# Patient Record
Sex: Female | Born: 1970
Health system: Southern US, Community
[De-identification: ages and names within clinical notes are randomized; demographics above are authoritative.]

## PROBLEM LIST (undated history)

## (undated) DIAGNOSIS — E785 Hyperlipidemia, unspecified: Secondary | ICD-10-CM

## (undated) DIAGNOSIS — G8929 Other chronic pain: Secondary | ICD-10-CM

## (undated) DIAGNOSIS — R000 Tachycardia, unspecified: Secondary | ICD-10-CM

## (undated) DIAGNOSIS — M549 Dorsalgia, unspecified: Secondary | ICD-10-CM

## (undated) HISTORY — DX: Other chronic pain: G89.29

## (undated) HISTORY — DX: Dorsalgia, unspecified: M54.9

## (undated) HISTORY — DX: Hyperlipidemia, unspecified: E78.5

---

## 2001-10-23 HISTORY — PX: BREAST EXCISIONAL BIOPSY: SUR124

## 2003-05-03 ENCOUNTER — Inpatient Hospital Stay (HOSPITAL_COMMUNITY): Admission: AD | Admit: 2003-05-03 | Discharge: 2003-05-03 | Payer: Self-pay | Admitting: Obstetrics and Gynecology

## 2003-12-17 LAB — HM COLONOSCOPY: HM Colonoscopy: ABNORMAL

## 2006-05-31 ENCOUNTER — Ambulatory Visit: Payer: Self-pay | Admitting: Physical Medicine & Rehabilitation

## 2006-05-31 ENCOUNTER — Encounter
Admission: RE | Admit: 2006-05-31 | Discharge: 2006-08-29 | Payer: Self-pay | Admitting: Physical Medicine & Rehabilitation

## 2006-08-03 ENCOUNTER — Ambulatory Visit: Payer: Self-pay | Admitting: Physical Medicine & Rehabilitation

## 2006-08-31 ENCOUNTER — Encounter
Admission: RE | Admit: 2006-08-31 | Discharge: 2006-11-29 | Payer: Self-pay | Admitting: Physical Medicine & Rehabilitation

## 2006-08-31 ENCOUNTER — Ambulatory Visit: Payer: Self-pay | Admitting: Physical Medicine & Rehabilitation

## 2006-09-04 ENCOUNTER — Encounter
Admission: RE | Admit: 2006-09-04 | Discharge: 2006-10-03 | Payer: Self-pay | Admitting: Physical Medicine & Rehabilitation

## 2006-10-04 ENCOUNTER — Encounter
Admission: RE | Admit: 2006-10-04 | Discharge: 2006-11-02 | Payer: Self-pay | Admitting: Physical Medicine & Rehabilitation

## 2006-11-07 ENCOUNTER — Encounter
Admission: RE | Admit: 2006-11-07 | Discharge: 2007-02-05 | Payer: Self-pay | Admitting: Physical Medicine & Rehabilitation

## 2006-11-20 ENCOUNTER — Ambulatory Visit: Payer: Self-pay | Admitting: Physical Medicine & Rehabilitation

## 2007-06-05 ENCOUNTER — Ambulatory Visit (HOSPITAL_COMMUNITY): Payer: Self-pay | Admitting: Psychiatry

## 2007-06-13 ENCOUNTER — Ambulatory Visit (HOSPITAL_COMMUNITY): Payer: Self-pay | Admitting: Licensed Clinical Social Worker

## 2007-06-19 ENCOUNTER — Ambulatory Visit (HOSPITAL_COMMUNITY): Payer: Self-pay | Admitting: Psychiatry

## 2007-07-08 ENCOUNTER — Ambulatory Visit (HOSPITAL_COMMUNITY): Payer: Self-pay | Admitting: Licensed Clinical Social Worker

## 2007-07-22 ENCOUNTER — Ambulatory Visit (HOSPITAL_COMMUNITY): Payer: Self-pay | Admitting: Licensed Clinical Social Worker

## 2007-09-04 ENCOUNTER — Encounter: Admission: RE | Admit: 2007-09-04 | Discharge: 2007-09-04 | Payer: Self-pay | Admitting: Family Medicine

## 2008-10-22 ENCOUNTER — Encounter: Payer: Self-pay | Admitting: Family Medicine

## 2008-11-24 ENCOUNTER — Encounter: Payer: Self-pay | Admitting: Family Medicine

## 2008-11-25 ENCOUNTER — Ambulatory Visit: Payer: Self-pay | Admitting: Internal Medicine

## 2008-11-25 DIAGNOSIS — M25559 Pain in unspecified hip: Secondary | ICD-10-CM | POA: Insufficient documentation

## 2008-11-25 DIAGNOSIS — F17200 Nicotine dependence, unspecified, uncomplicated: Secondary | ICD-10-CM | POA: Insufficient documentation

## 2008-11-25 DIAGNOSIS — M5136 Other intervertebral disc degeneration, lumbar region: Secondary | ICD-10-CM | POA: Insufficient documentation

## 2008-11-25 DIAGNOSIS — M51369 Other intervertebral disc degeneration, lumbar region without mention of lumbar back pain or lower extremity pain: Secondary | ICD-10-CM | POA: Insufficient documentation

## 2008-11-27 DIAGNOSIS — E785 Hyperlipidemia, unspecified: Secondary | ICD-10-CM | POA: Insufficient documentation

## 2008-11-27 DIAGNOSIS — E119 Type 2 diabetes mellitus without complications: Secondary | ICD-10-CM | POA: Insufficient documentation

## 2008-12-03 ENCOUNTER — Telehealth: Payer: Self-pay | Admitting: Internal Medicine

## 2008-12-10 ENCOUNTER — Ambulatory Visit: Payer: Self-pay | Admitting: Family Medicine

## 2008-12-10 DIAGNOSIS — B351 Tinea unguium: Secondary | ICD-10-CM | POA: Insufficient documentation

## 2008-12-11 ENCOUNTER — Encounter: Payer: Self-pay | Admitting: Family Medicine

## 2008-12-11 ENCOUNTER — Encounter (INDEPENDENT_AMBULATORY_CARE_PROVIDER_SITE_OTHER): Payer: Self-pay | Admitting: *Deleted

## 2008-12-17 LAB — CONVERTED CEMR LAB

## 2008-12-18 ENCOUNTER — Telehealth (INDEPENDENT_AMBULATORY_CARE_PROVIDER_SITE_OTHER): Payer: Self-pay | Admitting: *Deleted

## 2008-12-24 ENCOUNTER — Encounter (INDEPENDENT_AMBULATORY_CARE_PROVIDER_SITE_OTHER): Payer: Self-pay | Admitting: *Deleted

## 2008-12-27 ENCOUNTER — Encounter: Admission: RE | Admit: 2008-12-27 | Discharge: 2008-12-27 | Payer: Self-pay | Admitting: Family Medicine

## 2008-12-28 ENCOUNTER — Telehealth (INDEPENDENT_AMBULATORY_CARE_PROVIDER_SITE_OTHER): Payer: Self-pay | Admitting: *Deleted

## 2009-01-01 ENCOUNTER — Ambulatory Visit: Payer: Self-pay | Admitting: Family Medicine

## 2009-01-05 ENCOUNTER — Encounter (INDEPENDENT_AMBULATORY_CARE_PROVIDER_SITE_OTHER): Payer: Self-pay | Admitting: *Deleted

## 2009-01-13 ENCOUNTER — Encounter (INDEPENDENT_AMBULATORY_CARE_PROVIDER_SITE_OTHER): Payer: Self-pay | Admitting: *Deleted

## 2009-02-03 ENCOUNTER — Other Ambulatory Visit: Admission: RE | Admit: 2009-02-03 | Discharge: 2009-02-03 | Payer: Self-pay | Admitting: Family Medicine

## 2009-02-03 ENCOUNTER — Ambulatory Visit: Payer: Self-pay | Admitting: Family Medicine

## 2009-02-03 ENCOUNTER — Encounter: Payer: Self-pay | Admitting: Family Medicine

## 2009-02-03 LAB — CONVERTED CEMR LAB
Alkaline Phosphatase: 45 units/L (ref 39–117)
Basophils Relative: 0.9 % (ref 0.0–3.0)
Bilirubin, Direct: 0.1 mg/dL (ref 0.0–0.3)
CO2: 28 meq/L (ref 19–32)
Creatinine, Ser: 0.8 mg/dL (ref 0.4–1.2)
Creatinine,U: 253.4 mg/dL
Direct LDL: 133.3 mg/dL
GFR calc Af Amer: 104 mL/min
GFR calc non Af Amer: 86 mL/min
HCT: 42.8 % (ref 36.0–46.0)
Hemoglobin: 14.6 g/dL (ref 12.0–15.0)
MCHC: 34.1 g/dL (ref 30.0–36.0)
MCV: 99.2 fL (ref 78.0–100.0)
Microalb, Ur: 1 mg/dL (ref 0.0–1.9)
Monocytes Absolute: 0.5 10*3/uL (ref 0.1–1.0)
Neutro Abs: 4.3 10*3/uL (ref 1.4–7.7)
Neutrophils Relative %: 64.2 % (ref 43.0–77.0)
RBC: 4.31 M/uL (ref 3.87–5.11)
RDW: 12.4 % (ref 11.5–14.6)
Sodium: 140 meq/L (ref 135–145)
Total Bilirubin: 0.7 mg/dL (ref 0.3–1.2)
Total CHOL/HDL Ratio: 3.4
Total Protein: 7.3 g/dL (ref 6.0–8.3)
Triglycerides: 68 mg/dL (ref 0–149)
VLDL: 14 mg/dL (ref 0–40)
WBC: 6.7 10*3/uL (ref 4.5–10.5)

## 2009-02-08 ENCOUNTER — Encounter (INDEPENDENT_AMBULATORY_CARE_PROVIDER_SITE_OTHER): Payer: Self-pay | Admitting: *Deleted

## 2009-02-11 ENCOUNTER — Encounter (INDEPENDENT_AMBULATORY_CARE_PROVIDER_SITE_OTHER): Payer: Self-pay | Admitting: *Deleted

## 2009-03-13 ENCOUNTER — Emergency Department (HOSPITAL_COMMUNITY): Admission: EM | Admit: 2009-03-13 | Discharge: 2009-03-13 | Payer: Self-pay | Admitting: Family Medicine

## 2009-03-15 ENCOUNTER — Ambulatory Visit: Payer: Self-pay | Admitting: Family Medicine

## 2009-03-15 DIAGNOSIS — IMO0001 Reserved for inherently not codable concepts without codable children: Secondary | ICD-10-CM | POA: Insufficient documentation

## 2009-03-17 ENCOUNTER — Encounter (INDEPENDENT_AMBULATORY_CARE_PROVIDER_SITE_OTHER): Payer: Self-pay | Admitting: *Deleted

## 2009-04-05 ENCOUNTER — Ambulatory Visit: Payer: Self-pay | Admitting: Family Medicine

## 2009-04-09 ENCOUNTER — Encounter: Payer: Self-pay | Admitting: Family Medicine

## 2009-04-21 ENCOUNTER — Telehealth (INDEPENDENT_AMBULATORY_CARE_PROVIDER_SITE_OTHER): Payer: Self-pay | Admitting: *Deleted

## 2009-04-21 ENCOUNTER — Emergency Department (HOSPITAL_COMMUNITY): Admission: EM | Admit: 2009-04-21 | Discharge: 2009-04-21 | Payer: Self-pay | Admitting: Family Medicine

## 2009-05-31 ENCOUNTER — Ambulatory Visit: Payer: Self-pay | Admitting: Family Medicine

## 2009-05-31 DIAGNOSIS — N943 Premenstrual tension syndrome: Secondary | ICD-10-CM | POA: Insufficient documentation

## 2009-06-14 ENCOUNTER — Ambulatory Visit: Payer: Self-pay | Admitting: Family Medicine

## 2009-06-17 LAB — CONVERTED CEMR LAB
Albumin: 4.1 g/dL (ref 3.5–5.2)
CO2: 27 meq/L (ref 19–32)
Calcium: 9.2 mg/dL (ref 8.4–10.5)
GFR calc non Af Amer: 80.32 mL/min (ref >60)
Hgb A1c MFr Bld: 5.7 % (ref 4.6–6.5)
Potassium: 4.7 meq/L (ref 3.5–5.1)
Sodium: 139 meq/L (ref 135–145)
Total Bilirubin: 0.6 mg/dL (ref 0.3–1.2)
Total CHOL/HDL Ratio: 3
Triglycerides: 66 mg/dL (ref 0.0–149.0)
VLDL: 13.2 mg/dL (ref 0.0–40.0)

## 2009-06-22 ENCOUNTER — Emergency Department (HOSPITAL_COMMUNITY): Admission: EM | Admit: 2009-06-22 | Discharge: 2009-06-22 | Payer: Self-pay | Admitting: Family Medicine

## 2009-06-22 ENCOUNTER — Telehealth (INDEPENDENT_AMBULATORY_CARE_PROVIDER_SITE_OTHER): Payer: Self-pay | Admitting: *Deleted

## 2009-07-13 ENCOUNTER — Telehealth: Payer: Self-pay | Admitting: Family Medicine

## 2009-07-26 ENCOUNTER — Telehealth: Payer: Self-pay | Admitting: Family Medicine

## 2009-07-28 ENCOUNTER — Telehealth (INDEPENDENT_AMBULATORY_CARE_PROVIDER_SITE_OTHER): Payer: Self-pay | Admitting: *Deleted

## 2009-08-02 ENCOUNTER — Ambulatory Visit: Payer: Self-pay | Admitting: Family Medicine

## 2009-08-24 ENCOUNTER — Encounter: Payer: Self-pay | Admitting: Family Medicine

## 2009-09-03 ENCOUNTER — Ambulatory Visit: Payer: Self-pay | Admitting: Family Medicine

## 2009-09-03 DIAGNOSIS — D219 Benign neoplasm of connective and other soft tissue, unspecified: Secondary | ICD-10-CM | POA: Insufficient documentation

## 2009-09-14 ENCOUNTER — Encounter: Payer: Self-pay | Admitting: Family Medicine

## 2009-09-22 ENCOUNTER — Telehealth: Payer: Self-pay | Admitting: Family Medicine

## 2009-09-22 DIAGNOSIS — H547 Unspecified visual loss: Secondary | ICD-10-CM | POA: Insufficient documentation

## 2009-09-24 ENCOUNTER — Encounter (INDEPENDENT_AMBULATORY_CARE_PROVIDER_SITE_OTHER): Payer: Self-pay | Admitting: *Deleted

## 2009-09-28 ENCOUNTER — Ambulatory Visit: Payer: Self-pay | Admitting: Family Medicine

## 2009-10-04 ENCOUNTER — Telehealth (INDEPENDENT_AMBULATORY_CARE_PROVIDER_SITE_OTHER): Payer: Self-pay | Admitting: *Deleted

## 2009-10-04 LAB — CONVERTED CEMR LAB
ALT: 11 units/L (ref 0–35)
AST: 16 units/L (ref 0–37)
Albumin: 3.8 g/dL (ref 3.5–5.2)
Alkaline Phosphatase: 44 units/L (ref 39–117)
BUN: 6 mg/dL (ref 6–23)
Bilirubin, Direct: 0 mg/dL (ref 0.0–0.3)
CO2: 25 meq/L (ref 19–32)
Calcium: 9.1 mg/dL (ref 8.4–10.5)
Cholesterol: 200 mg/dL (ref 0–200)
Creatinine, Ser: 1 mg/dL (ref 0.4–1.2)
GFR calc non Af Amer: 80.19 mL/min (ref >60)
HDL: 63.4 mg/dL (ref >39.00)
Potassium: 4.4 meq/L (ref 3.5–5.1)
Sodium: 140 meq/L (ref 135–145)
Total Protein: 7.1 g/dL (ref 6.0–8.3)
VLDL: 12.2 mg/dL (ref 0.0–40.0)

## 2009-11-01 ENCOUNTER — Encounter: Payer: Self-pay | Admitting: Family Medicine

## 2009-11-03 LAB — HM DIABETES EYE EXAM: HM Diabetic Eye Exam: NORMAL

## 2009-11-12 ENCOUNTER — Telehealth: Payer: Self-pay | Admitting: Family Medicine

## 2009-11-25 ENCOUNTER — Encounter: Payer: Self-pay | Admitting: Family Medicine

## 2009-11-30 ENCOUNTER — Encounter: Payer: Self-pay | Admitting: Family Medicine

## 2010-03-06 ENCOUNTER — Emergency Department (HOSPITAL_COMMUNITY): Admission: EM | Admit: 2010-03-06 | Discharge: 2010-03-06 | Payer: Self-pay | Admitting: Family Medicine

## 2010-03-10 ENCOUNTER — Ambulatory Visit: Payer: Self-pay | Admitting: Family Medicine

## 2010-03-11 LAB — CONVERTED CEMR LAB
ALT: 11 units/L (ref 0–35)
Albumin: 4 g/dL (ref 3.5–5.2)
Alkaline Phosphatase: 53 units/L (ref 39–117)
BUN: 8 mg/dL (ref 6–23)
Bilirubin, Direct: 0.1 mg/dL (ref 0.0–0.3)
CO2: 25 meq/L (ref 19–32)
Calcium: 9.3 mg/dL (ref 8.4–10.5)
Creatinine, Ser: 0.8 mg/dL (ref 0.4–1.2)
Glucose, Bld: 75 mg/dL (ref 70–99)
Microalb, Ur: 0.1 mg/dL (ref 0.0–1.9)
Triglycerides: 64 mg/dL (ref 0.0–149.0)

## 2010-03-21 ENCOUNTER — Emergency Department (HOSPITAL_COMMUNITY): Admission: EM | Admit: 2010-03-21 | Discharge: 2010-03-21 | Payer: Self-pay | Admitting: Family Medicine

## 2010-03-22 ENCOUNTER — Telehealth: Payer: Self-pay | Admitting: Family Medicine

## 2010-03-24 ENCOUNTER — Ambulatory Visit: Payer: Self-pay | Admitting: Family Medicine

## 2010-03-24 DIAGNOSIS — R350 Frequency of micturition: Secondary | ICD-10-CM | POA: Insufficient documentation

## 2010-03-24 DIAGNOSIS — B370 Candidal stomatitis: Secondary | ICD-10-CM | POA: Insufficient documentation

## 2010-03-24 DIAGNOSIS — J019 Acute sinusitis, unspecified: Secondary | ICD-10-CM | POA: Insufficient documentation

## 2010-03-24 LAB — CONVERTED CEMR LAB
Blood in Urine, dipstick: NEGATIVE
Ketones, urine, test strip: NEGATIVE
Nitrite: NEGATIVE
Protein, U semiquant: NEGATIVE
Urobilinogen, UA: 0.2
WBC Urine, dipstick: NEGATIVE
pH: 6.5

## 2010-05-31 ENCOUNTER — Ambulatory Visit: Payer: Self-pay | Admitting: Family Medicine

## 2010-05-31 DIAGNOSIS — R5383 Other fatigue: Secondary | ICD-10-CM

## 2010-05-31 DIAGNOSIS — R5381 Other malaise: Secondary | ICD-10-CM | POA: Insufficient documentation

## 2010-06-06 LAB — CONVERTED CEMR LAB
Basophils Absolute: 0.1 10*3/uL (ref 0.0–0.1)
Basophils Relative: 0.7 % (ref 0.0–3.0)
CO2: 22 meq/L (ref 19–32)
Chloride: 104 meq/L (ref 96–112)
Folate: 9.7 ng/mL
Free T4: 1.07 ng/dL (ref 0.60–1.60)
GFR calc non Af Amer: 81.78 mL/min (ref >60)
HCT: 42.5 % (ref 36.0–46.0)
Hemoglobin: 14.4 g/dL (ref 12.0–15.0)
Iron: 132 ug/dL (ref 42–145)
Monocytes Absolute: 0.4 10*3/uL (ref 0.1–1.0)
Platelets: 244 10*3/uL (ref 150.0–400.0)
RBC: 4.29 M/uL (ref 3.87–5.11)
Saturation Ratios: 42.3 % (ref 20.0–50.0)
Sodium: 139 meq/L (ref 135–145)
T3, Free: 2.8 pg/mL (ref 2.3–4.2)
TSH: 1.49 microintl units/mL (ref 0.35–5.50)
Vitamin B-12: 1281 pg/mL — ABNORMAL HIGH (ref 211–911)

## 2010-06-30 ENCOUNTER — Ambulatory Visit: Payer: Self-pay | Admitting: Family Medicine

## 2010-06-30 DIAGNOSIS — R142 Eructation: Secondary | ICD-10-CM

## 2010-06-30 DIAGNOSIS — R143 Flatulence: Secondary | ICD-10-CM

## 2010-06-30 DIAGNOSIS — R110 Nausea: Secondary | ICD-10-CM | POA: Insufficient documentation

## 2010-06-30 DIAGNOSIS — R141 Gas pain: Secondary | ICD-10-CM | POA: Insufficient documentation

## 2010-07-01 LAB — CONVERTED CEMR LAB
Albumin: 4.3 g/dL (ref 3.5–5.2)
Alkaline Phosphatase: 49 units/L (ref 39–117)
Basophils Relative: 0.2 % (ref 0.0–3.0)
Bilirubin, Direct: 0.1 mg/dL (ref 0.0–0.3)
Chloride: 107 meq/L (ref 96–112)
Creatinine, Ser: 0.8 mg/dL (ref 0.4–1.2)
Glucose, Bld: 89 mg/dL (ref 70–99)
HCT: 40.9 % (ref 36.0–46.0)
Hemoglobin: 13.9 g/dL (ref 12.0–15.0)
Lymphs Abs: 2.6 10*3/uL (ref 0.7–4.0)
Monocytes Relative: 5.3 % (ref 3.0–12.0)
Total Protein: 7.2 g/dL (ref 6.0–8.3)
WBC: 7.6 10*3/uL (ref 4.5–10.5)

## 2010-07-26 ENCOUNTER — Encounter: Payer: Self-pay | Admitting: Family Medicine

## 2010-08-12 ENCOUNTER — Encounter: Payer: Self-pay | Admitting: Family Medicine

## 2010-08-12 ENCOUNTER — Ambulatory Visit (HOSPITAL_COMMUNITY): Admission: RE | Admit: 2010-08-12 | Discharge: 2010-08-12 | Payer: Self-pay | Admitting: Gastroenterology

## 2010-08-16 ENCOUNTER — Encounter: Payer: Self-pay | Admitting: Family Medicine

## 2010-08-22 ENCOUNTER — Ambulatory Visit: Payer: Self-pay | Admitting: Family Medicine

## 2010-08-22 DIAGNOSIS — K449 Diaphragmatic hernia without obstruction or gangrene: Secondary | ICD-10-CM | POA: Insufficient documentation

## 2010-08-22 DIAGNOSIS — D126 Benign neoplasm of colon, unspecified: Secondary | ICD-10-CM | POA: Insufficient documentation

## 2010-08-22 DIAGNOSIS — R030 Elevated blood-pressure reading, without diagnosis of hypertension: Secondary | ICD-10-CM | POA: Insufficient documentation

## 2010-10-27 ENCOUNTER — Encounter: Payer: Self-pay | Admitting: Family Medicine

## 2010-10-27 ENCOUNTER — Ambulatory Visit
Admission: RE | Admit: 2010-10-27 | Discharge: 2010-10-27 | Payer: Self-pay | Source: Home / Self Care | Attending: Family Medicine | Admitting: Family Medicine

## 2010-10-31 ENCOUNTER — Other Ambulatory Visit
Admission: RE | Admit: 2010-10-31 | Discharge: 2010-10-31 | Payer: Self-pay | Source: Home / Self Care | Admitting: Family Medicine

## 2010-10-31 ENCOUNTER — Ambulatory Visit
Admission: RE | Admit: 2010-10-31 | Discharge: 2010-10-31 | Payer: Self-pay | Source: Home / Self Care | Attending: Family Medicine | Admitting: Family Medicine

## 2010-10-31 ENCOUNTER — Telehealth (INDEPENDENT_AMBULATORY_CARE_PROVIDER_SITE_OTHER): Payer: Self-pay | Admitting: *Deleted

## 2010-10-31 ENCOUNTER — Other Ambulatory Visit: Payer: Self-pay | Admitting: Family Medicine

## 2010-10-31 LAB — CBC WITH DIFFERENTIAL/PLATELET
Basophils Absolute: 0 10*3/uL (ref 0.0–0.1)
Basophils Relative: 0.3 % (ref 0.0–3.0)
Eosinophils Absolute: 0.2 10*3/uL (ref 0.0–0.7)
Eosinophils Relative: 2.3 % (ref 0.0–5.0)
HCT: 40.5 % (ref 36.0–46.0)
Hemoglobin: 13.6 g/dL (ref 12.0–15.0)
Lymphocytes Relative: 22.8 % (ref 12.0–46.0)
Lymphs Abs: 1.5 10*3/uL (ref 0.7–4.0)
MCHC: 33.6 g/dL (ref 30.0–36.0)
MCV: 98.8 fl (ref 78.0–100.0)
Monocytes Absolute: 0.3 10*3/uL (ref 0.1–1.0)
Monocytes Relative: 5.1 % (ref 3.0–12.0)
Neutro Abs: 4.6 10*3/uL (ref 1.4–7.7)
Neutrophils Relative %: 69.5 % (ref 43.0–77.0)
Platelets: 297 10*3/uL (ref 150.0–400.0)
RBC: 4.1 Mil/uL (ref 3.87–5.11)
RDW: 13.6 % (ref 11.5–14.6)
WBC: 6.6 10*3/uL (ref 4.5–10.5)

## 2010-10-31 LAB — LIPID PANEL
Cholesterol: 219 mg/dL — ABNORMAL HIGH (ref 0–200)
HDL: 59.4 mg/dL (ref >39.00)
Total CHOL/HDL Ratio: 4
Triglycerides: 62 mg/dL (ref 0.0–149.0)
VLDL: 12.4 mg/dL (ref 0.0–40.0)

## 2010-10-31 LAB — HEMOGLOBIN A1C: Hgb A1c MFr Bld: 6.2 % (ref 4.6–6.5)

## 2010-10-31 LAB — HEPATIC FUNCTION PANEL
ALT: 7 U/L (ref 0–35)
AST: 18 U/L (ref 0–37)
Albumin: 4 g/dL (ref 3.5–5.2)
Alkaline Phosphatase: 56 U/L (ref 39–117)
Bilirubin, Direct: 0.1 mg/dL (ref 0.0–0.3)
Total Bilirubin: 0.5 mg/dL (ref 0.3–1.2)
Total Protein: 7.3 g/dL (ref 6.0–8.3)

## 2010-10-31 LAB — BASIC METABOLIC PANEL
BUN: 8 mg/dL (ref 6–23)
CO2: 27 mEq/L (ref 19–32)
Calcium: 9.2 mg/dL (ref 8.4–10.5)
Chloride: 105 mEq/L (ref 96–112)
Creatinine, Ser: 0.9 mg/dL (ref 0.4–1.2)
GFR: 93.62 mL/min (ref >60.00)
Glucose, Bld: 98 mg/dL (ref 70–99)
Potassium: 4.9 mEq/L (ref 3.5–5.1)
Sodium: 140 mEq/L (ref 135–145)

## 2010-10-31 LAB — HM PAP SMEAR

## 2010-10-31 LAB — LDL CHOLESTEROL, DIRECT: Direct LDL: 153.8 mg/dL

## 2010-10-31 LAB — TSH: TSH: 1.16 u[IU]/mL (ref 0.35–5.50)

## 2010-10-31 LAB — HM DIABETES FOOT EXAM

## 2010-11-02 ENCOUNTER — Telehealth: Payer: Self-pay | Admitting: Family Medicine

## 2010-11-03 ENCOUNTER — Encounter: Payer: Self-pay | Admitting: Family Medicine

## 2010-11-08 ENCOUNTER — Encounter
Admission: RE | Admit: 2010-11-08 | Discharge: 2010-11-08 | Payer: Self-pay | Source: Home / Self Care | Attending: Family Medicine | Admitting: Family Medicine

## 2010-11-08 ENCOUNTER — Telehealth (INDEPENDENT_AMBULATORY_CARE_PROVIDER_SITE_OTHER): Payer: Self-pay | Admitting: *Deleted

## 2010-11-13 ENCOUNTER — Encounter: Payer: Self-pay | Admitting: Family Medicine

## 2010-11-18 ENCOUNTER — Telehealth: Payer: Self-pay | Admitting: Family Medicine

## 2010-11-22 NOTE — Assessment & Plan Note (Signed)
Summary: teeth hurting- pressure between eyes/cbs   Vital Signs:  Patient profile:   40 year old female Height:      67 inches Weight:      175 pounds Temp:     98.6 degrees F oral Pulse rate:   76 / minute BP sitting:   118 / 90  (left arm)  Vitals Entered By: Jeremy Johann CMA (May 31, 2010 8:29 AM) CC: pressure between eyes, teeth hurting, discuss mood, URI symptoms, Depressive symptoms, Headache   History of Present Illness:       This is a 40 year old woman who presents with URI symptoms.  The symptoms began 4-8 weeks ago.  Pt c/o sinus pressure and headache for about 6months since she started her job with [employer]--very dusty.  The patient complains of nasal congestion and clear nasal discharge, but denies purulent nasal discharge, sore throat, dry cough, productive cough, earache, and sick contacts.  The patient denies fever, low-grade fever (<100.5 degrees), fever of 100.5-103 degrees, fever of 103.1-104 degrees, fever to >104 degrees, stiff neck, dyspnea, wheezing, rash, vomiting, diarrhea, use of an antipyretic, and response to antipyretic.  The patient also reports headache.  The patient denies itchy watery eyes, itchy throat, sneezing, seasonal symptoms, response to antihistamine, muscle aches, and severe fatigue.  Risk factors for Strep sinusitis include tooth pain.  The patient denies the following risk factors for Strep sinusitis: unilateral facial pain, unilateral nasal discharge, poor response to decongestant, double sickening, Strep exposure, tender adenopathy, and absence of cough.  Pt is using veramyst and claritin.     Depressive symptoms      The patient also presents with Depressive symptoms.  The symptoms began 4 weeks ago.  Pt states symptoms start about2 weeks before period---she cries for no reason and then when period starts she is better but it never goes away.  Pt had an episode years ago 2003 and she was found to have a vita B12 def and then symptoms resolved.  The  patient reports depressed mood and hypersomnia, but denies loss of interest/pleasure, significant weight loss, significant weight gain, insomnia, psychomotor agitation, and psychomotor retardation.  The patient also reports fatigue or loss of energy.  The patient denies feelings of worthlessness, diminished concentration, indecisiveness, thoughts of death, thoughts of suicide, suicidal intent, and suicidal plans.  The patient reports the following psychosocial stressors: major life changes.  Patient's past history includes depression.  The patient denies abnormally elevated mood, abnormally irritable mood, decreased need for sleep, increased talkativeness, distractibility, flight of ideas, increased goal-directed activity, and inflated self-esteem/ grandiosity.  Pt states this is the best year of her life.  She has no reason to be depressed.    Current Medications (verified): 1)  Bugs Bunny Vitamins/minerals  Chew (Multiple Vitamins-Minerals) .... Take 1 Tablet By Mouth Once A Day 2)  Freestyle Test  Strp (Glucose Blood) .... Use To Test Blood Sugar Two Times A Day 3)  Freestyle Lancets  Misc (Lancets) .... Use To Test Blood Sugar Two Times A Day 4)  Vicodin Es 7.5-750 Mg Tabs (Hydrocodone-Acetaminophen) .Marland Kitchen.. 1 By Mouth Every 6 Hours As Needed 5)  Prilosec 20 Mg Cpdr (Omeprazole) .Marland Kitchen.. 1 By Mouth Once Daily 6)  Claritin 10 Mg Tabs (Loratadine) .Marland Kitchen.. 1 By Mouth Once Daily 7)  Veramyst 27.5 Mcg/spray Susp (Fluticasone Furoate) .... 2 Sprays Each Nostril Once Daily 8)  B Complex-B12  Tabs (B Complex Vitamins) .... Take 1 Tab Once Daily  Allergies (verified): No  Known Drug Allergies  Physical Exam  General:  Well-developed,well-nourished,in no acute distress; alert,appropriate and cooperative throughout examination Ears:  External ear exam shows no significant lesions or deformities.  Otoscopic examination reveals clear canals, tympanic membranes are intact bilaterally without bulging, retraction,  inflammation or discharge. Hearing is grossly normal bilaterally. Nose:  L frontal sinus tenderness, L maxillary sinus tenderness, R frontal sinus tenderness, and R maxillary sinus tenderness.   Mouth:  Oral mucosa and oropharynx without lesions or exudates.  Teeth in good repair. Neck:  No deformities, masses, or tenderness noted. Lungs:  Normal respiratory effort, chest expands symmetrically. Lungs are clear to auscultation, no crackles or wheezes. Heart:  Normal rate and regular rhythm. S1 and S2 normal without gallop, murmur, click, rub or other extra sounds. Psych:  Oriented X3, normally interactive, not anxious appearing, and depressed affect.     Impression & Recommendations:  Problem # 1:  PREMENSTRUAL DYSPHORIC SYNDROME (ICD-625.4)  Her updated medication list for this problem includes:    Vicodin Es 7.5-750 Mg Tabs (Hydrocodone-acetaminophen) .Marland Kitchen... 1 by mouth every 6 hours as needed  Orders: Venipuncture (16109) TLB-B12 + Folate Pnl (60454_09811-B14/NWG) TLB-BMP (Basic Metabolic Panel-BMET) (80048-METABOL) TLB-CBC Platelet - w/Differential (85025-CBCD) TLB-TSH (Thyroid Stimulating Hormone) (84443-TSH) TLB-IBC Pnl (Iron/FE;Transferrin) (83550-IBC) TLB-T3, Free (Triiodothyronine) (84481-T3FREE) TLB-T4 (Thyrox), Free [phone]) Specimen Handling (57846)  Headache diary reviewed.  Problem # 2:  SINUSITIS - ACUTE-NOS (ICD-461.9) ? viral--if symptoms worsen or persist--- consider abx vs CT  Her updated medication list for this problem includes:    Flonase 50 Mcg/act Susp (Fluticasone propionate) .Marland Kitchen... 2 sprays each nostril once daily  Instructed on treatment. Call if symptoms persist or worsen.   Complete Medication List: 1)  Bugs Bunny Vitamins/minerals Chew (Multiple vitamins-minerals) .... Take 1 tablet by mouth once a day 2)  Freestyle Test Strp (Glucose blood) .... Use to test blood sugar two times a day 3)  Freestyle Lancets Misc (Lancets) .... Use to test blood  sugar two times a day 4)  Vicodin Es 7.5-750 Mg Tabs (Hydrocodone-acetaminophen) .Marland Kitchen.. 1 by mouth every 6 hours as needed 5)  Prilosec 20 Mg Cpdr (Omeprazole) .Marland Kitchen.. 1 by mouth once daily 6)  Zyrtec Allergy 10 Mg Tabs (Cetirizine hcl) .Marland Kitchen.. 1 by mouth once daily 7)  Flonase 50 Mcg/act Susp (Fluticasone propionate) .... 2 sprays each nostril once daily 8)  B Complex-b12 Tabs (B complex vitamins) .... Take 1 tab once daily 9)  Prozac 10 Mg Caps (Fluoxetine hcl) .Marland Kitchen.. 1po once daily  Patient Instructions: 1)  Please schedule a follow-up appointment in 1 month.  Prescriptions: FLONASE 50 MCG/ACT SUSP (FLUTICASONE PROPIONATE) 2 sprays each nostril once daily  #1 x 3   Entered and Authorized by:   Loreen Freud DO   Signed by:   Loreen Freud DO on 05/31/2010   Method used:   Electronically to        [facility] (retail)       [address]       [place], [place]  [address]       Ph: [phone]       Fax: [phone]   RxID:   [id number] PROZAC 10 MG CAPS (FLUOXETINE HCL) 1po once daily  #30 x 2   Entered and Authorized by:   Loreen Freud DO   Signed by:   Loreen Freud DO on 05/31/2010   Method used:   Electronically to        [facility] (retail)  [address]       [place], [place]  [address]       Ph: [phone]       Fax: [phone]   RxID:   [id number]

## 2010-11-22 NOTE — Progress Notes (Signed)
Summary: refill med  Phone Note Call from Patient Call back at Work Phone [phone]   Caller: Patient Summary of Call: pt states that she had palpitation from the advair sample she was given. pt was seen in  [facility] yesterday and advise by physician there that chest discomfort she is experience is not related to her allergy. UC physician recommend rx prilosec. Pt would like refill of this med. Pt uses [facility]. MED removed 03-10-10.....Marland KitchenMarland KitchenFelecia Deloach CMA  Mar 22, 2010 9:39 AM    Follow-up for Phone Call        prilosec 10 mg #30  1 by mouth once daily 11 refills Follow-up by: Loreen Freud DO,  Mar 22, 2010 9:50 AM  Additional Follow-up for Phone Call Additional follow up Details #1::        left message that rx was sent in to [facility] Army Fossa CMA  Mar 22, 2010 9:54 AM     New/Updated Medications: PRILOSEC 10 MG CPDR (OMEPRAZOLE) 1 by mouth daily. Prescriptions: PRILOSEC 10 MG CPDR (OMEPRAZOLE) 1 by mouth daily.  #30 x 11   Entered by:   Army Fossa CMA   Authorized by:   Loreen Freud DO   Signed by:   Army Fossa CMA on 03/22/2010   Method used:   Electronically to        [facility] (retail)       [address]       [place], [place]  [id number]       Ph: [phone]       Fax: [phone]   RxID:   [id number]

## 2010-11-22 NOTE — Procedures (Signed)
Summary: EGD/[facility]  EGD/[facility]   Imported By: Lanelle Bal 08/26/2010 08:33:30  _____________________________________________________________________  External Attachment:    Type:   Image     Comment:   External Document

## 2010-11-22 NOTE — Progress Notes (Signed)
Summary: FAILED OPHTHALMOLOGY REFERRAL  Phone Note Outgoing Call   Call placed by: Magdalen Spatz [facility],  November 12, 2009 2:39 PM Call placed to: Specialist Summary of Call: FYI....Marland KitchenMarland KitchenIN REF TO OPHTHALMOLOGY REFERRAL, PT HAD AN APPT TO DR. BEVIS/[facility] ON 10-12-09 @ 2PM FOR DECREASED VISUAL ACUITY & DIABETES, THEY STATE PT NOSHOWED FOR HER APPT & DID NOT RSC'D.  PT IS NOW WANTING AN APPT TO OPHTH FOR LASIX SURG ONLY. Initial call taken by: Magdalen Spatz [facility],  November 12, 2009 2:41 PM  Follow-up for Phone Call        pt is to see ophtho for her diabetes--- at least every year---that is why I set up appt-----INS does not pay for lasik and she did not mention that to me. Follow-up by: Loreen Freud DO,  November 12, 2009 2:45 PM  Additional Follow-up for Phone Call Additional follow up Details #1::        I LM FOR PT TO CALL ME TO INFORM HER OF ABOVE Magdalen Spatz [facility]  November 12, 2009 3:49 PM  I S/W PT & INFORMED HER OF ALL ABOVE.  PT AGREED TO RSC'D APPT FOR EYE EXAM, BUT W/[facility], WHO SHE IS ALREADY ESTABLISHED WITH.  HER APPT IS 11-17-2009 @ 7:40AM W/DR [employer].  Magdalen Spatz [facility]  November 15, 2009 12:41 PM

## 2010-11-22 NOTE — Consult Note (Signed)
Summary: [facility]  [facility]   Imported By: Lanelle Bal 11/22/2009 11:47:17  _____________________________________________________________________  External Attachment:    Type:   Image     Comment:   External Document

## 2010-11-22 NOTE — Medication Information (Signed)
Summary: Formulary Letter Regarding Nicotrol/[facility]  Formulary Letter Regarding Nicotrol/[facility]   Imported By: Lanelle Bal 12/22/2009 13:35:03  _____________________________________________________________________  External Attachment:    Type:   Image     Comment:   External Document

## 2010-11-22 NOTE — Assessment & Plan Note (Signed)
Summary: has blood pressure issues///sph   Vital Signs:  Patient profile:   40 year old female Weight:      162.8 pounds BMI:     25.59 Temp:     98.3 degrees F oral BP sitting:   122 / 80  (left arm) Cuff size:   regular  Vitals Entered By: Almeta Monas CMA Duncan Dull) (August 22, 2010 11:17 AM) CC: bp check--says it was running high for a few days but she was not feeling well.   History of Present Illness: Pt here f/u bp-- It was running high with Dr Loreta Ave but she was not feeling well.  Pt doing better now. She was dx with polyps and hiatal hernia.  Pt is also going to see a surgeon because her GB has been acting up and Dr Loreta Ave has set her up with a Careers adviser.,  Current Medications (verified): 1)  Bugs Bunny Vitamins/minerals  Chew (Multiple Vitamins-Minerals) .... Take 1 Tablet By Mouth Once A Day 2)  Freestyle Test  Strp (Glucose Blood) .... Use To Test Blood Sugar Two Times A Day 3)  Freestyle Lancets  Misc (Lancets) .... Use To Test Blood Sugar Two Times A Day 4)  Vicodin Es 7.5-750 Mg Tabs (Hydrocodone-Acetaminophen) .Marland Kitchen.. 1 By Mouth Every 6 Hours As Needed 5)  Aciphex 20 Mg Tbec (Rabeprazole Sodium) .Marland Kitchen.. 1 By Mouth Once Daily 6)  Zyrtec Allergy 10 Mg Tabs (Cetirizine Hcl) .Marland Kitchen.. 1 By Mouth Once Daily 7)  Flonase 50 Mcg/act Susp (Fluticasone Propionate) .... 2 Sprays Each Nostril Once Daily 8)  B Complex-B12  Tabs (B Complex Vitamins) .... Take 1 Tab Once Daily 9)  Prozac 10 Mg Caps (Fluoxetine Hcl) .Marland Kitchen.. 1po Once Daily 10)  Promethazine Hcl 25 Mg Tabs (Promethazine Hcl) .Marland Kitchen.. 1 By Mouth Qid As Needed 11)  Align 4 Mg Caps (Probiotic Product) .Marland Kitchen.. 1 By Mouth Once Daily  Allergies (verified): No Known Drug Allergies  Past History:  Past Medical History: Last updated: 12/10/2008 Diabetes mellitus, type II Hyperlipidemia Chronic pain mgt -    Right sacroiliac arthropathy.   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 Family History Breast cancer 1st degree relative <50 Family History of Colon CA 1st degree relative <60 Family History Diabetes 1st degree relative  Social History: Reviewed history from 11/25/2008 and no changes required. Single Occupation:  Disabled from [employer] - back and leg pain Current Smoker Drug use-no Oringinally from [place] ([place])  Review of Systems      See HPI  Physical Exam  General:  Well-developed,well-nourished,in no acute distress; alert,appropriate and cooperative throughout examination Neck:  No deformities, masses, or tenderness noted. Lungs:  Normal respiratory effort, chest expands symmetrically. Lungs are clear to auscultation, no crackles or wheezes. Heart:  Normal rate and regular rhythm. S1 and S2 normal without gallop, murmur, click, rub or other extra sounds. Extremities:  No clubbing, cyanosis,  edema, or deformity noted with normal full range of motion of all joints.   Psych:  Cognition and judgment appear intact. Alert and cooperative with normal attention span and concentration. No apparent delusions, illusions, hallucinations   Impression & Recommendations:  Problem # 1:  ELEVATED BP READING WITHOUT DX HYPERTENSION (ICD-796.2)  BP today:  122/80 Prior BP: 126/90 (06/30/2010)  Labs Reviewed: Creat: 0.8 (06/30/2010) Chol: 192 (03/10/2010)   HDL: 61.00 (03/10/2010)   LDL: 118 (03/10/2010)   TG: 64.0 (03/10/2010)  Instructed in low sodium diet (DASH Handout) and behavior modification.    Complete Medication List: 1)  Bugs Bunny Vitamins/minerals Chew (Multiple vitamins-minerals) .... Take 1 tablet by mouth once a day 2)  Freestyle Test Strp (Glucose blood) .... Use to test blood sugar two times a day 3)  Freestyle Lancets Misc (Lancets) .... Use to test blood sugar two times a day 4)  Vicodin Es 7.5-750 Mg Tabs (Hydrocodone-acetaminophen) .Marland Kitchen.. 1 by mouth every 6 hours as needed 5)  Aciphex 20 Mg Tbec (Rabeprazole sodium) .Marland Kitchen.. 1 by mouth once daily 6)  Zyrtec Allergy 10 Mg Tabs (Cetirizine hcl) .Marland Kitchen.. 1 by mouth once daily 7)  Flonase 50 Mcg/act Susp (Fluticasone propionate) .... 2 sprays each nostril once daily 8)  B Complex-b12 Tabs (B complex vitamins) .... Take 1 tab once daily 9)  Prozac 10 Mg Caps (Fluoxetine hcl) .Marland Kitchen.. 1po once daily 10)  Promethazine Hcl 25 Mg Tabs (Promethazine hcl) .Marland Kitchen.. 1 by mouth qid as needed 11)  Align 4 Mg Caps (Probiotic product) .Marland Kitchen.. 1 by mouth once daily   Orders Added: 1)  Est. Patient Level III [16109]

## 2010-11-22 NOTE — Consult Note (Signed)
Summary: [facility]  [facility]   Imported By: Lanelle Bal 08/16/2010 12:05:48  _____________________________________________________________________  External Attachment:    Type:   Image     Comment:   External Document

## 2010-11-22 NOTE — Assessment & Plan Note (Signed)
Summary: cough/earache/cbs   Vital Signs:  Patient profile:   40 year old female Height:      67 inches Weight:      178 pounds BMI:     27.98 Temp:     98.3 degrees F oral BP sitting:   126 / 80  (left arm) Cuff size:   regular  Vitals Entered By: Army Fossa CMA (Mar 10, 2010 10:39 AM) CC: Pt c/o being very tired, chest congestion, ear pain. x 1 week. has been taking claritin, and ATB(unsure of  name) Pt is fasting, URI symptoms   History of Present Illness:       This is a 40 year old woman who presents with URI symptoms.  The symptoms began 1 week ago.  Pt here c/o congestion.  She went to [facility]--no abx given------ pt had z pack at home and started taken it.   She is better.  The patient complains of nasal congestion, purulent nasal discharge, sore throat, productive cough, earache, and sick contacts.  The patient denies fever, low-grade fever (<100.5 degrees), fever of 100.5-103 degrees, fever of 103.1-104 degrees, fever to >104 degrees, stiff neck, dyspnea, wheezing, rash, vomiting, diarrhea, use of an antipyretic, and response to antipyretic.  The patient denies itchy watery eyes, itchy throat, sneezing, seasonal symptoms, response to antihistamine, headache, muscle aches, and severe fatigue.  Risk factors for Strep sinusitis include tooth pain.  The patient denies the following risk factors for Strep sinusitis: unilateral facial pain, unilateral nasal discharge, poor response to decongestant, double sickening, Strep exposure, tender adenopathy, and absence of cough.    Preventive Screening-Counseling & Management  Alcohol-Tobacco     Alcohol drinks/day: 0     Alcohol type: beer,wine,liquor     Smoking Status: current     Smoking Cessation Counseling: yes     Smoke Cessation Stage: ready     Packs/Day: 1.0     Year Started: 1994     Pack years: 16     Passive Smoke Exposure: yes  Caffeine-Diet-Exercise     Caffeine use/day: 3     Caffeine Counseling: decrease  use of caffeine     Does Patient Exercise: yes     Type of exercise: gym     Exercise (avg: min/session): 30-60     Times/week: 3  Current Medications (verified): 1)  Bugs Bunny Vitamins/minerals  Chew (Multiple Vitamins-Minerals) .... Take 1 Tablet By Mouth Once A Day 2)  Freestyle Test  Strp (Glucose Blood) .... Use To Test Blood Sugar Two Times A Day 3)  Freestyle Lancets  Misc (Lancets) .... Use To Test Blood Sugar Two Times A Day 4)  Vicodin Es 7.5-750 Mg Tabs (Hydrocodone-Acetaminophen) .Marland Kitchen.. 1 By Mouth Every 6 Hours As Needed 5)  Nicotrol 10 Mg Inha (Nicotine) .... Up To 16 Cart. A Day 6)  Crestor 20 Mg Tabs (Rosuvastatin Calcium) .Marland Kitchen.. 1 By Mouth Daily. 7)  Zithromax Z-Pak 250 Mg Tabs (Azithromycin) 8)  Advair Diskus 250-50 Mcg/dose Aepb (Fluticasone-Salmeterol) .Marland Kitchen.. 1 Inh Two Times A Day  Allergies (verified): No Known Drug Allergies  Past History:  Past medical, surgical, family and social histories (including risk factors) reviewed for relevance to current acute and chronic problems.  Past Medical History: Reviewed history from 12/10/2008 and no changes required. Diabetes mellitus, type II Hyperlipidemia Chronic pain mgt -    Right sacroiliac arthropathy.   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 chronic back pain  Past Surgical History: Reviewed history from 12/10/2008 and no changes  required. Denies surgical history  Family History: Reviewed history from 12/10/2008 and no changes required. Family History Breast cancer 1st degree relative <50 Family History of Colon CA 1st degree relative <60 Family History Diabetes 1st degree relative  Social History: Reviewed history from 11/25/2008 and no changes required. Single Occupation:  Disabled from [employer] - back and leg pain Current Smoker Drug use-no Oringinally from [place] ([place])  Review of Systems      See HPI  Physical Exam  General:  Well-developed,well-nourished,in no acute distress;  alert,appropriate and cooperative throughout examination Lungs:  R decreased breath sounds and L decreased breath sounds.   Heart:  normal rate and no murmur.   Psych:  Cognition and judgment appear intact. Alert and cooperative with normal attention span and concentration. No apparent delusions, illusions, hallucinations   Impression & Recommendations:  Problem # 1:  BRONCHITIS- ACUTE (ICD-466.0)  Take antibiotics and other medications as directed. Encouraged to push clear liquids, get enough rest, and take acetaminophen as needed. To be seen in 5-7 days if no improvement, sooner if worse.  Her updated medication list for this problem includes:    Zithromax Z-pak 250 Mg Tabs (Azithromycin)    Advair Diskus 250-50 Mcg/dose Aepb (Fluticasone-salmeterol) .Marland Kitchen... 1 inh two times a day  Complete Medication List: 1)  Bugs Bunny Vitamins/minerals Chew (Multiple vitamins-minerals) .... Take 1 tablet by mouth once a day 2)  Freestyle Test Strp (Glucose blood) .... Use to test blood sugar two times a day 3)  Freestyle Lancets Misc (Lancets) .... Use to test blood sugar two times a day 4)  Vicodin Es 7.5-750 Mg Tabs (Hydrocodone-acetaminophen) .Marland Kitchen.. 1 by mouth every 6 hours as needed 5)  Nicotrol 10 Mg Inha (Nicotine) .... Up to 16 cart. a day 6)  Crestor 20 Mg Tabs (Rosuvastatin calcium) .Marland Kitchen.. 1 by mouth daily. 7)  Zithromax Z-pak 250 Mg Tabs (Azithromycin) 8)  Advair Diskus 250-50 Mcg/dose Aepb (Fluticasone-salmeterol) .Marland Kitchen.. 1 inh two times a day  Other Orders: Venipuncture (16967) TLB-Lipid Panel (80061-LIPID) TLB-BMP (Basic Metabolic Panel-BMET) (80048-METABOL) TLB-Hepatic/Liver Function Pnl (80076-HEPATIC) TLB-A1C / Hgb A1C (Glycohemoglobin) (83036-A1C) TLB-Microalbumin/Creat Ratio, Urine (82043-MALB)

## 2010-11-22 NOTE — Progress Notes (Signed)
Summary: med verification  Phone Note From Pharmacy Call back at [phone]   Summary of Call: Pharmacy called to verify that you want her to be on 10 mg of Prilosec. Please advise. Army Fossa CMA  Mar 22, 2010 11:51 AM   Follow-up for Phone Call        thats what she was on before according to our records--- double check with pt or with pharmacy Follow-up by: Loreen Freud DO,  Mar 22, 2010 12:01 PM  Additional Follow-up for Phone Call Additional follow up Details #1::        left message for pt to call back. Army Fossa CMA  Mar 22, 2010 12:31 PM     Additional Follow-up for Phone Call Additional follow up Details #2::    Patient called back and said she was on 20 mg of prilosec. Follow-up by: Harold Barban,  Mar 22, 2010 1:04 PM  Additional Follow-up for Phone Call Additional follow up Details #3:: Details for Additional Follow-up Action Taken: ok to change to 20 mg Additional Follow-up by: Loreen Freud DO,  Mar 22, 2010 1:10 PM  New/Updated Medications: PRILOSEC 20 MG CPDR (OMEPRAZOLE) 1 by mouth once daily Prescriptions: PRILOSEC 20 MG CPDR (OMEPRAZOLE) 1 by mouth once daily  #30 x 11   Entered and Authorized by:   Loreen Freud DO   Signed by:   Loreen Freud DO on 03/22/2010   Method used:   Electronically to        [facility] (retail)       [address]       [place], [place]  [id number]       Ph: [phone]       Fax: [phone]   RxID:   [id number]

## 2010-11-22 NOTE — Procedures (Signed)
Summary: Colonoscopy/[facility]  Colonoscopy/[facility]   Imported By: Lanelle Bal 08/26/2010 08:34:30  _____________________________________________________________________  External Attachment:    Type:   Image     Comment:   External Document

## 2010-11-22 NOTE — Letter (Signed)
Summary: [facility]  [facility]   Imported By: Lanelle Bal 12/01/2009 10:57:33  _____________________________________________________________________  External Attachment:    Type:   Image     Comment:   External Document  Appended Document: [facility]    Clinical Lists Changes  Observations: Added new observation of TDBOOSTDUE: 02/04/2019 (12/01/2009 14:03) Added new observation of HDLNXTDUE: 09/28/2014 (12/01/2009 14:03) Added new observation of LDLNXTDUE: 09/28/2014 (12/01/2009 14:03) Added new observation of COLONNXTDUE: 12/16/2013 (12/01/2009 14:03) Added new observation of PAP DUE: 02/03/2010 (12/01/2009 14:03) Added new observation of MAMMO DUE: 08/21/2009 (12/01/2009 14:03) Added new observation of DB FT EX DUE: 02/03/2010 (12/01/2009 14:03) Added new observation of HGBA1CNXTDUE: 12/27/2009 (12/01/2009 14:03) Added new observation of MICRALB DUE: 12/10/2009 (12/01/2009 14:03) Added new observation of CREATNXTDUE: 09/28/2010 (12/01/2009 14:03) Added new observation of POTASSIUMDUE: 09/28/2010 (12/01/2009 14:03)

## 2010-11-22 NOTE — Assessment & Plan Note (Signed)
Summary: bloating-gas/cbs   Vital Signs:  Patient profile:   40 year old female LMP:     06/09/2010 Weight:      170.0 pounds Temp:     98.5 degrees F oral Pulse rate:   92 / minute Pulse rhythm:   regular BP sitting:   126 / 90  (left arm)  Vitals Entered By: Almeta Monas CMA Duncan Dull) (June 30, 2010 2:09 PM) CC: c/o bloating, gas, nauseated, Abdominal Pain LMP (date): 06/09/2010 LMP - Character: normal Menarche (age onset years): 13   Menses interval (days): 28 Menstrual flow (days): 4 Enter LMP: 06/09/2010 Last PAP Result NEGATIVE FOR INTRAEPITHELIAL LESIONS OR MALIGNANCY.   History of Present Illness:       This is a 40 year old woman who presents with Abdominal Pain.  The symptoms began 4 weeks ago.  Pt here c/o loose stools and increased bloating and gas. Worse with spicy, acidic foods and carbonation.   .  The patient denies nausea, vomiting, diarrhea, constipation, melena, hematochezia, anorexia, and hematemesis.  The location of the pain is diffuse.  The pain is described as intermittent and cramping in quality.  The patient denies the following symptoms: fever, weight loss, dysuria, chest pain, jaundice, dark urine, missed menstrual period, and vaginal bleeding.  The pain is worse with food.  The pain is better with antacids.  Sometimes pain radiates to shoulderblades.  Preventive Screening-Counseling & Management      Sexual History:  same sex encounters.    Current Medications (verified): 1)  Bugs Bunny Vitamins/minerals  Chew (Multiple Vitamins-Minerals) .... Take 1 Tablet By Mouth Once A Day 2)  Freestyle Test  Strp (Glucose Blood) .... Use To Test Blood Sugar Two Times A Day 3)  Freestyle Lancets  Misc (Lancets) .... Use To Test Blood Sugar Two Times A Day 4)  Vicodin Es 7.5-750 Mg Tabs (Hydrocodone-Acetaminophen) .Marland Kitchen.. 1 By Mouth Every 6 Hours As Needed 5)  Prilosec 20 Mg Cpdr (Omeprazole) .Marland Kitchen.. 1 By Mouth Once Daily 6)  Zyrtec Allergy 10 Mg Tabs (Cetirizine  Hcl) .Marland Kitchen.. 1 By Mouth Once Daily 7)  Flonase 50 Mcg/act Susp (Fluticasone Propionate) .... 2 Sprays Each Nostril Once Daily 8)  B Complex-B12  Tabs (B Complex Vitamins) .... Take 1 Tab Once Daily 9)  Prozac 10 Mg Caps (Fluoxetine Hcl) .Marland Kitchen.. 1po Once Daily 10)  Promethazine Hcl 25 Mg Tabs (Promethazine Hcl) .Marland Kitchen.. 1 By Mouth Qid As Needed  Allergies (verified): No Known Drug Allergies  Past History:  Past medical, surgical, family and social histories (including risk factors) reviewed for relevance to current acute and chronic problems.  Past Medical History: Reviewed history from 12/10/2008 and no changes required. Diabetes mellitus, type II Hyperlipidemia Chronic pain mgt -    Right sacroiliac arthropathy.   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 chronic back pain  Past Surgical History: Reviewed history from 12/10/2008 and no changes required. Denies surgical history  Family History: Reviewed history from 12/10/2008 and no changes required. Family History Breast cancer 1st degree relative <50 Family History of Colon CA 1st degree relative <60 Family History Diabetes 1st degree relative  Social History: Reviewed history from 11/25/2008 and no changes required. Single Occupation:  Disabled from [employer] - back and leg pain Current Smoker Drug use-no Oringinally from [employer] ([place])Sexual History:  same sex encounters  Review of Systems      See HPI  Physical Exam  General:  Well-developed,well-nourished,in no acute distress; alert,appropriate and cooperative throughout examination Lungs:  Normal  respiratory effort, chest expands symmetrically. Lungs are clear to auscultation, no crackles or wheezes. Abdomen:  Bowel sounds positive,abdomen soft and non-tender without masses, organomegaly or hernias noted. Psych:  Oriented X3 and normally interactive.     Impression & Recommendations:  Problem # 1:  ABDOMINAL BLOATING (ICD-787.3)  Orders: Venipuncture  (16109) TLB-BMP (Basic Metabolic Panel-BMET) (80048-METABOL) TLB-CBC Platelet - w/Differential (85025-CBCD) TLB-Hepatic/Liver Function Pnl (80076-HEPATIC) TLB-TSH (Thyroid Stimulating Hormone) (84443-TSH) TLB-Amylase (82150-AMYL) TLB-Lipase (83690-LIPASE) Gastroenterology Referral (GI) Specimen Handling (60454)  Problem # 2:  NAUSEA (ICD-787.02)  Her updated medication list for this problem includes:    Zyrtec Allergy 10 Mg Tabs (Cetirizine hcl) .Marland Kitchen... 1 by mouth once daily    Promethazine Hcl 25 Mg Tabs (Promethazine hcl) .Marland Kitchen... 1 by mouth qid as needed  Orders: Gastroenterology Referral (GI) Specimen Handling (09811)  Discussed symptom control.   Complete Medication List: 1)  Bugs Bunny Vitamins/minerals Chew (Multiple vitamins-minerals) .... Take 1 tablet by mouth once a day 2)  Freestyle Test Strp (Glucose blood) .... Use to test blood sugar two times a day 3)  Freestyle Lancets Misc (Lancets) .... Use to test blood sugar two times a day 4)  Vicodin Es 7.5-750 Mg Tabs (Hydrocodone-acetaminophen) .Marland Kitchen.. 1 by mouth every 6 hours as needed 5)  Aciphex 20 Mg Tbec (Rabeprazole sodium) .Marland Kitchen.. 1 by mouth once daily 6)  Zyrtec Allergy 10 Mg Tabs (Cetirizine hcl) .Marland Kitchen.. 1 by mouth once daily 7)  Flonase 50 Mcg/act Susp (Fluticasone propionate) .... 2 sprays each nostril once daily 8)  B Complex-b12 Tabs (B complex vitamins) .... Take 1 tab once daily 9)  Prozac 10 Mg Caps (Fluoxetine hcl) .Marland Kitchen.. 1po once daily 10)  Promethazine Hcl 25 Mg Tabs (Promethazine hcl) .Marland Kitchen.. 1 by mouth qid as needed 11)  Align 4 Mg Caps (Probiotic product) .Marland Kitchen.. 1 by mouth once daily  Patient Instructions: 1)  aciphex 1 by mouth once daily  2)  align as directed 3)  f/u GI Prescriptions: PROMETHAZINE HCL 25 MG TABS (PROMETHAZINE HCL) 1 by mouth qid as needed  #30 x 0   Entered and Authorized by:   Loreen Freud DO   Signed by:   Loreen Freud DO on 06/30/2010   Method used:   Electronically to        [facility] (retail)       [address]       [place], [place]  [address]       Ph: [phone]       Fax: [phone]   RxID:   [id number]

## 2010-11-22 NOTE — Assessment & Plan Note (Signed)
Summary: frequent urination/cbs   Vital Signs:  Patient profile:   40 year old female Weight:      173 pounds Pulse rate:   88 / minute Pulse rhythm:   regular BP sitting:   124 / 86  (left arm) Cuff size:   regular  Vitals Entered By: Army Fossa CMA (March 24, 2010 1:33 PM) CC: Pt here for frequent urination and states her head feels cloudy., Dysuria, URI symptoms   History of Present Illness:  Dysuria      This is a 40 year old woman who presents with Dysuria.  The patient complains of urinary frequency, but denies burning with urination, urgency, hematuria, vaginal discharge, vaginal itching, vaginal sores, and penile discharge.  The patient denies the following associated symptoms: nausea, vomiting, fever, shaking chills, flank pain, abdominal pain, back pain, pelvic pain, and arthralgias.  Risk factors for urinary tract infection include diabetes.  The patient denies the following risk factors: prior antibiotics, immunosuppression, history of GU anomaly, history of pyelonephritis, pregnancy, history of STD, and analgesic abuse.         The patient also presents with URI symptoms.  The symptoms began 1 week ago.  Pt has been taking claritin or allegra.  The patient complains of nasal congestion, earache, and sick contacts.  The patient denies fever, low-grade fever (<100.5 degrees), fever of 100.5-103 degrees, fever of 103.1-104 degrees, fever to >104 degrees, stiff neck, dyspnea, wheezing, rash, vomiting, diarrhea, use of an antipyretic, and response to antipyretic.  The patient also reports headache.  The patient denies the following risk factors for Strep sinusitis: unilateral facial pain, unilateral nasal discharge, poor response to decongestant, double sickening, tooth pain, Strep exposure, tender adenopathy, and absence of cough.    Current Medications (verified): 1)  Bugs Bunny Vitamins/minerals  Chew (Multiple Vitamins-Minerals) .... Take 1 Tablet By Mouth Once A Day 2)   Freestyle Test  Strp (Glucose Blood) .... Use To Test Blood Sugar Two Times A Day 3)  Freestyle Lancets  Misc (Lancets) .... Use To Test Blood Sugar Two Times A Day 4)  Vicodin Es 7.5-750 Mg Tabs (Hydrocodone-Acetaminophen) .Marland Kitchen.. 1 By Mouth Every 6 Hours As Needed 5)  Nicotrol 10 Mg Inha (Nicotine) .... Up To 16 Cart. A Day 6)  Crestor 20 Mg Tabs (Rosuvastatin Calcium) .Marland Kitchen.. 1 1/2  By Mouth Daily. 7)  Prilosec 20 Mg Cpdr (Omeprazole) .Marland Kitchen.. 1 By Mouth Once Daily 8)  Nystatin 100000 Unit/ml Susp (Nystatin) .... 5 Ml Swish and Spit Qid 9)  Levaquin 500 Mg Tabs (Levofloxacin) .Marland Kitchen.. 1 By Mouth Once Daily 10)  Claritin 10 Mg Tabs (Loratadine) .Marland Kitchen.. 1 By Mouth Once Daily 11)  Fluconazole 150 Mg Tabs (Fluconazole) .Marland Kitchen.. 1 By Mouth Once Daily X1  ,  May Repeat in 3 Days As Needed 12)  Veramyst 27.5 Mcg/spray Susp (Fluticasone Furoate) .... 2 Sprays Each Nostril Once Daily  Allergies (verified): No Known Drug Allergies  Past History:  Past medical, surgical, family and social histories (including risk factors) reviewed for relevance to current acute and chronic problems.  Past Medical History: Reviewed history from 12/10/2008 and no changes required. Diabetes mellitus, type II Hyperlipidemia Chronic pain mgt -    Right sacroiliac arthropathy.   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 chronic back pain  Past Surgical History: Reviewed history from 12/10/2008 and no changes required. Denies surgical history  Family History: Reviewed history from 12/10/2008 and no changes required. Family History Breast cancer 1st degree relative <50 Family History of  Colon CA 1st degree relative <60 Family History Diabetes 1st degree relative  Social History: Reviewed history from 11/25/2008 and no changes required. Single Occupation:  Disabled from [employer] - back and leg pain Current Smoker Drug use-no Oringinally from [place]  Review of Systems      See HPI  Physical Exam  General:   Well-developed,well-nourished,in no acute distress; alert,appropriate and cooperative throughout examination Ears:  External ear exam shows no significant lesions or deformities.  Otoscopic examination reveals clear canals, tympanic membranes are intact bilaterally without bulging, retraction, inflammation or discharge. Hearing is grossly normal bilaterally. Nose:  External nasal examination shows no deformity or inflammation. Nasal mucosa are pink and moist without lesions or exudates. Mouth:  pharynx pink and moist, halitosis, and white plaque(s) on tongue. Neck:  No deformities, masses, or tenderness noted. Lungs:  Normal respiratory effort, chest expands symmetrically. Lungs are clear to auscultation, no crackles or wheezes. Heart:  normal rate and no murmur.   Abdomen:  Bowel sounds positive,abdomen soft and non-tender without masses, organomegaly or hernias noted. Psych:  Oriented X3 and normally interactive.     Impression & Recommendations:  Problem # 1:  URINARY FREQUENCY (ICD-788.41)  Orders: T-Culture, Urine ([address]-60454) UA Dipstick w/o Micro (manual) (09811)  Discussed use of medication.   Problem # 2:  SINUSITIS - ACUTE-NOS (ICD-461.9)  The following medications were removed from the medication list:    Zithromax Z-pak 250 Mg Tabs (Azithromycin) Her updated medication list for this problem includes:    Levaquin 500 Mg Tabs (Levofloxacin) .Marland Kitchen... 1 by mouth once daily    Veramyst 27.5 Mcg/spray Susp (Fluticasone furoate) .Marland Kitchen... 2 sprays each nostril once daily  Instructed on treatment. Call if symptoms persist or worsen.   Orders: UA Dipstick w/o Micro (manual) (91478)  Problem # 3:  THRUSH (ICD-112.0)  nystatin swish and spit  Orders: UA Dipstick w/o Micro (manual) (29562)  Complete Medication List: 1)  Bugs Bunny Vitamins/minerals Chew (Multiple vitamins-minerals) .... Take 1 tablet by mouth once a day 2)  Freestyle Test Strp (Glucose blood) .... Use to test  blood sugar two times a day 3)  Freestyle Lancets Misc (Lancets) .... Use to test blood sugar two times a day 4)  Vicodin Es 7.5-750 Mg Tabs (Hydrocodone-acetaminophen) .Marland Kitchen.. 1 by mouth every 6 hours as needed 5)  Nicotrol 10 Mg Inha (Nicotine) .... Up to 16 cart. a day 6)  Crestor 20 Mg Tabs (Rosuvastatin calcium) .Marland Kitchen.. 1 1/2  by mouth daily. 7)  Prilosec 20 Mg Cpdr (Omeprazole) .Marland Kitchen.. 1 by mouth once daily 8)  Nystatin 100000 Unit/ml Susp (Nystatin) .... 5 ml swish and spit qid 9)  Levaquin 500 Mg Tabs (Levofloxacin) .Marland Kitchen.. 1 by mouth once daily 10)  Claritin 10 Mg Tabs (Loratadine) .Marland Kitchen.. 1 by mouth once daily 11)  Fluconazole 150 Mg Tabs (Fluconazole) .Marland Kitchen.. 1 by mouth once daily x1  ,  may repeat in 3 days as needed 12)  Veramyst 27.5 Mcg/spray Susp (Fluticasone furoate) .... 2 sprays each nostril once daily Prescriptions: FLUCONAZOLE 150 MG TABS (FLUCONAZOLE) 1 by mouth once daily x1  ,  may repeat in 3 days as needed  #2 x 2   Entered and Authorized by:   Loreen Freud DO   Signed by:   Loreen Freud DO on 03/24/2010   Method used:   Electronically to        [facility] (retail)       [address]  [place], [place]  [address]       Ph: [phone]       Fax: [phone]   RxID:   [id number] LEVAQUIN 500 MG TABS (LEVOFLOXACIN) 1 by mouth once daily  #10 x 0   Entered and Authorized by:   Loreen Freud DO   Signed by:   Loreen Freud DO on 03/24/2010   Method used:   Electronically to        [facility] (retail)       [address]       [place], [place]  [address]       Ph: [phone]       Fax: [phone]   RxID:   [id number] NYSTATIN 100000 UNIT/ML SUSP (NYSTATIN) 5 ml swish and spit qid  #150 ml x 0   Entered and Authorized by:   Loreen Freud DO   Signed by:   Loreen Freud DO on 03/24/2010   Method used:   Electronically to        [facility] (retail)       [address]        [place], [place]  [address]       Ph: [phone]       Fax: [phone]   RxID:   [id number]   Laboratory Results   Urine Tests    Routine Urinalysis   Color: lt. yellow Appearance: Clear Glucose: negative   (Normal Range: Negative) Bilirubin: negative   (Normal Range: Negative) Ketone: negative   (Normal Range: Negative) Spec. Gravity: 1.020   (Normal Range: 1.003-1.035) Blood: negative   (Normal Range: Negative) pH: 6.5   (Normal Range: 5.0-8.0) Protein: negative   (Normal Range: Negative) Urobilinogen: 0.2   (Normal Range: 0-1) Nitrite: negative   (Normal Range: Negative) Leukocyte Esterace: negative   (Normal Range: Negative)    Comments: Army Fossa CMA  March 24, 2010 1:39 PM

## 2010-11-24 NOTE — Progress Notes (Signed)
Summary: PSYCH REFERRAL  Phone Note Other Incoming   Summary of Call: IN REFERENCE TO PSYCHOLOGY REFERRAL........Marland KitchenPER FAX FROM JENNIFER/[facility], PT INFORMED HER SHE CANNOT AFFORED TO DO AT THIS TIME, SHE WILL CALL [facility] BACK IF SHE CHANGES HER MIND AT A LATER TIME Magdalen Spatz [facility]  November 18, 2010 9:29 AM

## 2010-11-24 NOTE — Progress Notes (Signed)
Summary: Refill Request  Phone Note Refill Request Call back at [phone] Message from:  Pharmacy on October 31, 2010 8:44 AM  Refills Requested: Medication #1:  Crestor 20mg  tab, take 1&1/2 tablets daily   Dosage confirmed as above?Dosage Confirmed   Supply Requested: [facility]  Next Appointment Scheduled: 1.9.12 Initial call taken by: Harold Barban,  October 31, 2010 8:45 AM  Follow-up for Phone Call        RX sent in by Dr.Lowne this a.m Follow-up by: Almeta Monas CMA Duncan Dull),  October 31, 2010 4:48 PM

## 2010-11-24 NOTE — Assessment & Plan Note (Signed)
Summary: FOR SINUS//PH   Vital Signs:  Patient profile:   40 year old female Weight:      164.6 pounds O2 Sat:      100 % on Room air Temp:     97.9 degrees F oral Pulse rate:   115 / minute Pulse rhythm:   regular BP sitting:   112 / 62  (left arm) Cuff size:   regular  Vitals Entered By: Almeta Monas CMA (AAMA) (October 27, 2010 10:05 AM)  O2 Flow:  Room air CC: x23mo c/o sinus headache, jaw pain, ear pain and sinus pressure---symptoms are off and on, URI symptoms   History of Present Illness:       This is a 40 year old woman who presents with URI symptoms.  The symptoms began 2 months ago.  Pt here c/o dry mouth ,  R ear pain and sinus pressure on and off.  The patient complains of sick contacts, but denies nasal congestion, clear nasal discharge, purulent nasal discharge, sore throat, dry cough, productive cough, and earache.  The patient denies fever, low-grade fever (<100.5 degrees), fever of 100.5-103 degrees, fever of 103.1-104 degrees, fever to >104 degrees, stiff neck, dyspnea, wheezing, rash, vomiting, diarrhea, use of an antipyretic, and response to antipyretic.  The patient also reports headache.  The patient denies itchy watery eyes, itchy throat, sneezing, seasonal symptoms, response to antihistamine, muscle aches, and severe fatigue.  Risk factors for Strep sinusitis include unilateral facial pain.  The patient denies the following risk factors for Strep sinusitis: unilateral nasal discharge, poor response to decongestant, double sickening, tooth pain, Strep exposure, tender adenopathy, and absence of cough.    Current Medications (verified): 1)  Bugs Bunny Vitamins/minerals  Chew (Multiple Vitamins-Minerals) .... Take 1 Tablet By Mouth Once A Day 2)  Freestyle Test  Strp (Glucose Blood) .... Use To Test Blood Sugar Two Times A Day 3)  Freestyle Lancets  Misc (Lancets) .... Use To Test Blood Sugar Two Times A Day 4)  Vicodin Es 7.5-750 Mg Tabs (Hydrocodone-Acetaminophen)  .Marland Kitchen.. 1 By Mouth Every 6 Hours As Needed 5)  Flonase 50 Mcg/act Susp (Fluticasone Propionate) .... 2 Sprays Each Nostril Once Daily 6)  Dexilant 60 Mg Cpdr (Dexlansoprazole) .... By Mouth Once Daily 7)  Phillps Pearls Probiotic .Marland Kitchen.. 1 By Mouth Every Other Day 8)  Augmentin 875-125 Mg Tabs (Amoxicillin-Pot Clavulanate) .Marland Kitchen.. 1 By Mouth Two Times A Day 9)  Astepro 0.15 % Soln (Azelastine Hcl) .... 2 Sprays Each Nostril Once Daily  Allergies (verified): No Known Drug Allergies  Past History:  Past medical, surgical, family and social histories (including risk factors) reviewed for relevance to current acute and chronic problems.  Past Medical History: Reviewed history from 12/10/2008 and no changes required. Diabetes mellitus, type II Hyperlipidemia Chronic pain mgt -    Right sacroiliac arthropathy.   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 chronic back pain  Past Surgical History: Reviewed history from 12/10/2008 and no changes required. Denies surgical history  Family History: Reviewed history from 12/10/2008 and no changes required. Family History Breast cancer 1st degree relative <50 Family History of Colon CA 1st degree relative <60 Family History Diabetes 1st degree relative  Social History: Reviewed history from 11/25/2008 and no changes required. Single Occupation:  Disabled from [employer] - back and leg pain Current Smoker Drug use-no Oringinally from [place]  Review of Systems      See HPI  Physical Exam  General:  Well-developed,well-nourished,in no acute distress; alert,appropriate  and cooperative throughout examination Ears:  External ear exam shows no significant lesions or deformities.  Otoscopic examination reveals clear canals, tympanic membranes are intact bilaterally without bulging, retraction, inflammation or discharge. Hearing is grossly normal bilaterally. Nose:  R frontal sinus tenderness and R maxillary sinus tenderness.   Mouth:  Oral  mucosa and oropharynx without lesions or exudates.  Teeth in good repair. Neck:  No deformities, masses, or tenderness noted. Lungs:  Normal respiratory effort, chest expands symmetrically. Lungs are clear to auscultation, no crackles or wheezes. Heart:  normal rate and no murmur.   Extremities:  No clubbing, cyanosis, edema, or deformity noted with normal full range of motion of all joints.     Impression & Recommendations:  Problem # 1:  SINUSITIS - ACUTE-NOS (ICD-461.9)  Her updated medication list for this problem includes:    Flonase 50 Mcg/act Susp (Fluticasone propionate) .Marland Kitchen... 2 sprays each nostril once daily    Augmentin 875-125 Mg Tabs (Amoxicillin-pot clavulanate) .Marland Kitchen... 1 by mouth two times a day    Astepro 0.15 % Soln (Azelastine hcl) .Marland Kitchen... 2 sprays each nostril once daily  Instructed on treatment. Call if symptoms persist or worsen.   Complete Medication List: 1)  Bugs Bunny Vitamins/minerals Chew (Multiple vitamins-minerals) .... Take 1 tablet by mouth once a day 2)  Freestyle Test Strp (Glucose blood) .... Use to test blood sugar two times a day 3)  Freestyle Lancets Misc (Lancets) .... Use to test blood sugar two times a day 4)  Vicodin Es 7.5-750 Mg Tabs (Hydrocodone-acetaminophen) .Marland Kitchen.. 1 by mouth every 6 hours as needed 5)  Flonase 50 Mcg/act Susp (Fluticasone propionate) .... 2 sprays each nostril once daily 6)  Dexilant 60 Mg Cpdr (Dexlansoprazole) .... By mouth once daily 7)  Phillps Pearls Probiotic  .Marland Kitchen.. 1 by mouth every other day 8)  Augmentin 875-125 Mg Tabs (Amoxicillin-pot clavulanate) .Marland Kitchen.. 1 by mouth two times a day 9)  Astepro 0.15 % Soln (Azelastine hcl) .... 2 sprays each nostril once daily Prescriptions: AUGMENTIN 875-125 MG TABS (AMOXICILLIN-POT CLAVULANATE) 1 by mouth two times a day  #20 x 0   Entered and Authorized by:   Loreen Freud DO   Signed by:   Loreen Freud DO on 10/27/2010   Method used:   Electronically to        [facility] (retail)       [address]       [place], [place]  [address]       Ph: [phone]       Fax: [phone]   RxID:   [id number]    Orders Added: 1)  Est. Patient Level III [24401]

## 2010-11-24 NOTE — Assessment & Plan Note (Signed)
Summary: cpx//pap//pt will be fasting//lch   Vital Signs:  Patient profile:   40 year old female Menstrual status:  regular LMP:     10/25/2010 Height:      67 inches Weight:      163.6 pounds Temp:     97.7 degrees F oral BP sitting:   124 / 80  (right arm) Cuff size:   regular  Vitals Entered By: Almeta Monas CMA Duncan Dull) (October 31, 2010 9:24 AM) CC: CPX/Fasting--No problems LMP (date): 10/25/2010 LMP - Character: normal Menarche (age onset years): 13   Menses interval (days): 28 Menstrual flow (days): 4 Menstrual Status regular Enter LMP: 10/25/2010 Last PAP Result NEGATIVE FOR INTRAEPITHELIAL LESIONS OR MALIGNANCY.   History of Present Illness: Pt here for cpe , labs and pap.   No complaints except she is having diarrhea with augmentin.     Preventive Screening-Counseling & Management  Alcohol-Tobacco     Alcohol drinks/day: 0     Alcohol type: beer,wine,liquor     Smoking Status: current     Smoking Cessation Counseling: yes     Smoke Cessation Stage: ready     Packs/Day: 1.0     Year Started: 1994     Pack years: 16     Passive Smoke Exposure: yes  Caffeine-Diet-Exercise     Caffeine use/day: 3     Caffeine Counseling: decrease use of caffeine     Does Patient Exercise: no     Exercise Counseling: to improve exercise regimen  Hep-HIV-STD-Contraception     Hepatitis Risk: risk noted     HIV Risk: risk noted     STD Risk: risk noted     Dental Visit-last 6 months yes     Dental Care Counseling: to seek dental care; no dental care within six months     Sun Exposure-Excessive: frequently  Safety-Violence-Falls     Seat Belt Use: 100      Sexual History:  same sex encounters.        Drug Use:  never.        Blood Transfusions:  no.    Problems Prior to Update: 1)  Elevated Bp Reading Without Dx Hypertension  (ICD-796.2) 2)  Colonic Polyps  (ICD-211.3) 3)  Hiatal Hernia  (ICD-553.3) 4)  Nausea  (ICD-787.02) 5)  Abdominal Bloating   (ICD-787.3) 6)  Sinusitis - Acute-nos  (ICD-461.9) 7)  Fatigue  (ICD-780.79) 8)  Thrush  (ICD-112.0) 9)  Sinusitis - Acute-nos  (ICD-461.9) 10)  Urinary Frequency  (ICD-788.41) 11)  Visual Acuity, Decreased  (ICD-369.9) 12)  Fibroids, Uterus  (ICD-218.9) 13)  Premenstrual Dysphoric Syndrome  (ICD-625.4) 14)  Myalgia  (ICD-729.1) 15)  Onychomycosis  (ICD-110.1) 16)  Preventive Health Care  (ICD-V70.0) 17)  Family History Diabetes 1st Degree Relative  (ICD-V18.0) 18)  Family History of Colon Ca 1st Degree Relative <60  (ICD-V16.0) 19)  Family History Breast Cancer 1st Degree Relative <50  (ICD-V16.3) 20)  Low Back Pain, Chronic  (ICD-724.2) 21)  Tobacco Abuse  (ICD-305.1) 22)  Hyperlipidemia  (ICD-272.4) 23)  Diabetes Mellitus, Type II  (ICD-250.00) 24)  Hip Pain, Right  (ICD-719.45) 25)  Back Pain, Lumbar, Chronic  (ICD-724.2)  Medications Prior to Update: 1)  Bugs Bunny Vitamins/minerals  Chew (Multiple Vitamins-Minerals) .... Take 1 Tablet By Mouth Once A Day 2)  Freestyle Test  Strp (Glucose Blood) .... Use To Test Blood Sugar Two Times A Day 3)  Freestyle Lancets  Misc (Lancets) .... Use To Test Blood Sugar  Two Times A Day 4)  Vicodin Es 7.5-750 Mg Tabs (Hydrocodone-Acetaminophen) .Marland Kitchen.. 1 By Mouth Every 6 Hours As Needed 5)  Flonase 50 Mcg/act Susp (Fluticasone Propionate) .... 2 Sprays Each Nostril Once Daily 6)  Dexilant 60 Mg Cpdr (Dexlansoprazole) .... By Mouth Once Daily 7)  Phillps Pearls Probiotic .Marland Kitchen.. 1 By Mouth Every Other Day 8)  Augmentin 875-125 Mg Tabs (Amoxicillin-Pot Clavulanate) .Marland Kitchen.. 1 By Mouth Two Times A Day 9)  Astepro 0.15 % Soln (Azelastine Hcl) .... 2 Sprays Each Nostril Once Daily  Current Medications (verified): 1)  Bugs Bunny Vitamins/minerals  Chew (Multiple Vitamins-Minerals) .... Take 1 Tablet By Mouth Once A Day 2)  Freestyle Test  Strp (Glucose Blood) .... Use To Test Blood Sugar Two Times A Day 3)  Freestyle Lancets  Misc (Lancets) .... Use To Test  Blood Sugar Two Times A Day 4)  Vicodin Es 7.5-750 Mg Tabs (Hydrocodone-Acetaminophen) .Marland Kitchen.. 1 By Mouth Every 6 Hours As Needed 5)  Flonase 50 Mcg/act Susp (Fluticasone Propionate) .... 2 Sprays Each Nostril Once Daily 6)  Dexilant 60 Mg Cpdr (Dexlansoprazole) .... By Mouth Once Daily 7)  Phillps Pearls Probiotic .Marland Kitchen.. 1 By Mouth Every Other Day 8)  Augmentin 875-125 Mg Tabs (Amoxicillin-Pot Clavulanate) .Marland Kitchen.. 1 By Mouth Two Times A Day 9)  Astepro 0.15 % Soln (Azelastine Hcl) .... 2 Sprays Each Nostril Once Daily 10)  Ceftin 500 Mg Tabs (Cefuroxime Axetil) .Marland Kitchen.. 1 By Mouth Two Times A Day 11)  Crestor 20 Mg Tabs (Rosuvastatin Calcium) .Marland Kitchen.. 1 1/2 Tab By Mouth At Bedtime  Allergies (verified): No Known Drug Allergies  Past History:  Past Medical History: Last updated: 12/10/2008 Diabetes mellitus, type II Hyperlipidemia Chronic pain mgt -    Right sacroiliac arthropathy.   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 Family History Breast cancer 1st degree relative <50 Family History of Colon CA 1st degree relative <60 Family History Diabetes 1st degree relative  Social History: Reviewed history from 11/25/2008 and no changes required. Single Occupation:   Disabled from [employer] - back and leg pain Current Smoker Drug use-no Oringinally from [employer] ([place])Does Patient Exercise:  no Dental Care w/in 6 mos.:  yes  Review of Systems      See HPI General:  Denies chills, fatigue, fever, loss of appetite, malaise, sleep disorder, sweats, weakness, and weight loss. Eyes:  Denies blurring, discharge, double vision, eye irritation, eye pain, halos, itching, light sensitivity, red eye, vision loss-1 eye, and vision loss-both eyes; optho q1y. ENT:  Denies decreased hearing, difficulty swallowing, ear discharge, earache, hoarseness, nasal congestion, nosebleeds, postnasal drainage, ringing in ears, sinus pressure, and sore throat. CV:  Denies bluish discoloration of lips or nails, chest pain or discomfort, difficulty breathing at night, difficulty breathing while lying down, fainting, fatigue, leg cramps with exertion, lightheadness, near fainting, palpitations, shortness of breath with exertion, swelling of feet, swelling of hands, and  weight gain. Resp:  Denies chest discomfort, chest pain with inspiration, cough, coughing up blood, excessive snoring, hypersomnolence, morning headaches, pleuritic, shortness of breath, sputum productive, and wheezing. GI:  Denies abdominal pain, bloody stools, change in bowel habits, constipation, dark tarry stools, diarrhea, excessive appetite, gas, hemorrhoids, indigestion, loss of appetite, nausea, vomiting, vomiting blood, and yellowish skin color. GU:  Denies abnormal vaginal bleeding, decreased libido, discharge, dysuria, genital sores, hematuria, incontinence, nocturia, urinary frequency, and urinary hesitancy. MS:  Denies joint pain, joint redness, joint swelling, loss of strength, low back pain, mid back pain, muscle aches, muscle , cramps, muscle weakness, stiffness, and thoracic pain. Derm:  Denies changes in color of skin, changes in nail beds, dryness, excessive perspiration, flushing, hair loss, insect  bite(s), itching, lesion(s), poor wound healing, and rash. Neuro:  Denies brief paralysis, difficulty with concentration, disturbances in coordination, falling down, headaches, inability to speak, memory loss, numbness, poor balance, seizures, sensation of room spinning, tingling, tremors, visual disturbances, and weakness. Psych:  Denies alternate hallucination ( auditory/visual), anxiety, depression, easily angered, easily tearful, irritability, mental problems, panic attacks, sense of great danger, suicidal thoughts/plans, thoughts of violence, unusual visions or sounds, and thoughts /plans of harming others. Endo:  Denies cold intolerance, excessive hunger, excessive thirst, excessive urination, heat intolerance, polyuria, and weight change. Heme:  Denies abnormal bruising, bleeding, enlarge lymph nodes, fevers, pallor, and skin discoloration. Allergy:  Denies hives or rash, itching eyes, persistent infections, seasonal allergies, and sneezing.  Physical Exam  General:  Well-developed,well-nourished,in no acute distress; alert,appropriate and cooperative throughout examination Head:  Normocephalic and atraumatic without obvious abnormalities. No apparent alopecia or balding. Eyes:  vision grossly intact, pupils equal, pupils round, pupils reactive to light, and no injection.   Ears:  External ear exam shows no significant lesions or deformities.  Otoscopic examination reveals clear canals, tympanic membranes are intact bilaterally without bulging, retraction, inflammation or discharge. Hearing is grossly normal bilaterally. Nose:  External nasal examination shows no deformity or inflammation. Nasal mucosa are pink and moist without lesions or exudates. Mouth:  Oral mucosa and oropharynx without lesions or exudates.  Teeth in good repair. Neck:  No deformities, masses, or tenderness noted. Chest Wall:  No deformities, masses, or tenderness noted. Breasts:  No mass, nodules, thickening,  tenderness, bulging, retraction, inflamation, nipple discharge or skin changes noted.   Lungs:  Normal respiratory effort, chest expands symmetrically. Lungs are clear to auscultation, no crackles or wheezes. Heart:  normal rate and no murmur.   Abdomen:  Bowel sounds positive,abdomen soft and non-tender without masses, organomegaly or hernias noted. Rectal:  No external abnormalities noted. Normal sphincter tone. No rectal masses or tenderness.  heme neg brown stool Genitalia:  Pelvic Exam:        External: normal female genitalia without lesions or masses        Vagina: normal without lesions or masses        Cervix: normal without lesions or masses        Adnexa: normal bimanual exam without masses or fullness        Uterus: normal by palpation        Pap smear: performed Msk:  normal ROM, no joint tenderness, no joint swelling, no joint warmth, no redness over joints, no joint deformities, and no joint instability.   Pulses:  R and L carotid,radial,femoral,dorsalis pedis and posterior tibial pulses are full and equal bilaterally Extremities:  No clubbing, cyanosis, edema, or deformity noted with normal full range of motion of  all joints.   Neurologic:  alert & oriented X3, strength normal in all extremities, and gait normal.    Diabetes Management Exam:    Foot Exam (with socks and/or shoes not present):       Sensory-Pinprick/Light touch:          Left medial foot (L-4): normal          Left dorsal foot (L-5): normal          Left lateral foot (S-1): normal          Right medial foot (L-4): normal          Right dorsal foot (L-5): normal          Right lateral foot (S-1): normal       Sensory-Monofilament:          Left foot: normal          Right foot: normal       Inspection:          Left foot: normal          Right foot: normal       Nails:          Left foot: normal          Right foot: normal    Eye Exam:       Eye Exam done elsewhere          Date: 11/03/2009           Results: normal          Done by: optho   Impression & Recommendations:  Problem # 1:  PREVENTIVE HEALTH CARE (ICD-V70.0)  Orders: Radiology Referral (Radiology) Venipuncture ([address]) TLB-Lipid Panel (80061-LIPID) TLB-BMP (Basic Metabolic Panel-BMET) (80048-METABOL) TLB-CBC Platelet - w/Differential (85025-CBCD) TLB-Hepatic/Liver Function Pnl (80076-HEPATIC) TLB-TSH (Thyroid Stimulating Hormone) (84443-TSH) TLB-A1C / Hgb A1C (Glycohemoglobin) (83036-A1C) TLB-Microalbumin/Creat Ratio, Urine (82043-MALB)  Problem # 2:  TOBACCO ABUSE (ICD-305.1)  Orders: Psychology Referral (Psychology) Venipuncture ([address]) TLB-Lipid Panel (80061-LIPID) TLB-BMP (Basic Metabolic Panel-BMET) (80048-METABOL) TLB-CBC Platelet - w/Differential (85025-CBCD) TLB-Hepatic/Liver Function Pnl (80076-HEPATIC) TLB-TSH (Thyroid Stimulating Hormone) (84443-TSH) TLB-A1C / Hgb A1C (Glycohemoglobin) (83036-A1C) TLB-Microalbumin/Creat Ratio, Urine (82043-MALB)  Encouraged smoking cessation and discussed different methods for smoking cessation.   Problem # 3:  HYPERLIPIDEMIA (ICD-272.4)  Her updated medication list for this problem includes:    Crestor 20 Mg Tabs (Rosuvastatin calcium) .Marland Kitchen... 1 1/2 tab by mouth at bedtime  Orders: Venipuncture ([address]) TLB-Lipid Panel (80061-LIPID) TLB-BMP (Basic Metabolic Panel-BMET) (80048-METABOL) TLB-CBC Platelet - w/Differential (85025-CBCD) TLB-Hepatic/Liver Function Pnl (80076-HEPATIC) TLB-TSH (Thyroid Stimulating Hormone) (84443-TSH) TLB-A1C / Hgb A1C (Glycohemoglobin) (83036-A1C) TLB-Microalbumin/Creat Ratio, Urine (82043-MALB)  Labs Reviewed: SGOT: 16 (06/30/2010)   SGPT: 9 (06/30/2010)   HDL:61.00 (03/10/2010), 63.40 (09/28/2009)  LDL:118 (03/10/2010), 124 (09/28/2009)  Chol:192 (03/10/2010), 200 (09/28/2009)  Trig:64.0 (03/10/2010), 61.0 (09/28/2009)  Problem # 4:  DIABETES MELLITUS, TYPE II (ICD-250.00)  Orders: Venipuncture (91478) TLB-Lipid Panel  (80061-LIPID) TLB-BMP (Basic Metabolic Panel-BMET) (80048-METABOL) TLB-CBC Platelet - w/Differential (85025-CBCD) TLB-Hepatic/Liver Function Pnl (80076-HEPATIC) TLB-TSH (Thyroid Stimulating Hormone) (84443-TSH) TLB-A1C / Hgb A1C (Glycohemoglobin) (83036-A1C) TLB-Microalbumin/Creat Ratio, Urine (82043-MALB)  Labs Reviewed: Creat: 0.8 (06/30/2010)     Last Eye Exam: normal (11/03/2009) Reviewed HgBA1c results: 5.9 (03/10/2010)  5.9 (09/28/2009)  Complete Medication List: 1)  Bugs Bunny Vitamins/minerals Chew (Multiple vitamins-minerals) .... Take 1 tablet by mouth once a day 2)  Freestyle Test Strp (Glucose blood) .... Use to test blood sugar two times a day 3)  Freestyle Lancets Misc (Lancets) .... Use to  test blood sugar two times a day 4)  Vicodin Es 7.5-750 Mg Tabs (Hydrocodone-acetaminophen) .Marland Kitchen.. 1 by mouth every 6 hours as needed 5)  Flonase 50 Mcg/act Susp (Fluticasone propionate) .... 2 sprays each nostril once daily 6)  Dexilant 60 Mg Cpdr (Dexlansoprazole) .... By mouth once daily 7)  Phillps Pearls Probiotic  .Marland Kitchen.. 1 by mouth every other day 8)  Augmentin 875-125 Mg Tabs (Amoxicillin-pot clavulanate) .Marland Kitchen.. 1 by mouth two times a day 9)  Astepro 0.15 % Soln (Azelastine hcl) .... 2 sprays each nostril once daily 10)  Ceftin 500 Mg Tabs (Cefuroxime axetil) .Marland Kitchen.. 1 by mouth two times a day 11)  Crestor 20 Mg Tabs (Rosuvastatin calcium) .Marland Kitchen.. 1 1/2 tab by mouth at bedtime  Other Orders: TwinRix 1ml ( Hep A&B Adult dose) (95284) Admin 1st Vaccine (13244)  Patient Instructions: 1)  Please schedule a follow-up appointment in 6 months .  Prescriptions: CRESTOR 20 MG TABS (ROSUVASTATIN CALCIUM) 1 1/2 tab by mouth at bedtime  #45 x 2   Entered and Authorized by:   Loreen Freud DO   Signed by:   Loreen Freud DO on 10/31/2010   Method used:   Electronically to        [facility] (retail)       [address]       [place], [place]  [address]       Ph:  [phone]       Fax: [phone]   RxID:   [id number] CEFTIN 500 MG TABS (CEFUROXIME AXETIL) 1 by mouth two times a day  #14 x 0   Entered and Authorized by:   Loreen Freud DO   Signed by:   Loreen Freud DO on 10/31/2010   Method used:   Electronically to        [facility] (retail)       [address]       [place], [place]  [address]       Ph: [phone]       Fax: [phone]   RxID:   [id number]    Orders Added: 1)  Psychology Referral [Psychology] 2)  Radiology Referral [Radiology] 3)  Est. Patient 18-39 years [99395] 4)  Venipuncture [36415] 5)  TLB-Lipid Panel [80061-LIPID] 6)  TLB-BMP (Basic Metabolic Panel-BMET) [80048-METABOL] 7)  TLB-CBC Platelet - w/Differential [85025-CBCD] 8)  TLB-Hepatic/Liver Function Pnl [80076-HEPATIC] 9)  TLB-TSH (Thyroid Stimulating Hormone) [84443-TSH] 10)  TLB-A1C / Hgb A1C (Glycohemoglobin) [83036-A1C] 11)  TLB-Microalbumin/Creat Ratio, Urine [82043-MALB] 12)  TwinRix 1ml ( Hep A&B Adult dose) [90636] 13)  Admin 1st Vaccine [90471]   Immunizations Administered:  TwinRix # 3:    Vaccine Type: TwinRix    Site: left deltoid    Mfr: Merck    Dose: 0.5 ml    Route: IM    Given by: Almeta Monas CMA (AAMA)    Exp. Date: 07/29/2012    Lot #: [id number]    VIS given: 07/11/07 version given October 31, 2010.   Immunizations Administered:  TwinRix # 3:    Vaccine Type: TwinRix    Site: left deltoid    Mfr: Merck    Dose: 0.5 ml    Route: IM    Given by: Almeta Monas CMA (AAMA)    Exp. Date: 07/29/2012    Lot #: [id number]    VIS given: 07/11/07 version given October 31, 2010.  Flu Vaccine Next Due:  Refused      Appended  Document: cpx//pap//pt will be fasting//lch  Laboratory Results   Urine Tests   Date/Time Reported: October 31, 2010 10:34 AM   Routine Urinalysis   Color: yellow Appearance: Clear Glucose: negative   (Normal Range: Negative) Bilirubin: negative    (Normal Range: Negative) Ketone: negative   (Normal Range: Negative) Spec. Gravity: 1.015   (Normal Range: 1.003-1.035) Blood: negative   (Normal Range: Negative) pH: 5.0   (Normal Range: 5.0-8.0) Protein: negative   (Normal Range: Negative) Urobilinogen: negative   (Normal Range: 0-1) Nitrite: negative   (Normal Range: Negative) Leukocyte Esterace: negative   (Normal Range: Negative)    Comments: Floydene Flock  October 31, 2010 10:34 AM

## 2010-11-24 NOTE — Letter (Signed)
Summary: Results Follow-up Letter  [facility] [place]  [address] [place], [place] [address]   Phone: [phone]  Fax: [phone]    11/03/2010     Emily Maddox [address] [place], [place]  [address]      Dear Ms. Emily Maddox,   The following are the results of your recent test(s):  Test     Result     Pap Smear    Normal___X____  Not Normal_____       Comments: _________________________________________________________ Cholesterol LDL(Bad cholesterol):          Your goal is less than:         HDL (Good cholesterol):        Your goal is more than: _________________________________________________________ Other Tests:   _________________________________________________________  Please call for an appointment Or _________________________________________________________ _________________________________________________________ _________________________________________________________  Sincerely,  Almeta Monas CMA (AAMA) [place] [facility]

## 2010-11-24 NOTE — Progress Notes (Signed)
Summary: Results lmom 1/19,1/23  Phone Note Outgoing Call   Call placed by: Almeta Monas CMA Duncan Dull),  November 08, 2010 11:25 AM Call placed to: Patient Details for Reason: Ideally your LDL (bad cholesterol) should be <_70___, your HDL (good cholesterol) should be >__50_ and your triglycerides should be< 150.  Diet and exercise will increase HDL and decrease the LDL and triglycerides. Read Dr. Danice Goltz book--Eat Drink and Be Healthy.  We will recheck labs in _3__ months. Increase Crestor to 40 mg  #30  1 by mouth at bedtime ,  2 refills DM con'trolled---con't meds 272.4  250.00  [facility], bmp----we will call pt and have her come in to go over labs at that time  Summary of Call: Left message to call back on home #.... Almeta Monas CMA Duncan Dull)  November 08, 2010 11:25 AM  spoke with patient and she stated she had not been on meds in 6 months, stated she did not want to jump to 40mg  because her body did not do well with meds, wants to know if she can start on a lower dose that 40 mg...Marland Kitchen please advise Initial call taken by: Almeta Monas CMA Duncan Dull),  November 09, 2010 3:51 PM  Follow-up for Phone Call        Yes--if she was not taking med than she can absolutely take lower dose---- did she ever take the 20 mg dose  at 1 1/2 ?   When she says her body didn't do well --what does that mean?  If she had severe SE---we made need to pick something different. Follow-up by: Loreen Freud DO,  November 09, 2010 8:54 PM  Additional Follow-up for Phone Call Additional follow up Details #1::        Patient called back and said it wasn't nessicarily a reaction, she said she felt that it was to much at one time. She said she will restart on the 20mg  and then can increase it over time to 1 1/2.   She also said you had told her to follow-up with her dentist for her dry mouth. He dental insurance does not cover that, but we can try to send a referral to [facility] that it is medically  nessacary for her to see the dentist. Please advise.  Additional Follow-up by: Harold Barban,  November 10, 2010 10:27 AM    Additional Follow-up for Phone Call Additional follow up Details #2::    I thought I told her to try Biotin otc and then to discuss it with her dentist at her next routine visit---- does she not have insurance to see a dentist. Follow-up by: Loreen Freud DO,  November 10, 2010 4:39 PM  Additional Follow-up for Phone Call Additional follow up Details #3:: Details for Additional Follow-up Action Taken: Left message to call back.... Almeta Monas CMA Duncan Dull)  November 10, 2010 4:52 PM Left message to call back.... Almeta Monas CMA Duncan Dull)  November 14, 2010 10:42 AM  pt aware... Almeta Monas CMA Duncan Dull)  November 14, 2010 4:58 PM

## 2010-11-24 NOTE — Progress Notes (Signed)
----   Converted from flag ---- ---- 11/01/2010 2:34 PM, Floydene Flock wrote: lab says no Malb on this pt.  but I know it was sent.Marland Kitchen??? ------------------------------

## 2010-12-30 ENCOUNTER — Encounter: Payer: Self-pay | Admitting: Family Medicine

## 2011-01-10 NOTE — Letter (Addendum)
Summary: Denial Of Medical Coverage  Denial Of Medical Coverage   Imported By: Kassie Mends 01/06/2011 08:36:31  _____________________________________________________________________  External Attachment:    Type:   Image     Comment:   External Document  Appended Document: Denial Of Medical Coverage we did not refer her to [facility]-- did she try to make an appointment with them?  Appended Document: Denial Of Medical Coverage She stated the insurance company denied the claim because she setup the appt but she has an appt with Dr.Rosenberg and she will try to get the referral through them.

## 2011-03-10 NOTE — Assessment & Plan Note (Signed)
The patient follows up today after I last saw her August 06, 2006.  She had  a right SI joint injection under fluoroscopic guidance on that date.  This  gave her excellent pain relief for 4 or 5 days with gradual resumption of  pain.   She had a prescription for Limbrel, however, not covered by insurance.  She  has problems with Tramadol making her too sleepy.  She has tried Ultracet  and generic Tramadol.   Her main complaint today is mid-back pain which has been going on for quite  some time as well as resumption of her right sacroiliac pain.   Her sleep is graded as poor.   PHYSICAL EXAMINATION:  VITAL SIGNS:  Blood pressure 114/76, pulse 76,  respirations 16, O2 sat 100% on room air.  She has pain over the right PSIS  area.  Her mid-back has some tenderness, more axially, along the spinous  processes within the paraspinal region.  She is able to bend forward and  bend backward, however, bend backward hurts more than bending forward in  terms of her mid-back pain and really does not have much affect on her lower  PSIS pain.  Her gait is normal.   IMPRESSION:  1. Right sacroiliac joint arthropathy.  As I spoke to the patient, I have      an 80% degree of certainty that this is the cause of her pain in that      region.  2. 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   PLAN:  1. Send her to therapy with no thoracic extension exercises and also      focusing on that side as well as thoracic facet area.  2. Looked at prescription drug benefits.  Will trial Salsalate 1500 b.i.d.  3. Consider right sacroiliac joint injection again if she does not get      good relief with physical therapy and as a possible precursor to      sacroiliac radio frequency procedure.      Erick Colace, M.D.  Electronically Signed     AEK/MedQ  D:  09/03/2006 10:19:54  T:  09/03/2006 10:54:36  Job #:  [id number]   cc:   [facility]  [facility]   Marikay Alar, M.D.

## 2011-03-10 NOTE — Procedures (Signed)
NAME:  Emily Maddox, Emily Maddox NO.:  [id number]   MEDICAL RECORD NO.:  [id number]          PATIENT TYPE:  REC   LOCATION:  [facility]                          FACILITY:  [facility]   PHYSICIAN:  Erick Colace, M.D.DATE OF BIRTH:  04-Aug-1971   DATE OF PROCEDURE:  DATE OF DISCHARGE:                                 OPERATIVE REPORT   Patient seen in initial consultation on June 05, 2006.   This is a right sacroiliac joint injection under fluoroscopic guidance.   INDICATIONS:  Low back and buttock pain with tenderness over PSIS and pain  over that area with Faber's maneuver.  The pain is only partially responsive  to medication management.  She is sensitive to medications and only  tolerates a half of tramadol at a time.   Informed consent was obtained after describing risks and benefits of the  procedure to the patient, which included bleeding, bruising, infection, loss  of bowel or bladder function, temporary or permanent paralysis.  She elected  to proceed and has given written consent.  The patient placed prone across  the table.  Betadine prep, sterile drape.  A 25 gauge 1-1/2 inch needle was  used to incise the skin and subcu tissue, 1% lidocaine x2 cc, and a 25 gauge  3 inch spinal needle was inserted under fluoroscopic guidance in the right  SI joint.  AP and lateral imaging, Omnipaque 180 demonstrated no  intravascular uptake.  Then a solution containing 1 cc of 40mg /cc Depo-  Medrol and 1 cc of 2% lidocaine were injected.  The patient tolerated the  procedure well.  Pre and post injection vitals stable.  She will return in  one month for a possible repeat injection if it was initially helpful and  has worn off.  If not particularly helpful, consider lumbar facet medial  branch blocks on the right side.      Erick Colace, M.D.  Electronically Signed     AEK/MEDQ  D:  08/06/2006 14:57:25  T:  08/07/2006 14:36:45  Job:  [id number]   cc:   Tia Alert, MD  Fax: [phone]
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
## 2011-03-10 NOTE — Assessment & Plan Note (Signed)
HISTORY:  [age]-year-old female with a fall in 1999, back and  buttock pain mainly on the right side since that time. She had a lumber  epidural steroid injection in [place] prior to relocating to [place]  about 2 years ago. She had a thoracic epidural that did not help her at  all. She had an MRI of her thoracic spine that showed some mild central  disk bulge at T6-7. MRI of  the lumbar showed no evidence of disk  herniation. She had a right sacroiliac joint injection on August 06, 2006 which gave her some temporary relief of her symptoms. She has not  had any repeat injections. She has finished up physical therapy through  [facility] outpatient [facility] from September 04, 2006 to November 02, 2006;  met her goals partially. Independent with home exercise program.   CURRENT MEDICATIONS:  Ultracet 1 p.o. b.i.d.  She never filled the  salicylate.   PAIN LEVEL: Four out of 10.   SLEEP: Is poor.   Relief from medications is fair. She has had some increased numbness in  the left lower extremity and tingling in that side. Spasms are about the  same. Weakness about the same.   PAST HISTORY:  Significant for diabetes which is well controlled.   EXAMINATION:  VITAL SIGNS: Blood pressure is 131/84, pulse 99,  respirations 16, O2 saturation is 100% on room air.  GENERAL: In no acute distress. Well-developed, well-nourished. Oriented  x3. Bright and alert. Gait is normal.  MUSCULOSKELETAL: Upper and lower extremity strength is normal. Upper and  lower extremity range of motion is normal. She has some tenderness in  the right PSIS.  She has normal deep tendon reflexes.   IMPRESSION:  1. Right sacroiliac arthropathy.  2. 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   PLAN:  1. Recommend repeat right SI joint injection. States she will think      about that.  2. Continue Ultracet 1 p.o. b.i.d.  3. Recommend home exercise program.  4. See her back in about 3 months unless she decides on the  injection,      we can see her back sooner.      Erick Colace, M.D.  Electronically Signed     AEK/MedQ  D:  11/20/2006 16:25:56  T:  11/20/2006 20:12:40  Job #:  [id number]   cc:   [facility]

## 2011-04-15 ENCOUNTER — Encounter: Payer: Self-pay | Admitting: Family Medicine

## 2011-04-21 ENCOUNTER — Ambulatory Visit (INDEPENDENT_AMBULATORY_CARE_PROVIDER_SITE_OTHER): Payer: PRIVATE HEALTH INSURANCE | Admitting: Family Medicine

## 2011-04-21 ENCOUNTER — Encounter: Payer: Self-pay | Admitting: Family Medicine

## 2011-04-21 VITALS — BP 114/78 | HR 106 | Temp 98.7°F | Wt 167.4 lb

## 2011-04-21 DIAGNOSIS — R000 Tachycardia, unspecified: Secondary | ICD-10-CM

## 2011-04-21 DIAGNOSIS — F17200 Nicotine dependence, unspecified, uncomplicated: Secondary | ICD-10-CM

## 2011-04-21 DIAGNOSIS — E785 Hyperlipidemia, unspecified: Secondary | ICD-10-CM

## 2011-04-21 DIAGNOSIS — E119 Type 2 diabetes mellitus without complications: Secondary | ICD-10-CM

## 2011-04-21 DIAGNOSIS — I10 Essential (primary) hypertension: Secondary | ICD-10-CM

## 2011-04-21 LAB — POCT URINALYSIS DIPSTICK
Bilirubin, UA: NEGATIVE
Blood, UA: NEGATIVE
Glucose, UA: NEGATIVE
Ketones, UA: NEGATIVE
Leukocytes, UA: NEGATIVE
Nitrite, UA: NEGATIVE
Spec Grav, UA: 1.015
Urobilinogen, UA: 0.2
pH, UA: 6

## 2011-04-21 LAB — HEPATIC FUNCTION PANEL
ALT: 9 U/L (ref 0–35)
AST: 15 U/L (ref 0–37)
Bilirubin, Direct: 0.1 mg/dL (ref 0.0–0.3)
Total Bilirubin: 0.6 mg/dL (ref 0.3–1.2)
Total Protein: 7 g/dL (ref 6.0–8.3)

## 2011-04-21 LAB — TSH: TSH: 1.63 u[IU]/mL (ref 0.35–5.50)

## 2011-04-21 LAB — BASIC METABOLIC PANEL
CO2: 27 mEq/L (ref 19–32)
Calcium: 9.1 mg/dL (ref 8.4–10.5)
Chloride: 105 mEq/L (ref 96–112)
Glucose, Bld: 105 mg/dL — ABNORMAL HIGH (ref 70–99)
Sodium: 138 mEq/L (ref 135–145)

## 2011-04-21 LAB — HEMOGLOBIN A1C: Hgb A1c MFr Bld: 6.2 % (ref 4.6–6.5)

## 2011-04-21 LAB — MICROALBUMIN / CREATININE URINE RATIO
Creatinine,U: 240.3 mg/dL
Microalb Creat Ratio: 0.3 mg/g (ref 0.0–30.0)
Microalb, Ur: 0.7 mg/dL (ref 0.0–1.9)

## 2011-04-21 LAB — T4, FREE: Free T4: 1.2 ng/dL (ref 0.60–1.60)

## 2011-04-21 LAB — LIPID PANEL
Cholesterol: 211 mg/dL — ABNORMAL HIGH (ref 0–200)
HDL: 61 mg/dL (ref >39.00)
Total CHOL/HDL Ratio: 3
Triglycerides: 64 mg/dL (ref 0.0–149.0)
VLDL: 12.8 mg/dL (ref 0.0–40.0)

## 2011-04-21 LAB — LDL CHOLESTEROL, DIRECT: Direct LDL: 142 mg/dL

## 2011-04-21 MED ORDER — PITAVASTATIN CALCIUM 2 MG PO TABS: ORAL_TABLET | ORAL | Status: DC

## 2011-04-21 NOTE — Assessment & Plan Note (Signed)
Diet controlled. Check labs.  

## 2011-04-21 NOTE — Progress Notes (Signed)
  Subjective:    Patient ID: Emily Maddox, female    DOB: [birth year], 40 y.o.   MRN: [id number]  HPI Pt here for f/u.  She stopped taking crestor because it made her sleepy.  No other complaints. HYPERTENSION Disease Monitoring Blood pressure range-not checking at home Chest pain- no      Dyspnea- no Medications Compliance- good Lightheadedness- no   Edema- no   DIABETES Disease Monitoring Blood Sugar ranges-80-101 Polyuria- no New Visual problems- no Medications Compliance- good Hypoglycemic symptoms- no   HYPERLIPIDEMIA Disease Monitoring See symptoms for Hypertension Medications Compliance- poor RUQ pain- no  Muscle aches- yes  ROS See HPI above   PMH Smoking Status noted     Review of Systems See above    Objective:   Physical Exam  Constitutional: She is oriented to person, place, and time. She appears well-developed and well-nourished.  Neck: Normal range of motion. Neck supple.  Cardiovascular: Regular rhythm and normal heart sounds.        tachycardia  Pulmonary/Chest: Effort normal and breath sounds normal. No respiratory distress. She has no wheezes. She has no rales.  Abdominal: Soft. Bowel sounds are normal.  Musculoskeletal: Normal range of motion. She exhibits no edema.  Neurological: She is alert and oriented to person, place, and time.  Psychiatric: She has a normal mood and affect. Her behavior is normal. Judgment and thought content normal.          Assessment & Plan:

## 2011-04-21 NOTE — Assessment & Plan Note (Signed)
Pt will try patches to stop

## 2011-04-21 NOTE — Patient Instructions (Signed)
Tachycardia - Nonspecific  In adults, the heart normally beats between 60 and 100 times a minute. A heart rate over 100 is called tachycardia. When your heart beats too fast, it may not be able to pump enough blood to the rest of the body.  SYMPTOMS   Palpitations (rapid or irregular heartbeat).    Dizziness.    Tiredness (fatigue).    Shortness of breath.   CAUSES   Exercise or exertion.   Fever.    Pain or injury.    Infection.    Loss of fluid (dehydration).    Overactive thyroid.    Lack of red blood cells (anemia).    Anxiety.   Alcohol.   Heart arrhythmia.    Caffeine.    Tobacco products.    Diet pills.    Street drugs.    Heart disease.    DIAGNOSIS  After an exam and taking a history, your caregiver may order:   Blood tests.    Electrocardiogram (EKG).    Heart monitor.   TREATMENT  Treatment will depend on the cause and potential for harm. It may include:   Intravenous (IV) replacement of fluids or blood.    Antidote or reversal medicines.    Changes in your present medicines.    Lifestyle changes.   HOME CARE INSTRUCTIONS   Get rest.    Drink enough water and fluids to keep your urine clear or pale yellow.    Avoid:    Caffeine.   Nicotine.    Alcohol.   Stress.   Chocolate.    Stimulants.     Only take medicine as directed by your caregiver.   SEEK IMMEDIATE MEDICAL CARE IF:   You have pain in your chest, upper arms, jaw, or neck.    You become weak, dizzy, or feel faint.    You have palpitations that will not go away.    You throw up (vomit), have diarrhea, or pass blood.    You look pale and your skin is cool and wet.   MAKE SURE YOU:   Understand these instructions.    Will watch your condition.    Will get help right away if you are not doing well or get worse.   Document Released: 11/16/2004 Document Re-Released: 01/03/2010  [employer] Patient Information 2011 [employer].

## 2011-04-21 NOTE — Assessment & Plan Note (Signed)
Check labs Try livalo-- 2 mg  Take with co q10

## 2011-05-03 ENCOUNTER — Ambulatory Visit (HOSPITAL_COMMUNITY): Payer: PRIVATE HEALTH INSURANCE | Attending: Family Medicine | Admitting: Radiology

## 2011-05-03 ENCOUNTER — Other Ambulatory Visit (HOSPITAL_COMMUNITY): Payer: PRIVATE HEALTH INSURANCE | Admitting: Radiology

## 2011-05-03 DIAGNOSIS — I10 Essential (primary) hypertension: Secondary | ICD-10-CM | POA: Insufficient documentation

## 2011-05-03 DIAGNOSIS — E119 Type 2 diabetes mellitus without complications: Secondary | ICD-10-CM | POA: Insufficient documentation

## 2011-05-03 DIAGNOSIS — F17200 Nicotine dependence, unspecified, uncomplicated: Secondary | ICD-10-CM | POA: Insufficient documentation

## 2011-05-03 DIAGNOSIS — R000 Tachycardia, unspecified: Secondary | ICD-10-CM | POA: Insufficient documentation

## 2011-05-29 ENCOUNTER — Telehealth: Payer: Self-pay | Admitting: Family Medicine

## 2011-05-29 NOTE — Telephone Encounter (Signed)
Discussed and I would speak with rep and see if there is a patient assistance program. Any other suggestions?

## 2011-05-30 NOTE — Telephone Encounter (Signed)
We can give samples until we hear from rep

## 2011-05-31 NOTE — Telephone Encounter (Signed)
Spoke w/ pt samples given

## 2011-06-14 ENCOUNTER — Telehealth: Payer: Self-pay | Admitting: Family Medicine

## 2011-06-14 NOTE — Telephone Encounter (Signed)
I believe we did livalo because she had problems with other statins----if livalo works we may need to try to fight a little harder

## 2011-06-14 NOTE — Telephone Encounter (Signed)
Disussed with Livalo rep Tom and he stated that the only program they have is the $18 coupon, free trail and  The patient can get it filled at [facility] and it would be cheaper. Patient has Medicare so she would be paying the higher dollar amount. He stated he would bring in some more samples to give out to the patient's in the mean time. I advised the patient of the above and she stated she would accept the samples and wait to get her labs done next month and then decide what t do as far as changing meds, because she wants to be sure the Consuelo Pandy is working. Dr.Lowne d you have any other suggestions   KP

## 2011-09-01 ENCOUNTER — Encounter: Payer: Self-pay | Admitting: Family Medicine

## 2011-09-04 ENCOUNTER — Encounter: Payer: Self-pay | Admitting: Family Medicine

## 2011-09-04 ENCOUNTER — Ambulatory Visit (INDEPENDENT_AMBULATORY_CARE_PROVIDER_SITE_OTHER): Payer: PRIVATE HEALTH INSURANCE | Admitting: Family Medicine

## 2011-09-04 DIAGNOSIS — F329 Major depressive disorder, single episode, unspecified: Secondary | ICD-10-CM

## 2011-09-04 DIAGNOSIS — E785 Hyperlipidemia, unspecified: Secondary | ICD-10-CM

## 2011-09-04 DIAGNOSIS — M545 Low back pain, unspecified: Secondary | ICD-10-CM

## 2011-09-04 DIAGNOSIS — F17200 Nicotine dependence, unspecified, uncomplicated: Secondary | ICD-10-CM

## 2011-09-04 DIAGNOSIS — R739 Hyperglycemia, unspecified: Secondary | ICD-10-CM

## 2011-09-04 DIAGNOSIS — R109 Unspecified abdominal pain: Secondary | ICD-10-CM

## 2011-09-04 DIAGNOSIS — E119 Type 2 diabetes mellitus without complications: Secondary | ICD-10-CM

## 2011-09-04 DIAGNOSIS — R7309 Other abnormal glucose: Secondary | ICD-10-CM

## 2011-09-04 DIAGNOSIS — Z720 Tobacco use: Secondary | ICD-10-CM

## 2011-09-04 DIAGNOSIS — F32A Depression, unspecified: Secondary | ICD-10-CM

## 2011-09-04 LAB — POCT URINALYSIS DIPSTICK
Bilirubin, UA: NEGATIVE
Blood, UA: NEGATIVE
Glucose, UA: NEGATIVE
Leukocytes, UA: NEGATIVE
Nitrite, UA: NEGATIVE

## 2011-09-04 MED ORDER — BUPROPION HCL ER (XL) 150 MG PO TB24: ORAL_TABLET | ORAL | Status: DC

## 2011-09-04 MED ORDER — GLUCOSE BLOOD VI STRP: ORAL_STRIP | Status: AC

## 2011-09-04 MED ORDER — OMEPRAZOLE 20 MG PO TBEC: DELAYED_RELEASE_TABLET | ORAL | Status: DC

## 2011-09-04 NOTE — Patient Instructions (Signed)
Smoking Cessation This document explains the best ways for you to quit smoking and new treatments to help. It lists new medicines that can double or triple your chances of quitting and quitting for good. It also considers ways to avoid relapses and concerns you may have about quitting, including weight gain. NICOTINE: A POWERFUL ADDICTION If you have tried to quit smoking, you know how hard it can be. It is hard because nicotine is a very addictive drug. For some people, it can be as addictive as heroin or cocaine. Usually, people make 2 or 3 tries, or more, before finally being able to quit. Each time you try to quit, you can learn about what helps and what hurts. Quitting takes hard work and a lot of effort, but you can quit smoking. QUITTING SMOKING IS ONE OF THE MOST IMPORTANT THINGS YOU WILL EVER DO.  You will live longer, feel better, and live better.   The impact on your body of quitting smoking is felt almost immediately:   Within 20 minutes, blood pressure decreases. Pulse returns to its normal level.   After 8 hours, carbon monoxide levels in the blood return to normal. Oxygen level increases.   After 24 hours, chance of heart attack starts to decrease. Breath, hair, and body stop smelling like smoke.   After 48 hours, damaged nerve endings begin to recover. Sense of taste and smell improve.   After 72 hours, the body is virtually free of nicotine. Bronchial tubes relax and breathing becomes easier.   After 2 to 12 weeks, lungs can hold more air. Exercise becomes easier and circulation improves.   Quitting will reduce your risk of having a heart attack, stroke, cancer, or lung disease:   After 1 year, the risk of coronary heart disease is cut in half.   After 5 years, the risk of stroke falls to the same as a nonsmoker.   After 10 years, the risk of lung cancer is cut in half and the risk of other cancers decreases significantly.   After 15 years, the risk of coronary heart  disease drops, usually to the level of a nonsmoker.   If you are pregnant, quitting smoking will improve your chances of having a healthy baby.   The people you live with, especially your children, will be healthier.   You will have extra money to spend on things other than cigarettes.  FIVE KEYS TO QUITTING Studies have shown that these 5 steps will help you quit smoking and quit for good. You have the best chances of quitting if you use them together: 1. Get ready.  2. Get support and encouragement.  3. Learn new skills and behaviors.  4. Get medicine to reduce your nicotine addiction and use it correctly.  5. Be prepared for relapse or difficult situations. Be determined to continue trying to quit, even if you do not succeed at first.  1. GET READY  Set a quit date.   Change your environment.   Get rid of ALL cigarettes, ashtrays, matches, and lighters in your home, car, and place of work.   Do not let people smoke in your home.   Review your past attempts to quit. Think about what worked and what did not.   Once you quit, do not smoke. NOT EVEN A PUFF!  2. GET SUPPORT AND ENCOURAGEMENT Studies have shown that you have a better chance of being successful if you have help. You can get support in many ways.  Tell   your family, friends, and coworkers that you are going to quit and need their support. Ask them not to smoke around you.   Talk to your caregivers (doctor, dentist, nurse, pharmacist, psychologist, and/or smoking counselor).   Get individual, group, or telephone counseling and support. The more counseling you have, the better your chances are of quitting. Programs are available at local hospitals and health centers. Call your local health department for information about programs in your area.   Spiritual beliefs and practices may help some smokers quit.   Quit meters are small computer programs online or downloadable that keep track of quit statistics, such as amount  of "quit-time," cigarettes not smoked, and money saved.   Many smokers find one or more of the many self-help books available useful in helping them quit and stay off tobacco.  3. LEARN NEW SKILLS AND BEHAVIORS  Try to distract yourself from urges to smoke. Talk to someone, go for a walk, or occupy your time with a task.   When you first try to quit, change your routine. Take a different route to work. Drink tea instead of coffee. Eat breakfast in a different place.   Do something to reduce your stress. Take a hot bath, exercise, or read a book.   Plan something enjoyable to do every day. Reward yourself for not smoking.   Explore interactive web-based programs that specialize in helping you quit.  4. GET MEDICINE AND USE IT CORRECTLY Medicines can help you stop smoking and decrease the urge to smoke. Combining medicine with the above behavioral methods and support can quadruple your chances of successfully quitting smoking. The [place] Food and Drug Administration (FDA) has approved 7 medicines to help you quit smoking. These medicines fall into 3 categories.  Nicotine replacement therapy (delivers nicotine to your body without the negative effects and risks of smoking):   Nicotine gum: Available over-the-counter.   Nicotine lozenges: Available over-the-counter.   Nicotine inhaler: Available by prescription.   Nicotine nasal spray: Available by prescription.   Nicotine skin patches (transdermal): Available by prescription and over-the-counter.   Antidepressant medicine (helps people abstain from smoking, but how this works is unknown):   Bupropion sustained-release (SR) tablets: Available by prescription.   Nicotinic receptor partial agonist (simulates the effect of nicotine in your brain):   Varenicline tartrate tablets: Available by prescription.   Ask your caregiver for advice about which medicines to use and how to use them. Carefully read the information on the package.    Everyone who is trying to quit may benefit from using a medicine. If you are pregnant or trying to become pregnant, nursing an infant, you are under age 18, or you smoke fewer than 10 cigarettes per day, talk to your caregiver before taking any nicotine replacement medicines.   You should stop using a nicotine replacement product and call your caregiver if you experience nausea, dizziness, weakness, vomiting, fast or irregular heartbeat, mouth problems with the lozenge or gum, or redness or swelling of the skin around the patch that does not go away.   Do not use any other product containing nicotine while using a nicotine replacement product.   Talk to your caregiver before using these products if you have diabetes, heart disease, asthma, stomach ulcers, you had a recent heart attack, you have high blood pressure that is not controlled with medicine, a history of irregular heartbeat, or you have been prescribed medicine to help you quit smoking.  5. BE PREPARED FOR RELAPSE OR   DIFFICULT SITUATIONS  Most relapses occur within the first 3 months after quitting. Do not be discouraged if you start smoking again. Remember, most people try several times before they finally quit.   You may have symptoms of withdrawal because your body is used to nicotine. You may crave cigarettes, be irritable, feel very hungry, cough often, get headaches, or have difficulty concentrating.   The withdrawal symptoms are only temporary. They are strongest when you first quit, but they will go away within 10 to 14 days.  Here are some difficult situations to watch for:  Alcohol. Avoid drinking alcohol. Drinking lowers your chances of successfully quitting.   Caffeine. Try to reduce the amount of caffeine you consume. It also lowers your chances of successfully quitting.   Other smokers. Being around smoking can make you want to smoke. Avoid smokers.   Weight gain. Many smokers will gain weight when they quit, usually  less than 10 pounds. Eat a healthy diet and stay active. Do not let weight gain distract you from your main goal, quitting smoking. Some medicines that help you quit smoking may also help delay weight gain. You can always lose the weight gained after you quit.   Bad mood or depression. There are a lot of ways to improve your mood other than smoking.  If you are having problems with any of these situations, talk to your caregiver. SPECIAL SITUATIONS AND CONDITIONS Studies suggest that everyone can quit smoking. Your situation or condition can give you a special reason to quit.  Pregnant women/new mothers: By quitting, you protect your baby's health and your own.   Hospitalized patients: By quitting, you reduce health problems and help healing.   Heart attack patients: By quitting, you reduce your risk of a second heart attack.   Lung, head, and neck cancer patients: By quitting, you reduce your chance of a second cancer.   Parents of children and adolescents: By quitting, you protect your children from illnesses caused by secondhand smoke.  QUESTIONS TO THINK ABOUT Think about the following questions before you try to stop smoking. You may want to talk about your answers with your caregiver.  Why do you want to quit?   If you tried to quit in the past, what helped and what did not?   What will be the most difficult situations for you after you quit? How will you plan to handle them?   Who can help you through the tough times? Your family? Friends? Caregiver?   What pleasures do you get from smoking? What ways can you still get pleasure if you quit?  Here are some questions to ask your caregiver:  How can you help me to be successful at quitting?   What medicine do you think would be best for me and how should I take it?   What should I do if I need more help?   What is smoking withdrawal like? How can I get information on withdrawal?  Quitting takes hard work and a lot of effort,  but you can quit smoking. FOR MORE INFORMATION  Smokefree.gov (http://www.davis-sullivan.com/) provides free, accurate, evidence-based information and professional assistance to help support the immediate and long-term needs of people trying to quit smoking. Document Released: 10/03/2001 Document Revised: 06/21/2011 Document Reviewed: 07/26/2009 [facility] Patient Information 2012 [place], [place]. Abdominal Pain Abdominal pain can be caused by many things. Your caregiver decides the seriousness of your pain by an examination and possibly blood tests and X-rays. Many cases can be  observed and treated at home. Most abdominal pain is not caused by a disease and will probably improve without treatment. However, in many cases, more time must pass before a clear cause of the pain can be found. Before that point, it may not be known if you need more testing, or if hospitalization or surgery is needed. HOME CARE INSTRUCTIONS   Do not take laxatives unless directed by your caregiver.   Take pain medicine only as directed by your caregiver.   Only take over-the-counter or prescription medicines for pain, discomfort, or fever as directed by your caregiver.   Try a clear liquid diet (broth, tea, or water) for as long as directed by your caregiver. Slowly move to a bland diet as tolerated.  SEEK IMMEDIATE MEDICAL CARE IF:   The pain does not go away.   You have a fever.   You keep throwing up (vomiting).   The pain is felt only in portions of the abdomen. Pain in the right side could possibly be appendicitis. In an adult, pain in the left lower portion of the abdomen could be colitis or diverticulitis.   You pass bloody or black tarry stools.  MAKE SURE YOU:   Understand these instructions.   Will watch your condition.   Will get help right away if you are not doing well or get worse.  Document Released: 07/19/2005 Document Revised: 06/21/2011 Document Reviewed: 05/27/2008 [facility] Patient Information  2012 [place], [place].

## 2011-09-04 NOTE — Progress Notes (Signed)
  Subjective:     Emily Maddox is a 40 y.o. female who presents for evaluation of abdominal pain. Onset was 1 month ago. Symptoms have been unchanged. The pain is described as cramping, and is 4/10 in intensity. Pain is located in the LLQ with radiation to back.  Aggravating factors: none.  Alleviating factors: none. Associated symptoms: flatus. The patient denies anorexia, arthralagias, belching, chills, constipation, diarrhea, dysuria, flatus, frequency, headache, hematochezia, hematuria, melena, myalgias, nausea, sweats and vomiting.  The patient's history has been marked as reviewed and updated as appropriate.  Review of Systems Pertinent items are noted in HPI.     Objective:    BP 132/82  Pulse 135  Temp(Src) 98.7 F (37.1 C) (Oral)  Wt 169 lb 9.6 oz (76.93 kg)  SpO2 99% General appearance: alert, cooperative, appears stated age and no distress Abdomen: soft, non-tender; bowel sounds normal; no masses,  no organomegaly psych-- no depression/ anxiety    Assessment:    Abdominal pain, likely secondary to dyspepsia and gas .    Plan:  Omeprazole daily Gas X  Restart probiotic F/u GI

## 2011-09-06 ENCOUNTER — Other Ambulatory Visit: Payer: PRIVATE HEALTH INSURANCE

## 2011-09-08 ENCOUNTER — Other Ambulatory Visit (INDEPENDENT_AMBULATORY_CARE_PROVIDER_SITE_OTHER): Payer: PRIVATE HEALTH INSURANCE

## 2011-09-08 DIAGNOSIS — R109 Unspecified abdominal pain: Secondary | ICD-10-CM

## 2011-09-08 DIAGNOSIS — R739 Hyperglycemia, unspecified: Secondary | ICD-10-CM

## 2011-09-08 DIAGNOSIS — R7309 Other abnormal glucose: Secondary | ICD-10-CM

## 2011-09-08 LAB — CBC WITH DIFFERENTIAL/PLATELET
Basophils Absolute: 0 10*3/uL (ref 0.0–0.1)
Basophils Relative: 0.5 % (ref 0.0–3.0)
Eosinophils Relative: 2.3 % (ref 0.0–5.0)
HCT: 41.1 % (ref 36.0–46.0)
Hemoglobin: 13.6 g/dL (ref 12.0–15.0)
Lymphocytes Relative: 25.6 % (ref 12.0–46.0)
Lymphs Abs: 2 10*3/uL (ref 0.7–4.0)
Monocytes Relative: 5 % (ref 3.0–12.0)
Neutro Abs: 5.2 10*3/uL (ref 1.4–7.7)
RBC: 4.13 Mil/uL (ref 3.87–5.11)
RDW: 13.5 % (ref 11.5–14.6)
WBC: 7.8 10*3/uL (ref 4.5–10.5)

## 2011-09-08 LAB — BASIC METABOLIC PANEL
Calcium: 8.9 mg/dL (ref 8.4–10.5)
GFR: 87.38 mL/min (ref >60.00)
Potassium: 4.5 mEq/L (ref 3.5–5.1)
Sodium: 142 mEq/L (ref 135–145)

## 2011-09-08 LAB — HEPATIC FUNCTION PANEL
ALT: 9 U/L (ref 0–35)
AST: 15 U/L (ref 0–37)
Albumin: 4 g/dL (ref 3.5–5.2)
Total Bilirubin: 0.4 mg/dL (ref 0.3–1.2)
Total Protein: 7.6 g/dL (ref 6.0–8.3)

## 2011-09-08 NOTE — Progress Notes (Signed)
12  

## 2011-09-12 ENCOUNTER — Other Ambulatory Visit: Payer: Self-pay | Admitting: Family Medicine

## 2011-09-12 ENCOUNTER — Other Ambulatory Visit: Payer: Self-pay | Admitting: *Deleted

## 2011-09-12 DIAGNOSIS — R109 Unspecified abdominal pain: Secondary | ICD-10-CM

## 2011-09-13 ENCOUNTER — Other Ambulatory Visit (INDEPENDENT_AMBULATORY_CARE_PROVIDER_SITE_OTHER): Payer: PRIVATE HEALTH INSURANCE

## 2011-09-13 DIAGNOSIS — R109 Unspecified abdominal pain: Secondary | ICD-10-CM

## 2011-09-13 LAB — AMYLASE: Amylase: 139 U/L — ABNORMAL HIGH (ref 27–131)

## 2011-09-13 NOTE — Progress Notes (Signed)
12  

## 2011-11-20 ENCOUNTER — Other Ambulatory Visit: Payer: Self-pay | Admitting: Family Medicine

## 2012-03-25 ENCOUNTER — Other Ambulatory Visit: Payer: Self-pay | Admitting: Family Medicine

## 2012-04-01 ENCOUNTER — Ambulatory Visit (INDEPENDENT_AMBULATORY_CARE_PROVIDER_SITE_OTHER): Payer: Medicare PPO | Admitting: Family Medicine

## 2012-04-01 ENCOUNTER — Other Ambulatory Visit (HOSPITAL_COMMUNITY)
Admission: RE | Admit: 2012-04-01 | Discharge: 2012-04-01 | Disposition: A | Discharge status: Home / Self Care | Payer: Medicare PPO | Source: Ambulatory Visit | Attending: Family Medicine | Admitting: Family Medicine

## 2012-04-01 ENCOUNTER — Encounter: Payer: Self-pay | Admitting: Family Medicine

## 2012-04-01 VITALS — BP 120/80 | HR 133 | Temp 98.6°F | Ht 66.5 in | Wt 175.6 lb

## 2012-04-01 DIAGNOSIS — R000 Tachycardia, unspecified: Secondary | ICD-10-CM

## 2012-04-01 DIAGNOSIS — E119 Type 2 diabetes mellitus without complications: Secondary | ICD-10-CM

## 2012-04-01 DIAGNOSIS — Z124 Encounter for screening for malignant neoplasm of cervix: Secondary | ICD-10-CM

## 2012-04-01 DIAGNOSIS — K219 Gastro-esophageal reflux disease without esophagitis: Secondary | ICD-10-CM

## 2012-04-01 DIAGNOSIS — F17200 Nicotine dependence, unspecified, uncomplicated: Secondary | ICD-10-CM

## 2012-04-01 DIAGNOSIS — Z01419 Encounter for gynecological examination (general) (routine) without abnormal findings: Secondary | ICD-10-CM | POA: Insufficient documentation

## 2012-04-01 DIAGNOSIS — Z0000 Encounter for general adult medical examination without abnormal findings: Secondary | ICD-10-CM

## 2012-04-01 DIAGNOSIS — E785 Hyperlipidemia, unspecified: Secondary | ICD-10-CM

## 2012-04-01 LAB — POCT URINALYSIS DIPSTICK
Ketones, UA: NEGATIVE
Leukocytes, UA: NEGATIVE
Nitrite, UA: NEGATIVE
Protein, UA: NEGATIVE
Urobilinogen, UA: 0.2
pH, UA: 5

## 2012-04-01 LAB — BASIC METABOLIC PANEL
CO2: 25 mEq/L (ref 19–32)
Chloride: 106 mEq/L (ref 96–112)
Glucose, Bld: 113 mg/dL — ABNORMAL HIGH (ref 70–99)
Potassium: 3.7 mEq/L (ref 3.5–5.1)
Sodium: 139 mEq/L (ref 135–145)

## 2012-04-01 LAB — CBC WITH DIFFERENTIAL/PLATELET
Basophils Absolute: 0 10*3/uL (ref 0.0–0.1)
Eosinophils Absolute: 0.1 10*3/uL (ref 0.0–0.7)
HCT: 42.2 % (ref 36.0–46.0)
Hemoglobin: 13.6 g/dL (ref 12.0–15.0)
Lymphs Abs: 2.1 10*3/uL (ref 0.7–4.0)
MCHC: 32.3 g/dL (ref 30.0–36.0)
MCV: 100 fl (ref 78.0–100.0)
Monocytes Absolute: 0.5 10*3/uL (ref 0.1–1.0)
Monocytes Relative: 5.4 % (ref 3.0–12.0)
Neutro Abs: 5.6 10*3/uL (ref 1.4–7.7)
RDW: 13.6 % (ref 11.5–14.6)

## 2012-04-01 LAB — MICROALBUMIN / CREATININE URINE RATIO
Microalb Creat Ratio: 0.2 mg/g (ref 0.0–30.0)
Microalb, Ur: 0.4 mg/dL (ref 0.0–1.9)

## 2012-04-01 LAB — HEPATIC FUNCTION PANEL
ALT: 8 U/L (ref 0–35)
Albumin: 3.9 g/dL (ref 3.5–5.2)
Alkaline Phosphatase: 50 U/L (ref 39–117)
Total Protein: 7 g/dL (ref 6.0–8.3)

## 2012-04-01 LAB — TSH: TSH: 1.63 u[IU]/mL (ref 0.35–5.50)

## 2012-04-01 LAB — LIPID PANEL: HDL: 63.2 mg/dL (ref >39.00)

## 2012-04-01 MED ORDER — METOPROLOL SUCCINATE ER 25 MG PO TB24: 25.0000 mg | ORAL_TABLET | Freq: Every day | ORAL | Status: DC

## 2012-04-01 MED ORDER — BD LANCET ULTRAFINE 33G MISC: Status: DC

## 2012-04-01 MED ORDER — RANITIDINE HCL 150 MG PO TABS
150.0000 mg | ORAL_TABLET | Freq: Two times a day (BID) | ORAL | Status: DC
Start: 2012-04-01 — End: 2012-09-25

## 2012-04-01 NOTE — Progress Notes (Signed)
Subjective:     Emily Maddox is a 41 y.o. female and is here for a comprehensive physical exam. The patient reports problems - heart still racing--no chest pain.  She is also c/o still having GB attacks on occasion and gerd acting up.  she can not take dexilant or aciphex..  History   Social History  . Marital Status: Single    Spouse Name: N/A    Number of Children: N/A  . Years of Education: N/A   Occupational History  . [employer]--mailroom  p/t    Social History Main Topics  . Smoking status: Current Some Day Smoker -- 1.0 packs/day for 19 years    Types: Cigarettes  . Smokeless tobacco: Never Used   Comment: couldn't tolerate chantix  . Alcohol Use: No  . Drug Use: No  . Sexually Active: Yes -- Female partner(s)   Other Topics Concern  . Not on file   Social History Narrative   Exercise--  3-4 x a week   Health Maintenance  Topic Date Due  . Influenza Vaccine  07/23/2012  . Pap Smear  04/02/2015  . Tetanus/tdap  02/04/2019    The following portions of the patient's history were reviewed and updated as appropriate: allergies, current medications, past family history, past medical history, past social history, past surgical history and problem list.  Review of Systems Review of Systems  Constitutional: Negative for activity change, appetite change and fatigue.  HENT: Negative for hearing loss, congestion, tinnitus and ear discharge.  dentist q54m Eyes: Negative for visual disturbance (see optho q1y -- vision corrected to 20/20 with glasses).  Respiratory: Negative for cough, chest tightness and shortness of breath.   Cardiovascular: Negative for chest pain, palpitations and leg swelling.  Gastrointestinal: Negative for abdominal pain, diarrhea, constipation and abdominal distention.  Genitourinary: Negative for urgency, frequency, decreased urine volume and difficulty urinating.  Musculoskeletal: Negative for back pain, arthralgias and gait problem.  Skin: Negative for  color change, pallor and rash.  Neurological: Negative for dizziness, light-headedness, numbness and headaches.  Hematological: Negative for adenopathy. Does not bruise/bleed easily.  Psychiatric/Behavioral: Negative for suicidal ideas, confusion, sleep disturbance, self-injury, dysphoric mood, decreased concentration and agitation.       Objective:    BP 120/80  Pulse 133  Temp(Src) 98.6 F (37 C) (Oral)  Ht 5' 6.5" (1.689 m)  Wt 175 lb 9.6 oz (79.652 kg)  BMI 27.92 kg/m2  SpO2 98%  LMP 03/24/2012 General appearance: alert, cooperative, appears stated age and no distress Head: Normocephalic, without obvious abnormality, atraumatic Eyes: conjunctivae/corneas clear. PERRL, EOM's intact. Fundi benign. Ears: normal TM's and external ear canals both ears Nose: Nares normal. Septum midline. Mucosa normal. No drainage or sinus tenderness. Throat: lips, mucosa, and tongue normal; teeth and gums normal Neck: no adenopathy, no carotid bruit, no JVD, supple, symmetrical, trachea midline and thyroid not enlarged, symmetric, no tenderness/mass/nodules Back: symmetric, no curvature. ROM normal. No CVA tenderness. Lungs: clear to auscultation bilaterally Breasts: normal appearance, no masses or tenderness Heart: S1, S2 normal and tachy Abdomen: soft, non-tender; bowel sounds normal; no masses,  no organomegaly-- heme neg brown stool Pelvic: cervix normal in appearance, external genitalia normal, no adnexal masses or tenderness, no cervical motion tenderness, rectovaginal septum normal, uterus normal size, shape, and consistency and vagina normal without discharge Extremities: extremities normal, atraumatic, no cyanosis or edema Pulses: 2+ and symmetric Skin: Skin color, texture, turgor normal. No rashes or lesions Lymph nodes: Cervical, supraclavicular, and axillary nodes normal. Neurologic:  Alert and oriented X 3, normal strength and tone. Normal symmetric reflexes. Normal coordination and  gait psych-- no depression / anxiety    Assessment:    Healthy female exam.      Plan:  ghm utd Check fasting labs   See After Visit Summary for Counseling Recommendations

## 2012-04-01 NOTE — Patient Instructions (Signed)
Preventive Care for Adults, Female A healthy lifestyle and preventive care can promote health and wellness. Preventive health guidelines for women include the following key practices.  A routine yearly physical is a good way to check with your caregiver about your health and preventive screening. It is a chance to share any concerns and updates on your health, and to receive a thorough exam.   Visit your dentist for a routine exam and preventive care every 6 months. Brush your teeth twice a day and floss once a day. Good oral hygiene prevents tooth decay and gum disease.   The frequency of eye exams is based on your age, health, family medical history, use of contact lenses, and other factors. Follow your caregiver's recommendations for frequency of eye exams.   Eat a healthy diet. Foods like vegetables, fruits, whole grains, low-fat dairy products, and lean protein foods contain the nutrients you need without too many calories. Decrease your intake of foods high in solid fats, added sugars, and salt. Eat the right amount of calories for you.Get information about a proper diet from your caregiver, if necessary.   Regular physical exercise is one of the most important things you can do for your health. Most adults should get at least 150 minutes of moderate-intensity exercise (any activity that increases your heart rate and causes you to sweat) each week. In addition, most adults need muscle-strengthening exercises on 2 or more days a week.   Maintain a healthy weight. The body mass index (BMI) is a screening tool to identify possible weight problems. It provides an estimate of body fat based on height and weight. Your caregiver can help determine your BMI, and can help you achieve or maintain a healthy weight.For adults 20 years and older:   A BMI below 18.5 is considered underweight.   A BMI of 18.5 to 24.9 is normal.   A BMI of 25 to 29.9 is considered overweight.   A BMI of 30 and above is  considered obese.   Maintain normal blood lipids and cholesterol levels by exercising and minimizing your intake of saturated fat. Eat a balanced diet with plenty of fruit and vegetables. Blood tests for lipids and cholesterol should begin at age 20 and be repeated every 5 years. If your lipid or cholesterol levels are high, you are over 50, or you are at high risk for heart disease, you may need your cholesterol levels checked more frequently.Ongoing high lipid and cholesterol levels should be treated with medicines if diet and exercise are not effective.   If you smoke, find out from your caregiver how to quit. If you do not use tobacco, do not start.   If you are pregnant, do not drink alcohol. If you are breastfeeding, be very cautious about drinking alcohol. If you are not pregnant and choose to drink alcohol, do not exceed 1 drink per day. One drink is considered to be 12 ounces (355 mL) of beer, 5 ounces (148 mL) of wine, or 1.5 ounces (44 mL) of liquor.   Avoid use of street drugs. Do not share needles with anyone. Ask for help if you need support or instructions about stopping the use of drugs.   High blood pressure causes heart disease and increases the risk of stroke. Your blood pressure should be checked at least every 1 to 2 years. Ongoing high blood pressure should be treated with medicines if weight loss and exercise are not effective.   If you are 55 to 40   years old, ask your caregiver if you should take aspirin to prevent strokes.   Diabetes screening involves taking a blood sample to check your fasting blood sugar level. This should be done once every 3 years, after age 45, if you are within normal weight and without risk factors for diabetes. Testing should be considered at a younger age or be carried out more frequently if you are overweight and have at least 1 risk factor for diabetes.   Breast cancer screening is essential preventive care for women. You should practice "breast  self-awareness." This means understanding the normal appearance and feel of your breasts and may include breast self-examination. Any changes detected, no matter how small, should be reported to a caregiver. Women in their 20s and 30s should have a clinical breast exam (CBE) by a caregiver as part of a regular health exam every 1 to 3 years. After age 40, women should have a CBE every year. Starting at age 40, women should consider having a mammography (breast X-ray test) every year. Women who have a family history of breast cancer should talk to their caregiver about genetic screening. Women at a high risk of breast cancer should talk to their caregivers about having magnetic resonance imaging (MRI) and a mammography every year.   The Pap test is a screening test for cervical cancer. A Pap test can show cell changes on the cervix that might become cervical cancer if left untreated. A Pap test is a procedure in which cells are obtained and examined from the lower end of the uterus (cervix).   Women should have a Pap test starting at age 21.   Between ages 21 and 29, Pap tests should be repeated every 2 years.   Beginning at age 30, you should have a Pap test every 3 years as long as the past 3 Pap tests have been normal.   Some women have medical problems that increase the chance of getting cervical cancer. Talk to your caregiver about these problems. It is especially important to talk to your caregiver if a new problem develops soon after your last Pap test. In these cases, your caregiver may recommend more frequent screening and Pap tests.   The above recommendations are the same for women who have or have not gotten the vaccine for human papillomavirus (HPV).   If you had a hysterectomy for a problem that was not cancer or a condition that could lead to cancer, then you no longer need Pap tests. Even if you no longer need a Pap test, a regular exam is a good idea to make sure no other problems are  starting.   If you are between ages 65 and 70, and you have had normal Pap tests going back 10 years, you no longer need Pap tests. Even if you no longer need a Pap test, a regular exam is a good idea to make sure no other problems are starting.   If you have had past treatment for cervical cancer or a condition that could lead to cancer, you need Pap tests and screening for cancer for at least 20 years after your treatment.   If Pap tests have been discontinued, risk factors (such as a new sexual partner) need to be reassessed to determine if screening should be resumed.   The HPV test is an additional test that may be used for cervical cancer screening. The HPV test looks for the virus that can cause the cell changes on the cervix.   The cells collected during the Pap test can be tested for HPV. The HPV test could be used to screen women aged 30 years and older, and should be used in women of any age who have unclear Pap test results. After the age of 30, women should have HPV testing at the same frequency as a Pap test.   Colorectal cancer can be detected and often prevented. Most routine colorectal cancer screening begins at the age of 50 and continues through age 75. However, your caregiver may recommend screening at an earlier age if you have risk factors for colon cancer. On a yearly basis, your caregiver may provide home test kits to check for hidden blood in the stool. Use of a small camera at the end of a tube, to directly examine the colon (sigmoidoscopy or colonoscopy), can detect the earliest forms of colorectal cancer. Talk to your caregiver about this at age 50, when routine screening begins. Direct examination of the colon should be repeated every 5 to 10 years through age 75, unless early forms of pre-cancerous polyps or small growths are found.   Hepatitis C blood testing is recommended for all people born from 1945 through 1965 and any individual with known risks for hepatitis C.    Practice safe sex. Use condoms and avoid high-risk sexual practices to reduce the spread of sexually transmitted infections (STIs). STIs include gonorrhea, chlamydia, syphilis, trichomonas, herpes, HPV, and human immunodeficiency virus (HIV). Herpes, HIV, and HPV are viral illnesses that have no cure. They can result in disability, cancer, and death. Sexually active women aged 25 and younger should be checked for chlamydia. Older women with new or multiple partners should also be tested for chlamydia. Testing for other STIs is recommended if you are sexually active and at increased risk.   Osteoporosis is a disease in which the bones lose minerals and strength with aging. This can result in serious bone fractures. The risk of osteoporosis can be identified using a bone density scan. Women ages 65 and over and women at risk for fractures or osteoporosis should discuss screening with their caregivers. Ask your caregiver whether you should take a calcium supplement or vitamin D to reduce the rate of osteoporosis.   Menopause can be associated with physical symptoms and risks. Hormone replacement therapy is available to decrease symptoms and risks. You should talk to your caregiver about whether hormone replacement therapy is right for you.   Use sunscreen with sun protection factor (SPF) of 30 or more. Apply sunscreen liberally and repeatedly throughout the day. You should seek shade when your shadow is shorter than you. Protect yourself by wearing long sleeves, pants, a wide-brimmed hat, and sunglasses year round, whenever you are outdoors.   Once a month, do a whole body skin exam, using a mirror to look at the skin on your back. Notify your caregiver of new moles, moles that have irregular borders, moles that are larger than a pencil eraser, or moles that have changed in shape or color.   Stay current with required immunizations.   Influenza. You need a dose every fall (or winter). The composition of  the flu vaccine changes each year, so being vaccinated once is not enough.   Pneumococcal polysaccharide. You need 1 to 2 doses if you smoke cigarettes or if you have certain chronic medical conditions. You need 1 dose at age 65 (or older) if you have never been vaccinated.   Tetanus, diphtheria, pertussis (Tdap, Td). Get 1 dose of   Tdap vaccine if you are younger than age 65, are over 65 and have contact with an infant, are a healthcare worker, are pregnant, or simply want to be protected from whooping cough. After that, you need a Td booster dose every 10 years. Consult your caregiver if you have not had at least 3 tetanus and diphtheria-containing shots sometime in your life or have a deep or dirty wound.   HPV. You need this vaccine if you are a woman age 26 or younger. The vaccine is given in 3 doses over 6 months.   Measles, mumps, rubella (MMR). You need at least 1 dose of MMR if you were born in 1957 or later. You may also need a second dose.   Meningococcal. If you are age 19 to 21 and a first-year college student living in a residence hall, or have one of several medical conditions, you need to get vaccinated against meningococcal disease. You may also need additional booster doses.   Zoster (shingles). If you are age 60 or older, you should get this vaccine.   Varicella (chickenpox). If you have never had chickenpox or you were vaccinated but received only 1 dose, talk to your caregiver to find out if you need this vaccine.   Hepatitis A. You need this vaccine if you have a specific risk factor for hepatitis A virus infection or you simply wish to be protected from this disease. The vaccine is usually given as 2 doses, 6 to 18 months apart.   Hepatitis B. You need this vaccine if you have a specific risk factor for hepatitis B virus infection or you simply wish to be protected from this disease. The vaccine is given in 3 doses, usually over 6 months.  Preventive Services /  Frequency Ages 19 to 39  Blood pressure check.** / Every 1 to 2 years.   Lipid and cholesterol check.** / Every 5 years beginning at age 20.   Clinical breast exam.** / Every 3 years for women in their 20s and 30s.   Pap test.** / Every 2 years from ages 21 through 29. Every 3 years starting at age 30 through age 65 or 70 with a history of 3 consecutive normal Pap tests.   HPV screening.** / Every 3 years from ages 30 through ages 65 to 70 with a history of 3 consecutive normal Pap tests.   Hepatitis C blood test.** / For any individual with known risks for hepatitis C.   Skin self-exam. / Monthly.   Influenza immunization.** / Every year.   Pneumococcal polysaccharide immunization.** / 1 to 2 doses if you smoke cigarettes or if you have certain chronic medical conditions.   Tetanus, diphtheria, pertussis (Tdap, Td) immunization. / A one-time dose of Tdap vaccine. After that, you need a Td booster dose every 10 years.   HPV immunization. / 3 doses over 6 months, if you are 26 and younger.   Measles, mumps, rubella (MMR) immunization. / You need at least 1 dose of MMR if you were born in 1957 or later. You may also need a second dose.   Meningococcal immunization. / 1 dose if you are age 19 to 21 and a first-year college student living in a residence hall, or have one of several medical conditions, you need to get vaccinated against meningococcal disease. You may also need additional booster doses.   Varicella immunization.** / Consult your caregiver.   Hepatitis A immunization.** / Consult your caregiver. 2 doses, 6 to 18 months   apart.   Hepatitis B immunization.** / Consult your caregiver. 3 doses usually over 6 months.  Ages 40 to 64  Blood pressure check.** / Every 1 to 2 years.   Lipid and cholesterol check.** / Every 5 years beginning at age 20.   Clinical breast exam.** / Every year after age 40.   Mammogram.** / Every year beginning at age 40 and continuing for as  long as you are in good health. Consult with your caregiver.   Pap test.** / Every 3 years starting at age 30 through age 65 or 70 with a history of 3 consecutive normal Pap tests.   HPV screening.** / Every 3 years from ages 30 through ages 65 to 70 with a history of 3 consecutive normal Pap tests.   Fecal occult blood test (FOBT) of stool. / Every year beginning at age 50 and continuing until age 75. You may not need to do this test if you get a colonoscopy every 10 years.   Flexible sigmoidoscopy or colonoscopy.** / Every 5 years for a flexible sigmoidoscopy or every 10 years for a colonoscopy beginning at age 50 and continuing until age 75.   Hepatitis C blood test.** / For all people born from 1945 through 1965 and any individual with known risks for hepatitis C.   Skin self-exam. / Monthly.   Influenza immunization.** / Every year.   Pneumococcal polysaccharide immunization.** / 1 to 2 doses if you smoke cigarettes or if you have certain chronic medical conditions.   Tetanus, diphtheria, pertussis (Tdap, Td) immunization.** / A one-time dose of Tdap vaccine. After that, you need a Td booster dose every 10 years.   Measles, mumps, rubella (MMR) immunization. / You need at least 1 dose of MMR if you were born in 1957 or later. You may also need a second dose.   Varicella immunization.** / Consult your caregiver.   Meningococcal immunization.** / Consult your caregiver.   Hepatitis A immunization.** / Consult your caregiver. 2 doses, 6 to 18 months apart.   Hepatitis B immunization.** / Consult your caregiver. 3 doses, usually over 6 months.  Ages 65 and over  Blood pressure check.** / Every 1 to 2 years.   Lipid and cholesterol check.** / Every 5 years beginning at age 20.   Clinical breast exam.** / Every year after age 40.   Mammogram.** / Every year beginning at age 40 and continuing for as long as you are in good health. Consult with your caregiver.   Pap test.** /  Every 3 years starting at age 30 through age 65 or 70 with a 3 consecutive normal Pap tests. Testing can be stopped between 65 and 70 with 3 consecutive normal Pap tests and no abnormal Pap or HPV tests in the past 10 years.   HPV screening.** / Every 3 years from ages 30 through ages 65 or 70 with a history of 3 consecutive normal Pap tests. Testing can be stopped between 65 and 70 with 3 consecutive normal Pap tests and no abnormal Pap or HPV tests in the past 10 years.   Fecal occult blood test (FOBT) of stool. / Every year beginning at age 50 and continuing until age 75. You may not need to do this test if you get a colonoscopy every 10 years.   Flexible sigmoidoscopy or colonoscopy.** / Every 5 years for a flexible sigmoidoscopy or every 10 years for a colonoscopy beginning at age 50 and continuing until age 75.   Hepatitis   C blood test.** / For all people born from 1945 through 1965 and any individual with known risks for hepatitis C.   Osteoporosis screening.** / A one-time screening for women ages 65 and over and women at risk for fractures or osteoporosis.   Skin self-exam. / Monthly.   Influenza immunization.** / Every year.   Pneumococcal polysaccharide immunization.** / 1 dose at age 65 (or older) if you have never been vaccinated.   Tetanus, diphtheria, pertussis (Tdap, Td) immunization. / A one-time dose of Tdap vaccine if you are over 65 and have contact with an infant, are a healthcare worker, or simply want to be protected from whooping cough. After that, you need a Td booster dose every 10 years.   Varicella immunization.** / Consult your caregiver.   Meningococcal immunization.** / Consult your caregiver.   Hepatitis A immunization.** / Consult your caregiver. 2 doses, 6 to 18 months apart.   Hepatitis B immunization.** / Check with your caregiver. 3 doses, usually over 6 months.  ** Family history and personal history of risk and conditions may change your caregiver's  recommendations. Document Released: 12/05/2001 Document Revised: 09/28/2011 Document Reviewed: 03/06/2011 [employer] Patient Information 2012 [employer]. 

## 2012-04-01 NOTE — Assessment & Plan Note (Signed)
Nicotrol inhaler sample given to pt 

## 2012-04-01 NOTE — Assessment & Plan Note (Signed)
Check labs 

## 2012-04-01 NOTE — Assessment & Plan Note (Signed)
Check glucose 3 x a day con't meds Check labs

## 2012-04-11 ENCOUNTER — Ambulatory Visit (INDEPENDENT_AMBULATORY_CARE_PROVIDER_SITE_OTHER): Payer: Medicare PPO | Admitting: Family Medicine

## 2012-04-11 ENCOUNTER — Encounter: Payer: Self-pay | Admitting: Family Medicine

## 2012-04-11 VITALS — BP 120/76 | HR 100 | Temp 98.7°F | Wt 173.4 lb

## 2012-04-11 DIAGNOSIS — F438 Other reactions to severe stress: Secondary | ICD-10-CM

## 2012-04-11 DIAGNOSIS — F430 Acute stress reaction: Secondary | ICD-10-CM

## 2012-04-11 DIAGNOSIS — F321 Major depressive disorder, single episode, moderate: Secondary | ICD-10-CM | POA: Insufficient documentation

## 2012-04-11 DIAGNOSIS — R002 Palpitations: Secondary | ICD-10-CM

## 2012-04-11 MED ORDER — METOPROLOL SUCCINATE ER 50 MG PO TB24: ORAL_TABLET | ORAL | Status: DC

## 2012-04-11 MED ORDER — LORAZEPAM 0.5 MG PO TABS: 0.5000 mg | ORAL_TABLET | Freq: Two times a day (BID) | ORAL | Status: AC | PRN

## 2012-04-11 MED ORDER — ATORVASTATIN CALCIUM 10 MG PO TABS: 10.0000 mg | ORAL_TABLET | ORAL | Status: DC

## 2012-04-11 NOTE — Patient Instructions (Signed)
Stress Stress-related medical problems are becoming increasingly common. The body has a built-in physical response to stressful situations. Faced with pressure, challenge or danger, we need to react quickly. Our bodies release hormones such as cortisol and adrenaline to help do this. These hormones are part of the "fight or flight" response and affect the metabolic rate, heart rate and blood pressure, resulting in a heightened, stressed state that prepares the body for optimum performance in dealing with a stressful situation. It is likely that early man required these mechanisms to stay alive, but usually modern stresses do not call for this, and the same hormones released in today's world can damage health and reduce coping ability. CAUSES  Pressure to perform at work, at school or in sports.   Threats of physical violence.   Money worries.   Arguments.   Family conflicts.   Divorce or separation from significant other.   Bereavement.   New job or unemployment.   Changes in location.   Alcohol or drug abuse.  SOMETIMES, THERE IS NO PARTICULAR REASON FOR DEVELOPING STRESS. Almost all people are at risk of being stressed at some time in their lives. It is important to know that some stress is temporary and some is long term.  Temporary stress will go away when a situation is resolved. Most people can cope with short periods of stress, and it can often be relieved by relaxing, taking a walk, chatting through issues with friends, or having a good night's sleep.   Chronic (long-term, continuous) stress is much harder to deal with. It can be psychologically and emotionally damaging. It can be harmful both for an individual and for friends and family.  SYMPTOMS Everyone reacts to stress differently. There are some common effects that help us recognize it. In times of extreme stress, people may:  Shake uncontrollably.   Breathe faster and deeper than normal (hyperventilate).   Vomit.     For people with asthma, stress can trigger an attack.   For some people, stress may trigger migraine headaches, ulcers, and body pain.  PHYSICAL EFFECTS OF STRESS MAY INCLUDE:  Loss of energy.   Skin problems.   Aches and pains resulting from tense muscles, including neck ache, backache and tension headaches.   Increased pain from arthritis and other conditions.   Irregular heart beat (palpitations).   Periods of irritability or anger.   Apathy or depression.   Anxiety (feeling uptight or worrying).   Unusual behavior.   Loss of appetite.   Comfort eating.   Lack of concentration.   Loss of, or decreased, sex-drive.   Increased smoking, drinking, or recreational drug use.   For women, missed periods.   Ulcers, joint pain, and muscle pain.  Post-traumatic stress is the stress caused by any serious accident, strong emotional damage, or extremely difficult or violent experience such as rape or war. Post-traumatic stress victims can experience mixtures of emotions such as fear, shame, depression, guilt or anger. It may include recurrent memories or images that may be haunting. These feelings can last for weeks, months or even years after the traumatic event that triggered them. Specialized treatment, possibly with medicines and psychological therapies, is available. If stress is causing physical symptoms, severe distress or making it difficult for you to function as normal, it is worth seeing your caregiver. It is important to remember that although stress is a usual part of life, extreme or prolonged stress can lead to other illnesses that will need treatment. It is better   to visit a doctor sooner rather than later. Stress has been linked to the development of high blood pressure and heart disease, as well as insomnia and depression. There is no diagnostic test for stress since everyone reacts to it differently. But a caregiver will be able to spot the physical symptoms, such  as:  Headaches.   Shingles.   Ulcers.  Emotional distress such as intense worry, low mood or irritability should be detected when the doctor asks pertinent questions to identify any underlying problems that might be the cause. In case there are physical reasons for the symptoms, the doctor may also want to do some tests to exclude certain conditions. If you feel that you are suffering from stress, try to identify the aspects of your life that are causing it. Sometimes you may not be able to change or avoid them, but even a small change can have a positive ripple effect. A simple lifestyle change can make all the difference. STRATEGIES THAT CAN HELP DEAL WITH STRESS:  Delegating or sharing responsibilities.   Avoiding confrontations.   Learning to be more assertive.   Regular exercise.   Avoid using alcohol or street drugs to cope.   Eating a healthy, balanced diet, rich in fruit and vegetables and proteins.   Finding humor or absurdity in stressful situations.   Never taking on more than you know you can handle comfortably.   Organizing your time better to get as much done as possible.   Talking to friends or family and sharing your thoughts and fears.   Listening to music or relaxation tapes.   Tensing and then relaxing your muscles, starting at the toes and working up to the head and neck.  If you think that you would benefit from help, either in identifying the things that are causing your stress or in learning techniques to help you relax, see a caregiver who is capable of helping you with this. Rather than relying on medications, it is usually better to try and identify the things in your life that are causing stress and try to deal with them. There are many techniques of managing stress including counseling, psychotherapy, aromatherapy, yoga, and exercise. Your caregiver can help you determine what is best for you. Document Released: 12/30/2002 Document Revised: 09/28/2011  Document Reviewed: 11/26/2007 [facility] Patient Information 2012 [place], [place].

## 2012-04-11 NOTE — Assessment & Plan Note (Addendum)
Refer to counselor Ativan 0.5 mg  1 po bid prn
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
## 2012-04-17 ENCOUNTER — Institutional Professional Consult (permissible substitution): Payer: Medicare PPO | Admitting: Cardiology

## 2012-09-25 ENCOUNTER — Ambulatory Visit (INDEPENDENT_AMBULATORY_CARE_PROVIDER_SITE_OTHER): Payer: Medicare PPO | Admitting: Internal Medicine

## 2012-09-25 ENCOUNTER — Encounter: Payer: Self-pay | Admitting: Internal Medicine

## 2012-09-25 VITALS — BP 116/82 | HR 129 | Temp 97.8°F | Wt 173.0 lb

## 2012-09-25 DIAGNOSIS — R000 Tachycardia, unspecified: Secondary | ICD-10-CM

## 2012-09-25 DIAGNOSIS — R002 Palpitations: Secondary | ICD-10-CM

## 2012-09-25 DIAGNOSIS — E785 Hyperlipidemia, unspecified: Secondary | ICD-10-CM

## 2012-09-25 DIAGNOSIS — J019 Acute sinusitis, unspecified: Secondary | ICD-10-CM

## 2012-09-25 MED ORDER — FLUTICASONE PROPIONATE 50 MCG/ACT NA SUSP: 2.0000 | Freq: Every day | NASAL | Status: DC

## 2012-09-25 MED ORDER — AZELASTINE HCL 0.1 % NA SOLN: 1.0000 | Freq: Two times a day (BID) | NASAL | Status: DC

## 2012-09-25 MED ORDER — AMOXICILLIN 500 MG PO CAPS: 1000.0000 mg | ORAL_CAPSULE | Freq: Two times a day (BID) | ORAL | Status: DC

## 2012-09-25 MED ORDER — METOPROLOL SUCCINATE ER 50 MG PO TB24: ORAL_TABLET | ORAL | Status: DC

## 2012-09-25 NOTE — Progress Notes (Signed)
  Subjective:    Patient ID: Emily Maddox, female    DOB: Mar 26, 1971, 41 y.o.   MRN: [id number]  HPI Acute visit Developed a cold approximately 3 weeks ago, sneezing, sinus congestion, mild cough. Sx are better however 1 week ago developed R>L maxillary sinus pain; occasional right ear pain  Past Medical History  Diagnosis Date  . Diabetes mellitus   . Hyperlipidemia   . 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 Hypertension    Past Surgical History  Procedure Date  . Breast lumpectomy 2004    cyst-Left Breast     Review of Systems No fever or chills No nausea, vomiting, diarrhea. No diplopia or eye pain. Noted to be tachycardic, reports that is a chronic issue, has not been taking metoprolol.    Objective:   Physical Exam General -- alert, well-developed Neck --no LADs HEENT -- TMs normal, throat w/o redness, face symmetric and  slt tender to palpation R>L maxilary area, nose moderately  congested   Lungs -- normal respiratory effort, no intercostal retractions, no accessory muscle use, and normal breath sounds.   Heart-- normal rate, regular rhythm, no murmur, and no gallop.     Extremities-- no pretibial edema bilaterally  Psych-- Cognition and judgment appear intact. Alert and cooperative with normal attention span and concentration.  not anxious appearing and not depressed appearing.       Assessment & Plan:

## 2012-09-25 NOTE — Assessment & Plan Note (Addendum)
Reports chronic tachycardia, off metoprolol today. Recommend to restart BB

## 2012-09-25 NOTE — Patient Instructions (Addendum)
Rest, fluids , tylenol Use flonase and astelin nasal spray as prescribed at least until you feel better Take the antibiotic as prescribed  (Amoxicillin) Call if no better in few days Call anytime if the symptoms are severe

## 2012-09-25 NOTE — Assessment & Plan Note (Signed)
Currently taking no medications. Will let PCP know

## 2012-09-25 NOTE — Assessment & Plan Note (Signed)
Symptoms consistent with sinusitis, see instructions

## 2012-10-10 ENCOUNTER — Other Ambulatory Visit: Payer: Self-pay | Admitting: Family Medicine

## 2012-10-28 ENCOUNTER — Encounter: Payer: Self-pay | Admitting: Family Medicine

## 2012-10-28 ENCOUNTER — Ambulatory Visit (INDEPENDENT_AMBULATORY_CARE_PROVIDER_SITE_OTHER): Payer: Medicare PPO | Admitting: Family Medicine

## 2012-10-28 VITALS — BP 116/80 | HR 114 | Temp 98.8°F | Wt 171.0 lb

## 2012-10-28 DIAGNOSIS — E1129 Type 2 diabetes mellitus with other diabetic kidney complication: Secondary | ICD-10-CM

## 2012-10-28 DIAGNOSIS — E119 Type 2 diabetes mellitus without complications: Secondary | ICD-10-CM

## 2012-10-28 DIAGNOSIS — R000 Tachycardia, unspecified: Secondary | ICD-10-CM

## 2012-10-28 DIAGNOSIS — N058 Unspecified nephritic syndrome with other morphologic changes: Secondary | ICD-10-CM

## 2012-10-28 DIAGNOSIS — E785 Hyperlipidemia, unspecified: Secondary | ICD-10-CM

## 2012-10-28 LAB — BASIC METABOLIC PANEL
BUN: 12 mg/dL (ref 6–23)
CO2: 26 mEq/L (ref 19–32)
Chloride: 105 mEq/L (ref 96–112)
Creatinine, Ser: 1 mg/dL (ref 0.4–1.2)
Glucose, Bld: 101 mg/dL — ABNORMAL HIGH (ref 70–99)

## 2012-10-28 LAB — HEPATIC FUNCTION PANEL
ALT: 9 U/L (ref 0–35)
AST: 18 U/L (ref 0–37)
Total Bilirubin: 0.6 mg/dL (ref 0.3–1.2)
Total Protein: 7.5 g/dL (ref 6.0–8.3)

## 2012-10-28 LAB — LDL CHOLESTEROL, DIRECT: Direct LDL: 146.5 mg/dL

## 2012-10-28 LAB — LIPID PANEL
Cholesterol: 207 mg/dL — ABNORMAL HIGH (ref 0–200)
Total CHOL/HDL Ratio: 4

## 2012-10-28 LAB — MICROALBUMIN / CREATININE URINE RATIO: Microalb Creat Ratio: 0.3 mg/g (ref 0.0–30.0)

## 2012-10-28 NOTE — Assessment & Plan Note (Signed)
Check labs Cont' meds 

## 2012-10-28 NOTE — Assessment & Plan Note (Signed)
Pt never inc metoprolol She will do starting today Recheck 6 months

## 2012-10-28 NOTE — Assessment & Plan Note (Signed)
Check labs con't meds 

## 2012-10-28 NOTE — Patient Instructions (Addendum)
Diabetes and Sick Day Management Blood sugar (glucose) can be more difficult to control when you are sick. Colds, fever, flu, nausea, vomiting, and diarrhea are all examples of common illnesses that can cause problems for people with diabetes. Loss of body fluids (dehydration) from fever, vomiting, diarrhea, infection, and the stress of a sickness can all cause blood glucose levels to rise. Because of this, it is very important to take your diabetes medicines and to eat some form of carbohydrate food when you are sick. Liquid or soft foods are often tolerated, and they help to replace fluids. HOME CARE INSTRUCTIONS These main guidelines are intended for managing a short-term (24 hours or less) sickness:  Take your usual dose of insulin or oral diabetes medicine. An exception would be if you take any form of metformin. If you cannot eat or drink, you can become dehydrated and should not take this medicine.  Continue to take your insulin even if you are unable to eat solid foods or are vomiting. Your insulin dose may stay the same, or it may need to be increased when you are sick.  You will need to test your blood glucose more often, generally every 2 to 4 hours. If you have type 1 diabetes, test your urine for ketones every 4 hours. If you have type 2 diabetes, test your ketones as directed by your caregiver.  Eat some form of food that contains carbohydrates. The carbohydrates can be in solid or liquid form. You should eat 45 to 50 grams of carbohydrates every 3 to 4 hours.  Replace fluids if fever, vomiting, or diarrhea is present. Ask your caregiver for specific rehydration instructions.  Watch carefully for the signs of ketoacidosis if you have type 1 diabetes. Call your caregiver if any of the following symptoms are present, especially in children:  Moderate to large ketones in the urine along with a high blood glucose level.  Severe nausea.  Vomiting.  Diarrhea.  Abdominal  pain.  Rapid breathing.  Drink extra liquids that do not contain sugar, such as water or sugar-free liquids, if your blood glucose is higher than 240 mg/dl.  Be careful with over-the-counter medicines. Read the labels. They may contain sugar or types of sugars that can raise your blood glucose. Food Choices for Illness All of the food choices below contain around 15 grams of carbohydrates. Plan ahead and keep some of these foods around.    to  cup carbonated beverage containing sugar. Carbonated beverages will usually be better tolerated if they are opened and left at room temperature for a few minutes.   of a twin frozen ice pop.   cup sweetened gelatin dessert.   cup juice.   cup ice cream or frozen yogurt.   cup cooked cereal.   cup sherbet.  1 cup broth-based soup with noodles or rice, reconstituted with water.  1 cup cream soup.   cup regular custard.   cup regular pudding.  1 cup sports drink.  1 cup plain yogurt.  1 slice toast.  6 squares saltine crackers.  5 vanilla wafers. SEEK MEDICAL CARE IF:   You are sick and see no improvement in 6 to 8 hours.  You are unable to eat regular foods for more than 24 hours.  You have blood glucose readings over 240 mg/dl, 2 times in a row.  Your blood glucose is less than 70 mg/dl, 2 times in a row.  You are not sure what to do to take care of   yourself.  You vomit more than once in 4 to 6 hours.  You have moderate or large ketones in your urine.  You have a fever.  Your symptoms are getting worse even though you are following your caregiver's instructions. Document Released: 10/12/2003 Document Revised: 01/01/2012 Document Reviewed: 04/18/2011 Exit[employer] Patient Information 2013 [employer].  

## 2012-10-28 NOTE — Progress Notes (Signed)
  Subjective:    Patient ID: Emily Maddox, female    DOB: [birth year], 42 y.o.   MRN: [id number]  HPI HYPERTENSION/ tachycardia Disease Monitoring Blood pressure range-good Chest pain- no      Dyspnea- no Medications Compliance- good Lightheadedness- no   Edema- no   DIABETES Disease Monitoring Blood Sugar ranges-good Polyuria- no New Visual problems- no Medications Compliance- good Hypoglycemic symptoms- no   HYPERLIPIDEMIA Disease Monitoring See symptoms for Hypertension Medications Compliance- good RUQ pain- no  Muscle aches- no  ROS See HPI above   PMH Smoking Status noted     Review of Systems As above    Objective:   Physical Exam  BP 116/80  Pulse 114  Temp 98.8 F (37.1 C) (Oral)  Wt 171 lb (77.565 kg)  SpO2 99% General appearance: alert, cooperative, appears stated age and no distress Lungs: clear to auscultation bilaterally Heart: S1, S2 normal Extremities: extremities normal, atraumatic, no cyanosis or edema Sensory exam of the foot is normal, tested with the monofilament. Good pulses, no lesions or ulcers, good peripheral pulses.       Assessment & Plan:

## 2012-11-22 ENCOUNTER — Encounter: Payer: Self-pay | Admitting: Family Medicine

## 2012-11-22 ENCOUNTER — Ambulatory Visit (INDEPENDENT_AMBULATORY_CARE_PROVIDER_SITE_OTHER): Payer: Medicare PPO | Admitting: Family Medicine

## 2012-11-22 VITALS — BP 120/82 | HR 75 | Wt 175.0 lb

## 2012-11-22 DIAGNOSIS — M545 Low back pain, unspecified: Secondary | ICD-10-CM

## 2012-11-22 DIAGNOSIS — F430 Acute stress reaction: Secondary | ICD-10-CM

## 2012-11-23 NOTE — Assessment & Plan Note (Signed)
Hx of severe depression---pt doing well  Not working is just making things work

## 2012-11-23 NOTE — Assessment & Plan Note (Signed)
Part of the reason for her disablity--- pt is doing well and has a strong desire to work Letter written

## 2012-11-23 NOTE — Progress Notes (Signed)
  Subjective:    Patient ID: Emily Maddox, female    DOB: [birth year], 42 y.o.   MRN: [id number]  HPI Pt here to discuss her disibility and her desire to work.  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  See paperwork scanned in.   Review of Systems As above    Objective:   Physical Exam BP 120/82  Pulse 75  Wt 175 lb (79.379 kg)  SpO2 96% General appearance: alert, cooperative and appears stated age       Assessment & Plan:

## 2013-01-30 ENCOUNTER — Encounter: Payer: Self-pay | Admitting: Family Medicine

## 2013-01-30 ENCOUNTER — Ambulatory Visit (INDEPENDENT_AMBULATORY_CARE_PROVIDER_SITE_OTHER): Payer: Medicare PPO | Admitting: Family Medicine

## 2013-01-30 VITALS — BP 114/74 | HR 100 | Temp 98.6°F | Wt 171.6 lb

## 2013-01-30 DIAGNOSIS — F4323 Adjustment disorder with mixed anxiety and depressed mood: Secondary | ICD-10-CM

## 2013-01-30 DIAGNOSIS — F411 Generalized anxiety disorder: Secondary | ICD-10-CM

## 2013-01-30 MED ORDER — ALPRAZOLAM 0.25 MG PO TABS: 0.2500 mg | ORAL_TABLET | Freq: Three times a day (TID) | ORAL | Status: DC | PRN

## 2013-01-30 MED ORDER — VILAZODONE HCL 40 MG PO TABS: 40.0000 mg | ORAL_TABLET | Freq: Every day | ORAL | Status: DC

## 2013-01-30 NOTE — Patient Instructions (Addendum)

## 2013-01-30 NOTE — Progress Notes (Signed)
  Subjective:     Emily Maddox is a 42 y.o. female who presents for new evaluation and treatment of anxiety disorder. She has the following anxiety symptoms: difficulty concentrating, fatigue, insomnia, irritable, panic attacks, racing thoughts and shortness of breath. Onset of symptoms was approximately several weeks ago. Symptoms have been gradually worsening since that time. She denies current suicidal and homicidal ideation. Family history significant for no psychiatric illness. Risk factors: negative life event but none recent. Previous treatment includes BuSpar, Celexa, Lexapro, Paxil, Prozac, Wellbutrin, Xanax and Zoloft. She complains of the following medication side effects: just didn't work. The following portions of the patient's history were reviewed and updated as appropriate: allergies, current medications, past family history, past medical history, past social history, past surgical history and problem list.  Review of Systems Pertinent items are noted in HPI.    Objective:    BP 114/74  Pulse 100  Temp(Src) 98.6 F (37 C) (Oral)  Wt 171 lb 9.6 oz (77.837 kg)  BMI 27.29 kg/m2  SpO2 99% General appearance: alert, cooperative, appears stated age and no distress Psych--+ anxiety and depression,  No suicidal or homicidal tendencies      Assessment:    anxiety disorder and panic attacks. Possible organic contributing causes are: none.   Plan:    Medications: Xanax and viibryd. Continue with counseling. Recommended transfer to psychiatry for medical management. List of Psychiatrists recommended. Handouts describing disease, natural history, and treatment were given to the patient. Instructed patient to contact office or on-call physician promptly should condition worsen or any new symptoms appear and provided on-call telephone numbers. IF THE PATIENT HAS ANY SUICIDAL OR HOMICIDAL IDEATIONS, CALL THE OFFICE, DISCUSS WITH A SUPPORT MEMBER, OR GO TO THE ER IMMEDIATELY. Patient  was agreeable with this plan. Follow up: 1 month. --or sooner prn

## 2013-02-05 ENCOUNTER — Telehealth: Payer: Self-pay

## 2013-02-05 DIAGNOSIS — Z79899 Other long term (current) drug therapy: Secondary | ICD-10-CM

## 2013-02-05 DIAGNOSIS — F4323 Adjustment disorder with mixed anxiety and depressed mood: Secondary | ICD-10-CM

## 2013-02-05 NOTE — Telephone Encounter (Signed)
Message copied by Arnette Norris on Wed Feb 05, 2013  4:57 PM ------      Message from: Donzetta Kohut B      Created: Wed Feb 05, 2013  9:44 AM      Regarding: psych referral       Referral was entered to [facility] for medication mgmt on this patient.  Per call from Terri/Leb Erik Obey, it was placed into their work que, and they are only psychology.  In the "Referred To" portion of referral for [facility]/Psychiatry/Psychology, please do not enter anything in the "Department" blank, as this clears the referral completely from our viewing.  This applies to Psych referrals only.              Please enter a new psychiatry referral for this patient. ------

## 2013-02-05 NOTE — Telephone Encounter (Signed)
Done  KP

## 2013-02-17 ENCOUNTER — Telehealth (HOSPITAL_COMMUNITY): Payer: Self-pay

## 2013-02-17 NOTE — Telephone Encounter (Signed)
10:48am 02/17/13 Called and left msg that provider had an open slot for 04/29 @ 11am - waiting for a call back/sh

## 2013-02-18 ENCOUNTER — Encounter (HOSPITAL_COMMUNITY): Payer: Self-pay | Admitting: Psychiatry

## 2013-02-18 ENCOUNTER — Ambulatory Visit (INDEPENDENT_AMBULATORY_CARE_PROVIDER_SITE_OTHER): Payer: Medicare PPO | Admitting: Psychiatry

## 2013-02-18 VITALS — BP 126/91 | HR 98 | Ht 67.0 in | Wt 171.2 lb

## 2013-02-18 DIAGNOSIS — F32A Depression, unspecified: Secondary | ICD-10-CM

## 2013-02-18 DIAGNOSIS — F329 Major depressive disorder, single episode, unspecified: Secondary | ICD-10-CM
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
## 2013-02-21 ENCOUNTER — Ambulatory Visit (INDEPENDENT_AMBULATORY_CARE_PROVIDER_SITE_OTHER): Payer: Medicare PPO | Admitting: Psychiatry

## 2013-02-21 ENCOUNTER — Encounter (HOSPITAL_COMMUNITY): Payer: Self-pay | Admitting: Psychiatry

## 2013-02-21 DIAGNOSIS — F329 Major depressive disorder, single episode, unspecified: Secondary | ICD-10-CM

## 2013-02-21 DIAGNOSIS — F32A Depression, unspecified: Secondary | ICD-10-CM
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
## 2013-02-25 ENCOUNTER — Ambulatory Visit (HOSPITAL_COMMUNITY): Payer: Medicare PPO | Admitting: Psychiatry

## 2013-02-27 ENCOUNTER — Other Ambulatory Visit (HOSPITAL_COMMUNITY): Payer: Self-pay | Admitting: Psychiatry

## 2013-02-27 ENCOUNTER — Telehealth (HOSPITAL_COMMUNITY): Payer: Self-pay | Admitting: *Deleted

## 2013-02-27 ENCOUNTER — Telehealth (HOSPITAL_COMMUNITY): Payer: Self-pay

## 2013-02-27 ENCOUNTER — Telehealth (HOSPITAL_COMMUNITY): Payer: Self-pay | Admitting: Psychiatry

## 2013-02-27 MED ORDER — HYDROXYZINE PAMOATE 25 MG PO CAPS: ORAL_CAPSULE | ORAL | Status: DC

## 2013-02-27 NOTE — Telephone Encounter (Signed)
Needs something to help with her sleep. Able to fall asleep, but wakes up a lot during night, sometimes cannot go back to sleep. Therapist recommended she contact MD about this.

## 2013-02-27 NOTE — Telephone Encounter (Signed)
Call return and left message. 

## 2013-02-27 NOTE — Telephone Encounter (Signed)
I returned her call.  Patient complaining of insomnia.  Despite taking antidepressant she still not sleeping very well.  She is wondering if she can take something for sleep.  She never tried Vistaril before.  I recommend to try Vistaril 25 mg 1-2 capsule as needed for insomnia.  Recommend to call us back if she experience any concern or if she has any questions.

## 2013-02-28 ENCOUNTER — Telehealth: Payer: Self-pay | Admitting: Family Medicine

## 2013-02-28 ENCOUNTER — Ambulatory Visit (INDEPENDENT_AMBULATORY_CARE_PROVIDER_SITE_OTHER): Payer: Medicare PPO | Admitting: Family Medicine

## 2013-02-28 ENCOUNTER — Encounter: Payer: Self-pay | Admitting: Family Medicine

## 2013-02-28 ENCOUNTER — Ambulatory Visit: Payer: Self-pay | Admitting: Internal Medicine

## 2013-02-28 VITALS — BP 130/80 | HR 108 | Temp 98.7°F | Wt 170.8 lb

## 2013-02-28 DIAGNOSIS — E118 Type 2 diabetes mellitus with unspecified complications: Secondary | ICD-10-CM

## 2013-02-28 DIAGNOSIS — I10 Essential (primary) hypertension: Secondary | ICD-10-CM

## 2013-02-28 DIAGNOSIS — E1165 Type 2 diabetes mellitus with hyperglycemia: Secondary | ICD-10-CM

## 2013-02-28 DIAGNOSIS — J019 Acute sinusitis, unspecified: Secondary | ICD-10-CM

## 2013-02-28 MED ORDER — CEFUROXIME AXETIL 500 MG PO TABS: 500.0000 mg | ORAL_TABLET | Freq: Two times a day (BID) | ORAL | Status: AC

## 2013-02-28 NOTE — Telephone Encounter (Signed)
Pt states she does not have time to come in for an appointment.

## 2013-02-28 NOTE — Patient Instructions (Addendum)

## 2013-02-28 NOTE — Telephone Encounter (Signed)
Patient called stating she has the same symptoms she was seen for in December and would like an antibiotic sent to [facility] on [address]

## 2013-02-28 NOTE — Telephone Encounter (Signed)
Pt advised OV will be needed, appt scheduled for today with Dr Alwyn Ren.

## 2013-02-28 NOTE — Progress Notes (Signed)
  Subjective:     Emily Maddox is a 42 y.o. female who presents for evaluation of sinus pain. Symptoms include: congestion, facial pain, nasal congestion, post nasal drip and sinus pressure. Onset of symptoms was 1 month ago. Symptoms have been gradually worsening since that time. Past history is significant for no history of pneumonia or bronchitis. Patient is a non-smoker.  The following portions of the patient's history were reviewed and updated as appropriate: allergies, current medications, past family history, past medical history, past social history, past surgical history and problem list.  Review of Systems Pertinent items are noted in HPI.   Objective:    BP 130/80  Pulse 108  Temp(Src) 98.7 F (37.1 C) (Oral)  Wt 170 lb 12.8 oz (77.474 kg)  BMI 26.74 kg/m2  SpO2 98% General appearance: alert, cooperative, appears stated age and no distress Ears: normal TM's and external ear canals both ears Nose: green discharge, moderate congestion, turbinates red, swollen, sinus tenderness right Throat: abnormal findings: mild oropharyngeal erythema and + errythema Neck: mild anterior cervical adenopathy, supple, symmetrical, trachea midline and thyroid not enlarged, symmetric, no tenderness/mass/nodules Lungs: clear to auscultation bilaterally Heart: S1, S2 normal    Assessment:    Acute bacterial sinusitis.    Plan:    Nasal steroids per medication orders. Antihistamines per medication orders. Ceftin per medication orders.

## 2013-03-12 ENCOUNTER — Other Ambulatory Visit (INDEPENDENT_AMBULATORY_CARE_PROVIDER_SITE_OTHER): Payer: Medicare PPO

## 2013-03-12 DIAGNOSIS — IMO0002 Reserved for concepts with insufficient information to code with codable children: Secondary | ICD-10-CM

## 2013-03-12 DIAGNOSIS — E118 Type 2 diabetes mellitus with unspecified complications: Secondary | ICD-10-CM

## 2013-03-12 DIAGNOSIS — I10 Essential (primary) hypertension: Secondary | ICD-10-CM

## 2013-03-12 DIAGNOSIS — E1165 Type 2 diabetes mellitus with hyperglycemia: Secondary | ICD-10-CM

## 2013-03-12 LAB — HEPATIC FUNCTION PANEL
AST: 14 U/L (ref 0–37)
Bilirubin, Direct: 0 mg/dL (ref 0.0–0.3)
Total Bilirubin: 0.4 mg/dL (ref 0.3–1.2)

## 2013-03-12 LAB — MICROALBUMIN / CREATININE URINE RATIO
Creatinine,U: 225.7 mg/dL
Microalb, Ur: 1.5 mg/dL (ref 0.0–1.9)

## 2013-03-12 LAB — BASIC METABOLIC PANEL
CO2: 28 mEq/L (ref 19–32)
Chloride: 106 mEq/L (ref 96–112)
Creatinine, Ser: 0.8 mg/dL (ref 0.4–1.2)
Sodium: 139 mEq/L (ref 135–145)

## 2013-03-12 LAB — HEMOGLOBIN A1C: Hgb A1c MFr Bld: 6 % (ref 4.6–6.5)

## 2013-03-12 LAB — LIPID PANEL
Cholesterol: 206 mg/dL — ABNORMAL HIGH (ref 0–200)
Total CHOL/HDL Ratio: 3
Triglycerides: 50 mg/dL (ref 0.0–149.0)
VLDL: 10 mg/dL (ref 0.0–40.0)

## 2013-03-13 LAB — POCT URINALYSIS DIPSTICK
Bilirubin, UA: NEGATIVE
Blood, UA: NEGATIVE
Glucose, UA: NEGATIVE
Spec Grav, UA: 1.015
Urobilinogen, UA: 0.2
pH, UA: 6.5

## 2013-03-14 ENCOUNTER — Telehealth: Payer: Self-pay

## 2013-03-14 MED ORDER — SIMVASTATIN 20 MG PO TABS: 20.0000 mg | ORAL_TABLET | Freq: Every day | ORAL | Status: DC

## 2013-03-14 MED ORDER — OMEPRAZOLE 20 MG PO CPDR: 20.0000 mg | DELAYED_RELEASE_CAPSULE | Freq: Every day | ORAL | Status: DC

## 2013-03-14 NOTE — Telephone Encounter (Signed)
Call from patient and she stated she was taking Zantac and it is not helping and she would like to change back to Omeprazole. Please advise     KP

## 2013-03-14 NOTE — Telephone Encounter (Signed)
Ok to change back to omeprazole #90  3 refills 1 po qd

## 2013-03-24 ENCOUNTER — Encounter: Payer: Self-pay | Admitting: Family Medicine

## 2013-03-24 ENCOUNTER — Ambulatory Visit (INDEPENDENT_AMBULATORY_CARE_PROVIDER_SITE_OTHER): Payer: Medicare PPO | Admitting: Family Medicine

## 2013-03-24 VITALS — BP 116/72 | HR 80 | Temp 99.0°F | Wt 168.8 lb

## 2013-03-24 DIAGNOSIS — M549 Dorsalgia, unspecified: Secondary | ICD-10-CM

## 2013-03-24 MED ORDER — CYCLOBENZAPRINE HCL 10 MG PO TABS: 10.0000 mg | ORAL_TABLET | Freq: Three times a day (TID) | ORAL | Status: DC | PRN
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
## 2013-03-24 NOTE — Patient Instructions (Signed)
Back Pain, Adult  Low back pain is very common. About 1 in 5 people have back pain. The cause of low back pain is rarely dangerous. The pain often gets better over time. About half of people with a sudden onset of back pain feel better in just 2 weeks. About 8 in 10 people feel better by 6 weeks.   CAUSES  Some common causes of back pain include:  · Strain of the muscles or ligaments supporting the spine.  · Wear and tear (degeneration) of the spinal discs.  · Arthritis.  · Direct injury to the back.  DIAGNOSIS  Most of the time, the direct cause of low back pain is not known. However, back pain can be treated effectively even when the exact cause of the pain is unknown. Answering your caregiver's questions about your overall health and symptoms is one of the most accurate ways to make sure the cause of your pain is not dangerous. If your caregiver needs more information, he or she may order lab work or imaging tests (X-rays or MRIs). However, even if imaging tests show changes in your back, this usually does not require surgery.  HOME CARE INSTRUCTIONS  For many people, back pain returns. Since low back pain is rarely dangerous, it is often a condition that people can learn to manage on their own.   · Remain active. It is stressful on the back to sit or stand in one place. Do not sit, drive, or stand in one place for more than 30 minutes at a time. Take short walks on level surfaces as soon as pain allows. Try to increase the length of time you walk each day.  · Do not stay in bed. Resting more than 1 or 2 days can delay your recovery.  · Do not avoid exercise or work. Your body is made to move. It is not dangerous to be active, even though your back may hurt. Your back will likely heal faster if you return to being active before your pain is gone.  · Pay attention to your body when you  bend and lift. Many people have less discomfort when lifting if they bend their knees, keep the load close to their bodies, and  avoid twisting. Often, the most comfortable positions are those that put less stress on your recovering back.  · Find a comfortable position to sleep. Use a firm mattress and lie on your side with your knees slightly bent. If you lie on your back, put a pillow under your knees.  · Only take over-the-counter or prescription medicines as directed by your caregiver. Over-the-counter medicines to reduce pain and inflammation are often the most helpful. Your caregiver may prescribe muscle relaxant drugs. These medicines help dull your pain so you can more quickly return to your normal activities and healthy exercise.  · Put ice on the injured area.  · Put ice in a plastic bag.  · Place a towel between your skin and the bag.  · Leave the ice on for 15-20 minutes, 3-4 times a day for the first 2 to 3 days. After that, ice and heat may be alternated to reduce pain and spasms.  · Ask your caregiver about trying back exercises and gentle massage. This may be of some benefit.  · Avoid feeling anxious or stressed. Stress increases muscle tension and can worsen back pain. It is important to recognize when you are anxious or stressed and learn ways to manage it. Exercise is a great option.  SEEK MEDICAL CARE IF:  · You have pain that is not relieved with rest or   medicine.  · You have pain that does not improve in 1 week.  · You have new symptoms.  · You are generally not feeling well.  SEEK IMMEDIATE MEDICAL CARE IF:   · You have pain that radiates from your back into your legs.  · You develop new bowel or bladder control problems.  · You have unusual weakness or numbness in your arms or legs.  · You develop nausea or vomiting.  · You develop abdominal pain.  · You feel faint.  Document Released: 10/09/2005 Document Revised: 04/09/2012 Document Reviewed: 02/27/2011  [employer]® Patient Information ©2014 [employer].

## 2013-05-01 ENCOUNTER — Other Ambulatory Visit: Payer: Self-pay

## 2013-05-05 ENCOUNTER — Other Ambulatory Visit: Payer: Self-pay | Admitting: Family Medicine

## 2013-05-22 ENCOUNTER — Ambulatory Visit (INDEPENDENT_AMBULATORY_CARE_PROVIDER_SITE_OTHER): Payer: Medicare PPO | Admitting: Family Medicine

## 2013-05-22 ENCOUNTER — Encounter: Payer: Self-pay | Admitting: Family Medicine

## 2013-05-22 VITALS — BP 108/80 | HR 84 | Temp 98.0°F | Wt 172.4 lb

## 2013-05-22 DIAGNOSIS — M549 Dorsalgia, unspecified: Secondary | ICD-10-CM

## 2013-05-22 DIAGNOSIS — M5126 Other intervertebral disc displacement, lumbar region: Secondary | ICD-10-CM

## 2013-05-22 MED ORDER — NAPROXEN 500 MG PO TABS: 500.0000 mg | ORAL_TABLET | Freq: Two times a day (BID) | ORAL | Status: DC

## 2013-05-22 MED ORDER — CYCLOBENZAPRINE HCL 10 MG PO TABS: 10.0000 mg | ORAL_TABLET | Freq: Three times a day (TID) | ORAL | Status: DC | PRN

## 2013-05-22 MED ORDER — TRAMADOL HCL 50 MG PO TABS: 50.0000 mg | ORAL_TABLET | Freq: Three times a day (TID) | ORAL | Status: DC | PRN

## 2013-05-22 NOTE — Assessment & Plan Note (Signed)
New.  MRI indicates L4/L5 disc bulge w/ foraminal narrowing.  Pt having progressive weakness and numbness.  No relief w/ steroids- this also elevates sugars in known diabetic.  Pt intolerant of NSAIDs.  Start Tramadol for pain, restart flexeril.  Refer to neurosurg.  Reviewed supportive care and red flags that should prompt return.  Pt expressed understanding and is in agreement w/ plan.

## 2013-05-22 NOTE — Patient Instructions (Signed)
We'll notify you of your neurosurgery referral Restart the flexeril as needed Take the Naproxen twice daily- take w/ food Call with any questions or concerns Hang in there!
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
## 2013-06-11 ENCOUNTER — Telehealth: Payer: Self-pay | Admitting: Family Medicine

## 2013-06-11 NOTE — Telephone Encounter (Signed)
Patient wants to let us know that her Neurologist, Dr. Marikay Alar, is telling her that her current symptoms are not neurological according to her MRI results. She says he seems to think her issues are related to her pelvis and hip area and advised to to contact us for next step in plan of care.
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
## 2013-06-11 NOTE — Telephone Encounter (Signed)
To MD for review     KP 

## 2013-06-11 NOTE — Telephone Encounter (Signed)
We don't have their note--- we probably need that but it sounds like ortho issue?

## 2013-06-11 NOTE — Telephone Encounter (Signed)
noted 

## 2013-07-07 ENCOUNTER — Encounter: Payer: Self-pay | Admitting: Lab

## 2013-07-08 ENCOUNTER — Ambulatory Visit (INDEPENDENT_AMBULATORY_CARE_PROVIDER_SITE_OTHER): Payer: Medicare PPO | Admitting: Family Medicine

## 2013-07-08 ENCOUNTER — Encounter: Payer: Self-pay | Admitting: Family Medicine

## 2013-07-08 VITALS — BP 132/84 | HR 113 | Temp 98.3°F | Wt 172.6 lb

## 2013-07-08 DIAGNOSIS — E1165 Type 2 diabetes mellitus with hyperglycemia: Secondary | ICD-10-CM

## 2013-07-08 DIAGNOSIS — IMO0002 Reserved for concepts with insufficient information to code with codable children: Secondary | ICD-10-CM

## 2013-07-08 DIAGNOSIS — E785 Hyperlipidemia, unspecified: Secondary | ICD-10-CM

## 2013-07-08 DIAGNOSIS — M545 Low back pain, unspecified: Secondary | ICD-10-CM

## 2013-07-08 NOTE — Patient Instructions (Addendum)
Back Pain, Adult Low back pain is very common. About 1 in 5 people have back pain.The cause of low back pain is rarely dangerous. The pain often gets better over time.About half of people with a sudden onset of back pain feel better in just 2 weeks. About 8 in 10 people feel better by 6 weeks.  CAUSES Some common causes of back pain include:  Strain of the muscles or ligaments supporting the spine.  Wear and tear (degeneration) of the spinal discs.  Arthritis.  Direct injury to the back. DIAGNOSIS Most of the time, the direct cause of low back pain is not known.However, back pain can be treated effectively even when the exact cause of the pain is unknown.Answering your caregiver's questions about your overall health and symptoms is one of the most accurate ways to make sure the cause of your pain is not dangerous. If your caregiver needs more information, he or she may order lab work or imaging tests (X-rays or MRIs).However, even if imaging tests show changes in your back, this usually does not require surgery. HOME CARE INSTRUCTIONS For many people, back pain returns.Since low back pain is rarely dangerous, it is often a condition that people can learn to manageon their own.   Remain active. It is stressful on the back to sit or stand in one place. Do not sit, drive, or stand in one place for more than 30 minutes at a time. Take short walks on level surfaces as soon as pain allows.Try to increase the length of time you walk each day.  Do not stay in bed.Resting more than 1 or 2 days can delay your recovery.  Do not avoid exercise or work.Your body is made to move.It is not dangerous to be active, even though your back may hurt.Your back will likely heal faster if you return to being active before your pain is gone.  Pay attention to your body when you bend and lift. Many people have less discomfortwhen lifting if they bend their knees, keep the load close to their bodies,and  avoid twisting. Often, the most comfortable positions are those that put less stress on your recovering back.  Find a comfortable position to sleep. Use a firm mattress and lie on your side with your knees slightly bent. If you lie on your back, put a pillow under your knees.  Only take over-the-counter or prescription medicines as directed by your caregiver. Over-the-counter medicines to reduce pain and inflammation are often the most helpful.Your caregiver may prescribe muscle relaxant drugs.These medicines help dull your pain so you can more quickly return to your normal activities and healthy exercise.  Put ice on the injured area.  Put ice in a plastic bag.  Place a towel between your skin and the bag.  Leave the ice on for 15-20 minutes, 3-4 times a day for the first 2 to 3 days. After that, ice and heat may be alternated to reduce pain and spasms.  Ask your caregiver about trying back exercises and gentle massage. This may be of some benefit.  Avoid feeling anxious or stressed.Stress increases muscle tension and can worsen back pain.It is important to recognize when you are anxious or stressed and learn ways to manage it.Exercise is a great option. SEEK MEDICAL CARE IF:  You have pain that is not relieved with rest or medicine.  You have pain that does not improve in 1 week.  You have new symptoms.  You are generally not feeling well. SEEK   IMMEDIATE MEDICAL CARE IF:   You have pain that radiates from your back into your legs.  You develop new bowel or bladder control problems.  You have unusual weakness or numbness in your arms or legs.  You develop nausea or vomiting.  You develop abdominal pain.  You feel faint. Document Released: 10/09/2005 Document Revised: 04/09/2012 Document Reviewed: 02/27/2011 [facility] Patient Information 2014 [place], [place].   Sliding scale insulin to start day of steroid injection if needed--- check blood sugar 4x a day (fasting and  2h after meals)  200-250  2 u 251-300 4 u 301-350 6 u 351-400 8 u > 400 10 u and call Dr

## 2013-07-08 NOTE — Progress Notes (Signed)
  Subjective:    Emily Maddox is a 42 y.o. female who presents for follow up of low back problems. Current symptoms include: numbness in both thighs, pain in low back (aching, numbing and shooting in character; 8/10 in severity) and weakness in r leg. Symptoms have not changed from the previous visit with neurosurgeon. Exacerbating factors identified by the patient are sitting, standing and walking.  The following portions of the patient's history were reviewed and updated as appropriate: allergies, current medications, past family history, past medical history, past social history, past surgical history and problem list.    Objective:    BP 132/84  Pulse 113  Temp(Src) 98.3 F (36.8 C) (Oral)  Wt 172 lb 9.6 oz (78.291 kg)  BMI 27.03 kg/m2  SpO2 99% General appearance: alert, cooperative, appears stated age and no distress Extremities: extremities normal, atraumatic, no cyanosis or edema Neurologic: Sensory: normal Motor: grossly normal Reflexes: 2+ and symmetric Coordination: normal Gait: Antalgic    Assessment:    Nonspecific acute low back pain    Plan:    Educational material distributed. Short (2-4 day) period of relative rest recommended until acute symptoms improve. Ice to affected area as needed for local pain relief. Heat to affected area as needed for local pain relief. f/u with pain management  Pt upset with NS evaluation-- but injection is scheduled for first week in oct Pulse and bp up some because pt was very upset when she got here--upset that she was in so much pain and Ns said it was her DM (which has been well controlled) or in her head.

## 2013-07-11 ENCOUNTER — Other Ambulatory Visit (INDEPENDENT_AMBULATORY_CARE_PROVIDER_SITE_OTHER): Payer: Medicare PPO

## 2013-07-11 DIAGNOSIS — E1165 Type 2 diabetes mellitus with hyperglycemia: Secondary | ICD-10-CM

## 2013-07-11 DIAGNOSIS — M545 Low back pain, unspecified: Secondary | ICD-10-CM

## 2013-07-11 DIAGNOSIS — E785 Hyperlipidemia, unspecified: Secondary | ICD-10-CM

## 2013-07-11 LAB — LIPID PANEL
Cholesterol: 219 mg/dL — ABNORMAL HIGH (ref 0–200)
HDL: 59.4 mg/dL (ref >39.00)
Total CHOL/HDL Ratio: 4
Triglycerides: 68 mg/dL (ref 0.0–149.0)
VLDL: 13.6 mg/dL (ref 0.0–40.0)

## 2013-07-11 LAB — POCT URINALYSIS DIPSTICK
Blood, UA: NEGATIVE
Nitrite, UA: NEGATIVE
Protein, UA: NEGATIVE
Spec Grav, UA: 1.02
Urobilinogen, UA: 0.2
pH, UA: 7

## 2013-07-11 LAB — BASIC METABOLIC PANEL
BUN: 8 mg/dL (ref 6–23)
CO2: 27 mEq/L (ref 19–32)
Chloride: 106 mEq/L (ref 96–112)
Creatinine, Ser: 0.9 mg/dL (ref 0.4–1.2)

## 2013-07-11 LAB — HEPATIC FUNCTION PANEL
AST: 14 U/L (ref 0–37)
Albumin: 3.9 g/dL (ref 3.5–5.2)
Total Bilirubin: 0.3 mg/dL (ref 0.3–1.2)

## 2013-07-11 LAB — CBC WITH DIFFERENTIAL/PLATELET
Basophils Relative: 1.4 % (ref 0.0–3.0)
Eosinophils Absolute: 0.2 10*3/uL (ref 0.0–0.7)
MCHC: 33.3 g/dL (ref 30.0–36.0)
MCV: 98.3 fl (ref 78.0–100.0)
Monocytes Absolute: 0.4 10*3/uL (ref 0.1–1.0)
Neutrophils Relative %: 63.1 % (ref 43.0–77.0)
RBC: 4.09 Mil/uL (ref 3.87–5.11)

## 2013-07-14 ENCOUNTER — Emergency Department (HOSPITAL_COMMUNITY): Payer: Medicare PPO

## 2013-07-14 ENCOUNTER — Encounter (HOSPITAL_COMMUNITY): Payer: Self-pay | Admitting: *Deleted

## 2013-07-14 ENCOUNTER — Emergency Department (HOSPITAL_COMMUNITY)
Admission: EM | Admit: 2013-07-14 | Discharge: 2013-07-14 | Disposition: A | Discharge status: Home / Self Care | Payer: Medicare PPO | Attending: Emergency Medicine | Admitting: Emergency Medicine

## 2013-07-14 DIAGNOSIS — E119 Type 2 diabetes mellitus without complications: Secondary | ICD-10-CM | POA: Insufficient documentation

## 2013-07-14 DIAGNOSIS — R200 Anesthesia of skin: Secondary | ICD-10-CM

## 2013-07-14 DIAGNOSIS — Z3202 Encounter for pregnancy test, result negative: Secondary | ICD-10-CM | POA: Insufficient documentation

## 2013-07-14 DIAGNOSIS — I10 Essential (primary) hypertension: Secondary | ICD-10-CM | POA: Insufficient documentation

## 2013-07-14 DIAGNOSIS — M549 Dorsalgia, unspecified: Secondary | ICD-10-CM | POA: Insufficient documentation

## 2013-07-14 DIAGNOSIS — G8929 Other chronic pain: Secondary | ICD-10-CM | POA: Insufficient documentation

## 2013-07-14 DIAGNOSIS — R209 Unspecified disturbances of skin sensation: Secondary | ICD-10-CM | POA: Insufficient documentation

## 2013-07-14 DIAGNOSIS — F17200 Nicotine dependence, unspecified, uncomplicated: Secondary | ICD-10-CM | POA: Insufficient documentation

## 2013-07-14 DIAGNOSIS — Z79899 Other long term (current) drug therapy: Secondary | ICD-10-CM | POA: Insufficient documentation

## 2013-07-14 DIAGNOSIS — E785 Hyperlipidemia, unspecified: Secondary | ICD-10-CM | POA: Insufficient documentation

## 2013-07-14 HISTORY — DX: Tachycardia, unspecified: R00.0

## 2013-07-14 LAB — BASIC METABOLIC PANEL
Calcium: 9.3 mg/dL (ref 8.4–10.5)
Creatinine, Ser: 0.77 mg/dL (ref 0.50–1.10)
GFR calc non Af Amer: >90 mL/min (ref >90)
Glucose, Bld: 99 mg/dL (ref 70–99)
Potassium: 4.4 mEq/L (ref 3.5–5.1)
Sodium: 140 mEq/L (ref 135–145)

## 2013-07-14 NOTE — ED Notes (Signed)
CBG 106 

## 2013-07-14 NOTE — ED Provider Notes (Signed)
CSN: [id number]     Arrival date & time 07/14/13  1621 History   First MD Initiated Contact with Patient 07/14/13 1840     Chief Complaint  Patient presents with  . Numbness   (Consider location/radiation/quality/duration/timing/severity/associated sxs/prior Treatment) HPI Comments: 42 year old female presents with right facial numbness the past 3-4 days. The numbness is intermittent and is really started 8 hours ago. The patient denies any pain. States it feels like she got a shot at the dentist. She denies any trouble smiling, speaking, or weakness. States it feels similar to the numbness she has in her legs bilaterally for over a year. Denies any headaches or neck pain. Sometimes (approximately 25% of the time) the numbness on the left side.  The history is provided by the patient.    Past Medical History  Diagnosis Date  . Diabetes mellitus   . Hyperlipidemia   . 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 Hypertension   . Tachycardia    Past Surgical History  Procedure Laterality Date  . Breast lumpectomy  2004    cyst-Left Breast   Family History  Problem Relation Age of Onset  . Breast cancer    . Colon cancer    . Cancer Mother 51    breast  . Hypertension Mother   . Hyperlipidemia Mother   . Diabetes Mother   . Cancer Father 9    colon  . Hypertension Father   . Hyperlipidemia Father   . Heart disease Father     a fib  . Diabetes Father   . Diabetes Sister    History  Substance Use Topics  . Smoking status: Current Some Day Smoker -- 1.00 packs/day for 19 years    Types: Cigarettes  . Smokeless tobacco: Never Used     Comment: couldn't tolerate chantix  . Alcohol Use: No   OB History   Grav Para Term Preterm Abortions TAB SAB Ect Mult Living                 Review of Systems  Constitutional: Negative for fever and chills.  Eyes: Negative for visual disturbance.  Neurological: Negative for dizziness,  syncope, speech difficulty, weakness, light-headedness, numbness and headaches.  All other systems reviewed and are negative.    Allergies  Review of patient's allergies indicates no known allergies.  Home Medications   Current Outpatient Rx  Name  Route  Sig  Dispense  Refill  . ACCU-CHEK FASTCLIX LANCETS MISC      CHECK BLOOD SUGAR DAILY.   102 each   11   . FREESTYLE TEST STRIPS test strip      TEST BLOOD GLUCOSE TWICE DAILY   100 each   8   . gabapentin (NEURONTIN) 300 MG capsule   Oral   Take 300 mg by mouth at bedtime.         Marland Kitchen HYDROcodone-acetaminophen (VICODIN ES) 7.5-750 MG per tablet   Oral   Take 1 tablet by mouth every 6 (six) hours as needed for pain.          . metoprolol succinate (TOPROL-XL) 50 MG 24 hr tablet   Oral   Take 50 mg by mouth daily.         Marland Kitchen omeprazole (PRILOSEC) 20 MG capsule   Oral   Take 20 mg by mouth daily as needed (acid reflux).         . Probiotic Product (PROBIOTIC PEARLS PO)  Oral   Take 1 tablet by mouth daily.            BP 135/87  Pulse 111  Temp(Src) 98.4 F (36.9 C) (Oral)  Resp 16  SpO2 100%  LMP 07/11/2013 Physical Exam  Nursing note and vitals reviewed. Constitutional: She is oriented to person, place, and time. She appears well-developed and well-nourished.  HENT:  Head: Normocephalic and atraumatic.  Right Ear: External ear normal.  Left Ear: External ear normal.  Nose: Nose normal.  Mouth/Throat: Oropharynx is clear and moist. No dental abscesses. No oropharyngeal exudate.  Eyes: EOM are normal. Pupils are equal, round, and reactive to light. Right eye exhibits no discharge. Left eye exhibits no discharge.  Cardiovascular: Normal rate, regular rhythm, normal heart sounds and intact distal pulses.   Pulmonary/Chest: Effort normal and breath sounds normal.  Abdominal: Soft. There is no tenderness.  Neurological: She is alert and oriented to person, place, and time. She has normal strength. No  cranial nerve deficit or sensory deficit. She exhibits normal muscle tone. Gait normal. GCS eye subscore is 4. GCS verbal subscore is 5. GCS motor subscore is 6.  Skin: Skin is warm and dry.    ED Course  Procedures (including critical care time) Labs Review Labs Reviewed  GLUCOSE, CAPILLARY - Abnormal; Notable for the following:    Glucose-Capillary 106 (*)    All other components within normal limits  BASIC METABOLIC PANEL  POCT PREGNANCY, URINE   Imaging Review Ct Head Wo Contrast  07/14/2013   CLINICAL DATA:  Intermittent right facial numbness for 2 days.  EXAM: CT HEAD WITHOUT CONTRAST  TECHNIQUE: Contiguous axial images were obtained from the base of the skull through the vertex without intravenous contrast.  COMPARISON:  None.  FINDINGS: There is no evidence of acute intracranial hemorrhage, mass lesion, brain edema or extra-axial fluid collection. The ventricles and subarachnoid spaces are appropriately sized for age. There is no CT evidence of acute cortical infarction.  The visualized paranasal sinuses and mastoid air cells are clear. There is minimal opacity in the right middle ear (image number 7). The left middle ear appears clear. The calvarium is intact.  IMPRESSION: No acute intracranial findings. Partial opacification of the right middle ear.   Electronically Signed   By: Roxy Horseman   On: 07/14/2013 20:23    MDM   1. Numbness    Her sx are not c/w a stroke or TIA. They are intermittent but change sides. No pain or weakness associated. Has benign exam here and feels sensation normally on exam over face bilaterally. Normal cranial nerve exam. No dental infections that could be referring cause. No electrolyte disturbances. Will recommend f/u with PCP for outpatient workup but at this time there seems to be no emergent cause to her sx. Her tachycardia from triage resolved while in room w/o intervention.    Audree Camel, MD 07/15/13 [phone]

## 2013-07-14 NOTE — ED Notes (Signed)
Phlebotomy called about BMP lab, delay explained to patient.

## 2013-07-14 NOTE — ED Notes (Signed)
Pt with intermittent R facial numbness since Saturday.  Intermittent numbness to L cheek as well.  Pain with palpation of sinus.  Dr Juleen China does not feel pt is at risk for stroke.

## 2013-07-15 ENCOUNTER — Telehealth: Payer: Self-pay | Admitting: General Practice

## 2013-07-15 NOTE — Telephone Encounter (Signed)
noted 

## 2013-07-15 NOTE — Telephone Encounter (Signed)
Pt called stating that she was seen in the ER yesterday with facial numbness. Wanted you to be aware.

## 2013-07-18 ENCOUNTER — Other Ambulatory Visit: Payer: Self-pay

## 2013-07-21 ENCOUNTER — Encounter: Payer: Self-pay | Admitting: Family Medicine

## 2013-08-06 ENCOUNTER — Other Ambulatory Visit: Payer: Self-pay

## 2013-08-06 DIAGNOSIS — Z1231 Encounter for screening mammogram for malignant neoplasm of breast: Secondary | ICD-10-CM

## 2013-08-28 ENCOUNTER — Other Ambulatory Visit: Payer: Self-pay

## 2013-09-02 ENCOUNTER — Encounter: Payer: Self-pay | Admitting: Family Medicine

## 2013-09-04 ENCOUNTER — Ambulatory Visit
Admission: RE | Admit: 2013-09-04 | Discharge: 2013-09-04 | Disposition: A | Discharge status: Home / Self Care | Payer: Medicare Other | Source: Ambulatory Visit

## 2013-09-04 DIAGNOSIS — Z1231 Encounter for screening mammogram for malignant neoplasm of breast: Secondary | ICD-10-CM

## 2013-09-12 ENCOUNTER — Other Ambulatory Visit: Payer: Self-pay | Admitting: Internal Medicine

## 2013-09-12 NOTE — Telephone Encounter (Signed)
Metoprolol refilled per protocol 

## 2013-09-23 DIAGNOSIS — Z79899 Other long term (current) drug therapy: Secondary | ICD-10-CM | POA: Diagnosis not present

## 2013-10-06 ENCOUNTER — Encounter: Payer: Self-pay | Admitting: Family Medicine

## 2013-10-06 ENCOUNTER — Ambulatory Visit (INDEPENDENT_AMBULATORY_CARE_PROVIDER_SITE_OTHER): Payer: Medicare PPO | Admitting: Family Medicine

## 2013-10-06 VITALS — BP 138/84 | HR 102 | Temp 98.6°F | Wt 168.0 lb

## 2013-10-06 DIAGNOSIS — E785 Hyperlipidemia, unspecified: Secondary | ICD-10-CM

## 2013-10-06 DIAGNOSIS — M549 Dorsalgia, unspecified: Secondary | ICD-10-CM

## 2013-10-06 DIAGNOSIS — E119 Type 2 diabetes mellitus without complications: Secondary | ICD-10-CM

## 2013-10-06 DIAGNOSIS — E1149 Type 2 diabetes mellitus with other diabetic neurological complication: Secondary | ICD-10-CM

## 2013-10-06 LAB — POCT URINALYSIS DIPSTICK
Bilirubin, UA: NEGATIVE
Blood, UA: NEGATIVE
Ketones, UA: NEGATIVE
Leukocytes, UA: NEGATIVE
Protein, UA: NEGATIVE
Spec Grav, UA: 1.02
pH, UA: 6

## 2013-10-06 NOTE — Progress Notes (Signed)
Pre visit review using our clinic review tool, if applicable. No additional management support is needed unless otherwise documented below in the visit note. 

## 2013-10-06 NOTE — Patient Instructions (Signed)
Back Pain, Adult Low back pain is very common. About 1 in 5 people have back pain.The cause of low back pain is rarely dangerous. The pain often gets better over time.About half of people with a sudden onset of back pain feel better in just 2 weeks. About 8 in 10 people feel better by 6 weeks.  CAUSES Some common causes of back pain include:  Strain of the muscles or ligaments supporting the spine.  Wear and tear (degeneration) of the spinal discs.  Arthritis.  Direct injury to the back. DIAGNOSIS Most of the time, the direct cause of low back pain is not known.However, back pain can be treated effectively even when the exact cause of the pain is unknown.Answering your caregiver's questions about your overall health and symptoms is one of the most accurate ways to make sure the cause of your pain is not dangerous. If your caregiver needs more information, he or she may order lab work or imaging tests (X-rays or MRIs).However, even if imaging tests show changes in your back, this usually does not require surgery. HOME CARE INSTRUCTIONS For many people, back pain returns.Since low back pain is rarely dangerous, it is often a condition that people can learn to manageon their own.   Remain active. It is stressful on the back to sit or stand in one place. Do not sit, drive, or stand in one place for more than 30 minutes at a time. Take short walks on level surfaces as soon as pain allows.Try to increase the length of time you walk each day.  Do not stay in bed.Resting more than 1 or 2 days can delay your recovery.  Do not avoid exercise or work.Your body is made to move.It is not dangerous to be active, even though your back may hurt.Your back will likely heal faster if you return to being active before your pain is gone.  Pay attention to your body when you bend and lift. Many people have less discomfortwhen lifting if they bend their knees, keep the load close to their bodies,and  avoid twisting. Often, the most comfortable positions are those that put less stress on your recovering back.  Find a comfortable position to sleep. Use a firm mattress and lie on your side with your knees slightly bent. If you lie on your back, put a pillow under your knees.  Only take over-the-counter or prescription medicines as directed by your caregiver. Over-the-counter medicines to reduce pain and inflammation are often the most helpful.Your caregiver may prescribe muscle relaxant drugs.These medicines help dull your pain so you can more quickly return to your normal activities and healthy exercise.  Put ice on the injured area.  Put ice in a plastic bag.  Place a towel between your skin and the bag.  Leave the ice on for 15-20 minutes, 03-04 times a day for the first 2 to 3 days. After that, ice and heat may be alternated to reduce pain and spasms.  Ask your caregiver about trying back exercises and gentle massage. This may be of some benefit.  Avoid feeling anxious or stressed.Stress increases muscle tension and can worsen back pain.It is important to recognize when you are anxious or stressed and learn ways to manage it.Exercise is a great option. SEEK MEDICAL CARE IF:  You have pain that is not relieved with rest or medicine.  You have pain that does not improve in 1 week.  You have new symptoms.  You are generally not feeling well. SEEK   IMMEDIATE MEDICAL CARE IF:   You have pain that radiates from your back into your legs.  You develop new bowel or bladder control problems.  You have unusual weakness or numbness in your arms or legs.  You develop nausea or vomiting.  You develop abdominal pain.  You feel faint. Document Released: 10/09/2005 Document Revised: 04/09/2012 Document Reviewed: 02/27/2011 ExitCare Patient Information 2014 [employer].  

## 2013-10-07 ENCOUNTER — Encounter: Payer: Self-pay | Admitting: Family Medicine

## 2013-10-07 NOTE — Progress Notes (Signed)
  Subjective:    JAEDYNN BOHLKEN is a 42 y.o. female who presents for follow up of low back problems but this felt different so so wanted her kidneys checkec.  Current symptoms include: pain in thoracic area laterally (aching and dull in character; 4/10 in severity). Symptoms have improved . Exacerbating factors identified by the patient are bending backwards and bending forwards.  The following portions of the patient's history were reviewed and updated as appropriate: allergies, current medications, past family history, past medical history, past social history, past surgical history and problem list.    Objective:    BP 138/84  Pulse 102  Temp(Src) 98.6 F (37 C) (Oral)  Wt 168 lb (76.204 kg)  SpO2 99%  LMP 09/14/2013 General appearance: alert, cooperative, appears stated age and no distress Lungs: clear to auscultation bilaterally Heart: regular rate and rhythm, S1, S2 normal, no murmur, click, rub or gallop Abdomen: soft, non-tender; bowel sounds normal; no masses,  no organomegaly    Assessment:    thoracic pain    Plan:    Educational material distributed. Ice to affected area as needed for local pain relief. Heat to affected area as needed for local pain relief. pt to f/u ortho, ua normal , check labs

## 2013-10-07 NOTE — Assessment & Plan Note (Signed)
Check labs 

## 2013-10-07 NOTE — Assessment & Plan Note (Signed)
Pt due for labs Will do today

## 2013-10-13 ENCOUNTER — Other Ambulatory Visit: Payer: Medicare Other

## 2013-11-03 ENCOUNTER — Telehealth: Payer: Self-pay | Admitting: *Deleted

## 2013-11-03 DIAGNOSIS — N939 Abnormal uterine and vaginal bleeding, unspecified: Secondary | ICD-10-CM

## 2013-11-03 NOTE — Telephone Encounter (Signed)
Are shots helping?  If yes, con't for now and we will refer to gyn for bleeding

## 2013-11-03 NOTE — Telephone Encounter (Signed)
Yes it can cause menstrual problems

## 2013-11-03 NOTE — Telephone Encounter (Signed)
Patient would like to know if you would recommend her to stop.  Please advise. SW

## 2013-11-03 NOTE — Telephone Encounter (Signed)
Patient called and stated that she has been on her menstrual cycle for now 26 days. Patient states that she receives epidural injections in lower spine. Patient would like to now if the injection could have something to do with her menstrual cycle and if she should continue them. Patient is scheduled for on this coming Wednesday. Please advise. SW

## 2013-11-04 NOTE — Telephone Encounter (Signed)
Left message on patient voicemail.

## 2013-11-04 NOTE — Telephone Encounter (Signed)
Patient called and stated that she spoke with the provider that are doing the injections and they suggested she stops. Patient did state that she still would like that referral for GYN. What diagnosis do i use for the referral? Please advise. SW

## 2013-11-04 NOTE — Telephone Encounter (Signed)
Vaginal bleeding

## 2013-11-04 NOTE — Telephone Encounter (Signed)
Referral placed.

## 2013-11-12 ENCOUNTER — Other Ambulatory Visit (INDEPENDENT_AMBULATORY_CARE_PROVIDER_SITE_OTHER): Payer: Medicare PPO

## 2013-11-12 DIAGNOSIS — E1149 Type 2 diabetes mellitus with other diabetic neurological complication: Secondary | ICD-10-CM

## 2013-11-12 DIAGNOSIS — M549 Dorsalgia, unspecified: Secondary | ICD-10-CM

## 2013-11-12 LAB — POCT URINALYSIS DIPSTICK
Bilirubin, UA: NEGATIVE
Blood, UA: NEGATIVE
GLUCOSE UA: NEGATIVE
Ketones, UA: NEGATIVE
Leukocytes, UA: NEGATIVE
NITRITE UA: NEGATIVE
PH UA: 6
Protein, UA: NEGATIVE
Spec Grav, UA: 1.025
UROBILINOGEN UA: 0.2

## 2013-11-12 LAB — BASIC METABOLIC PANEL
BUN: 8 mg/dL (ref 6–23)
CHLORIDE: 105 meq/L (ref 96–112)
CO2: 26 mEq/L (ref 19–32)
Calcium: 8.8 mg/dL (ref 8.4–10.5)
Creatinine, Ser: 0.9 mg/dL (ref 0.4–1.2)
GFR: 90.97 mL/min (ref >60.00)
Glucose, Bld: 86 mg/dL (ref 70–99)
POTASSIUM: 3.9 meq/L (ref 3.5–5.1)
SODIUM: 137 meq/L (ref 135–145)

## 2013-11-12 LAB — CBC WITH DIFFERENTIAL/PLATELET
BASOS PCT: 0.2 % (ref 0.0–3.0)
Basophils Absolute: 0 10*3/uL (ref 0.0–0.1)
EOS PCT: 0.7 % (ref 0.0–5.0)
Eosinophils Absolute: 0.1 10*3/uL (ref 0.0–0.7)
HCT: 41.6 % (ref 36.0–46.0)
Hemoglobin: 13.9 g/dL (ref 12.0–15.0)
LYMPHS ABS: 2.7 10*3/uL (ref 0.7–4.0)
Lymphocytes Relative: 30.7 % (ref 12.0–46.0)
MCHC: 33.3 g/dL (ref 30.0–36.0)
MCV: 98.2 fl (ref 78.0–100.0)
MONOS PCT: 4.8 % (ref 3.0–12.0)
Monocytes Absolute: 0.4 10*3/uL (ref 0.1–1.0)
Neutro Abs: 5.7 10*3/uL (ref 1.4–7.7)
Neutrophils Relative %: 63.6 % (ref 43.0–77.0)
Platelets: 273 10*3/uL (ref 150.0–400.0)
RBC: 4.23 Mil/uL (ref 3.87–5.11)
RDW: 14.2 % (ref 11.5–14.6)
WBC: 8.9 10*3/uL (ref 4.5–10.5)

## 2013-11-12 LAB — HEPATIC FUNCTION PANEL
ALBUMIN: 3.8 g/dL (ref 3.5–5.2)
ALT: 6 U/L (ref 0–35)
AST: 12 U/L (ref 0–37)
Alkaline Phosphatase: 49 U/L (ref 39–117)
Bilirubin, Direct: 0 mg/dL (ref 0.0–0.3)
Total Bilirubin: 0.5 mg/dL (ref 0.3–1.2)
Total Protein: 7.1 g/dL (ref 6.0–8.3)

## 2013-11-12 LAB — LIPID PANEL
Cholesterol: 216 mg/dL — ABNORMAL HIGH (ref 0–200)
HDL: 67.2 mg/dL (ref >39.00)
Total CHOL/HDL Ratio: 3
Triglycerides: 58 mg/dL (ref 0.0–149.0)
VLDL: 11.6 mg/dL (ref 0.0–40.0)

## 2013-11-12 LAB — MICROALBUMIN / CREATININE URINE RATIO
CREATININE, U: 277 mg/dL
MICROALB/CREAT RATIO: 0.1 mg/g (ref 0.0–30.0)
Microalb, Ur: 0.4 mg/dL (ref 0.0–1.9)

## 2013-11-12 LAB — LDL CHOLESTEROL, DIRECT: Direct LDL: 143.7 mg/dL

## 2013-11-12 LAB — HEMOGLOBIN A1C: HEMOGLOBIN A1C: 6.1 % (ref 4.6–6.5)

## 2013-12-11 ENCOUNTER — Encounter: Payer: Self-pay | Admitting: Family Medicine

## 2013-12-11 ENCOUNTER — Ambulatory Visit (INDEPENDENT_AMBULATORY_CARE_PROVIDER_SITE_OTHER): Payer: Medicare PPO | Admitting: Family Medicine

## 2013-12-11 VITALS — BP 124/90 | HR 92 | Temp 98.2°F | Wt 168.0 lb

## 2013-12-11 DIAGNOSIS — R202 Paresthesia of skin: Principal | ICD-10-CM

## 2013-12-11 DIAGNOSIS — R200 Anesthesia of skin: Secondary | ICD-10-CM

## 2013-12-11 DIAGNOSIS — R209 Unspecified disturbances of skin sensation: Secondary | ICD-10-CM

## 2013-12-11 NOTE — Patient Instructions (Signed)
Chronic Back Pain   When back pain lasts longer than 3 months, it is called chronic back pain.People with chronic back pain often go through certain periods that are more intense (flare-ups).   CAUSES  Chronic back pain can be caused by wear and tear (degeneration) on different structures in your back. These structures include:   The bones of your spine (vertebrae) and the joints surrounding your spinal cord and nerve roots (facets).   The strong, fibrous tissues that connect your vertebrae (ligaments).  Degeneration of these structures may result in pressure on your nerves. This can lead to constant pain.  HOME CARE INSTRUCTIONS   Avoid bending, heavy lifting, prolonged sitting, and activities which make the problem worse.   Take brief periods of rest throughout the day to reduce your pain. Lying down or standing usually is better than sitting while you are resting.   Take over-the-counter or prescription medicines only as directed by your caregiver.  SEEK IMMEDIATE MEDICAL CARE IF:    You have weakness or numbness in one of your legs or feet.   You have trouble controlling your bladder or bowels.   You have nausea, vomiting, abdominal pain, shortness of breath, or fainting.  Document Released: 11/16/2004 Document Revised: 01/01/2012 Document Reviewed: 09/23/2011  ExitCare Patient Information 2014 [employer].

## 2013-12-11 NOTE — Progress Notes (Signed)
  Subjective:    Emily Maddox is a 43 y.o. female who presents for follow up of low back problems. Current symptoms include: pain in hips , thighs and across low back and thoracic spine (sharp, throbbing and tingling in character; 4/10 in severity) and paresthesias in legs and arms. Symptoms have come and go from the previous visit. Exacerbating factors identified by the patient are sitting, standing and walking. Pt also c/o electrically "buzzing" sensation in arms and legs.  Pt has been to NS and pain management.  Pt has an appointment with Dr Ernst Bowler at [facility] but would like to see someone sooner    The following portions of the patient's history were reviewed and updated as appropriate: allergies, current medications, past family history, past medical history, past social history, past surgical history and problem list.    Objective:    BP 124/90  Pulse 92  Temp(Src) 98.2 F (36.8 C) (Oral)  Wt 168 lb (76.204 kg)  SpO2 99% General appearance: alert, cooperative, appears stated age and no distress Extremities: extremities normal, atraumatic, no cyanosis or edema Skin: Skin color, texture, turgor normal. No rashes or lesions Neurologic: Motor: + slr b/l 45 degrees ,  strength = b/l legs                   Pain in both shoulders but good strength b/l.     Assessment:    Nonspecific acute low back pain   thoracic spine pain with numbness in arms Plan:    Natural history and expected course discussed. Questions answered. refer to neurology at pt request  Pt is also requesting new pain management

## 2013-12-11 NOTE — Progress Notes (Signed)
Pre visit review using our clinic review tool, if applicable. No additional management support is needed unless otherwise documented below in the visit note. 

## 2013-12-12 ENCOUNTER — Telehealth: Payer: Self-pay | Admitting: Family Medicine

## 2013-12-12 NOTE — Telephone Encounter (Signed)
Relevant patient education assigned to patient using Emmi. ° °

## 2014-01-07 ENCOUNTER — Other Ambulatory Visit: Payer: Self-pay | Admitting: Family Medicine

## 2014-01-12 ENCOUNTER — Encounter: Payer: Self-pay | Admitting: Family Medicine

## 2014-01-12 ENCOUNTER — Ambulatory Visit (INDEPENDENT_AMBULATORY_CARE_PROVIDER_SITE_OTHER): Payer: Medicare PPO | Admitting: Family Medicine

## 2014-01-12 VITALS — BP 130/84 | HR 108 | Temp 97.6°F | Resp 16 | Wt 160.4 lb

## 2014-01-12 DIAGNOSIS — J019 Acute sinusitis, unspecified: Secondary | ICD-10-CM

## 2014-01-12 DIAGNOSIS — R000 Tachycardia, unspecified: Secondary | ICD-10-CM

## 2014-01-12 MED ORDER — DILTIAZEM HCL ER COATED BEADS 120 MG PO CP24: 120.0000 mg | ORAL_CAPSULE | Freq: Every day | ORAL | Status: DC

## 2014-01-12 MED ORDER — AMOXICILLIN 875 MG PO TABS: 875.0000 mg | ORAL_TABLET | Freq: Two times a day (BID) | ORAL | Status: DC

## 2014-01-12 MED ORDER — PROMETHAZINE-DM 6.25-15 MG/5ML PO SYRP: 5.0000 mL | ORAL_SOLUTION | Freq: Four times a day (QID) | ORAL | Status: DC | PRN

## 2014-01-12 NOTE — Patient Instructions (Signed)
Follow up as needed Start the Amoxicillin twice daily- take w/ food- for the sinus infection/bronchitis Drink plenty of fluids Use the cough syrup as needed- will cause drowsiness Mucinex DM for daytime cough Claritin or Zyrtec daily for the allergy component REST! Stop smoking! Call with any questions or concerns Hang in there!

## 2014-01-12 NOTE — Progress Notes (Signed)
Pre visit review using our clinic review tool, if applicable. No additional management support is needed unless otherwise documented below in the visit note. 

## 2014-01-12 NOTE — Progress Notes (Signed)
   Subjective:    Patient ID: Emily Maddox, female    DOB: 1971/06/05, 43 y.o.   MRN: [id number]  Sinusitis Associated symptoms include coughing.  Cough   URI- sxs started w/ R ear pain 3 weeks ago.  Started Mucinex 600mg  daily.  Now w/ chest congestion.  + maxillary sinus pain.  No fevers.  Hx of seasonal allergies.  + sick contacts.  Hx of DM, tobacco use.  Tachycardia- pt reports that she can take 25mg  of Toprol w/o difficulty but when she attempts to take the full 50mg - even if it is split into 2 doses- she develops SOB.  No documented hx of asthma or COPD but pt is a tobacco user.   Review of Systems  Respiratory: Positive for cough.    For ROS see HPI     Objective:   Physical Exam  Vitals reviewed. Constitutional: She appears well-developed and well-nourished. No distress.  HENT:  Head: Normocephalic and atraumatic.  Right Ear: Tympanic membrane normal.  Left Ear: Tympanic membrane normal.  Nose: Mucosal edema and rhinorrhea present. Right sinus exhibits maxillary sinus tenderness. Right sinus exhibits no frontal sinus tenderness. Left sinus exhibits maxillary sinus tenderness. Left sinus exhibits no frontal sinus tenderness.  Mouth/Throat: Uvula is midline and mucous membranes are normal. Posterior oropharyngeal erythema present. No oropharyngeal exudate.  Eyes: Conjunctivae and EOM are normal. Pupils are equal, round, and reactive to light.  Neck: Normal range of motion. Neck supple.  Cardiovascular: Normal rate, regular rhythm and normal heart sounds.   Pulmonary/Chest: Effort normal and breath sounds normal. No respiratory distress. She has no wheezes.  Lymphadenopathy:    She has no cervical adenopathy.          Assessment & Plan:

## 2014-01-13 ENCOUNTER — Telehealth: Payer: Self-pay | Admitting: Family Medicine

## 2014-01-13 NOTE — Assessment & Plan Note (Signed)
New to provider, ongoing for pt.  She reports she is having SOB w/ beta blocker use.  No hx of underlying asthma or COPD but have suspicions due to her ongoing tobacco use.  Will stop metoprolol and start Diltiazem daily for HR control w/o risk of pulmonary side effects.  Pt to f/u w/ PCP.

## 2014-01-13 NOTE — Assessment & Plan Note (Signed)
Pt's sxs and PE consistent w/ infxn.  Start abx.  Cough meds prn.  Reviewed supportive care and red flags that should prompt return.  Pt expressed understanding and is in agreement w/ plan.  

## 2014-01-13 NOTE — Telephone Encounter (Signed)
Relevant patient education assigned to patient using Emmi. ° °

## 2014-01-19 ENCOUNTER — Ambulatory Visit (INDEPENDENT_AMBULATORY_CARE_PROVIDER_SITE_OTHER): Payer: Medicare PPO | Admitting: Neurology

## 2014-01-19 ENCOUNTER — Encounter: Payer: Self-pay | Admitting: Neurology

## 2014-01-19 VITALS — BP 120/78 | HR 84 | Ht 66.93 in | Wt 160.5 lb

## 2014-01-19 DIAGNOSIS — R202 Paresthesia of skin: Secondary | ICD-10-CM

## 2014-01-19 DIAGNOSIS — R51 Headache: Secondary | ICD-10-CM

## 2014-01-19 DIAGNOSIS — R209 Unspecified disturbances of skin sensation: Secondary | ICD-10-CM

## 2014-01-19 DIAGNOSIS — R519 Headache, unspecified: Secondary | ICD-10-CM

## 2014-01-19 LAB — VITAMIN B12: Vitamin B-12: 332 pg/mL (ref 211–911)
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
## 2014-01-19 NOTE — Patient Instructions (Signed)
1.  MRI brain without contrast 2.  Check blood work 3.  Return to clinic in 6-weeks

## 2014-01-20 LAB — ANA: ANA: NEGATIVE

## 2014-01-21 LAB — METHYLMALONIC ACID, SERUM: Methylmalonic Acid, Quant: 0.16 umol/L (ref <0.40)

## 2014-01-22 LAB — COPPER, SERUM: Copper: 143 ug/dL (ref 70–175)

## 2014-01-23 ENCOUNTER — Other Ambulatory Visit: Payer: Self-pay | Admitting: Physical Medicine and Rehabilitation

## 2014-01-23 DIAGNOSIS — M549 Dorsalgia, unspecified: Secondary | ICD-10-CM

## 2014-02-06 ENCOUNTER — Ambulatory Visit (HOSPITAL_COMMUNITY): Payer: Medicare PPO

## 2014-02-10 ENCOUNTER — Other Ambulatory Visit: Payer: Self-pay

## 2014-02-10 ENCOUNTER — Inpatient Hospital Stay: Admission: RE | Admit: 2014-02-10 | Payer: Self-pay | Source: Ambulatory Visit

## 2014-02-16 ENCOUNTER — Ambulatory Visit
Admission: RE | Admit: 2014-02-16 | Discharge: 2014-02-16 | Disposition: A | Discharge status: Home / Self Care | Payer: Medicare Other | Source: Ambulatory Visit | Attending: Physical Medicine and Rehabilitation | Admitting: Physical Medicine and Rehabilitation

## 2014-02-16 ENCOUNTER — Other Ambulatory Visit: Payer: Self-pay | Admitting: Physical Medicine and Rehabilitation

## 2014-02-16 VITALS — BP 131/77 | HR 82 | Temp 97.8°F | Resp 16

## 2014-02-16 DIAGNOSIS — M545 Low back pain, unspecified: Secondary | ICD-10-CM | POA: Diagnosis not present

## 2014-02-16 DIAGNOSIS — M549 Dorsalgia, unspecified: Secondary | ICD-10-CM

## 2014-02-16 DIAGNOSIS — M5136 Other intervertebral disc degeneration, lumbar region: Secondary | ICD-10-CM

## 2014-02-16 DIAGNOSIS — M5126 Other intervertebral disc displacement, lumbar region: Secondary | ICD-10-CM

## 2014-02-16 MED ORDER — FENTANYL CITRATE 0.05 MG/ML IJ SOLN
25.0000 ug | INTRAMUSCULAR | Status: DC | PRN
  Administered 2014-02-16: 100 ug via INTRAVENOUS

## 2014-02-16 MED ORDER — SODIUM CHLORIDE 0.9 % IV SOLN
Freq: Once | INTRAVENOUS | Status: AC
  Administered 2014-02-16: 08:00:00 via INTRAVENOUS

## 2014-02-16 MED ORDER — KETOROLAC TROMETHAMINE 30 MG/ML IJ SOLN
30.0000 mg | Freq: Once | INTRAMUSCULAR | Status: AC
  Administered 2014-02-16: 30 mg via INTRAVENOUS

## 2014-02-16 MED ORDER — MIDAZOLAM HCL 2 MG/2ML IJ SOLN
1.0000 mg | INTRAMUSCULAR | Status: DC | PRN
  Administered 2014-02-16: 1 mg via INTRAVENOUS

## 2014-02-16 MED ORDER — CEFAZOLIN SODIUM-DEXTROSE 2-3 GM-% IV SOLR
2.0000 g | Freq: Once | INTRAVENOUS | Status: AC
  Administered 2014-02-16: 2 g via INTRAVENOUS

## 2014-02-16 MED ORDER — IOHEXOL 180 MG/ML  SOLN: 6.5000 mL | Freq: Once | INTRAMUSCULAR | Status: AC | PRN

## 2014-02-16 NOTE — Progress Notes (Signed)
Dr. Jola Baptist in to speak with patient and her father after discogram.  jkl

## 2014-02-16 NOTE — Discharge Instructions (Signed)
Discogram Post Procedure Discharge Instructions ° °1. May resume a regular diet and any medications that you routinely take (including pain medications). °2. No driving day of procedure. °3. Upon discharge go home and rest for at least 4 hours.  May use an ice pack as needed to injection sites on back.  Ice to back 30 minutes on and 30 minutes off, all day. °4. May remove bandades later, today. °5. It is not unusual to be sore for several days after this procedure. ° ° ° °Please contact our office at [phone] for the following symptoms: ° °· Fever greater than 100 degrees °· Increased swelling, pain, or redness at injection site. ° ° °Thank you for visiting [facility]. ° ° °

## 2014-02-16 NOTE — Progress Notes (Signed)
Discharge instructions explained to pt. 

## 2014-03-03 ENCOUNTER — Ambulatory Visit: Payer: Self-pay | Admitting: Neurology

## 2014-03-13 ENCOUNTER — Telehealth: Payer: Self-pay

## 2014-03-13 DIAGNOSIS — M549 Dorsalgia, unspecified: Secondary | ICD-10-CM

## 2014-03-13 NOTE — Telephone Encounter (Signed)
Spoke with patient and she stated she went back to her Doctor for her back and a discogram was done and she had a tear in 2 discs in her back. She wanted to get a referral to a neuosurgeon. I made her aware she would need to have all of her records sent over here, she voiced understanding and said she would. Ok to put in referral per [facility]   KP

## 2014-03-19 ENCOUNTER — Ambulatory Visit (INDEPENDENT_AMBULATORY_CARE_PROVIDER_SITE_OTHER): Payer: Medicare Other | Admitting: Family Medicine

## 2014-03-19 ENCOUNTER — Encounter: Payer: Self-pay | Admitting: Family Medicine

## 2014-03-19 ENCOUNTER — Telehealth: Payer: Self-pay | Admitting: *Deleted

## 2014-03-19 VITALS — BP 116/76 | HR 97 | Temp 98.1°F | Wt 163.2 lb

## 2014-03-19 DIAGNOSIS — M549 Dorsalgia, unspecified: Secondary | ICD-10-CM | POA: Diagnosis not present

## 2014-03-19 DIAGNOSIS — E1149 Type 2 diabetes mellitus with other diabetic neurological complication: Secondary | ICD-10-CM

## 2014-03-19 DIAGNOSIS — K219 Gastro-esophageal reflux disease without esophagitis: Secondary | ICD-10-CM

## 2014-03-19 DIAGNOSIS — G4700 Insomnia, unspecified: Secondary | ICD-10-CM | POA: Diagnosis not present

## 2014-03-19 LAB — LIPID PANEL
Cholesterol: 217 mg/dL — ABNORMAL HIGH (ref 0–200)
HDL: 71.6 mg/dL (ref >39.00)
LDL CALC: 132 mg/dL — AB (ref 0–99)
Total CHOL/HDL Ratio: 3
Triglycerides: 67 mg/dL (ref 0.0–149.0)
VLDL: 13.4 mg/dL (ref 0.0–40.0)

## 2014-03-19 LAB — POCT URINALYSIS DIPSTICK
Bilirubin, UA: NEGATIVE
Glucose, UA: NEGATIVE
Ketones, UA: NEGATIVE
Leukocytes, UA: NEGATIVE
Nitrite, UA: NEGATIVE
Protein, UA: NEGATIVE
RBC UA: NEGATIVE
Spec Grav, UA: 1.015
UROBILINOGEN UA: 0.2
pH, UA: 6

## 2014-03-19 LAB — HEPATIC FUNCTION PANEL
ALT: 11 U/L (ref 0–35)
AST: 14 U/L (ref 0–37)
Albumin: 3.9 g/dL (ref 3.5–5.2)
Alkaline Phosphatase: 43 U/L (ref 39–117)
BILIRUBIN DIRECT: 0 mg/dL (ref 0.0–0.3)
BILIRUBIN TOTAL: 0.5 mg/dL (ref 0.2–1.2)
Total Protein: 7 g/dL (ref 6.0–8.3)

## 2014-03-19 LAB — TSH: TSH: 1.62 u[IU]/mL (ref 0.35–4.50)

## 2014-03-19 LAB — BASIC METABOLIC PANEL
BUN: 9 mg/dL (ref 6–23)
CHLORIDE: 104 meq/L (ref 96–112)
CO2: 27 mEq/L (ref 19–32)
Calcium: 9 mg/dL (ref 8.4–10.5)
Creatinine, Ser: 0.8 mg/dL (ref 0.4–1.2)
GFR: 105.94 mL/min (ref >60.00)
Glucose, Bld: 90 mg/dL (ref 70–99)
POTASSIUM: 4 meq/L (ref 3.5–5.1)
SODIUM: 138 meq/L (ref 135–145)

## 2014-03-19 LAB — HEMOGLOBIN A1C: Hgb A1c MFr Bld: 5.9 % (ref 4.6–6.5)

## 2014-03-19 MED ORDER — SUVOREXANT 10 MG PO TABS: 1.0000 | ORAL_TABLET | Freq: Every evening | ORAL | Status: DC | PRN

## 2014-03-19 MED ORDER — ZOLPIDEM TARTRATE 5 MG PO TABS: 5.0000 mg | ORAL_TABLET | ORAL | Status: DC | PRN

## 2014-03-19 MED ORDER — SUVOREXANT 10 MG PO TABS: 1.0000 | ORAL_TABLET | Freq: Every day | ORAL | Status: DC

## 2014-03-19 MED ORDER — OMEPRAZOLE 20 MG PO CPDR: 20.0000 mg | DELAYED_RELEASE_CAPSULE | Freq: Every day | ORAL | Status: DC | PRN

## 2014-03-19 NOTE — Telephone Encounter (Signed)
Caller name:  Milinda Relation to pt:  self Call back number:  [phone] Pharmacy:  Reason for call:   Pt came back by office.  The sleep medication prescribed her today is not covered by her insurance.  She ask if she could possibly get something else.  Please advise.  bw

## 2014-03-19 NOTE — Progress Notes (Signed)
Pre visit review using our clinic review tool, if applicable. No additional management support is needed unless otherwise documented below in the visit note. 

## 2014-03-19 NOTE — Patient Instructions (Signed)

## 2014-03-19 NOTE — Telephone Encounter (Signed)
Rx faxed and the patient has been made aware.     KP 

## 2014-03-19 NOTE — Telephone Encounter (Signed)
ambien 5 mg #30 1 po qhs prn but I don't want her taking everyday-- try no more then 3 x a week

## 2014-03-19 NOTE — Telephone Encounter (Signed)
Please advise      KP 

## 2014-03-19 NOTE — Progress Notes (Signed)
   Subjective:    Patient ID: Emily Maddox, female    DOB: 10-19-1971, 43 y.o.   MRN: [id number]  HPI  HPI HYPERTENSION  Blood pressure range-good  Chest pain- no      Dyspnea- no Lightheadedness- no   Edema- no Other side effects - no   Medication compliance: good Low salt diet- yes  DIABETES  Blood Sugar ranges-good per pt   Polyuria- no New Visual problems- no Hypoglycemic symptoms- no Other side effects-no Medication compliance - diet controlled Last eye exam- annual Foot exam- today  HYPERLIPIDEMIA  Medication compliance- good RUQ pain- no  Muscle aches- no Other side effects-no         Review of Systems As above    Objective:   Physical Exam BP 116/76  Pulse 97  Temp(Src) 98.1 F (36.7 C) (Oral)  Wt 163 lb 3.2 oz (74.027 kg)  SpO2 99% General appearance: alert, cooperative, appears stated age and no distress Throat: lips, mucosa, and tongue normal; teeth and gums normal Neck: no adenopathy, supple, symmetrical, trachea midline and thyroid not enlarged, symmetric, no tenderness/mass/nodules Lungs: clear to auscultation bilaterally Heart: regular rate and rhythm, S1, S2 normal, no murmur, click, rub or gallop Abdomen: soft, non-tender; bowel sounds normal; no masses,  no organomegaly Extremities: extremities normal, atraumatic, no cyanosis or edema Sensory exam of the foot is normal, tested with the monofilament. Good pulses, no lesions or ulcers, good peripheral pulses.        Assessment & Plan:  1. Insomnia Try new med-- can double prn rto prn - Suvorexant (BELSOMRA) 10 MG TABS; Take 1 tablet by mouth at bedtime as needed.  Dispense: 30 tablet; Refill: 5 - Suvorexant (BELSOMRA) 10 MG TABS; Take 1 tablet by mouth at bedtime.  Dispense: 10 tablet; Refill: 0  2. Type II or unspecified type diabetes mellitus with neurological manifestations, not stated as uncontrolled Check labs, con't meds - Basic metabolic panel - Hepatic function  panel - Lipid panel - POCT urinalysis dipstick - TSH - Hemoglobin A1c  3. Back pain Per pain med  4. GERD (gastroesophageal reflux disease) Refill meds - omeprazole (PRILOSEC) 20 MG capsule; Take 1 capsule (20 mg total) by mouth daily as needed (acid reflux).  Dispense: 90 capsule; Refill: 3

## 2014-03-23 ENCOUNTER — Other Ambulatory Visit: Payer: Self-pay | Admitting: Family Medicine

## 2014-04-06 ENCOUNTER — Telehealth: Payer: Self-pay | Admitting: Family Medicine

## 2014-04-06 NOTE — Telephone Encounter (Signed)
Spoke with patient and she said to disregard the call.     KP

## 2014-04-06 NOTE — Telephone Encounter (Signed)
MSG left to call the office      KP 

## 2014-04-06 NOTE — Telephone Encounter (Signed)
Caller name: Lennie  Call back number:[phone]   Reason for call:  Pt is calling in regarding Dr. Jone Baseman.  Pt said message was confidential and did not want to leave anything else and wants to speak with Dr. Etter Sjogren.

## 2014-04-16 ENCOUNTER — Encounter: Payer: Self-pay | Admitting: Family Medicine

## 2014-04-16 ENCOUNTER — Ambulatory Visit (INDEPENDENT_AMBULATORY_CARE_PROVIDER_SITE_OTHER): Payer: Medicare Other | Admitting: Family Medicine

## 2014-04-16 VITALS — BP 120/80 | HR 95 | Temp 98.1°F | Wt 165.0 lb

## 2014-04-16 DIAGNOSIS — R000 Tachycardia, unspecified: Secondary | ICD-10-CM | POA: Diagnosis not present

## 2014-04-16 DIAGNOSIS — E785 Hyperlipidemia, unspecified: Secondary | ICD-10-CM | POA: Diagnosis not present

## 2014-04-16 DIAGNOSIS — I10 Essential (primary) hypertension: Secondary | ICD-10-CM | POA: Diagnosis not present

## 2014-04-16 NOTE — Progress Notes (Signed)
   Subjective:    Patient ID: Emily Maddox, female    DOB: 1970/12/16, 43 y.o.   MRN: [id number]  HPI Pt here to discuss pain management -- she got an appointment with Dr Carloyn Manner in [place] but it is not until Sept 21, 2015.  She will run out of meds in August and wants to know if we can write the rx until her appointment.  No new problems.  Pt has pain in both legs R>L.      Review of Systems As above    Objective:   Physical Exam  BP 120/80  Pulse 95  Temp(Src) 98.1 F (36.7 C) (Oral)  Wt 165 lb (74.844 kg)  SpO2 98% General appearance: alert, cooperative, appears stated age and no distress Throat: lips, mucosa, and tongue normal; teeth and gums normal Lungs: clear to auscultation bilaterally Heart: S1, S2 normal Extremities: no edema      Assessment & Plan:  1. Tachycardia con't meds stable  2. Hyperlipidemia con't meds  3. Essential hypertension Stable con't meds  4. Chronic pain-- we will refill meds in August when she needs it

## 2014-04-16 NOTE — Progress Notes (Signed)
Pre visit review using our clinic review tool, if applicable. No additional management support is needed unless otherwise documented below in the visit note. 

## 2014-04-16 NOTE — Patient Instructions (Signed)
Pain Medicine Instructions You have been given a prescription for pain medicines. These medicines may affect your ability to think clearly. They may also affect your ability to perform physical activities. Take these medicines only as needed for pain. You do not need to take them if you are not having pain, unless directed by your caregiver. You can take less than the prescribed dose if you find a smaller amount of medicine controls the pain. It may not be possible to make all of your pain go away, but you should be comfortable enough to move, breathe, and take care of yourself. After you start taking pain medicines, while taking the medicines, and for 8 hours after stopping the medicines:  Do not drive.  Do not operate machinery.  Do not operate power tools.  Do not sign legal documents.  Do not supervise children by yourself.  Do not participate in activities that require climbing or being in high places.  Do not enter a body of water (lake, river, ocean, spa, swimming pool) without an adult nearby who can help you. You may have been prescribed a pain medicine that contains acetaminophen (paracetamol). If so, take only the amount directed by your caregiver. Do not take any other acetaminophen while taking this medicine. An overdose of acetaminophen can result in severe liver damage. If you are taking other medicines, check the active ingredients for acetaminophen. Acetaminophen is found in hundreds of over-the-counter and prescription medicines. These include cold relief products, menstrual cramp relief medicines, fever-reducing medicines, acid indigestion relief products, and pain relief products. HOME CARE INSTRUCTIONS   Do not drink alcohol, take sleeping pills, or take other medicines until at least 8 hours after your last dose of pain medicine, or as directed by your caregiver.  Use a bulk stool softener if you become constipated from your pain medicines. Increasing your intake of fruits  and vegetables will also help.  Write down the times when you take your medicines. Look at the times before taking your next dose of medicine. It is easy to become confused while on pain medicines. Recording the times helps you to avoid an overdose. SEEK MEDICAL CARE IF:  Your medicine is not helping the pain go away.  You vomit or have diarrhea shortly after taking the medicine.  You develop new pain in areas that did not hurt before. SEEK IMMEDIATE MEDICAL CARE IF:  You feel dizzy or faint.  You feel there are other problems that might be caused by your medicine. MAKE SURE YOU:   Understand these instructions.  Will watch your condition.  Will get help right away if you are not doing well or get worse. Document Released: 01/15/2001 Document Revised: 02/03/2013 Document Reviewed: 09/23/2010 [facility] Patient Information 2015 [place], [place]. This information is not intended to replace advice given to you by your health care provider. Make sure you discuss any questions you have with your health care provider.

## 2014-05-07 ENCOUNTER — Telehealth: Payer: Self-pay | Admitting: *Deleted

## 2014-05-07 MED ORDER — ACCU-CHEK FASTCLIX LANCETS MISC: Status: DC

## 2014-05-07 NOTE — Telephone Encounter (Signed)
Refilled Accu-Check FastClix Lancets sent to pharmacy by e-script.//AB/CMA  Received completed and signed Medicare Part B Detailed Written Order from Dr. Etter Sjogren.  Form faxed to [facility] at [phone]).  Confirmation received.//AB/CMA

## 2014-05-18 ENCOUNTER — Telehealth: Payer: Self-pay | Admitting: Family Medicine

## 2014-05-18 NOTE — Telephone Encounter (Signed)
Pt needs re-fill on ACCU-CHEK AVIVA PLUS test strip [id number] - [place], [place]

## 2014-05-19 MED ORDER — GLUCOSE BLOOD VI STRP: ORAL_STRIP | Status: DC

## 2014-05-19 NOTE — Telephone Encounter (Signed)
Rx has been sent.       KP 

## 2014-05-28 ENCOUNTER — Ambulatory Visit (INDEPENDENT_AMBULATORY_CARE_PROVIDER_SITE_OTHER): Payer: Medicare Other | Admitting: Family Medicine

## 2014-05-28 ENCOUNTER — Encounter: Payer: Self-pay | Admitting: Family Medicine

## 2014-05-28 VITALS — BP 114/70 | HR 105 | Temp 98.1°F | Wt 164.0 lb

## 2014-05-28 DIAGNOSIS — G4700 Insomnia, unspecified: Secondary | ICD-10-CM | POA: Diagnosis not present

## 2014-05-28 DIAGNOSIS — M549 Dorsalgia, unspecified: Secondary | ICD-10-CM

## 2014-05-28 DIAGNOSIS — M545 Low back pain, unspecified: Secondary | ICD-10-CM | POA: Diagnosis not present

## 2014-05-28 DIAGNOSIS — G8929 Other chronic pain: Secondary | ICD-10-CM

## 2014-05-28 DIAGNOSIS — E1159 Type 2 diabetes mellitus with other circulatory complications: Secondary | ICD-10-CM

## 2014-05-28 LAB — MICROALBUMIN / CREATININE URINE RATIO
Creatinine,U: 149.6 mg/dL
MICROALB UR: 0.3 mg/dL (ref 0.0–1.9)
Microalb Creat Ratio: 0.2 mg/g (ref 0.0–30.0)

## 2014-05-28 MED ORDER — SUVOREXANT 10 MG PO TABS: 1.0000 | ORAL_TABLET | Freq: Every evening | ORAL | Status: DC | PRN

## 2014-05-28 MED ORDER — HYDROCODONE-ACETAMINOPHEN 7.5-750 MG PO TABS: 1.0000 | ORAL_TABLET | Freq: Four times a day (QID) | ORAL | Status: DC | PRN

## 2014-05-28 NOTE — Progress Notes (Signed)
Pre visit review using our clinic review tool, if applicable. No additional management support is needed unless otherwise documented below in the visit note. 

## 2014-05-28 NOTE — Patient Instructions (Signed)
Diabetes and Standards of Medical Care Diabetes is complicated. You may find that your diabetes team includes a dietitian, nurse, diabetes educator, eye doctor, and more. To help everyone know what is going on and to help you get the care you deserve, the following schedule of care was developed to help keep you on track. Below are the tests, exams, vaccines, medicines, education, and plans you will need. HbA1c test This test shows how well you have controlled your glucose over the past 2-3 months. It is used to see if your diabetes management plan needs to be adjusted.   It is performed at least 2 times a year if you are meeting treatment goals.  It is performed 4 times a year if therapy has changed or if you are not meeting treatment goals. Blood pressure test  This test is performed at every routine medical visit. The goal is less than 140/90 mm Hg for most people, but 130/80 mm Hg in some cases. Ask your health care provider about your goal. Dental exam  Follow up with the dentist regularly. Eye exam  If you are diagnosed with type 1 diabetes as a child, get an exam upon reaching the age of 37 years or older and have had diabetes for 3-5 years. Yearly eye exams are recommended after that initial eye exam.  If you are diagnosed with type 1 diabetes as an adult, get an exam within 5 years of diagnosis and then yearly.  If you are diagnosed with type 2 diabetes, get an exam as soon as possible after the diagnosis and then yearly. Foot care exam  Visual foot exams are performed at every routine medical visit. The exams check for cuts, injuries, or other problems with the feet.  A comprehensive foot exam should be done yearly. This includes visual inspection as well as assessing foot pulses and testing for loss of sensation.  Check your feet nightly for cuts, injuries, or other problems with your feet. Tell your health care provider if anything is not healing. Kidney function test (urine  microalbumin)  This test is performed once a year.  Type 1 diabetes: The first test is performed 5 years after diagnosis.  Type 2 diabetes: The first test is performed at the time of diagnosis.  A serum creatinine and estimated glomerular filtration rate (eGFR) test is done once a year to assess the level of chronic kidney disease (CKD), if present. Lipid profile (cholesterol, HDL, LDL, triglycerides)  Performed every 5 years for most people.  The goal for LDL is less than 100 mg/dL. If you are at high risk, the goal is less than 70 mg/dL.  The goal for HDL is 40 mg/dL-50 mg/dL for men and 50 mg/dL-60 mg/dL for women. An HDL cholesterol of 60 mg/dL or higher gives some protection against heart disease.  The goal for triglycerides is less than 150 mg/dL. Influenza vaccine, pneumococcal vaccine, and hepatitis B vaccine  The influenza vaccine is recommended yearly.  It is recommended that people with diabetes who are over 24 years old get the pneumonia vaccine. In some cases, two separate shots may be given. Ask your health care provider if your pneumonia vaccination is up to date.  The hepatitis B vaccine is also recommended for adults with diabetes. Diabetes self-management education  Education is recommended at diagnosis and ongoing as needed. Treatment plan  Your treatment plan is reviewed at every medical visit. Document Released: 08/06/2009 Document Revised: 02/23/2014 Document Reviewed: 03/11/2013 [facility] Patient Information 2015 [place],  [employer]. This information is not intended to replace advice given to you by your health care provider. Make sure you discuss any questions you have with your health care provider.

## 2014-05-28 NOTE — Progress Notes (Signed)
   Subjective:    Patient ID: Emily Maddox, female    DOB: 03-15-1971, 43 y.o.   MRN: [id number]  HPI Pt here f/u diabetes and chronic back pain.  Pt is waiting for pain management.  No other complaints.     Review of Systems As above    Objective:   Physical Exam BP 114/70  Pulse 105  Temp(Src) 98.1 F (36.7 C) (Oral)  Wt 164 lb (74.39 kg)  SpO2 98% General appearance: alert, cooperative, appears stated age and no distress Extremities: extremities normal, atraumatic, no cyanosis or edema Sensory exam of the foot is normal, tested with the monofilament. Good pulses, no lesions or ulcers, good peripheral pulses.         Assessment & Plan:  1. Type II or unspecified type diabetes mellitus with peripheral circulatory disorders, uncontrolled(250.72) Lab Results  Component Value Date   HGBA1C 5.9 03/19/2014  con't meds  - Microalbumin / creatinine urine ratio  2. Left-sided low back pain without sciatica Pt waiting on NS/ pain management referral  3. Chronic back pain Refill meds - HYDROcodone-acetaminophen (VICODIN ES) 7.5-750 MG per tablet; Take 1 tablet by mouth every 6 (six) hours as needed for pain.  Dispense: 60 tablet; Refill: 0  4. Insomnia belsorma 10 mg 1 po qhs prn

## 2014-05-29 ENCOUNTER — Telehealth: Payer: Self-pay | Admitting: Family Medicine

## 2014-05-29 NOTE — Telephone Encounter (Signed)
Please advise      KP 

## 2014-05-29 NOTE — Telephone Encounter (Signed)
Caller name: Janifer Relation to pt: Call back [place]:  Reason for call:  In yesterday, got Rx for hydrocodone, but the pharmacist states that the 750 mg is invalid and is needing it lowered down

## 2014-06-02 MED ORDER — HYDROCODONE-ACETAMINOPHEN 5-325 MG PO TABS: 1.0000 | ORAL_TABLET | Freq: Four times a day (QID) | ORAL | Status: DC | PRN

## 2014-06-02 MED ORDER — HYDROCODONE-ACETAMINOPHEN 7.5-325 MG PO TABS: 1.0000 | ORAL_TABLET | Freq: Four times a day (QID) | ORAL | Status: DC | PRN

## 2014-06-02 NOTE — Telephone Encounter (Signed)
Change to vicodin 5/325 ---- rest rx same

## 2014-06-02 NOTE — Telephone Encounter (Signed)
Rx printed and given to the patient       KP

## 2014-06-02 NOTE — Addendum Note (Signed)
Addended by: Ewing Schlein on: 06/02/2014 02:39 PM   Modules accepted: Orders

## 2014-06-02 NOTE — Telephone Encounter (Signed)
Patient said the wrong Rx was given so she bought it back to the office, she needed the Hydrocodone 7.5-325 mg. Per Dr.Lowne this is ok and the Rx has been printed the 5-325 has been shredded.     KP

## 2014-07-13 DIAGNOSIS — S335XXA Sprain of ligaments of lumbar spine, initial encounter: Secondary | ICD-10-CM | POA: Diagnosis not present

## 2014-07-16 ENCOUNTER — Ambulatory Visit (INDEPENDENT_AMBULATORY_CARE_PROVIDER_SITE_OTHER): Payer: Medicare Other | Admitting: Family Medicine

## 2014-07-16 ENCOUNTER — Ambulatory Visit: Payer: Medicare Other | Attending: Neurosurgery | Admitting: Physical Therapy

## 2014-07-16 ENCOUNTER — Encounter: Payer: Self-pay | Admitting: Family Medicine

## 2014-07-16 VITALS — BP 123/75 | HR 82 | Temp 97.8°F | Wt 168.9 lb

## 2014-07-16 DIAGNOSIS — G894 Chronic pain syndrome: Secondary | ICD-10-CM

## 2014-07-16 DIAGNOSIS — R5381 Other malaise: Secondary | ICD-10-CM | POA: Diagnosis not present

## 2014-07-16 DIAGNOSIS — K2100 Gastro-esophageal reflux disease with esophagitis, without bleeding: Secondary | ICD-10-CM | POA: Diagnosis not present

## 2014-07-16 DIAGNOSIS — M545 Low back pain, unspecified: Secondary | ICD-10-CM | POA: Diagnosis not present

## 2014-07-16 DIAGNOSIS — IMO0001 Reserved for inherently not codable concepts without codable children: Secondary | ICD-10-CM | POA: Insufficient documentation

## 2014-07-16 MED ORDER — OMEPRAZOLE 20 MG PO CPDR: 20.0000 mg | DELAYED_RELEASE_CAPSULE | Freq: Every day | ORAL | Status: DC | PRN

## 2014-07-16 MED ORDER — HYDROCODONE-ACETAMINOPHEN 7.5-325 MG PO TABS: 1.0000 | ORAL_TABLET | Freq: Four times a day (QID) | ORAL | Status: DC | PRN

## 2014-07-16 NOTE — Progress Notes (Signed)
   Subjective:    Patient ID: Emily Maddox, female    DOB: 1971-01-29, 43 y.o.   MRN: [id number]  HPI Pt is here to discuss back pain.   She was unhappy with neuro surgery.  Surgery is not an option at this time.  Pt due to go back to [facility].   Review of Systems    as above Objective:   Physical Exam  BP 123/75  Pulse 82  Temp(Src) 97.8 F (36.6 C) (Oral)  Wt 168 lb 14 oz (76.6 kg)  SpO2 100% General appearance: alert, cooperative, appears stated age and no distress Extremities: extremities normal, atraumatic, no cyanosis or edema Neurologic: Gait: Antalgic       Assessment & Plan:  1. Gastroesophageal reflux disease with esophagitis  - omeprazole (PRILOSEC) 20 MG capsule; Take 1 capsule (20 mg total) by mouth daily as needed (acid reflux).  Dispense: 90 capsule; Refill: 3  2. Chronic pain syndrome Pt will f/u [facility] - HYDROcodone-acetaminophen (NORCO) 7.5-325 MG per tablet; Take 1 tablet by mouth every 6 (six) hours as needed for moderate pain.  Dispense: 60 tablet; Refill: 0

## 2014-07-16 NOTE — Progress Notes (Signed)
Pre visit review using our clinic review tool, if applicable. No additional management support is needed unless otherwise documented below in the visit note. 

## 2014-07-21 ENCOUNTER — Ambulatory Visit: Payer: Medicare Other | Admitting: *Deleted

## 2014-07-21 DIAGNOSIS — IMO0001 Reserved for inherently not codable concepts without codable children: Secondary | ICD-10-CM | POA: Diagnosis not present

## 2014-07-23 ENCOUNTER — Ambulatory Visit: Payer: Medicare Other | Attending: Neurosurgery | Admitting: Physical Therapy

## 2014-07-23 DIAGNOSIS — Z5189 Encounter for other specified aftercare: Secondary | ICD-10-CM | POA: Diagnosis not present

## 2014-07-23 DIAGNOSIS — M545 Low back pain: Secondary | ICD-10-CM | POA: Insufficient documentation

## 2014-07-23 DIAGNOSIS — R5381 Other malaise: Secondary | ICD-10-CM | POA: Diagnosis not present

## 2014-07-23 DIAGNOSIS — M791 Myalgia: Secondary | ICD-10-CM | POA: Diagnosis not present

## 2014-07-23 DIAGNOSIS — M546 Pain in thoracic spine: Secondary | ICD-10-CM | POA: Diagnosis not present

## 2014-07-23 DIAGNOSIS — M542 Cervicalgia: Secondary | ICD-10-CM | POA: Diagnosis not present

## 2014-07-27 ENCOUNTER — Other Ambulatory Visit: Payer: Self-pay | Admitting: Obstetrics & Gynecology

## 2014-07-27 DIAGNOSIS — N8329 Other ovarian cysts: Secondary | ICD-10-CM | POA: Diagnosis not present

## 2014-07-27 DIAGNOSIS — N920 Excessive and frequent menstruation with regular cycle: Secondary | ICD-10-CM | POA: Diagnosis not present

## 2014-07-27 DIAGNOSIS — B373 Candidiasis of vulva and vagina: Secondary | ICD-10-CM | POA: Diagnosis not present

## 2014-07-27 DIAGNOSIS — N6459 Other signs and symptoms in breast: Secondary | ICD-10-CM | POA: Diagnosis not present

## 2014-07-27 DIAGNOSIS — Z124 Encounter for screening for malignant neoplasm of cervix: Secondary | ICD-10-CM | POA: Diagnosis not present

## 2014-07-28 ENCOUNTER — Ambulatory Visit: Payer: Medicare Other | Admitting: Physical Therapy

## 2014-07-28 DIAGNOSIS — Z5189 Encounter for other specified aftercare: Secondary | ICD-10-CM | POA: Diagnosis not present

## 2014-07-28 LAB — CYTOLOGY - PAP

## 2014-07-30 ENCOUNTER — Ambulatory Visit: Payer: Medicare Other | Admitting: Physical Therapy

## 2014-07-30 DIAGNOSIS — Z5189 Encounter for other specified aftercare: Secondary | ICD-10-CM | POA: Diagnosis not present

## 2014-07-31 ENCOUNTER — Telehealth: Payer: Self-pay | Admitting: Family Medicine

## 2014-07-31 DIAGNOSIS — M549 Dorsalgia, unspecified: Secondary | ICD-10-CM

## 2014-07-31 NOTE — Telephone Encounter (Signed)
Caller name: Dorma  Call back [place]   Reason for call:  Pt wants to know if she can be referred to a rheumatologist.  Prefers [place] or [place] area.

## 2014-07-31 NOTE — Telephone Encounter (Signed)
Patient seen the Spine Dr at [facility] who thinks her problem could be  fibromyalgia and would like for the patient to have a referral to rheumatology to evaluate. Please advise if it ok to place this referral.    KP

## 2014-08-01 NOTE — Telephone Encounter (Signed)
I am unclear where her pain is. I could not find note to that effect. We can refer but would like to send note specifying where her pain is. Or can you get specialist from [place] note. Scan to chart and then we can forward his note to rheumatologist. In summary, can refer but they will need some info. Can you go ahead and make the referral.

## 2014-08-03 NOTE — Telephone Encounter (Signed)
Ref has been placed.     KP 

## 2014-08-04 ENCOUNTER — Ambulatory Visit (INDEPENDENT_AMBULATORY_CARE_PROVIDER_SITE_OTHER): Payer: Medicare Other | Admitting: Neurology

## 2014-08-04 ENCOUNTER — Encounter: Payer: Self-pay | Admitting: Neurology

## 2014-08-04 VITALS — BP 118/80 | HR 84 | Ht 67.0 in | Wt 168.3 lb

## 2014-08-04 DIAGNOSIS — R52 Pain, unspecified: Secondary | ICD-10-CM | POA: Diagnosis not present

## 2014-08-04 DIAGNOSIS — R202 Paresthesia of skin: Secondary | ICD-10-CM

## 2014-08-04 DIAGNOSIS — H539 Unspecified visual disturbance: Secondary | ICD-10-CM

## 2014-08-04 DIAGNOSIS — G8929 Other chronic pain: Secondary | ICD-10-CM | POA: Diagnosis not present
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
## 2014-08-04 NOTE — Patient Instructions (Signed)
We will order MRI brain and call you with the results of your testing. If this is negative, I would strongly encourage you to follow-up with rheumatology.

## 2014-08-06 ENCOUNTER — Ambulatory Visit: Payer: Medicare Other | Admitting: Physical Therapy

## 2014-08-09 ENCOUNTER — Inpatient Hospital Stay
Admission: RE | Admit: 2014-08-09 | Discharge: 2014-08-09 | Disposition: A | Discharge status: Home / Self Care | Payer: Medicare Other | Source: Ambulatory Visit | Attending: Neurology | Admitting: Neurology

## 2014-08-11 ENCOUNTER — Ambulatory Visit: Payer: Medicare Other | Admitting: Physical Therapy

## 2014-08-11 DIAGNOSIS — Z5189 Encounter for other specified aftercare: Secondary | ICD-10-CM | POA: Diagnosis not present

## 2014-08-13 ENCOUNTER — Ambulatory Visit: Payer: Medicare Other | Admitting: Physical Therapy

## 2014-08-13 ENCOUNTER — Ambulatory Visit
Admission: RE | Admit: 2014-08-13 | Discharge: 2014-08-13 | Disposition: A | Discharge status: Home / Self Care | Payer: Medicare Other | Source: Ambulatory Visit | Attending: Neurology | Admitting: Neurology

## 2014-08-13 DIAGNOSIS — R202 Paresthesia of skin: Secondary | ICD-10-CM | POA: Diagnosis not present

## 2014-08-13 DIAGNOSIS — H539 Unspecified visual disturbance: Secondary | ICD-10-CM

## 2014-08-13 DIAGNOSIS — R52 Pain, unspecified: Secondary | ICD-10-CM

## 2014-08-13 DIAGNOSIS — Z5189 Encounter for other specified aftercare: Secondary | ICD-10-CM | POA: Diagnosis not present

## 2014-08-13 DIAGNOSIS — G8929 Other chronic pain: Secondary | ICD-10-CM

## 2014-08-13 MED ORDER — GADOBENATE DIMEGLUMINE 529 MG/ML IV SOLN
15.0000 mL | Freq: Once | INTRAVENOUS | Status: AC | PRN
  Administered 2014-08-13: 15 mL via INTRAVENOUS

## 2014-08-17 ENCOUNTER — Telehealth: Payer: Self-pay | Admitting: Family Medicine

## 2014-08-17 MED ORDER — HYDROCODONE-ACETAMINOPHEN 10-325 MG PO TABS: 1.0000 | ORAL_TABLET | Freq: Four times a day (QID) | ORAL | Status: DC | PRN

## 2014-08-17 NOTE — Telephone Encounter (Signed)
MSG left advising Rx ready for pick up.     KP 

## 2014-08-17 NOTE — Telephone Encounter (Signed)
Last seen and filled 07/16/14 #60 UDS 07/11/13 Low risk   Please advise     KP

## 2014-08-17 NOTE — Telephone Encounter (Signed)
norco 10 / 325  Same number and sig.  If rheum w/u is normal We will need to find pain management

## 2014-08-17 NOTE — Telephone Encounter (Signed)
Caller name: Lenee, Franze Relation to BR:KVTX  Call back number: [phone]   Reason for call: pt  requesting refill HYDROcodone-acetaminophen (NORCO) 7.5-325 MG per tablet pt states strength needs to be changed due to the weather. Please advise

## 2014-08-18 DIAGNOSIS — G5790 Unspecified mononeuropathy of unspecified lower limb: Secondary | ICD-10-CM | POA: Diagnosis not present

## 2014-08-18 DIAGNOSIS — M545 Low back pain: Secondary | ICD-10-CM | POA: Diagnosis not present

## 2014-08-18 DIAGNOSIS — M797 Fibromyalgia: Secondary | ICD-10-CM | POA: Diagnosis not present

## 2014-08-18 DIAGNOSIS — M542 Cervicalgia: Secondary | ICD-10-CM | POA: Diagnosis not present

## 2014-09-08 ENCOUNTER — Telehealth: Payer: Self-pay | Admitting: *Deleted

## 2014-09-08 NOTE — Telephone Encounter (Signed)
Received medical record request via fax from [place]. Forms forwarded to [facility]. JG//CMA

## 2014-09-10 ENCOUNTER — Ambulatory Visit (INDEPENDENT_AMBULATORY_CARE_PROVIDER_SITE_OTHER): Payer: Medicare Other | Admitting: Family Medicine

## 2014-09-10 ENCOUNTER — Other Ambulatory Visit: Payer: Self-pay

## 2014-09-10 ENCOUNTER — Telehealth: Payer: Self-pay | Admitting: Family Medicine

## 2014-09-10 ENCOUNTER — Encounter: Payer: Self-pay | Admitting: Family Medicine

## 2014-09-10 DIAGNOSIS — G4700 Insomnia, unspecified: Secondary | ICD-10-CM | POA: Diagnosis not present

## 2014-09-10 DIAGNOSIS — R1031 Right lower quadrant pain: Secondary | ICD-10-CM

## 2014-09-10 DIAGNOSIS — M797 Fibromyalgia: Secondary | ICD-10-CM | POA: Diagnosis not present

## 2014-09-10 DIAGNOSIS — M609 Myositis, unspecified: Secondary | ICD-10-CM

## 2014-09-10 DIAGNOSIS — E559 Vitamin D deficiency, unspecified: Secondary | ICD-10-CM

## 2014-09-10 DIAGNOSIS — M791 Myalgia: Secondary | ICD-10-CM

## 2014-09-10 DIAGNOSIS — IMO0001 Reserved for inherently not codable concepts without codable children: Secondary | ICD-10-CM

## 2014-09-10 LAB — VITAMIN D 25 HYDROXY (VIT D DEFICIENCY, FRACTURES): VITD: 19.3 ng/mL — ABNORMAL LOW (ref 30.00–100.00)

## 2014-09-10 MED ORDER — VITAMIN D (ERGOCALCIFEROL) 1.25 MG (50000 UNIT) PO CAPS: 50000.0000 [IU] | ORAL_CAPSULE | ORAL | Status: DC

## 2014-09-10 MED ORDER — CYCLOBENZAPRINE HCL 10 MG PO TABS: 10.0000 mg | ORAL_TABLET | Freq: Three times a day (TID) | ORAL | Status: DC | PRN

## 2014-09-10 MED ORDER — ALPRAZOLAM 0.5 MG PO TABS: 0.5000 mg | ORAL_TABLET | Freq: Every evening | ORAL | Status: DC | PRN

## 2014-09-10 MED ORDER — MILNACIPRAN HCL 12.5 & 25 & 50 MG PO MISC: ORAL | Status: DC

## 2014-09-10 NOTE — Progress Notes (Signed)
   Subjective:    Patient ID: Emily Maddox, female    DOB: 1971-05-01, 43 y.o.   MRN: [id number]  HPI Pt here to f/u rhuem ov.  She was dx with fibromyalgia.  Pt c/o myalgias and joint pains.  No new complaints.   Review of Systems As above     Objective:   Physical Exam BP 138/86 mmHg  Pulse 89  Temp(Src) 98.3 F (36.8 C) (Oral)  Wt 171 lb 12.8 oz (77.928 kg)  SpO2 100% General appearance: alert, cooperative, appears stated age and no distress Throat: lips, mucosa, and tongue normal; teeth and gums normal Neck: no adenopathy, supple, symmetrical, trachea midline and thyroid not enlarged, symmetric, no tenderness/mass/nodules Lungs: clear to auscultation bilaterally Heart: S1, S2 normal Extremities: extremities normal, atraumatic, no cyanosis or edema Neurologic: Alert and oriented X 3, normal strength and tone. Normal symmetric reflexes. Normal coordination and gait        Assessment & Plan:  1. Insomnia  - ALPRAZolam (XANAX) 0.5 MG tablet; Take 1 tablet (0.5 mg total) by mouth at bedtime as needed for anxiety.  Dispense: 30 tablet; Refill: 1  2. Fibromyalgia-- see rheum ov - cyclobenzaprine (FLEXERIL) 10 MG tablet; Take 1 tablet (10 mg total) by mouth 3 (three) times daily as needed for muscle spasms.  Dispense: 30 tablet; Refill: 0 - Milnacipran HCl 12.5 & 25 & 50 MG MISC; As directed  Dispense: 1 each; Refill: 0 - Vitamin D (25 hydroxy)  3. Myalgia and myositis  - Vitamin D (25 hydroxy)  4. Vitamin D deficiency  - Vitamin D (25 hydroxy)

## 2014-09-10 NOTE — Progress Notes (Signed)
Pre visit review using our clinic review tool, if applicable. No additional management support is needed unless otherwise documented below in the visit note. 

## 2014-09-10 NOTE — Telephone Encounter (Signed)
Please note patient would like to use [facility] [address], [place] [address] [phone] for now on.

## 2014-09-10 NOTE — Telephone Encounter (Signed)
Pharmacy updated. ° °KP °

## 2014-09-10 NOTE — Patient Instructions (Signed)
Fibromyalgia Fibromyalgia is a disorder that is often misunderstood. It is associated with muscular pains and tenderness that comes and goes. It is often associated with fatigue and sleep disturbances. Though it tends to be long-lasting, fibromyalgia is not life-threatening. CAUSES  The exact cause of fibromyalgia is unknown. People with certain gene types are predisposed to developing fibromyalgia and other conditions. Certain factors can play a role as triggers, such as:  Spine disorders.  Arthritis.  Severe injury (trauma) and other physical stressors.  Emotional stressors. SYMPTOMS   The main symptom is pain and stiffness in the muscles and joints, which can vary over time.  Sleep and fatigue problems. Other related symptoms may include:  Bowel and bladder problems.  Headaches.  Visual problems.  Problems with odors and noises.  Depression or mood changes.  Painful periods (dysmenorrhea).  Dryness of the skin or eyes. DIAGNOSIS  There are no specific tests for diagnosing fibromyalgia. Patients can be diagnosed accurately from the specific symptoms they have. The diagnosis is made by determining that nothing else is causing the problems. TREATMENT  There is no cure. Management includes medicines and an active, healthy lifestyle. The goal is to enhance physical fitness, decrease pain, and improve sleep. HOME CARE INSTRUCTIONS   Only take over-the-counter or prescription medicines as directed by your caregiver. Sleeping pills, tranquilizers, and pain medicines may make your problems worse.  Low-impact aerobic exercise is very important and advised for treatment. At first, it may seem to make pain worse. Gradually increasing your tolerance will overcome this feeling.  Learning relaxation techniques and how to control stress will help you. Biofeedback, visual imagery, hypnosis, muscle relaxation, yoga, and meditation are all options.  Anti-inflammatory medicines and  physical therapy may provide short-term help.  Acupuncture or massage treatments may help.  Take muscle relaxant medicines as suggested by your caregiver.  Avoid stressful situations.  Plan a healthy lifestyle. This includes your diet, sleep, rest, exercise, and friends.  Find and practice a hobby you enjoy.  Join a fibromyalgia support group for interaction, ideas, and sharing advice. This may be helpful. SEEK MEDICAL CARE IF:  You are not having good results or improvement from your treatment. FOR MORE INFORMATION  National Fibromyalgia Association: www.fmaware.org Arthritis Foundation: www.arthritis.org Document Released: 10/09/2005 Document Revised: 01/01/2012 Document Reviewed: 01/19/2010 ExitCare Patient Information 2015 ExitCare, LLC. This information is not intended to replace advice given to you by your health care provider. Make sure you discuss any questions you have with your health care provider.  

## 2014-09-24 ENCOUNTER — Telehealth: Payer: Self-pay | Admitting: Family Medicine

## 2014-09-24 MED ORDER — HYDROCODONE-ACETAMINOPHEN 10-325 MG PO TABS: 1.0000 | ORAL_TABLET | Freq: Four times a day (QID) | ORAL | Status: DC | PRN

## 2014-09-24 NOTE — Telephone Encounter (Signed)
Last seen 09/10/14 and filled 08/17/14 #60   Pleas advise      KP

## 2014-09-24 NOTE — Telephone Encounter (Signed)
Patient aware Rx ready for pick up.      KP 

## 2014-09-24 NOTE — Telephone Encounter (Signed)
Pt is requesting a refill of HYDROcodone-acetaminophen (NORCO) 10-325 MG per tablet

## 2014-09-24 NOTE — Telephone Encounter (Signed)
Refill x1 

## 2014-10-08 ENCOUNTER — Encounter: Payer: Self-pay | Admitting: Family Medicine

## 2014-10-08 ENCOUNTER — Ambulatory Visit (INDEPENDENT_AMBULATORY_CARE_PROVIDER_SITE_OTHER): Payer: Medicare Other | Admitting: Family Medicine

## 2014-10-08 VITALS — BP 130/72 | HR 91 | Temp 97.3°F | Wt 171.8 lb

## 2014-10-08 DIAGNOSIS — M609 Myositis, unspecified: Secondary | ICD-10-CM

## 2014-10-08 DIAGNOSIS — IMO0001 Reserved for inherently not codable concepts without codable children: Secondary | ICD-10-CM

## 2014-10-08 DIAGNOSIS — E785 Hyperlipidemia, unspecified: Secondary | ICD-10-CM

## 2014-10-08 DIAGNOSIS — M797 Fibromyalgia: Secondary | ICD-10-CM

## 2014-10-08 DIAGNOSIS — M791 Myalgia: Secondary | ICD-10-CM | POA: Diagnosis not present

## 2014-10-08 MED ORDER — MILNACIPRAN HCL 12.5 & 25 & 50 MG PO MISC: ORAL | Status: DC

## 2014-10-08 NOTE — Patient Instructions (Signed)
Vitamin D Deficiency Vitamin D is an important vitamin that your body needs. Having too little of it in your body is called a deficiency. A very bad deficiency can make your bones soft and can cause a condition called rickets.  Vitamin D is important to your body for different reasons, such as:   It helps your body absorb 2 minerals called calcium and phosphorus.  It helps make your bones healthy.  It may prevent some diseases, such as diabetes and multiple sclerosis.  It helps your muscles and heart. You can get vitamin D in several ways. It is a natural part of some foods. The vitamin is also added to some dairy products and cereals. Some people take vitamin D supplements. Also, your body makes vitamin D when you are in the sun. It changes the sun's rays into a form of the vitamin that your body can use. CAUSES   Not eating enough foods that contain vitamin D.  Not getting enough sunlight.  Having certain digestive system diseases that make it hard to absorb vitamin D. These diseases include Crohn's disease, chronic pancreatitis, and cystic fibrosis.  Having a surgery in which part of the stomach or small intestine is removed.  Being obese. Fat cells pull vitamin D out of your blood. That means that obese people may not have enough vitamin D left in their blood and in other body tissues.  Having chronic kidney or liver disease. RISK FACTORS Risk factors are things that make you more likely to develop a vitamin D deficiency. They include:  Being older.  Not being able to get outside very much.  Living in a nursing home.  Having had broken bones.  Having weak or thin bones (osteoporosis).  Having a disease or condition that changes how your body absorbs vitamin D.  Having dark skin.  Some medicines such as seizure medicines or steroids.  Being overweight or obese. SYMPTOMS Mild cases of vitamin D deficiency may not have any symptoms. If you have a very bad case, symptoms  may include:  Bone pain.  Muscle pain.  Falling often.  Broken bones caused by a minor injury, due to osteoporosis. DIAGNOSIS A blood test is the best way to tell if you have a vitamin D deficiency. TREATMENT Vitamin D deficiency can be treated in different ways. Treatment for vitamin D deficiency depends on what is causing it. Options include:  Taking vitamin D supplements.  Taking a calcium supplement. Your caregiver will suggest what dose is best for you. HOME CARE INSTRUCTIONS  Take any supplements that your caregiver prescribes. Follow the directions carefully. Take only the suggested amount.  Have your blood tested 2 months after you start taking supplements.  Eat foods that contain vitamin D. Healthy choices include:  Fortified dairy products, cereals, or juices. Fortified means vitamin D has been added to the food. Check the label on the package to be sure.  Fatty fish like salmon or trout.  Eggs.  Oysters.  Do not use a tanning bed.  Keep your weight at a healthy level. Lose weight if you need to.  Keep all follow-up appointments. Your caregiver will need to perform blood tests to make sure your vitamin D deficiency is going away. SEEK MEDICAL CARE IF:  You have any questions about your treatment.  You continue to have symptoms of vitamin D deficiency.  You have nausea or vomiting.  You are constipated.  You feel confused.  You have severe abdominal or back pain. MAKE   SURE YOU:  Understand these instructions.  Will watch your condition.  Will get help right away if you are not doing well or get worse. Document Released: 01/01/2012 Document Revised: 02/03/2013 Document Reviewed: 01/01/2012 ExitCare Patient Information 2015 ExitCare, LLC. This information is not intended to replace advice given to you by your health care provider. Make sure you discuss any questions you have with your health care provider.  

## 2014-10-08 NOTE — Progress Notes (Signed)
Pre visit review using our clinic review tool, if applicable. No additional management support is needed unless otherwise documented below in the visit note. 

## 2014-10-09 ENCOUNTER — Telehealth: Payer: Self-pay | Admitting: Family Medicine

## 2014-10-09 ENCOUNTER — Other Ambulatory Visit: Payer: Self-pay | Admitting: Family Medicine

## 2014-10-09 ENCOUNTER — Other Ambulatory Visit: Payer: Medicare Other

## 2014-10-09 DIAGNOSIS — M791 Myalgia: Secondary | ICD-10-CM | POA: Diagnosis not present

## 2014-10-09 DIAGNOSIS — E039 Hypothyroidism, unspecified: Secondary | ICD-10-CM | POA: Diagnosis not present

## 2014-10-09 DIAGNOSIS — M609 Myositis, unspecified: Secondary | ICD-10-CM | POA: Diagnosis not present

## 2014-10-09 LAB — LIPID PANEL
CHOL/HDL RATIO: 5
Cholesterol: 250 mg/dL — ABNORMAL HIGH (ref 0–200)
HDL: 54.7 mg/dL (ref >39.00)
LDL CALC: 179 mg/dL — AB (ref 0–99)
NonHDL: 195.3
TRIGLYCERIDES: 81 mg/dL (ref 0.0–149.0)
VLDL: 16.2 mg/dL (ref 0.0–40.0)

## 2014-10-09 LAB — CBC WITH DIFFERENTIAL/PLATELET
BASOS ABS: 0.1 10*3/uL (ref 0.0–0.1)
Basophils Relative: 0.9 % (ref 0.0–3.0)
EOS PCT: 3.1 % (ref 0.0–5.0)
Eosinophils Absolute: 0.2 10*3/uL (ref 0.0–0.7)
HCT: 41.6 % (ref 36.0–46.0)
Hemoglobin: 13.8 g/dL (ref 12.0–15.0)
Lymphocytes Relative: 28.1 % (ref 12.0–46.0)
Lymphs Abs: 2 10*3/uL (ref 0.7–4.0)
MCHC: 33.1 g/dL (ref 30.0–36.0)
MCV: 97.2 fl (ref 78.0–100.0)
MONO ABS: 0.3 10*3/uL (ref 0.1–1.0)
MONOS PCT: 4.2 % (ref 3.0–12.0)
NEUTROS PCT: 63.7 % (ref 43.0–77.0)
Neutro Abs: 4.4 10*3/uL (ref 1.4–7.7)
PLATELETS: 276 10*3/uL (ref 150.0–400.0)
RBC: 4.28 Mil/uL (ref 3.87–5.11)
RDW: 13.6 % (ref 11.5–15.5)
WBC: 7 10*3/uL (ref 4.0–10.5)

## 2014-10-09 LAB — BASIC METABOLIC PANEL
BUN: 13 mg/dL (ref 6–23)
CO2: 24 mEq/L (ref 19–32)
CREATININE: 1 mg/dL (ref 0.4–1.2)
Calcium: 9.1 mg/dL (ref 8.4–10.5)
Chloride: 109 mEq/L (ref 96–112)
GFR: 82.91 mL/min (ref >60.00)
Glucose, Bld: 96 mg/dL (ref 70–99)
Potassium: 4.4 mEq/L (ref 3.5–5.1)
Sodium: 141 mEq/L (ref 135–145)

## 2014-10-09 LAB — POCT URINALYSIS DIPSTICK
Bilirubin, UA: NEGATIVE
Glucose, UA: NEGATIVE
Ketones, UA: NEGATIVE
Leukocytes, UA: NEGATIVE
NITRITE UA: NEGATIVE
PH UA: 5.5
Protein, UA: NEGATIVE
RBC UA: NEGATIVE
Spec Grav, UA: >=1.03
Urobilinogen, UA: 2

## 2014-10-09 LAB — HEPATIC FUNCTION PANEL
ALT: 6 U/L (ref 0–35)
AST: 14 U/L (ref 0–37)
Albumin: 4.1 g/dL (ref 3.5–5.2)
Alkaline Phosphatase: 45 U/L (ref 39–117)
BILIRUBIN DIRECT: 0 mg/dL (ref 0.0–0.3)
BILIRUBIN TOTAL: 0.5 mg/dL (ref 0.2–1.2)
Total Protein: 7.5 g/dL (ref 6.0–8.3)

## 2014-10-09 LAB — RHEUMATOID FACTOR: Rhuematoid fact SerPl-aCnc: <10 IU/mL (ref <=14)

## 2014-10-09 LAB — MICROALBUMIN / CREATININE URINE RATIO
Creatinine,U: 195.6 mg/dL
MICROALB/CREAT RATIO: 0.2 mg/g (ref 0.0–30.0)
Microalb, Ur: 0.3 mg/dL (ref 0.0–1.9)

## 2014-10-09 NOTE — Telephone Encounter (Signed)
Please advise      KP 

## 2014-10-09 NOTE — Telephone Encounter (Signed)
Caller name:Klinedinst, Biddie Relation to LP:FXTK Call back [place]  Reason for call: pt states that her insurance will not pay for the rx  Milnacipran HCl 12.5 & 25 & 50 MG MISC , pt states you can try a prior authorization for it to see however she does not want to do cymbalta either. Would like to know what other options she has if the PA does not work. Also pt would like to know if she can take a small dosage of her xanax during the day.

## 2014-10-09 NOTE — Telephone Encounter (Signed)
She is not on cymbalta----try regular bactroban oint for nose -- same instructions Ok to take 1/2 - 1 xanax in day

## 2014-10-11 ENCOUNTER — Encounter: Payer: Self-pay | Admitting: Family Medicine

## 2014-10-11 NOTE — Progress Notes (Signed)
Subjective:    Patient ID: Emily Maddox, female    DOB: Sep 14, 1971, 43 y.o.   MRN: [id number]  HPI Pt here to f/u fibromyalgia--- she has not started the savella yet.  We also nee to check labs for choelesterol.    Past Medical History  Diagnosis Date  . Diabetes mellitus   . Hyperlipidemia   . 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 Hypertension   . Tachycardia    History   Social History  . Marital Status: Single    Spouse Name: N/A    Number of Children: N/A  . Years of Education: N/A   Occupational History  . [employer]--mailroom  p/t    Social History Main Topics  . Smoking status: Current Some Day Smoker -- 1.00 packs/day for 19 years    Types: Cigarettes  . Smokeless tobacco: Never Used     Comment: couldn't tolerate chantix, cut down to 0.5 pack since 11/2013  . Alcohol Use: No  . Drug Use: No  . Sexual Activity:    Partners: Female   Other Topics Concern  . Not on file   Social History Narrative   She lives with father.  No children.   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 in business administration   Exercise--  3-4 x a week   Current Outpatient Prescriptions  Medication Sig Dispense Refill  . ACCU-CHEK FASTCLIX LANCETS MISC CHECK BLOOD SUGAR DAILY. 102 each 11  . ALPRAZolam (XANAX) 0.5 MG tablet Take 1 tablet (0.5 mg total) by mouth at bedtime as needed for anxiety. 30 tablet 1  . cyclobenzaprine (FLEXERIL) 10 MG tablet Take 1 tablet (10 mg total) by mouth 3 (three) times daily as needed for muscle spasms. 30 tablet 0  . glucose blood (ACCU-CHEK AVIVA PLUS) test strip CHECK BLOOD SUGAR once daily 100 each 9  . HYDROcodone-acetaminophen (NORCO) 10-325 MG per tablet Take 1 tablet by mouth every 6 (six) hours as needed. 60 tablet 0  . metoprolol succinate (TOPROL-XL) 50 MG 24 hr tablet take 1 tablet by mouth once daily WITH OR IMMEDIATELY FOLLOWING A MEAL. 30 tablet 5  . omeprazole  (PRILOSEC) 20 MG capsule Take 1 capsule (20 mg total) by mouth daily as needed (acid reflux). 90 capsule 3  . Vitamin D, Ergocalciferol, (DRISDOL) 50000 UNITS CAPS capsule Take 1 capsule (50,000 Units total) by mouth every 7 (seven) days. 30 capsule 2  . Milnacipran HCl 12.5 & 25 & 50 MG MISC As directed 1 each 0   No current facility-administered medications for this visit.    Review of Systems As above    Objective:   Physical Exam  BP 130/72 mmHg  Pulse 91  Temp(Src) 97.3 F (36.3 C) (Oral)  Wt 171 lb 12.8 oz (77.928 kg)  SpO2 99% General appearance: alert, cooperative, appears stated age and no distress Nose: Nares normal. Septum midline. Mucosa normal. No drainage or sinus tenderness. Throat: lips, mucosa, and tongue normal; teeth and gums normal Neck: no adenopathy, no carotid bruit, no JVD, supple, symmetrical, trachea midline and thyroid not enlarged, symmetric, no tenderness/mass/nodules Lungs: clear to auscultation bilaterally Heart: regular rate and rhythm, S1, S2 normal, no murmur, click, rub or gallop Extremities: extremities normal, atraumatic, no cyanosis or edema       Assessment & Plan:  1. Fibromyalgia Check labs  - Milnacipran HCl 12.5 & 25 & 50 MG  MISC; As directed  Dispense: 1 each; Refill: 0 - Basic metabolic panel; Future - CBC with Differential; Future - Hepatic function panel; Future - Lipid panel; Future - Microalbumin / creatinine urine ratio; Future - POCT urinalysis dipstick; Future - Basic metabolic panel - CBC with Differential - Hepatic function panel - Lipid panel - Microalbumin / creatinine urine ratio - POCT urinalysis dipstick  2. Myalgia and myositis  - TSH; Future - ANA; Future - Rheumatoid factor; Future - TSH - ANA - Rheumatoid factor  3. Hyperlipidemia Check labs  - Lipid panel; Future - Lipid panel

## 2014-10-12 LAB — ANA: Anti Nuclear Antibody(ANA): NEGATIVE

## 2014-10-12 NOTE — Telephone Encounter (Signed)
The patient has no clue about the Bactroban, I made her aware of Dr.Lowne recommendations and she is ok with waiting for the PA to be completed.    KP

## 2014-10-12 NOTE — Telephone Encounter (Signed)
Her ins would not cover bactroban nasal--- I was just saying we could try the reg.   There is nothing like the savella that is generic---- cymbalta may be similar but she does not want that either.

## 2014-10-12 NOTE — Telephone Encounter (Signed)
Please review message, patient is not requesting ointment, she wants to know an alternative if the [place] is not covered.         KP

## 2014-10-13 ENCOUNTER — Encounter: Payer: Self-pay | Admitting: Family Medicine

## 2014-10-13 ENCOUNTER — Other Ambulatory Visit: Payer: Self-pay | Admitting: Family Medicine

## 2014-10-13 DIAGNOSIS — E785 Hyperlipidemia, unspecified: Secondary | ICD-10-CM

## 2014-10-13 LAB — TSH: TSH: 1.965 u[IU]/mL (ref 0.350–4.500)

## 2014-10-13 MED ORDER — EZETIMIBE 10 MG PO TABS: 10.0000 mg | ORAL_TABLET | Freq: Every day | ORAL | Status: DC

## 2014-10-21 ENCOUNTER — Telehealth: Payer: Self-pay | Admitting: *Deleted

## 2014-10-21 NOTE — Telephone Encounter (Signed)
Additional information faxed to [facility]. JG//CMA

## 2014-10-21 NOTE — Telephone Encounter (Signed)
PA initiated for Savella/Milnacipran HCl 12.5 & 25 & 50 MG MISC. Awaiting determination. JG//CMA

## 2014-10-21 NOTE — Telephone Encounter (Signed)
Denton Ar from [place] states that we need to call [phone] to get formulary questions that need to be answered.

## 2014-10-27 ENCOUNTER — Other Ambulatory Visit: Payer: Self-pay | Admitting: Internal Medicine

## 2014-10-27 MED ORDER — PREGABALIN 50 MG PO CAPS: 50.0000 mg | ORAL_CAPSULE | Freq: Two times a day (BID) | ORAL | Status: DC

## 2014-10-27 NOTE — Addendum Note (Signed)
Addended by: Murtis Sink A on: 10/27/2014 02:13 PM   Modules accepted: Orders

## 2014-10-27 NOTE — Telephone Encounter (Signed)
PA denied. Covered alternatives include Duloxetine and Lyrica.

## 2014-10-27 NOTE — Telephone Encounter (Signed)
Script for [employer] and placed in Dr. Nonda Lou red folder.  Called and spoke with patient and she was in agreement with starting Lyrica. JG//CMA

## 2014-10-27 NOTE — Telephone Encounter (Signed)
lyrica 50 mg bid #60  1 refill--- we can increase after several days if needed

## 2014-10-30 ENCOUNTER — Encounter: Payer: Self-pay | Admitting: Family Medicine

## 2014-10-30 MED ORDER — HYDROCODONE-ACETAMINOPHEN 10-325 MG PO TABS: 1.0000 | ORAL_TABLET | Freq: Four times a day (QID) | ORAL | Status: DC | PRN

## 2014-10-30 NOTE — Telephone Encounter (Signed)
Last seen 10/08/14 and filled 09/24/14 #60 No UDS  Please advise     KP

## 2014-11-02 NOTE — Telephone Encounter (Signed)
Prior authorization for Lyrica approved effective 10/23/2014 through 10/23/2015. JG//CMA

## 2014-11-30 ENCOUNTER — Other Ambulatory Visit: Payer: Self-pay | Admitting: Family Medicine

## 2014-11-30 MED ORDER — HYDROCODONE-ACETAMINOPHEN 10-325 MG PO TABS: 1.0000 | ORAL_TABLET | Freq: Four times a day (QID) | ORAL | Status: DC | PRN

## 2014-11-30 NOTE — Telephone Encounter (Signed)
Refill x1 

## 2014-11-30 NOTE — Telephone Encounter (Signed)
Last seen 10/08/14 and filled 10/30/14 #60 UDS 07/11/13 Low risk   Please advise     KP

## 2014-11-30 NOTE — Telephone Encounter (Signed)
Rx printed and placed in Dr. Lowne's red folder for review and signature. 

## 2014-11-30 NOTE — Telephone Encounter (Signed)
Caller name: Hendel Relation to pt: self Call back number: [phone] Pharmacy:  Reason for call:   Requesting a new hydrocodone rx

## 2014-11-30 NOTE — Telephone Encounter (Signed)
Patient aware Rx ready for pick up.      KP 

## 2014-12-28 ENCOUNTER — Ambulatory Visit (INDEPENDENT_AMBULATORY_CARE_PROVIDER_SITE_OTHER): Payer: Medicare Other | Admitting: Family Medicine

## 2014-12-28 ENCOUNTER — Encounter: Payer: Self-pay | Admitting: Family Medicine

## 2014-12-28 VITALS — BP 116/80 | HR 87 | Temp 98.3°F | Wt 175.6 lb

## 2014-12-28 DIAGNOSIS — M797 Fibromyalgia: Secondary | ICD-10-CM

## 2014-12-28 MED ORDER — VITAMIN D (ERGOCALCIFEROL) 1.25 MG (50000 UNIT) PO CAPS: 50000.0000 [IU] | ORAL_CAPSULE | ORAL | Status: DC

## 2014-12-28 NOTE — Progress Notes (Signed)
Pre visit review using our clinic review tool, if applicable. No additional management support is needed unless otherwise documented below in the visit note. 

## 2014-12-28 NOTE — Progress Notes (Signed)
Subjective:    Patient ID: Emily Maddox, female    DOB: 21-May-1971, 44 y.o.   MRN: [id number]  HPI  Patient here for a letter for SSD.    Past Medical History  Diagnosis Date  . Diabetes mellitus   . Hyperlipidemia   . 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 Hypertension   . Tachycardia     Review of Systems  Constitutional: Negative for activity change, appetite change, fatigue and unexpected weight change.  Respiratory: Negative for cough and shortness of breath.   Cardiovascular: Negative for chest pain and palpitations.  Musculoskeletal: Positive for myalgias, back pain, arthralgias and gait problem.  Psychiatric/Behavioral: Negative for suicidal ideas, behavioral problems, sleep disturbance, self-injury and dysphoric mood. The patient is not nervous/anxious.     Current Outpatient Prescriptions on File Prior to Visit  Medication Sig Dispense Refill  . ACCU-CHEK FASTCLIX LANCETS MISC CHECK BLOOD SUGAR DAILY. 102 each 11  . ALPRAZolam (XANAX) 0.5 MG tablet Take 1 tablet (0.5 mg total) by mouth at bedtime as needed for anxiety. 30 tablet 1  . cyclobenzaprine (FLEXERIL) 10 MG tablet Take 1 tablet (10 mg total) by mouth 3 (three) times daily as needed for muscle spasms. 30 tablet 0  . ezetimibe (ZETIA) 10 MG tablet Take 1 tablet (10 mg total) by mouth daily. 30 tablet 3  . glucose blood (ACCU-CHEK AVIVA PLUS) test strip CHECK BLOOD SUGAR once daily 100 each 9  . HYDROcodone-acetaminophen (NORCO) 10-325 MG per tablet Take 1 tablet by mouth every 6 (six) hours as needed. 60 tablet 0  . metoprolol succinate (TOPROL-XL) 50 MG 24 hr tablet take 1 tablet by mouth once daily with food 30 tablet 5  . Milnacipran HCl 12.5 & 25 & 50 MG MISC As directed 1 each 0  . omeprazole (PRILOSEC) 20 MG capsule Take 1 capsule (20 mg total) by mouth daily as needed (acid reflux). 90 capsule 3  . pregabalin (LYRICA) 50 MG capsule Take 1 capsule (50 mg  total) by mouth 2 (two) times daily. 60 capsule 1   No current facility-administered medications on file prior to visit.       Objective:    Physical Exam  Constitutional: She is oriented to person, place, and time. She appears well-developed and well-nourished. No distress.  HENT:  Right Ear: External ear normal.  Left Ear: External ear normal.  Nose: Nose normal.  Mouth/Throat: Oropharynx is clear and moist.  Eyes: EOM are normal. Pupils are equal, round, and reactive to light.  Neck: Normal range of motion. Neck supple.  Cardiovascular: Normal rate, regular rhythm and normal heart sounds.   No murmur heard. Pulmonary/Chest: Effort normal and breath sounds normal. No respiratory distress. She has no wheezes. She has no rales. She exhibits no tenderness.  Musculoskeletal: She exhibits tenderness. She exhibits no edema.  Neurological: She is alert and oriented to person, place, and time. She displays normal reflexes. Coordination normal.  Pt walks with a cane  Psychiatric: She has a normal mood and affect. Her behavior is normal. Judgment and thought content normal.    BP 116/80 mmHg  Pulse 87  Temp(Src) 98.3 F (36.8 C) (Oral)  Wt 175 lb 9.6 oz (79.652 kg)  SpO2 99% Wt Readings from Last 3 Encounters:  12/28/14 175 lb 9.6 oz (79.652 kg)  10/08/14 171 lb 12.8 oz (77.928 kg)  09/10/14 171 lb 12.8 oz (77.928 kg)     Lab Results  Component Value Date   WBC 7.0 10/09/2014   HGB 13.8 10/09/2014   HCT 41.6 10/09/2014   PLT 276.0 10/09/2014   GLUCOSE 96 10/09/2014   CHOL 250* 10/09/2014   TRIG 81.0 10/09/2014   HDL 54.70 10/09/2014   LDLDIRECT 143.7 11/12/2013   LDLCALC 179* 10/09/2014   ALT 6 10/09/2014   AST 14 10/09/2014   NA 141 10/09/2014   K 4.4 10/09/2014   CL 109 10/09/2014   CREATININE 1.0 10/09/2014   BUN 13 10/09/2014   CO2 24 10/09/2014   TSH 1.965 10/09/2014   HGBA1C 5.9 03/19/2014   MICROALBUR 0.3 10/09/2014       Assessment & Plan:   Problem  List Items Addressed This Visit    None    Visit Diagnoses    Fibromyalgia    -  Primary    Relevant Medications    Vitamin D, Ergocalciferol, (DRISDOL) 50000 UNITS CAPS capsule       I am having Ms. Sacks maintain her ACCU-CHEK FASTCLIX LANCETS, glucose blood, omeprazole, ALPRAZolam, cyclobenzaprine, Milnacipran HCl, ezetimibe, metoprolol succinate, pregabalin, HYDROcodone-acetaminophen, and Vitamin D (Ergocalciferol).  Meds ordered this encounter  Medications  . Vitamin D, Ergocalciferol, (DRISDOL) 50000 UNITS CAPS capsule    Sig: Take 1 capsule (50,000 Units total) by mouth every 7 (seven) days.    Dispense:  30 capsule    Refill:  [place], DO

## 2014-12-28 NOTE — Patient Instructions (Signed)
Fibromyalgia Fibromyalgia is a disorder that is often misunderstood. It is associated with muscular pains and tenderness that comes and goes. It is often associated with fatigue and sleep disturbances. Though it tends to be long-lasting, fibromyalgia is not life-threatening. CAUSES  The exact cause of fibromyalgia is unknown. People with certain gene types are predisposed to developing fibromyalgia and other conditions. Certain factors can play a role as triggers, such as:  Spine disorders.  Arthritis.  Severe injury (trauma) and other physical stressors.  Emotional stressors. SYMPTOMS   The main symptom is pain and stiffness in the muscles and joints, which can vary over time.  Sleep and fatigue problems. Other related symptoms may include:  Bowel and bladder problems.  Headaches.  Visual problems.  Problems with odors and noises.  Depression or mood changes.  Painful periods (dysmenorrhea).  Dryness of the skin or eyes. DIAGNOSIS  There are no specific tests for diagnosing fibromyalgia. Patients can be diagnosed accurately from the specific symptoms they have. The diagnosis is made by determining that nothing else is causing the problems. TREATMENT  There is no cure. Management includes medicines and an active, healthy lifestyle. The goal is to enhance physical fitness, decrease pain, and improve sleep. HOME CARE INSTRUCTIONS   Only take over-the-counter or prescription medicines as directed by your caregiver. Sleeping pills, tranquilizers, and pain medicines may make your problems worse.  Low-impact aerobic exercise is very important and advised for treatment. At first, it may seem to make pain worse. Gradually increasing your tolerance will overcome this feeling.  Learning relaxation techniques and how to control stress will help you. Biofeedback, visual imagery, hypnosis, muscle relaxation, yoga, and meditation are all options.  Anti-inflammatory medicines and  physical therapy may provide short-term help.  Acupuncture or massage treatments may help.  Take muscle relaxant medicines as suggested by your caregiver.  Avoid stressful situations.  Plan a healthy lifestyle. This includes your diet, sleep, rest, exercise, and friends.  Find and practice a hobby you enjoy.  Join a fibromyalgia support group for interaction, ideas, and sharing advice. This may be helpful. SEEK MEDICAL CARE IF:  You are not having good results or improvement from your treatment. FOR MORE INFORMATION  National Fibromyalgia Association: www.fmaware.org Arthritis Foundation: www.arthritis.org Document Released: 10/09/2005 Document Revised: 01/01/2012 Document Reviewed: 01/19/2010 ExitCare Patient Information 2015 ExitCare, LLC. This information is not intended to replace advice given to you by your health care provider. Make sure you discuss any questions you have with your health care provider.  

## 2015-01-01 ENCOUNTER — Telehealth: Payer: Self-pay | Admitting: Family Medicine

## 2015-01-01 NOTE — Telephone Encounter (Signed)
Caller name: Ariam Relation to pt: self Call back number:  [phone] Pharmacy:  Reason for call:   Requesting a new hydrocodone rx  Requesting a refill of metoprol to [facility] on [address]

## 2015-01-01 NOTE — Telephone Encounter (Signed)
Requesting: HYDROcodone-acetaminophen ([place]) 10-325 MG  Contract none on file UDS none ion file Last OV 12/28/2014 Last Refill 11/30/2014--#60--no refills  Please Advise on above  Called in Metoprolol 50mg  #30 3 refills (last one sent to [place])

## 2015-01-01 NOTE — Telephone Encounter (Deleted)
Requesting:  HYDROcodone-acetaminophen (NORCO) 10-325 MG per tablet     Contract UDS Last OV Last Refill  Please Advise

## 2015-01-04 MED ORDER — HYDROCODONE-ACETAMINOPHEN 10-325 MG PO TABS: 1.0000 | ORAL_TABLET | Freq: Four times a day (QID) | ORAL | Status: DC | PRN

## 2015-01-04 NOTE — Telephone Encounter (Signed)
Detailed message left advising Rx ready for pick up.     KP 

## 2015-01-04 NOTE — Telephone Encounter (Signed)
Refill x1 

## 2015-01-07 ENCOUNTER — Ambulatory Visit: Payer: Medicare Other | Admitting: Family Medicine

## 2015-01-22 ENCOUNTER — Telehealth: Payer: Self-pay | Admitting: Family Medicine

## 2015-01-22 MED ORDER — ACCU-CHEK FASTCLIX LANCETS MISC: Status: DC

## 2015-01-22 NOTE — Telephone Encounter (Signed)
Caller name: [facility]  Relation to pt: pharmacy  Call back number: [phone]  Pharmacy: [facility] [address] [place] [phone] (Phone) [phone] (Fax)         Reason for call:  PHARMACY requesting ACCU-CHEK FASTCLIX LANCETS MISC and glucose blood (ACCU-CHEK AVIVA PLUS) test strip

## 2015-01-22 NOTE — Telephone Encounter (Signed)
Rx faxed.    KP 

## 2015-02-03 ENCOUNTER — Other Ambulatory Visit: Payer: Self-pay | Admitting: Family Medicine

## 2015-02-03 MED ORDER — ACCU-CHEK FASTCLIX LANCETS MISC: Status: DC

## 2015-02-03 MED ORDER — HYDROCODONE-ACETAMINOPHEN 10-325 MG PO TABS: 1.0000 | ORAL_TABLET | Freq: Four times a day (QID) | ORAL | Status: DC | PRN

## 2015-02-03 MED ORDER — GLUCOSE BLOOD VI STRP: ORAL_STRIP | Status: DC

## 2015-02-03 NOTE — Telephone Encounter (Signed)
Patient aware Rx ready for pick up.      KP 

## 2015-02-03 NOTE — Telephone Encounter (Signed)
Caller name: Larcenia Relation to pt: self Call back number: [phone] Pharmacy: [facility] in [place]  Reason for call:   Requesting hydrocodone refill  Also, needs new rx for accu-check fast click lancets and test strips

## 2015-02-03 NOTE — Telephone Encounter (Signed)
Ok for #60, no refills 

## 2015-02-03 NOTE — Telephone Encounter (Signed)
Last seen 12/28/14 and filled 01/04/15 #60 UDS 07/01/13 low risk    Please advise    KP

## 2015-03-03 ENCOUNTER — Telehealth: Payer: Self-pay | Admitting: Family Medicine

## 2015-03-03 NOTE — Telephone Encounter (Signed)
Caller name: Coni Relation to pt: self Call back number: [phone] Pharmacy:  Reason for call:   Requesting hydrocodone refill

## 2015-03-04 MED ORDER — HYDROCODONE-ACETAMINOPHEN 10-325 MG PO TABS: 1.0000 | ORAL_TABLET | Freq: Four times a day (QID) | ORAL | Status: DC | PRN

## 2015-03-04 NOTE — Telephone Encounter (Signed)
Refill x1 

## 2015-03-04 NOTE — Telephone Encounter (Signed)
Patient aware Rx ready for pick up.      KP 

## 2015-03-04 NOTE — Telephone Encounter (Signed)
Last seen 12/28/14 and filled 02/03/15 #60 UDS 07/11/13 low risk   Please advise     KP

## 2015-04-06 ENCOUNTER — Ambulatory Visit (INDEPENDENT_AMBULATORY_CARE_PROVIDER_SITE_OTHER): Payer: Medicare Other | Admitting: Physician Assistant

## 2015-04-06 ENCOUNTER — Telehealth: Payer: Self-pay | Admitting: Family Medicine

## 2015-04-06 ENCOUNTER — Encounter: Payer: Self-pay | Admitting: Physician Assistant

## 2015-04-06 VITALS — BP 108/69 | HR 104 | Temp 98.2°F | Resp 16 | Ht 66.0 in | Wt 173.4 lb

## 2015-04-06 DIAGNOSIS — J019 Acute sinusitis, unspecified: Secondary | ICD-10-CM

## 2015-04-06 DIAGNOSIS — B9689 Other specified bacterial agents as the cause of diseases classified elsewhere: Secondary | ICD-10-CM

## 2015-04-06 DIAGNOSIS — Z79891 Long term (current) use of opiate analgesic: Secondary | ICD-10-CM | POA: Diagnosis not present

## 2015-04-06 MED ORDER — AMOXICILLIN-POT CLAVULANATE 875-125 MG PO TABS: 1.0000 | ORAL_TABLET | Freq: Two times a day (BID) | ORAL | Status: DC

## 2015-04-06 MED ORDER — HYDROCODONE-ACETAMINOPHEN 10-325 MG PO TABS: 1.0000 | ORAL_TABLET | Freq: Four times a day (QID) | ORAL | Status: DC | PRN

## 2015-04-06 NOTE — Progress Notes (Signed)
Pre visit review using our clinic review tool, if applicable. No additional management support is needed unless otherwise documented below in the visit note/SLS  

## 2015-04-06 NOTE — Assessment & Plan Note (Signed)
Rx Augmentin.  Increase fluids.  Rest.  Saline nasal spray.  Probiotic.  Mucinex as directed.  Humidifier in bedroom.  Call or return to clinic if symptoms are not improving.  

## 2015-04-06 NOTE — Telephone Encounter (Signed)
Last seen 12/28/14 and filled 03/04/15  UDS 07/11/13 low risk #60  Please advise     KP

## 2015-04-06 NOTE — Telephone Encounter (Signed)
Refill x1 

## 2015-04-06 NOTE — Telephone Encounter (Signed)
Rx printed and left at check in. Patient needs a UDS.      KP

## 2015-04-06 NOTE — Telephone Encounter (Signed)
Caller name: Carney Corners Relationship to patient: self Can be reached: [phone] Pharmacy:  Reason for call: Pt asking for refill on HYDROcodone-acetaminophen (NORCO) 10-325 MG per tablet. She has 3 left. She is coming in today to see Einar Pheasant for possible sinus infection at 4:00pm. Can she pick up then?

## 2015-04-06 NOTE — Progress Notes (Signed)
Patient presents to clinic today c/o 3 weeks of worsening sinus pressure, sinus pain, tooth pain bilaterally and ear pressure.  Endorses sinus headaches.  Denies fever, chills, SOB or chest pain.  Endorses rare dry cough.  Denies recent travel or sick contact.  Past Medical History  Diagnosis Date  . Diabetes mellitus   . Hyperlipidemia   . 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 Hypertension   . Tachycardia     Current Outpatient Prescriptions on File Prior to Visit  Medication Sig Dispense Refill  . ACCU-CHEK FASTCLIX LANCETS MISC CHECK BLOOD SUGAR DAILY. Dx. E11.9 102 each 11  . ALPRAZolam (XANAX) 0.5 MG tablet Take 1 tablet (0.5 mg total) by mouth at bedtime as needed for anxiety. 30 tablet 1  . cyclobenzaprine (FLEXERIL) 10 MG tablet Take 1 tablet (10 mg total) by mouth 3 (three) times daily as needed for muscle spasms. 30 tablet 0  . ezetimibe (ZETIA) 10 MG tablet Take 1 tablet (10 mg total) by mouth daily. 30 tablet 3  . glucose blood (ACCU-CHEK AVIVA PLUS) test strip CHECK BLOOD SUGAR once daily. Dx. E11.9 100 each 9  . metoprolol succinate (TOPROL-XL) 50 MG 24 hr tablet take 1 tablet by mouth once daily with food 30 tablet 5  . Milnacipran HCl 12.5 & 25 & 50 MG MISC As directed 1 each 0  . omeprazole (PRILOSEC) 20 MG capsule Take 1 capsule (20 mg total) by mouth daily as needed (acid reflux). 90 capsule 3  . pregabalin (LYRICA) 50 MG capsule Take 1 capsule (50 mg total) by mouth 2 (two) times daily. 60 capsule 1   No current facility-administered medications on file prior to visit.    Allergies  Allergen Reactions  . Ultram [Tramadol] Palpitations    Family History  Problem Relation Age of Onset  . Breast cancer    . Colon cancer    . Cancer Mother 49    breast  . Hypertension Mother   . Hyperlipidemia Mother   . Diabetes Mother   . Cancer Father 76    colon  . Hypertension Father   . Hyperlipidemia Father   .  Heart disease Father     a fib  . Diabetes Father   . Diabetes Sister     History   Social History  . Marital Status: Single    Spouse Name: N/A  . Number of Children: N/A  . Years of Education: N/A   Occupational History  . [employer]--mailroom  p/t    Social History Main Topics  . Smoking status: Current Some Day Smoker -- 1.00 packs/day for 19 years    Types: Cigarettes  . Smokeless tobacco: Never Used     Comment: couldn't tolerate chantix, cut down to 0.5 pack since 11/2013  . Alcohol Use: No  . Drug Use: No  . Sexual Activity:    Partners: Female   Other Topics Concern  . None   Social History Narrative   She lives with father.  No children.   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 in business administration   Exercise--  3-4 x a week   Review of Systems - See HPI.  All other ROS are negative.  BP 108/69 mmHg  Pulse 104  Temp(Src) 98.2 F (36.8 C) (Oral)  Resp 16  Ht 5\' 6"  (1.676 m)  Wt 173 lb 6 oz (78.642 kg)  BMI 28.00 kg/m2  SpO2 100%  LMP 03/09/2015  Physical Exam  Constitutional: She is oriented to person, place, and time and well-developed, well-nourished, and in no distress.  HENT:  Head: Normocephalic and atraumatic.  Right Ear: Tympanic membrane, external ear and ear canal normal.  Left Ear: Tympanic membrane, external ear and ear canal normal.  Nose: Right sinus exhibits maxillary sinus tenderness. Left sinus exhibits maxillary sinus tenderness.  Mouth/Throat: Uvula is midline, oropharynx is clear and moist and mucous membranes are normal. No oropharyngeal exudate.  Eyes: Conjunctivae are normal.  Neck: Neck supple.  Cardiovascular: Normal rate, regular rhythm, normal heart sounds and intact distal pulses.   Pulmonary/Chest: Effort normal and breath sounds normal. No respiratory distress. She has no wheezes. She has no rales. She exhibits no tenderness.  Lymphadenopathy:    She has no cervical adenopathy.  Neurological:  She is alert and oriented to person, place, and time.  Skin: Skin is warm and dry. No rash noted.  Psychiatric: Affect normal.  Vitals reviewed.    Assessment/Plan: Acute bacterial sinusitis Rx Augmentin.  Increase fluids.  Rest.  Saline nasal spray.  Probiotic.  Mucinex as directed.  Humidifier in bedroom.  Call or return to clinic if symptoms are not improving.

## 2015-04-06 NOTE — Patient Instructions (Signed)
Please take antibiotic as directed.  Increase fluid intake.  Use Saline nasal spray.  Take a daily multivitamin. Use Mucinex-DM for cough and mucous.  Place a humidifier in the bedroom.  Please call or return clinic if symptoms are not improving.  Sinusitis Sinusitis is redness, soreness, and swelling (inflammation) of the paranasal sinuses. Paranasal sinuses are air pockets within the bones of your face (beneath the eyes, the middle of the forehead, or above the eyes). In healthy paranasal sinuses, mucus is able to drain out, and air is able to circulate through them by way of your nose. However, when your paranasal sinuses are inflamed, mucus and air can become trapped. This can allow bacteria and other germs to grow and cause infection. Sinusitis can develop quickly and last only a short time (acute) or continue over a long period (chronic). Sinusitis that lasts for more than 12 weeks is considered chronic.  CAUSES  Causes of sinusitis include:  Allergies.  Structural abnormalities, such as displacement of the cartilage that separates your nostrils (deviated septum), which can decrease the air flow through your nose and sinuses and affect sinus drainage.  Functional abnormalities, such as when the small hairs (cilia) that line your sinuses and help remove mucus do not work properly or are not present. SYMPTOMS  Symptoms of acute and chronic sinusitis are the same. The primary symptoms are pain and pressure around the affected sinuses. Other symptoms include:  Upper toothache.  Earache.  Headache.  Bad breath.  Decreased sense of smell and taste.  A cough, which worsens when you are lying flat.  Fatigue.  Fever.  Thick drainage from your nose, which often is green and may contain pus (purulent).  Swelling and warmth over the affected sinuses. DIAGNOSIS  Your caregiver will perform a physical exam. During the exam, your caregiver may:  Look in your nose for signs of abnormal  growths in your nostrils (nasal polyps).  Tap over the affected sinus to check for signs of infection.  View the inside of your sinuses (endoscopy) with a special imaging device with a light attached (endoscope), which is inserted into your sinuses. If your caregiver suspects that you have chronic sinusitis, one or more of the following tests may be recommended:  Allergy tests.  Nasal culture A sample of mucus is taken from your nose and sent to a lab and screened for bacteria.  Nasal cytology A sample of mucus is taken from your nose and examined by your caregiver to determine if your sinusitis is related to an allergy. TREATMENT  Most cases of acute sinusitis are related to a viral infection and will resolve on their own within 10 days. Sometimes medicines are prescribed to help relieve symptoms (pain medicine, decongestants, nasal steroid sprays, or saline sprays).  However, for sinusitis related to a bacterial infection, your caregiver will prescribe antibiotic medicines. These are medicines that will help kill the bacteria causing the infection.  Rarely, sinusitis is caused by a fungal infection. In theses cases, your caregiver will prescribe antifungal medicine. For some cases of chronic sinusitis, surgery is needed. Generally, these are cases in which sinusitis recurs more than 3 times per year, despite other treatments. HOME CARE INSTRUCTIONS   Drink plenty of water. Water helps thin the mucus so your sinuses can drain more easily.  Use a humidifier.  Inhale steam 3 to 4 times a day (for example, sit in the bathroom with the shower running).  Apply a warm, moist washcloth to your face  3 to 4 times a day, or as directed by your caregiver.  Use saline nasal sprays to help moisten and clean your sinuses.  Take over-the-counter or prescription medicines for pain, discomfort, or fever only as directed by your caregiver. SEEK IMMEDIATE MEDICAL CARE IF:  You have increasing pain or  severe headaches.  You have nausea, vomiting, or drowsiness.  You have swelling around your face.  You have vision problems.  You have a stiff neck.  You have difficulty breathing. MAKE SURE YOU:   Understand these instructions.  Will watch your condition.  Will get help right away if you are not doing well or get worse. Document Released: 10/09/2005 Document Revised: 01/01/2012 Document Reviewed: 10/24/2011 ExitCare Patient Information 2014 [employer].   

## 2015-04-16 ENCOUNTER — Telehealth: Payer: Self-pay | Admitting: Family Medicine

## 2015-04-16 MED ORDER — CLARITHROMYCIN ER 500 MG PO TB24: 1000.0000 mg | ORAL_TABLET | Freq: Every day | ORAL | Status: DC

## 2015-04-16 MED ORDER — FLUCONAZOLE 150 MG PO TABS: 150.0000 mg | ORAL_TABLET | Freq: Once | ORAL | Status: DC

## 2015-04-16 NOTE — Telephone Encounter (Signed)
Caller name: Marnette Relation to pt: self Call back number: [phone] Pharmacy: [facility] in [place]  Reason for call:   Patient states that she is still having facial pain and pressure since last week visit and wants to know what else she should do

## 2015-04-16 NOTE — Telephone Encounter (Signed)
biaxin xl 500 mg 2 po qd x 14 days--- ov if no better

## 2015-04-16 NOTE — Telephone Encounter (Signed)
Patient was seen by Einar Pheasant on 04/06/15 and treated with Augment, still having facial pain. Please advise     KP

## 2015-04-16 NOTE — Telephone Encounter (Signed)
The patient is aware that the Biaxin is being called in to the pharmacy.     KP

## 2015-04-19 ENCOUNTER — Telehealth: Payer: Self-pay | Admitting: Family Medicine

## 2015-04-19 ENCOUNTER — Other Ambulatory Visit: Payer: Self-pay

## 2015-04-19 MED ORDER — CEFDINIR 300 MG PO CAPS: 300.0000 mg | ORAL_CAPSULE | Freq: Two times a day (BID) | ORAL | Status: DC

## 2015-04-19 NOTE — Telephone Encounter (Signed)
omnicef 300 mg  1po q12 x 10 days

## 2015-04-19 NOTE — Telephone Encounter (Signed)
I called [phone] and made the patient aware that the Rx was faxed.      KP

## 2015-04-19 NOTE — Telephone Encounter (Signed)
Caller name: Mammie Relationship to patient:self  Can be [phone] Pharmacy: [facility] [place]  Reason for call: the antibiotic that was called in her insurance will not pay for.  Please call in something different

## 2015-04-19 NOTE — Telephone Encounter (Signed)
on 04/16/15 clarithromycin (BIAXIN XL) 500 MG 24 hr tablet was sent but he insurance company will not pay for it, please advise on an alternative.     KP

## 2015-04-20 ENCOUNTER — Encounter: Payer: Self-pay | Admitting: Family Medicine

## 2015-05-05 ENCOUNTER — Telehealth: Payer: Self-pay | Admitting: Family Medicine

## 2015-05-05 DIAGNOSIS — E785 Hyperlipidemia, unspecified: Secondary | ICD-10-CM

## 2015-05-05 NOTE — Telephone Encounter (Signed)
Ok to refill x1 and yes she needs labs--- order is in

## 2015-05-05 NOTE — Telephone Encounter (Signed)
Caller name: Carney Corners Relationship to patient: self Can be reached: [phone]  Reason for call: Pt asking for refill of hydrocodone. She has 2 days left. Please notify pt when ready to pick up. Pt also asking if she can have labs done again or if she needs appt. Pt last saw Dr. Etter Sjogren 12/28/14. Last labs in 09/2014.

## 2015-05-05 NOTE — Telephone Encounter (Signed)
Last seen 12/28/14 and filled 04/06/15 #60 UDS 04/06/15 low risk  Please advise    KP

## 2015-05-06 MED ORDER — HYDROCODONE-ACETAMINOPHEN 10-325 MG PO TABS: 1.0000 | ORAL_TABLET | Freq: Four times a day (QID) | ORAL | Status: DC | PRN

## 2015-05-06 NOTE — Telephone Encounter (Signed)
patient aware Rx ready for pick up.    KP 

## 2015-05-07 ENCOUNTER — Telehealth: Payer: Self-pay | Admitting: Family Medicine

## 2015-05-07 ENCOUNTER — Other Ambulatory Visit (INDEPENDENT_AMBULATORY_CARE_PROVIDER_SITE_OTHER): Payer: Medicare Other

## 2015-05-07 DIAGNOSIS — E785 Hyperlipidemia, unspecified: Secondary | ICD-10-CM | POA: Diagnosis not present

## 2015-05-07 DIAGNOSIS — Z1231 Encounter for screening mammogram for malignant neoplasm of breast: Secondary | ICD-10-CM

## 2015-05-07 LAB — LIPID PANEL
CHOL/HDL RATIO: 4
CHOLESTEROL: 205 mg/dL — AB (ref 0–200)
HDL: 54.6 mg/dL (ref >39.00)
LDL Cholesterol: 135 mg/dL — ABNORMAL HIGH (ref 0–99)
NonHDL: 150.4
Triglycerides: 75 mg/dL (ref 0.0–149.0)
VLDL: 15 mg/dL (ref 0.0–40.0)

## 2015-05-07 LAB — HEPATIC FUNCTION PANEL
ALBUMIN: 4 g/dL (ref 3.5–5.2)
ALT: 8 U/L (ref 0–35)
AST: 14 U/L (ref 0–37)
Alkaline Phosphatase: 45 U/L (ref 39–117)
BILIRUBIN TOTAL: 0.4 mg/dL (ref 0.2–1.2)
Bilirubin, Direct: 0 mg/dL (ref 0.0–0.3)
Total Protein: 6.9 g/dL (ref 6.0–8.3)

## 2015-05-07 NOTE — Telephone Encounter (Signed)
Discussed with patient A1c not done today, orders were for cholesterol. CPE is due so we scheduled if for Oct 17.I advised we would do all labs at that time and she verbalized understanding.     KP

## 2015-05-07 NOTE — Telephone Encounter (Signed)
Caller name:Raechel Relation to pt: Call back [place]:  Reason for call:   Wants to talk to [place] regarding A1c

## 2015-05-11 ENCOUNTER — Other Ambulatory Visit: Payer: Self-pay | Admitting: Family Medicine

## 2015-06-07 ENCOUNTER — Telehealth: Payer: Self-pay | Admitting: Family Medicine

## 2015-06-07 MED ORDER — HYDROCODONE-ACETAMINOPHEN 10-325 MG PO TABS: 1.0000 | ORAL_TABLET | Freq: Four times a day (QID) | ORAL | Status: DC | PRN

## 2015-06-07 NOTE — Telephone Encounter (Signed)
Relation to BO:BOFP Call back number:[phone]   Reason for call:  Patient requesting a refill HYDROcodone-acetaminophen (NORCO) 10-325 MG per tablet

## 2015-06-07 NOTE — Telephone Encounter (Signed)
Last seen 12/28/14 and filled 04/26/15 #60 UDS 04/06/15 low risk   Please advise    KP

## 2015-06-07 NOTE — Telephone Encounter (Signed)
Refill x1 

## 2015-06-07 NOTE — Telephone Encounter (Signed)
Msg left advising Rx ready for pick up.     KP

## 2015-06-30 ENCOUNTER — Telehealth: Payer: Self-pay | Admitting: *Deleted

## 2015-06-30 NOTE — Telephone Encounter (Signed)
Pt signed ROI received via fax from [place]. Forwarded to [place] to scan/email to medical records. JG//CMA

## 2015-07-08 ENCOUNTER — Telehealth: Payer: Self-pay | Admitting: Family Medicine

## 2015-07-08 MED ORDER — HYDROCODONE-ACETAMINOPHEN 10-325 MG PO TABS: 1.0000 | ORAL_TABLET | Freq: Four times a day (QID) | ORAL | Status: DC | PRN

## 2015-07-08 NOTE — Telephone Encounter (Signed)
Last seen 12/28/14 and filled 06/07/15 #60 UDS 04/06/15 low risk   Please advise    KP

## 2015-07-08 NOTE — Telephone Encounter (Signed)
Refill x1 

## 2015-07-08 NOTE — Telephone Encounter (Signed)
Signed rx of hydrocodone for supervising md in her absence(med verbaly aurthorized by MD per MD MA). Pt gest me regularly and get screens regular. Low risk. Did sign rx.

## 2015-07-08 NOTE — Telephone Encounter (Signed)
Pt needing refill on hydrocodone. Has enough for tomorrow. Advised to give 3-5 day notice in future. Please call when rx ready. Best # [phone]

## 2015-07-08 NOTE — Telephone Encounter (Signed)
Dr.Lowne is gone for the day and will not be here tomorrow. Would you sign in her absence.     KP

## 2015-07-26 ENCOUNTER — Encounter: Payer: Self-pay | Admitting: Family Medicine

## 2015-07-26 ENCOUNTER — Ambulatory Visit (INDEPENDENT_AMBULATORY_CARE_PROVIDER_SITE_OTHER): Payer: Medicare Other | Admitting: Family Medicine

## 2015-07-26 VITALS — BP 120/70 | HR 95 | Temp 98.1°F | Ht 66.0 in | Wt 173.0 lb

## 2015-07-26 DIAGNOSIS — E1151 Type 2 diabetes mellitus with diabetic peripheral angiopathy without gangrene: Secondary | ICD-10-CM | POA: Diagnosis not present

## 2015-07-26 DIAGNOSIS — M797 Fibromyalgia: Secondary | ICD-10-CM

## 2015-07-26 DIAGNOSIS — E785 Hyperlipidemia, unspecified: Secondary | ICD-10-CM

## 2015-07-26 LAB — MICROALBUMIN / CREATININE URINE RATIO
Creatinine,U: 127.8 mg/dL
Microalb Creat Ratio: 0.5 mg/g (ref 0.0–30.0)
Microalb, Ur: <0.7 mg/dL (ref 0.0–1.9)

## 2015-07-26 LAB — COMPREHENSIVE METABOLIC PANEL
ALT: 7 U/L (ref 0–35)
AST: 15 U/L (ref 0–37)
Albumin: 4.2 g/dL (ref 3.5–5.2)
Alkaline Phosphatase: 51 U/L (ref 39–117)
BILIRUBIN TOTAL: 0.3 mg/dL (ref 0.2–1.2)
BUN: 9 mg/dL (ref 6–23)
CHLORIDE: 104 meq/L (ref 96–112)
CO2: 26 meq/L (ref 19–32)
CREATININE: 0.86 mg/dL (ref 0.40–1.20)
Calcium: 9.1 mg/dL (ref 8.4–10.5)
GFR: 92.65 mL/min (ref >60.00)
Glucose, Bld: 89 mg/dL (ref 70–99)
Potassium: 4.2 mEq/L (ref 3.5–5.1)
SODIUM: 138 meq/L (ref 135–145)
Total Protein: 7.3 g/dL (ref 6.0–8.3)

## 2015-07-26 LAB — POCT URINALYSIS DIPSTICK
Bilirubin, UA: NEGATIVE
Blood, UA: NEGATIVE
Glucose, UA: NEGATIVE
Ketones, UA: NEGATIVE
LEUKOCYTES UA: NEGATIVE
NITRITE UA: NEGATIVE
PROTEIN UA: NEGATIVE
Spec Grav, UA: 1.02
UROBILINOGEN UA: NEGATIVE
pH, UA: 6

## 2015-07-26 LAB — HEMOGLOBIN A1C: Hgb A1c MFr Bld: 5.9 % (ref 4.6–6.5)

## 2015-07-26 LAB — LIPID PANEL
CHOLESTEROL: 204 mg/dL — AB (ref 0–200)
HDL: 58.8 mg/dL (ref >39.00)
LDL Cholesterol: 133 mg/dL — ABNORMAL HIGH (ref 0–99)
NonHDL: 145.54
TRIGLYCERIDES: 64 mg/dL (ref 0.0–149.0)
Total CHOL/HDL Ratio: 3
VLDL: 12.8 mg/dL (ref 0.0–40.0)

## 2015-07-26 NOTE — Patient Instructions (Signed)

## 2015-07-26 NOTE — Progress Notes (Signed)
Pre visit review using our clinic review tool, if applicable. No additional management support is needed unless otherwise documented below in the visit note. 

## 2015-07-26 NOTE — Assessment & Plan Note (Signed)
Letter written to give pt extra time and breaks

## 2015-07-26 NOTE — Progress Notes (Signed)
Patient ID: Emily Maddox, female    DOB: 02-13-1971  Age: 44 y.o. MRN: [id number]    Subjective:  Subjective HPI ARRON MCNAUGHT presents for f/u fibromyalgia and diabetes.  She is taking the l sat and needs special accomodations because she can not sit or stand for long periods.  She needs to be able to get up and walk around and take breaks.     Review of Systems  Constitutional: Negative for diaphoresis, appetite change, fatigue and unexpected weight change.  Eyes: Negative for pain, redness and visual disturbance.  Respiratory: Negative for cough, chest tightness, shortness of breath and wheezing.   Cardiovascular: Negative for chest pain, palpitations and leg swelling.  Endocrine: Negative for cold intolerance, heat intolerance, polydipsia, polyphagia and polyuria.  Genitourinary: Negative for dysuria, frequency and difficulty urinating.  Musculoskeletal: Positive for myalgias, back pain and arthralgias. Negative for gait problem.  Neurological: Negative for dizziness, light-headedness, numbness and headaches.    History Past Medical History  Diagnosis Date  . Diabetes mellitus   . Hyperlipidemia   . 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 Hypertension   . Tachycardia     She has past surgical history that includes Breast lumpectomy (2004).   Her family history includes Breast cancer in an other family member; Cancer (age of onset: 24) in her father; Cancer (age of onset: 18) in her mother; Colon cancer in an other family member; Diabetes in her father, mother, and sister; Heart disease in her father; Hyperlipidemia in her father and mother; Hypertension in her father and mother.She reports that she has been smoking Cigarettes.  She has a 19 pack-year smoking history. She has never used smokeless tobacco. She reports that she does not drink alcohol or use illicit drugs.  Current Outpatient Prescriptions on File Prior to Visit    Medication Sig Dispense Refill  . ACCU-CHEK FASTCLIX LANCETS MISC CHECK BLOOD SUGAR DAILY. Dx. E11.9 102 each 11  . glucose blood (ACCU-CHEK AVIVA PLUS) test strip Check blood sugar once daily. Dx:E11.9 100 each 5  . HYDROcodone-acetaminophen (NORCO) 10-325 MG per tablet Take 1 tablet by mouth every 6 (six) hours as needed. 60 tablet 0  . metoprolol succinate (TOPROL-XL) 50 MG 24 hr tablet take 1 tablet by mouth once daily with food 30 tablet 5  . omeprazole (PRILOSEC) 20 MG capsule Take 1 capsule (20 mg total) by mouth daily as needed (acid reflux). 90 capsule 3   No current facility-administered medications on file prior to visit.     Objective:  Objective Physical Exam  Constitutional: She is oriented to person, place, and time. She appears well-developed and well-nourished.  HENT:  Head: Normocephalic and atraumatic.  Eyes: Conjunctivae and EOM are normal.  Neck: Normal range of motion. Neck supple. No JVD present. Carotid bruit is not present. No thyromegaly present.  Cardiovascular: Normal rate, regular rhythm and normal heart sounds.   No murmur heard. Pulmonary/Chest: Effort normal and breath sounds normal. No respiratory distress. She has no wheezes. She has no rales. She exhibits no tenderness.  Musculoskeletal: She exhibits tenderness. She exhibits no edema.       Right hip: She exhibits decreased range of motion and tenderness.       Left hip: She exhibits decreased range of motion and tenderness.       Lumbar back: She exhibits decreased range of motion, tenderness and pain. She exhibits no deformity.  Neurological: She is alert and  oriented to person, place, and time. She has normal reflexes.  Psychiatric: She has a normal mood and affect.   BP 120/70 mmHg  Pulse 95  Temp(Src) 98.1 F (36.7 C) (Oral)  Ht $R'5\' 6"'BE$  (1.676 m)  Wt 173 lb (78.472 kg)  BMI 27.94 kg/m2  SpO2 98%  LMP 07/19/2015 Wt Readings from Last 3 Encounters:  07/26/15 173 lb (78.472 kg)  04/06/15 173  lb 6 oz (78.642 kg)  12/28/14 175 lb 9.6 oz (79.652 kg)     Lab Results  Component Value Date   WBC 7.0 10/09/2014   HGB 13.8 10/09/2014   HCT 41.6 10/09/2014   PLT 276.0 10/09/2014   GLUCOSE 89 07/26/2015   CHOL 204* 07/26/2015   TRIG 64.0 07/26/2015   HDL 58.80 07/26/2015   LDLDIRECT 143.7 11/12/2013   LDLCALC 133* 07/26/2015   ALT 7 07/26/2015   AST 15 07/26/2015   NA 138 07/26/2015   K 4.2 07/26/2015   CL 104 07/26/2015   CREATININE 0.86 07/26/2015   BUN 9 07/26/2015   CO2 26 07/26/2015   TSH 1.965 10/09/2014   HGBA1C 5.9 07/26/2015   MICROALBUR <0.7 07/26/2015    Mr Brain W Wo Contrast  08/14/2014   CLINICAL DATA:  Vision change. Paresthesias. Chronic generalized pain.  EXAM: MRI HEAD WITHOUT AND WITH CONTRAST  TECHNIQUE: Multiplanar, multiecho pulse sequences of the brain and surrounding structures were obtained without and with intravenous contrast.  CONTRAST:  68mL MULTIHANCE GADOBENATE DIMEGLUMINE 529 MG/ML IV SOLN  COMPARISON:  CT head 07/14/2013  FINDINGS: Ventricle size is normal. Cerebral volume is normal. Pituitary normal in size. Craniocervical junction normal.  Negative for acute infarct.  Negative for hemorrhage or mass.  3 or 4 very small hyperintensities in the cerebral white matter bilaterally. These are in the deep white matter. No periventricular white matter lesions are seen and the pattern is not likely related to multiple sclerosis.  Postcontrast imaging reveals normal enhancement. No enhancing mass lesion.  IMPRESSION: No acute abnormality.  Several scattered small white matter hyperintensities likely related to migraine headache or chronic microvascular ischemia.   Electronically Signed   By: Franchot Gallo M.D.   On: 08/14/2014 08:20     Assessment & Plan:  Plan I have discontinued Ms. Rupert ALPRAZolam, cyclobenzaprine, Milnacipran HCl, ezetimibe, pregabalin, amoxicillin-clavulanate, clarithromycin, fluconazole, and cefdinir. I am also having her  maintain her omeprazole, metoprolol succinate, ACCU-CHEK FASTCLIX LANCETS, glucose blood, and HYDROcodone-acetaminophen.  No orders of the defined types were placed in this encounter.    Problem List Items Addressed This Visit    Fibromyalgia    Letter written to give pt extra time and breaks       Other Visit Diagnoses    DM (diabetes mellitus) type II controlled peripheral vascular disorder ([place])    -  Primary    Relevant Orders    Comp Met (CMET) (Completed)    Hemoglobin A1c (Completed)    Microalbumin / creatinine urine ratio (Completed)    POCT urinalysis dipstick (Completed)    Hyperlipidemia        Relevant Orders    Lipid panel (Completed)       Follow-up: Return in about 6 months (around 01/24/2016) for hypertension, hyperlipidemia, diabetes II.  Garnet Koyanagi, DO

## 2015-07-26 NOTE — Assessment & Plan Note (Signed)
Check labs today.

## 2015-08-06 ENCOUNTER — Telehealth: Payer: Self-pay | Admitting: Family Medicine

## 2015-08-06 MED ORDER — HYDROCODONE-ACETAMINOPHEN 10-325 MG PO TABS: 1.0000 | ORAL_TABLET | Freq: Four times a day (QID) | ORAL | Status: DC | PRN

## 2015-08-06 NOTE — Telephone Encounter (Signed)
Refill x1 

## 2015-08-06 NOTE — Telephone Encounter (Signed)
VM left advising Rx ready pick for pick up.       KP

## 2015-08-06 NOTE — Telephone Encounter (Signed)
Last seen 07/26/15 and filled 07/08/15 #60 UDS 6/14/6 low risk   Please advise     KP

## 2015-08-06 NOTE — Telephone Encounter (Signed)
Caller name: Carney Corners    Relationship to patient: Self   Can be reached: [phone]  Pharmacy: [facility] [address], [place]  Reason for call: Pt called in requesting a refill on her HYDROcodone Rx.

## 2015-08-09 ENCOUNTER — Encounter: Payer: Medicare Other | Admitting: Family Medicine

## 2015-09-01 ENCOUNTER — Telehealth: Payer: Self-pay | Admitting: Family Medicine

## 2015-09-01 DIAGNOSIS — M545 Low back pain: Secondary | ICD-10-CM

## 2015-09-01 NOTE — Telephone Encounter (Signed)
Last OV: 07/26/15 Reason for referral:  Chronic lower back pain that radiates to both legs; requesting a consultation with Dr. Jola Baptist Please advise.

## 2015-09-01 NOTE — Telephone Encounter (Signed)
Pt calling for referral to Rolla Flatten, MD   [facility] She states she saw him before for back pain and now having radiating pain into both legs.  His office told her she needs a referral. Pt ph # [phone]

## 2015-09-02 NOTE — Telephone Encounter (Signed)
She just wants a consultation to talk about the discogram. She needs a referral to do so at [place].   Please advise     KP

## 2015-09-02 NOTE — Telephone Encounter (Signed)
Ok to refer.

## 2015-09-02 NOTE — Telephone Encounter (Signed)
Usually neurosurgeon or ortho order that.  We don't order radiology to do injections normally.  Who ordered it last time she went?

## 2015-09-06 ENCOUNTER — Other Ambulatory Visit: Payer: Self-pay | Admitting: Family Medicine

## 2015-09-06 DIAGNOSIS — G8929 Other chronic pain: Secondary | ICD-10-CM

## 2015-09-06 DIAGNOSIS — M545 Low back pain, unspecified: Secondary | ICD-10-CM

## 2015-09-07 ENCOUNTER — Telehealth: Payer: Self-pay | Admitting: Family Medicine

## 2015-09-07 MED ORDER — HYDROCODONE-ACETAMINOPHEN 10-325 MG PO TABS: 1.0000 | ORAL_TABLET | Freq: Four times a day (QID) | ORAL | Status: DC | PRN

## 2015-09-07 NOTE — Telephone Encounter (Signed)
Patient aware Rx ready for pick up.      KP 

## 2015-09-07 NOTE — Telephone Encounter (Signed)
Pt calling for refill on hydrocodone. She has enough until Thursday. Best # to call when ready is [phone].

## 2015-09-07 NOTE — Telephone Encounter (Signed)
Last seen 07/26/15 and filled 08/06/15 #60 UDS 04/06/15 low risk   Please advise     KP

## 2015-09-07 NOTE — Telephone Encounter (Signed)
Refill x1 

## 2015-09-10 ENCOUNTER — Ambulatory Visit (INDEPENDENT_AMBULATORY_CARE_PROVIDER_SITE_OTHER): Payer: Medicare Other | Admitting: Sports Medicine

## 2015-09-10 ENCOUNTER — Encounter: Payer: Self-pay | Admitting: Sports Medicine

## 2015-09-10 DIAGNOSIS — M5126 Other intervertebral disc displacement, lumbar region: Secondary | ICD-10-CM | POA: Diagnosis not present

## 2015-09-10 DIAGNOSIS — M5136 Other intervertebral disc degeneration, lumbar region: Secondary | ICD-10-CM

## 2015-09-10 DIAGNOSIS — M51369 Other intervertebral disc degeneration, lumbar region without mention of lumbar back pain or lower extremity pain: Secondary | ICD-10-CM

## 2015-09-10 MED ORDER — MELOXICAM 15 MG PO TABS: ORAL_TABLET | ORAL | Status: DC

## 2015-09-10 MED ORDER — AMITRIPTYLINE HCL 50 MG PO TABS: ORAL_TABLET | ORAL | Status: DC
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
## 2015-09-10 NOTE — Assessment & Plan Note (Signed)
Emily Maddox likely has myofascial pain syndrome, she does have some disc protrusions that these are unlikely to cause her widespread bilateral aching shoulder, hip, back, thigh pain. I'm going to obtain an MRI, as her anatomy is likely changed since her previous one, to evaluate for a possible target for intervention, however we are going to start meloxicam, amitriptyline low dose in a slow up taper, and discontinue her hydrocodone. I have explained to her the pathophysiology and natural history of myofascial pain syndrome and fibromyalgia as well as degenerative disc disease. Polymyalgia rheumatica can also present in similar fashion so we will get some blood work. Return to see me to go over MRI results.

## 2015-09-11 LAB — COMPREHENSIVE METABOLIC PANEL WITH GFR
AST: 14 U/L (ref 10–30)
Albumin: 4 g/dL (ref 3.6–5.1)
BUN: 11 mg/dL (ref 7–25)
CO2: 25 mmol/L (ref 20–31)
Chloride: 105 mmol/L (ref 98–110)
Creat: 0.84 mg/dL (ref 0.50–1.10)
Sodium: 137 mmol/L (ref 135–146)
Total Bilirubin: 0.3 mg/dL (ref 0.2–1.2)
Total Protein: 6.6 g/dL (ref 6.1–8.1)

## 2015-09-11 LAB — COMPREHENSIVE METABOLIC PANEL
ALT: 5 U/L — ABNORMAL LOW (ref 6–29)
Alkaline Phosphatase: 51 U/L (ref 33–115)
Calcium: 8.7 mg/dL (ref 8.6–10.2)
Glucose, Bld: 82 mg/dL (ref 65–99)
Potassium: 5 mmol/L (ref 3.5–5.3)

## 2015-09-11 LAB — CBC
HCT: 41.1 % (ref 36.0–46.0)
Hemoglobin: 13.5 g/dL (ref 12.0–15.0)
MCH: 32.2 pg (ref 26.0–34.0)
MCHC: 32.8 g/dL (ref 30.0–36.0)
MCV: 98.1 fL (ref 78.0–100.0)
MPV: 9.3 fL (ref 8.6–12.4)
Platelets: 289 K/uL (ref 150–400)
RBC: 4.19 MIL/uL (ref 3.87–5.11)
RDW: 13.1 % (ref 11.5–15.5)
WBC: 6.7 K/uL (ref 4.0–10.5)

## 2015-09-11 LAB — TSH: TSH: 0.822 u[IU]/mL (ref 0.350–4.500)

## 2015-09-11 LAB — SEDIMENTATION RATE: Sed Rate: 11 mm/hr (ref 0–20)

## 2015-09-13 ENCOUNTER — Ambulatory Visit (INDEPENDENT_AMBULATORY_CARE_PROVIDER_SITE_OTHER): Payer: Medicare Other

## 2015-09-13 DIAGNOSIS — M5126 Other intervertebral disc displacement, lumbar region: Secondary | ICD-10-CM | POA: Diagnosis not present

## 2015-09-13 DIAGNOSIS — M5136 Other intervertebral disc degeneration, lumbar region: Secondary | ICD-10-CM

## 2015-09-13 DIAGNOSIS — M4806 Spinal stenosis, lumbar region: Secondary | ICD-10-CM

## 2015-09-15 ENCOUNTER — Encounter: Payer: Self-pay | Admitting: Sports Medicine

## 2015-09-15 ENCOUNTER — Ambulatory Visit (INDEPENDENT_AMBULATORY_CARE_PROVIDER_SITE_OTHER): Payer: Medicare Other | Admitting: Sports Medicine

## 2015-09-15 VITALS — BP 117/79 | HR 91 | Wt 174.0 lb

## 2015-09-15 DIAGNOSIS — M5126 Other intervertebral disc displacement, lumbar region: Secondary | ICD-10-CM | POA: Diagnosis not present

## 2015-09-15 DIAGNOSIS — M797 Fibromyalgia: Secondary | ICD-10-CM

## 2015-09-15 DIAGNOSIS — M5136 Other intervertebral disc degeneration, lumbar region: Secondary | ICD-10-CM

## 2015-09-15 DIAGNOSIS — M51369 Other intervertebral disc degeneration, lumbar region without mention of lumbar back pain or lower extremity pain: Secondary | ICD-10-CM

## 2015-09-15 MED ORDER — AMITRIPTYLINE HCL 50 MG PO TABS: ORAL_TABLET | ORAL | Status: DC

## 2015-09-15 NOTE — Progress Notes (Signed)
  Subjective:    CC: Follow-up  HPI: Myofascial pain syndrome: Has been through multiple neuropathic agents, we added amitriptyline at the last visit and she has noted a small improvement in her pain, has only been doing the medication for 2 days now. We also obtained an MRI, the results of which be dictated below but she denies any overt radicular symptoms. Pain is widespread in the shoulders, hips, back, thighs.  Past medical history, Surgical history, Family history not pertinant except as noted below, Social history, Allergies, and medications have been entered into the medical record, reviewed, and no changes needed.   Review of Systems: No fevers, chills, night sweats, weight loss, chest pain, or shortness of breath.   Objective:    General: Well Developed, well nourished, and in no acute distress.  Neuro: Alert and oriented x3, extra-ocular muscles intact, sensation grossly intact.  HEENT: Normocephalic, atraumatic, pupils equal round reactive to light, neck supple, no masses, no lymphadenopathy, thyroid nonpalpable.  Skin: Warm and dry, no rashes. Cardiac: Regular rate and rhythm, no murmurs rubs or gallops, no lower extremity edema.  Respiratory: Clear to auscultation bilaterally. Not using accessory muscles, speaking in full sentences.  MRI reviewed, there is a tiny broad-based protrusion at the L4-L5 level that does not appear to contact the exiting L4 nerve roots. Rest of the spine is intact, without any significant facet arthritis.  Impression and Recommendations:

## 2015-09-15 NOTE — Assessment & Plan Note (Signed)
There is a tiny protrusion at the L4-L5 level with does not contact the nerve root. Because of this pain is likely related to myofascial pain syndrome, she did have a good response to low-dose amitriptyline so we will continue a steady taper in the hopes of blocking all of her symptoms. Continue meloxicam, amitriptyline. I would want to see her back on a monthly basis and adjust medications to get her the best possible response.

## 2015-09-15 NOTE — Assessment & Plan Note (Signed)
There is a tiny protrusion at the L4-L5 level with does not contact the nerve root, I don't think this is responsible for her pain as she doesn't have anything overtly radicular, and there is no significant facet arthritis that could serve as a target for intervention. Because of this pain is likely related to myofascial pain syndrome, she did have a good response to low-dose amitriptyline so we will continue a steady up taper in the hopes of blocking all of her symptoms. Continue meloxicam, amitriptyline. I would want to see her back on a monthly basis and adjust medications to get her the best possible response.

## 2015-10-06 ENCOUNTER — Ambulatory Visit (INDEPENDENT_AMBULATORY_CARE_PROVIDER_SITE_OTHER): Payer: Medicare Other | Admitting: Sports Medicine

## 2015-10-06 ENCOUNTER — Encounter: Payer: Self-pay | Admitting: Sports Medicine

## 2015-10-06 VITALS — BP 121/90 | HR 86 | Temp 98.0°F | Resp 18 | Wt 175.4 lb

## 2015-10-06 DIAGNOSIS — F321 Major depressive disorder, single episode, moderate: Secondary | ICD-10-CM

## 2015-10-06 DIAGNOSIS — D259 Leiomyoma of uterus, unspecified: Secondary | ICD-10-CM | POA: Diagnosis not present

## 2015-10-06 DIAGNOSIS — E785 Hyperlipidemia, unspecified: Secondary | ICD-10-CM | POA: Diagnosis not present

## 2015-10-06 DIAGNOSIS — M797 Fibromyalgia: Secondary | ICD-10-CM

## 2015-10-06 MED ORDER — LEVOMILNACIPRAN HCL ER 20 & 40 MG PO C4PK: EXTENDED_RELEASE_CAPSULE | ORAL | Status: DC

## 2015-10-06 MED ORDER — DESVENLAFAXINE SUCCINATE ER 25 MG PO TB24: 1.0000 | ORAL_TABLET | Freq: Every day | ORAL | Status: DC

## 2015-10-06 NOTE — Progress Notes (Signed)
  Subjective:    CC: follow-up  HPI: This is a pleasant 44 year old female with difficult to treat fibromyalgia, she has tried multiple oral medications, unfortunately without any improvement. More recently we had some efficacy with amitriptyline however it is making her gain some weight as expected, and she would like to discontinue. On further questioning she does endorse severe difficulty sleeping, poor energy, moderate depressed mood, difficulty concentrating, mild anhedonia, denies suicidal or homicidal ideation.  Past medical history, Surgical history, Family history not pertinant except as noted below, Social history, Allergies, and medications have been entered into the medical record, reviewed, and no changes needed.   Review of Systems: No fevers, chills, night sweats, weight loss, chest pain, or shortness of breath.   Objective:    General: Well Developed, well nourished, and in no acute distress.  Neuro: Alert and oriented x3, extra-ocular muscles intact, sensation grossly intact.  HEENT: Normocephalic, atraumatic, pupils equal round reactive to light, neck supple, no masses, no lymphadenopathy, thyroid nonpalpable.  Skin: Warm and dry, no rashes. Cardiac: Regular rate and rhythm, no murmurs rubs or gallops, no lower extremity edema.  Respiratory: Clear to auscultation bilaterally. Not using accessory muscles, speaking in full sentences.  Impression and Recommendations:    I spent 25 minutes with this patient, greater than 50% was face-to-face time counseling regarding the above diagnoses

## 2015-10-06 NOTE — Assessment & Plan Note (Addendum)
Starting [facility], if unable to get approved she will do Pristiq. PHQ9 at each visit.

## 2015-10-06 NOTE — Assessment & Plan Note (Signed)
Still has the tiny protrusion at the L4-L5 level that does not appear to contact any nerve roots. amitriptyline worked initially but she does not like how it makes her feel now, as well as the weight gain. She has already failed gabapentin, Lyrica, Cymbalta, Flexeril. We are going to try Fetzima, if unable to get approved we will do Pristiq, coupon given for Pristiq. Also adding formal physical therapy as movement has been shown to be efficacious in fibromyalgia. Return in one month.

## 2015-10-11 ENCOUNTER — Telehealth: Payer: Self-pay | Admitting: Family Medicine

## 2015-10-11 ENCOUNTER — Encounter: Payer: Self-pay | Admitting: Rehabilitative and Restorative Service Providers"

## 2015-10-11 ENCOUNTER — Ambulatory Visit (INDEPENDENT_AMBULATORY_CARE_PROVIDER_SITE_OTHER): Payer: Medicare Other | Admitting: Rehabilitative and Restorative Service Providers"

## 2015-10-11 DIAGNOSIS — M623 Immobility syndrome (paraplegic): Secondary | ICD-10-CM | POA: Diagnosis not present

## 2015-10-11 DIAGNOSIS — R52 Pain, unspecified: Secondary | ICD-10-CM | POA: Diagnosis present

## 2015-10-11 DIAGNOSIS — M2560 Stiffness of unspecified joint, not elsewhere classified: Secondary | ICD-10-CM

## 2015-10-11 DIAGNOSIS — R293 Abnormal posture: Secondary | ICD-10-CM | POA: Diagnosis not present

## 2015-10-11 DIAGNOSIS — Z7409 Other reduced mobility: Secondary | ICD-10-CM | POA: Diagnosis not present

## 2015-10-11 MED ORDER — HYDROCODONE-ACETAMINOPHEN 10-325 MG PO TABS
1.0000 | ORAL_TABLET | Freq: Four times a day (QID) | ORAL | Status: DC | PRN
Start: 2015-10-11 — End: 2015-11-09

## 2015-10-11 NOTE — Telephone Encounter (Signed)
VM left advising Rx ready for pick up.     KP 
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
## 2015-10-11 NOTE — Telephone Encounter (Signed)
Last seen 07/26/15 and filled 09/07/15 #60 UDS 04/06/15 low risk  Please advise     KP

## 2015-10-11 NOTE — Telephone Encounter (Signed)
Caller name: Roman   Relationship to patient: Self   Can be reached: [phone]   Reason for call: pt is requesting a refill on her HYDROcodone Rx.

## 2015-10-11 NOTE — Telephone Encounter (Signed)
Refill x1 

## 2015-10-11 NOTE — Patient Instructions (Signed)
Stretch out strap - check on line   KNEE: Quadriceps - Prone    Place strap around ankle. Bring ankle toward buttocks. Press hip into surface. Hold _30__ seconds. _3__ reps per set, _2__ sets per day   HIP: Hamstrings - Supine    Place strap around foot. Raise leg up, keep knee straight. Hold _30__ seconds. _3__ reps per set, __2_ sets per day   Shoulder Blade Squeeze    Rotate shoulders back, then squeeze shoulder blades down and back  Hold 10 sec Repeat _10___ times. Do _several ___ sessions per day.

## 2015-10-13 ENCOUNTER — Ambulatory Visit: Payer: Medicare Other | Admitting: Sports Medicine

## 2015-10-15 ENCOUNTER — Encounter: Payer: Self-pay | Admitting: Rehabilitative and Restorative Service Providers"

## 2015-10-15 ENCOUNTER — Ambulatory Visit (INDEPENDENT_AMBULATORY_CARE_PROVIDER_SITE_OTHER): Payer: Medicare Other | Admitting: Rehabilitative and Restorative Service Providers"

## 2015-10-15 DIAGNOSIS — M623 Immobility syndrome (paraplegic): Secondary | ICD-10-CM | POA: Diagnosis not present

## 2015-10-15 DIAGNOSIS — M2560 Stiffness of unspecified joint, not elsewhere classified: Secondary | ICD-10-CM

## 2015-10-15 DIAGNOSIS — R293 Abnormal posture: Secondary | ICD-10-CM | POA: Diagnosis not present

## 2015-10-15 DIAGNOSIS — R52 Pain, unspecified: Secondary | ICD-10-CM

## 2015-10-15 DIAGNOSIS — Z7409 Other reduced mobility: Secondary | ICD-10-CM | POA: Diagnosis not present

## 2015-10-15 NOTE — Patient Instructions (Signed)
Abdominal Bracing With Pelvic Floor (Hook-Lying)    With neutral spine, tighten pelvic floor and abdominals sucking belly button to back bone, tighten muscles in back at waist. Hold 10 sec  Repeat _10__ times. Do _several__ times a day. Progress to do this exercise in sitting; standing; walking and with functional activities   Outer Hip Stretch: Reclined IT Band Stretch (Strap)    Strap around opposite foot, pull across only as far as possible with shoulders on mat. Hold for _30 sec Repeat __2-3__ times each leg. 1-2 times/day    Extensors, Sitting / Standing    Stand or sit, head in comfortable, centered position. Gently tuck chin and bring toward chest. Hold _10__ seconds. Repeat _3-5__ times per session. Do _2-3__ sessions per day.   Side Bend, Sitting    Sit, head in comfortable, centered position, chin slightly tucked. Gently tilt head, bringing ear toward same-side shoulder. Hold _10__ seconds.  Repeat _3-5__ times per session. Do _3-4__ sessions per day.   Flexors, Supine    Lie on back, head on small, rolled towel. Tip chin down. Tighten muscles in back of throat. Hold _10__ seconds. Repeat _5-10__ times per session. Do _2-3__ sessions per day.  Scapula Adduction With Pectoralis Stretch: Low - Standing   Shoulders at 45 hands even with shoulders, keeping weight through legs, shift weight forward until you feel pull or stretch through the front of your chest. Hold _30__ seconds. Do _3__ times, _2-4__ times per day.   Scapula Adduction With Pectoralis Stretch: Mid-Range - Standing   Shoulders at 90 elbows even with shoulders, keeping weight through legs, shift weight forward until you feel pull or strength through the front of your chest. Hold __30_ seconds. Do _3__ times, __2-4_ times per day.   Scapula Adduction With Pectoralis Stretch: High - Standing   Shoulders at 120 hands up high on the doorway, keeping weight on feet, shift weight forward  until you feel pull or stretch through the front of your chest. Hold _30__ seconds. Do _3__ times, _2-3__ times per day.
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
## 2015-11-03 ENCOUNTER — Ambulatory Visit: Payer: Medicare Other | Admitting: Sports Medicine

## 2015-11-09 ENCOUNTER — Telehealth: Payer: Self-pay | Admitting: Family Medicine

## 2015-11-09 MED ORDER — HYDROCODONE-ACETAMINOPHEN 10-325 MG PO TABS: 1.0000 | ORAL_TABLET | Freq: Four times a day (QID) | ORAL | Status: DC | PRN

## 2015-11-09 NOTE — Telephone Encounter (Signed)
Patient aware Rx ready for pick up.      KP 

## 2015-11-09 NOTE — Telephone Encounter (Signed)
Last seen 07/26/15 and filled 10/11/15 #60 UDS 04/06/15 low risk   Please advise     KP

## 2015-11-09 NOTE — Telephone Encounter (Signed)
Refill x1 

## 2015-11-09 NOTE — Telephone Encounter (Signed)
Pt needing refill on hydrocodone. Has 2 days left. Takes 2/day. Advised to call 3-5 days ahead. Call when RX ready [phone].

## 2015-11-11 ENCOUNTER — Ambulatory Visit (INDEPENDENT_AMBULATORY_CARE_PROVIDER_SITE_OTHER): Payer: Medicare Other

## 2015-11-11 VITALS — BP 120/86 | HR 82 | Ht 66.0 in | Wt 177.8 lb

## 2015-11-11 DIAGNOSIS — Z0000 Encounter for general adult medical examination without abnormal findings: Secondary | ICD-10-CM | POA: Diagnosis not present

## 2015-11-11 NOTE — Patient Instructions (Addendum)
Continue to eat heart healthy diet (full of fruits, vegetables, whole grains, lean protein, water--limit salt, fat, and sugar intake) and increase physical activity as tolerated.  Schedule eye and dental exam.    Continue to provide good foot care.    Follow up with Dr. Etter Sjogren as scheduled.    Discuss with Dr. Etter Sjogren options for smoking cessation.  Diabetes and Foot Care Diabetes may cause you to have problems because of poor blood supply (circulation) to your feet and legs. This may cause the skin on your feet to become thinner, break easier, and heal more slowly. Your skin may become dry, and the skin may peel and crack. You may also have nerve damage in your legs and feet causing decreased feeling in them. You may not notice minor injuries to your feet that could lead to infections or more serious problems. Taking care of your feet is one of the most important things you can do for yourself.  HOME CARE INSTRUCTIONS  Wear shoes at all times, even in the house. Do not go barefoot. Bare feet are easily injured.  Check your feet daily for blisters, cuts, and redness. If you cannot see the bottom of your feet, use a mirror or ask someone for help.  Wash your feet with warm water (do not use hot water) and mild soap. Then pat your feet and the areas between your toes until they are completely dry. Do not soak your feet as this can dry your skin.  Apply a moisturizing lotion or petroleum jelly (that does not contain alcohol and is unscented) to the skin on your feet and to dry, brittle toenails. Do not apply lotion between your toes.  Trim your toenails straight across. Do not dig under them or around the cuticle. File the edges of your nails with an emery board or nail file.  Do not cut corns or calluses or try to remove them with medicine.  Wear clean socks or stockings every day. Make sure they are not too tight. Do not wear knee-high stockings since they may decrease blood flow to your  legs.  Wear shoes that fit properly and have enough cushioning. To break in new shoes, wear them for just a few hours a day. This prevents you from injuring your feet. Always look in your shoes before you put them on to be sure there are no objects inside.  Do not cross your legs. This may decrease the blood flow to your feet.  If you find a minor scrape, cut, or break in the skin on your feet, keep it and the skin around it clean and dry. These areas may be cleansed with mild soap and water. Do not cleanse the area with peroxide, alcohol, or iodine.  When you remove an adhesive bandage, be sure not to damage the skin around it.  If you have a wound, look at it several times a day to make sure it is healing.  Do not use heating pads or hot water bottles. They may burn your skin. If you have lost feeling in your feet or legs, you may not know it is happening until it is too late.  Make sure your health care provider performs a complete foot exam at least annually or more often if you have foot problems. Report any cuts, sores, or bruises to your health care provider immediately. SEEK MEDICAL CARE IF:   You have an injury that is not healing.  You have cuts or breaks in  the skin.  You have an ingrown nail.  You notice redness on your legs or feet.  You feel burning or tingling in your legs or feet.  You have pain or cramps in your legs and feet.  Your legs or feet are numb.  Your feet always feel cold. SEEK IMMEDIATE MEDICAL CARE IF:   There is increasing redness, swelling, or pain in or around a wound.  There is a red line that goes up your leg.  Pus is coming from a wound.  You develop a fever or as directed by your health care provider.  You notice a bad smell coming from an ulcer or wound.   This information is not intended to replace advice given to you by your health care provider. Make sure you discuss any questions you have with your health care provider.    Document Released: 10/06/2000 Document Revised: 06/11/2013 Document Reviewed: 03/18/2013 Elsevier Interactive Patient Education 2016 [place] in the Home  Falls can cause injuries. They can happen to people of all ages. There are many things you can do to make your home safe and to help prevent falls.  WHAT CAN I DO ON THE OUTSIDE OF MY HOME?  Regularly fix the edges of walkways and driveways and fix any cracks.  Remove anything that might make you trip as you walk through a door, such as a raised step or threshold.  Trim any bushes or trees on the path to your home.  Use bright outdoor lighting.  Clear any walking paths of anything that might make someone trip, such as rocks or tools.  Regularly check to see if handrails are loose or broken. Make sure that both sides of any steps have handrails.  Any raised decks and porches should have guardrails on the edges.  Have any leaves, snow, or ice cleared regularly.  Use sand or salt on walking paths during winter.  Clean up any spills in your garage right away. This includes oil or grease spills. WHAT CAN I DO IN THE BATHROOM?   Use night lights.  Install grab bars by the toilet and in the tub and shower. Do not use towel bars as grab bars.  Use non-skid mats or decals in the tub or shower.  If you need to sit down in the shower, use a plastic, non-slip stool.  Keep the floor dry. Clean up any water that spills on the floor as soon as it happens.  Remove soap buildup in the tub or shower regularly.  Attach bath mats securely with double-sided non-slip rug tape.  Do not have throw rugs and other things on the floor that can make you trip. WHAT CAN I DO IN THE BEDROOM?  Use night lights.  Make sure that you have a light by your bed that is easy to reach.  Do not use any sheets or blankets that are too big for your bed. They should not hang down onto the floor.  Have a firm chair that has side  arms. You can use this for support while you get dressed.  Do not have throw rugs and other things on the floor that can make you trip. WHAT CAN I DO IN THE KITCHEN?  Clean up any spills right away.  Avoid walking on wet floors.  Keep items that you use a lot in easy-to-reach places.  If you need to reach something above you, use a strong step stool that has a grab bar.  Keep electrical cords out of the way.  Do not use floor polish or wax that makes floors slippery. If you must use wax, use non-skid floor wax.  Do not have throw rugs and other things on the floor that can make you trip. WHAT CAN I DO WITH MY STAIRS?  Do not leave any items on the stairs.  Make sure that there are handrails on both sides of the stairs and use them. Fix handrails that are broken or loose. Make sure that handrails are as long as the stairways.  Check any carpeting to make sure that it is firmly attached to the stairs. Fix any carpet that is loose or worn.  Avoid having throw rugs at the top or bottom of the stairs. If you do have throw rugs, attach them to the floor with carpet tape.  Make sure that you have a light switch at the top of the stairs and the bottom of the stairs. If you do not have them, ask someone to add them for you. WHAT ELSE CAN I DO TO HELP PREVENT FALLS?  Wear shoes that:  Do not have high heels.  Have rubber bottoms.  Are comfortable and fit you well.  Are closed at the toe. Do not wear sandals.  If you use a stepladder:  Make sure that it is fully opened. Do not climb a closed stepladder.  Make sure that both sides of the stepladder are locked into place.  Ask someone to hold it for you, if possible.  Clearly mark and make sure that you can see:  Any grab bars or handrails.  First and last steps.  Where the edge of each step is.  Use tools that help you move around (mobility aids) if they are needed. These  include:  Canes.  Walkers.  Scooters.  Crutches.  Turn on the lights when you go into a dark area. Replace any light bulbs as soon as they burn out.  Set up your furniture so you have a clear path. Avoid moving your furniture around.  If any of your floors are uneven, fix them.  If there are any pets around you, be aware of where they are.  Review your medicines with your doctor. Some medicines can make you feel dizzy. This can increase your chance of falling. Ask your doctor what other things that you can do to help prevent falls.   This information is not intended to replace advice given to you by your health care provider. Make sure you discuss any questions you have with your health care provider.   Document Released: 08/05/2009 Document Revised: 02/23/2015 Document Reviewed: 11/13/2014 Elsevier Interactive Patient Education 2016 [employer].  Steps to Quit Smoking  Smoking tobacco can be harmful to your health and can affect almost every organ in your body. Smoking puts you, and those around you, at risk for developing many serious chronic diseases. Quitting smoking is difficult, but it is one of the best things that you can do for your health. It is never too late to quit. WHAT ARE THE BENEFITS OF QUITTING SMOKING? When you quit smoking, you lower your risk of developing serious diseases and conditions, such as:  Lung cancer or lung disease, such as COPD.  Heart disease.  Stroke.  Heart attack.  Infertility.  Osteoporosis and bone fractures. Additionally, symptoms such as coughing, wheezing, and shortness of breath may get better when you quit. You may also find that you get sick less often because your  body is stronger at fighting off colds and infections. If you are pregnant, quitting smoking can help to reduce your chances of having a baby of low birth weight. HOW DO I GET READY TO QUIT? When you decide to quit smoking, create a plan to make sure that you are  successful. Before you quit:  Pick a date to quit. Set a date within the next two weeks to give you time to prepare.  Write down the reasons why you are quitting. Keep this list in places where you will see it often, such as on your bathroom mirror or in your car or wallet.  Identify the people, places, things, and activities that make you want to smoke (triggers) and avoid them. Make sure to take these actions:  Throw away all cigarettes at home, at work, and in your car.  Throw away smoking accessories, such as Scientist, research (medical).  Clean your car and make sure to empty the ashtray.  Clean your home, including curtains and carpets.  Tell your family, friends, and coworkers that you are quitting. Support from your loved ones can make quitting easier.  Talk with your health care provider about your options for quitting smoking.  Find out what treatment options are covered by your health insurance. WHAT STRATEGIES CAN I USE TO QUIT SMOKING?  Talk with your healthcare provider about different strategies to quit smoking. Some strategies include:  Quitting smoking altogether instead of gradually lessening how much you smoke over a period of time. Research shows that quitting "cold [place]" is more successful than gradually quitting.  Attending in-person counseling to help you build problem-solving skills. You are more likely to have success in quitting if you attend several counseling sessions. Even short sessions of 10 minutes can be effective.  Finding resources and support systems that can help you to quit smoking and remain smoke-free after you quit. These resources are most helpful when you use them often. They can include:  Online chats with a Social worker.  Telephone quitlines.  Printed Furniture conservator/restorer.  Support groups or group counseling.  Text messaging programs.  Mobile phone applications.  Taking medicines to help you quit smoking. (If you are pregnant or  breastfeeding, talk with your health care provider first.) Some medicines contain nicotine and some do not. Both types of medicines help with cravings, but the medicines that include nicotine help to relieve withdrawal symptoms. Your health care provider may recommend:  Nicotine patches, gum, or lozenges.  Nicotine inhalers or sprays.  Non-nicotine medicine that is taken by mouth. Talk with your health care provider about combining strategies, such as taking medicines while you are also receiving in-person counseling. Using these two strategies together makes you more likely to succeed in quitting than if you used either strategy on its own. If you are pregnant or breastfeeding, talk with your health care provider about finding counseling or other support strategies to quit smoking. Do not take medicine to help you quit smoking unless told to do so by your health care provider. WHAT THINGS CAN I DO TO MAKE IT EASIER TO QUIT? Quitting smoking might feel overwhelming at first, but there is a lot that you can do to make it easier. Take these important actions:  Reach out to your family and friends and ask that they support and encourage you during this time. Call telephone quitlines, reach out to support groups, or work with a counselor for support.  Ask people who smoke to avoid smoking around you.  Avoid places that trigger you to smoke, such as bars, parties, or smoke-break areas at work.  Spend time around people who do not smoke.  Lessen stress in your life, because stress can be a smoking trigger for some people. To lessen stress, try:  Exercising regularly.  Deep-breathing exercises.  Yoga.  Meditating.  Performing a body scan. This involves closing your eyes, scanning your body from head to toe, and noticing which parts of your body are particularly tense. Purposefully relax the muscles in those areas.  Download or purchase mobile phone or tablet apps (applications) that can help  you stick to your quit plan by providing reminders, tips, and encouragement. There are many free apps, such as QuitGuide from the [employer] Office manager for Disease Control and Prevention). You can find other support for quitting smoking (smoking cessation) through smokefree.gov and other websites. HOW WILL I FEEL WHEN I QUIT SMOKING? Within the first 24 hours of quitting smoking, you may start to feel some withdrawal symptoms. These symptoms are usually most noticeable 2-3 days after quitting, but they usually do not last beyond 2-3 weeks. Changes or symptoms that you might experience include:  Mood swings.  Restlessness, anxiety, or irritation.  Difficulty concentrating.  Dizziness.  Strong cravings for sugary foods in addition to nicotine.  Mild weight gain.  Constipation.  Nausea.  Coughing or a sore throat.  Changes in how your medicines work in your body.  A depressed mood.  Difficulty sleeping (insomnia). After the first 2-3 weeks of quitting, you may start to notice more positive results, such as:  Improved sense of smell and taste.  Decreased coughing and sore throat.  Slower heart rate.  Lower blood pressure.  Clearer skin.  The ability to breathe more easily.  Fewer sick days. Quitting smoking is very challenging for most people. Do not get discouraged if you are not successful the first time. Some people need to make many attempts to quit before they achieve long-term success. Do your best to stick to your quit plan, and talk with your health care provider if you have any questions or concerns.   This information is not intended to replace advice given to you by your health care provider. Make sure you discuss any questions you have with your health care provider.   Document Released: 10/03/2001 Document Revised: 02/23/2015 Document Reviewed: 02/23/2015 Elsevier Interactive Patient Education 2016 [place] Can Quit Smoking If you are ready to quit smoking  or are thinking about it, congratulations! You have chosen to help yourself be healthier and live longer! There are lots of different ways to quit smoking. Nicotine gum, nicotine patches, a nicotine inhaler, or nicotine nasal spray can help with physical craving. Hypnosis, support groups, and medicines help break the habit of smoking. TIPS TO GET OFF AND STAY OFF CIGARETTES  Learn to predict your moods. Do not let a bad situation be your excuse to have a cigarette. Some situations in your life might tempt you to have a cigarette.  Ask friends and co-workers not to smoke around you.  Make your home smoke-free.  Never have "just one" cigarette. It leads to wanting another and another. Remind yourself of your decision to quit.  On a card, make a list of your reasons for not smoking. Read it at least the same number of times a day as you have a cigarette. Tell yourself everyday, "I do not want to smoke. I choose not to smoke."  Ask someone at home or  work to help you with your plan to quit smoking.  Have something planned after you eat or have a cup of coffee. Take a walk or get other exercise to perk you up. This will help to keep you from overeating.  Try a relaxation exercise to calm you down and decrease your stress. Remember, you may be tense and nervous the first two weeks after you quit. This will pass.  Find new activities to keep your hands busy. Play with a pen, coin, or rubber band. Doodle or draw things on paper.  Brush your teeth right after eating. This will help cut down the craving for the taste of tobacco after meals. You can try mouthwash too.  Try gum, breath mints, or diet candy to keep something in your mouth. IF YOU SMOKE AND WANT TO QUIT:  Do not stock up on cigarettes. Never buy a carton. Wait until one pack is finished before you buy another.  Never carry cigarettes with you at work or at home.  Keep cigarettes as far away from you as possible. Leave them with  someone else.  Never carry matches or a lighter with you.  Ask yourself, "Do I need this cigarette or is this just a reflex?"  Bet with someone that you can quit. Put cigarette money in a piggy bank every morning. If you smoke, you give up the money. If you do not smoke, by the end of the week, you keep the money.  Keep trying. It takes 21 days to change a habit!  Talk to your doctor about using medicines to help you quit. These include nicotine replacement gum, lozenges, or skin patches.   This information is not intended to replace advice given to you by your health care provider. Make sure you discuss any questions you have with your health care provider.   Document Released: 08/05/2009 Document Revised: 01/01/2012 Document Reviewed: 08/05/2009 Elsevier Interactive Patient Education [employer].

## 2015-11-11 NOTE — Progress Notes (Signed)
Pre visit review using our clinic review tool, if applicable. No additional management support is needed unless otherwise documented below in the visit note. 

## 2015-11-11 NOTE — Progress Notes (Addendum)
Subjective:   QUILLA KEICHER is a 45 y.o. female who presents for an Initial Medicare Annual Wellness Visit.  Review of Systems:  No ROS Cardiac Risk Factors include: Other (see comment);smoking/ tobacco exposure (hyperlipidemia ) Sleep patterns:  Doesn't sleep well due to pain.   Home Safety/Smoke Alarms: Feels safe at home. Lives with father in one level home.  Smoke Alarms present.   Firearm Safety:  No firearms.   Seat Belt Safety/Bike Helmet:  Always wears seat belt.    Counseling:   Eye Exam- 1 year ago. Plans to schedule an appt.   Dental-  Plans to scheduled an appt.   Female:  Pap-  05/2015-normal--[facility]     Mammo-Plans to scheudle   [facility]-2011-polyps Declined Flu and Pneumonia vaccines.   Foot exam completed during this visit.    Objective:    Today's Vitals   11/11/15 1304  BP: 120/86  Pulse: 82  Height: 5\' 6"  (1.676 m)  Weight: 177 lb 12.8 oz (80.65 kg)  SpO2: 99%  PainSc: 5     Current Medications (verified) Outpatient Encounter Prescriptions as of 11/11/2015  Medication Sig  . ACCU-CHEK FASTCLIX LANCETS MISC CHECK BLOOD SUGAR DAILY. Dx. E11.9  . glucose blood (ACCU-CHEK AVIVA PLUS) test strip Check blood sugar once daily. Dx:E11.9  . HYDROcodone-acetaminophen (NORCO) 10-325 MG tablet Take 1 tablet by mouth every 6 (six) hours as needed.  . metoprolol succinate (TOPROL-XL) 50 MG 24 hr tablet take 1 tablet by mouth once daily with food  . omeprazole (PRILOSEC) 20 MG capsule Take 1 capsule (20 mg total) by mouth daily as needed (acid reflux).  . Probiotic Product (PROBIOTIC DAILY PO) Take 1 capsule by mouth daily.  Marland Kitchen Desvenlafaxine Succinate ER (PRISTIQ) 25 MG TB24 Take 1 tablet by mouth daily. (Patient not taking: Reported on 11/11/2015)  . Levomilnacipran HCl ER (FETZIMA TITRATION) 20 & 40 MG C4PK One tab PO daily as directed. (Patient not taking: Reported on 11/11/2015)  . meloxicam (MOBIC) 15 MG tablet One tab PO qAM with breakfast for 2  weeks, then daily prn pain. (Patient not taking: Reported on 11/11/2015)   No facility-administered encounter medications on file as of 11/11/2015.    Allergies (verified) Ultram   History: Past Medical History  Diagnosis Date  . Diabetes mellitus   . Hyperlipidemia   . 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 Hypertension   . Tachycardia    Past Surgical History  Procedure Laterality Date  . Breast lumpectomy  2004    cyst-Left Breast   Family History  Problem Relation Age of Onset  . Breast cancer    . Colon cancer    . Cancer Mother 58    breast  . Hypertension Mother   . Hyperlipidemia Mother   . Diabetes Mother   . Cancer Father 8    colon  . Hypertension Father   . Hyperlipidemia Father   . Heart disease Father     a fib  . Diabetes Father   . Diabetes Sister    Social History   Occupational History  . [employer]--mailroom  p/t    Social History Main Topics  . Smoking status: Current Some Day Smoker -- 1.00 packs/day for 19 years    Types: Cigarettes  . Smokeless tobacco: Never Used     Comment: couldn't tolerate chantix, cut down to 0.5 pack since 11/2013  . Alcohol Use: No  . Drug Use:  No  . Sexual Activity:    Partners: Female    Tobacco Counseling Ready to quit: No Counseling given: Yes   Activities of Daily Living In your present state of health, do you have any difficulty performing the following activities: 11/11/2015 07/26/2015  Hearing? N N  Vision? Y N  Difficulty concentrating or making decisions? N N  Walking or climbing stairs? N Y  Dressing or bathing? N Y  Doing errands, shopping? N Y  Conservation officer, nature and eating ? N -  Using the Toilet? Y -  In the past six months, have you accidently leaked urine? N -  Do you have problems with loss of bowel control? N -  Managing your Medications? N -  Managing your Finances? N -  Housekeeping or managing your Housekeeping? N -    Immunizations and  Health Maintenance Immunization History  Administered Date(s) Administered  . Hep A / Hep B 02/03/2009, 04/05/2009, 09/03/2009, 10/31/2010  . Td 02/03/2009   Health Maintenance Due  Topic Date Due  . OPHTHALMOLOGY EXAM  05/22/2015    Patient Care Team: Rosalita Chessman, DO as PCP - General Silverio Decamp, MD as Consulting Physician (Sports Medicine)  Indicate any recent Medical Services you may have received from other than [facility] providers in the past year (date may be approximate).     Assessment:   This is a routine wellness examination for Yesika.   Hearing/Vision screen  Hearing Screening   125Hz  250Hz  500Hz  1000Hz  2000Hz  4000Hz  8000Hz   Right ear:   Pass Pass Pass Pass   Left ear:   Pass Pass Pass Pass   Vision Screening Comments: Last Eye Exam:  1 year ago-no retinopathy     Dietary issues and exercise activities discussed: Current Exercise Habits:: Home exercise routine, Type of exercise: stretching;Other - see comments (bike), Time (Minutes): 40, Frequency (Times/Week): 4, Weekly Exercise (Minutes/Week): 160   Diet: Regular diet.  Relatively healthy.    Goals    . Increase muscle mass through weight training.       Home exercises.      . Quit smoking / using tobacco      Depression Screen PHQ 2/9 Scores 11/11/2015  PHQ - 2 Score 0    Fall Risk Fall Risk  11/11/2015  Falls in the past year? Yes  Number falls in past yr: 2 or more  Injury with Fall? No  Risk Factor Category  High Fall Risk  Risk for fall due to : Impaired balance/gait;History of fall(s)  Risk for fall due to (comments): occasional leg weakness and numbness  Follow up Education provided;Falls prevention discussed    Cognitive Function: AD8: 0/8.    Screening Tests Health Maintenance  Topic Date Due  . OPHTHALMOLOGY EXAM  05/22/2015  . PNEUMOCOCCAL POLYSACCHARIDE VACCINE (1) 04/04/2016 (Originally 08/21/1973)  . INFLUENZA VACCINE  10/05/2016 (Originally 05/24/2015)  . HEMOGLOBIN  A1C  01/24/2016  . URINE MICROALBUMIN  07/25/2016  . FOOT EXAM  11/10/2016  . PAP SMEAR  07/27/2017  . TETANUS/TDAP  02/04/2019  . HIV Screening  Completed      Plan:  Continue to eat heart healthy diet (full of fruits, vegetables, whole grains, lean protein, water--limit salt, fat, and sugar intake) and increase physical activity as tolerated.  Schedule eye and dental exam.    Continue to provide good foot care.    Follow up with Dr. Etter Sjogren as scheduled.    Discuss with Dr. Etter Sjogren options for smoking cessation.  During the course of the visit, Adaora was educated and counseled about the following appropriate screening and preventive services:   Vaccines to include Pneumoccal, Influenza, Hepatitis B, Td, Zostavax, HCV  Electrocardiogram  Cardiovascular disease screening  Colorectal cancer screening  Bone density screening  Diabetes screening  Glaucoma screening  Mammography/PAP  Nutrition counseling  Smoking cessation counseling  Patient Instructions (the written plan) were given to the patient.  Rudene Anda, RN   11/12/2015    Reviewed and agree---  Amado Coe DO

## 2015-11-25 ENCOUNTER — Other Ambulatory Visit: Payer: Self-pay | Admitting: Family Medicine

## 2015-11-29 ENCOUNTER — Other Ambulatory Visit: Payer: Self-pay | Admitting: Family Medicine

## 2015-11-29 DIAGNOSIS — Z1231 Encounter for screening mammogram for malignant neoplasm of breast: Secondary | ICD-10-CM

## 2015-12-07 DIAGNOSIS — K5900 Constipation, unspecified: Secondary | ICD-10-CM | POA: Diagnosis not present

## 2015-12-07 DIAGNOSIS — Z1211 Encounter for screening for malignant neoplasm of colon: Secondary | ICD-10-CM | POA: Diagnosis not present

## 2015-12-07 DIAGNOSIS — Z86010 Personal history of colonic polyps: Secondary | ICD-10-CM | POA: Diagnosis not present

## 2015-12-09 ENCOUNTER — Telehealth: Payer: Self-pay | Admitting: Family Medicine

## 2015-12-09 MED ORDER — HYDROCODONE-ACETAMINOPHEN 10-325 MG PO TABS
1.0000 | ORAL_TABLET | Freq: Four times a day (QID) | ORAL | Status: DC | PRN
Start: 2015-12-09 — End: 2016-01-06

## 2015-12-09 NOTE — Telephone Encounter (Signed)
Refill x1 

## 2015-12-09 NOTE — Telephone Encounter (Signed)
Caller name: Self  Can be reached: [phone]   Reason for call: HYDROcodone-acetaminophen ([place]) 10-325 MG tablet [id number]

## 2015-12-09 NOTE — Telephone Encounter (Signed)
Last seen 07/26/15 and filled 11/09/15 #60 UDS 04/06/15 low risk   Please advise    KP

## 2015-12-09 NOTE — Telephone Encounter (Signed)
VM left advising Rx ready for pick up.     KP 

## 2015-12-10 MED FILL — HYDROCODON-APAP 10-325: 10-325 | 15 days supply | Qty: 60 | Fill #0

## 2015-12-21 ENCOUNTER — Ambulatory Visit
Admission: RE | Admit: 2015-12-21 | Discharge: 2015-12-21 | Disposition: A | Discharge status: Home / Self Care | Payer: Medicare Other | Source: Ambulatory Visit | Attending: Family Medicine | Admitting: Family Medicine

## 2015-12-21 DIAGNOSIS — Z1231 Encounter for screening mammogram for malignant neoplasm of breast: Secondary | ICD-10-CM | POA: Diagnosis not present

## 2016-01-03 DIAGNOSIS — Z800 Family history of malignant neoplasm of digestive organs: Secondary | ICD-10-CM | POA: Diagnosis not present

## 2016-01-03 DIAGNOSIS — K635 Polyp of colon: Secondary | ICD-10-CM | POA: Diagnosis not present

## 2016-01-03 DIAGNOSIS — K6389 Other specified diseases of intestine: Secondary | ICD-10-CM | POA: Diagnosis not present

## 2016-01-03 DIAGNOSIS — Z1211 Encounter for screening for malignant neoplasm of colon: Secondary | ICD-10-CM | POA: Diagnosis not present

## 2016-01-03 DIAGNOSIS — D125 Benign neoplasm of sigmoid colon: Secondary | ICD-10-CM | POA: Diagnosis not present

## 2016-01-03 LAB — HM COLONOSCOPY

## 2016-01-06 ENCOUNTER — Telehealth: Payer: Self-pay | Admitting: Family Medicine

## 2016-01-06 MED ORDER — HYDROCODONE-ACETAMINOPHEN 10-325 MG PO TABS: 1.0000 | ORAL_TABLET | Freq: Four times a day (QID) | ORAL | Status: DC | PRN

## 2016-01-06 NOTE — Telephone Encounter (Signed)
Refill x1 

## 2016-01-06 NOTE — Telephone Encounter (Signed)
Reason for call: pt called for refill on hydrocodone. She has a couple days left. She takes 2/day. Please call when RX ready to pick up at [phone].

## 2016-01-06 NOTE — Telephone Encounter (Signed)
Last seen 07/26/15 and filled 12/09/15 #60 UDS 04/06/15 low risk   Please advise    KP

## 2016-01-06 NOTE — Telephone Encounter (Signed)
Patient aware Rx is ready for pick up.      KP 

## 2016-01-07 MED FILL — HYDROCODON-APAP 10-325: 10-325 | 15 days supply | Qty: 60 | Fill #0

## 2016-01-18 ENCOUNTER — Ambulatory Visit (INDEPENDENT_AMBULATORY_CARE_PROVIDER_SITE_OTHER): Payer: Medicare Other | Admitting: Family Medicine

## 2016-01-18 ENCOUNTER — Encounter: Payer: Self-pay | Admitting: Family Medicine

## 2016-01-18 VITALS — BP 120/70 | HR 94 | Temp 98.1°F | Ht 66.0 in | Wt 176.0 lb

## 2016-01-18 DIAGNOSIS — E785 Hyperlipidemia, unspecified: Secondary | ICD-10-CM

## 2016-01-18 DIAGNOSIS — E1151 Type 2 diabetes mellitus with diabetic peripheral angiopathy without gangrene: Secondary | ICD-10-CM | POA: Diagnosis not present

## 2016-01-18 DIAGNOSIS — E538 Deficiency of other specified B group vitamins: Secondary | ICD-10-CM | POA: Diagnosis not present

## 2016-01-18 DIAGNOSIS — M797 Fibromyalgia: Secondary | ICD-10-CM

## 2016-01-18 DIAGNOSIS — E559 Vitamin D deficiency, unspecified: Secondary | ICD-10-CM

## 2016-01-18 LAB — POCT URINALYSIS DIPSTICK
BILIRUBIN UA: NEGATIVE
GLUCOSE UA: NEGATIVE
KETONES UA: NEGATIVE
Leukocytes, UA: NEGATIVE
NITRITE UA: NEGATIVE
Protein, UA: NEGATIVE
RBC UA: NEGATIVE
Spec Grav, UA: 1.015
Urobilinogen, UA: 0.2
pH, UA: 6

## 2016-01-18 NOTE — Patient Instructions (Signed)

## 2016-01-18 NOTE — Progress Notes (Signed)
Pre visit review using our clinic review tool, if applicable. No additional management support is needed unless otherwise documented below in the visit note. 

## 2016-01-18 NOTE — Progress Notes (Signed)
Patient ID: Emily Maddox, female    DOB: 23-Jan-1971  Age: 45 y.o. MRN: [id number]    Subjective:  Subjective HPI Emily Maddox presents for labs and to have disability form filled out.    Review of Systems  Constitutional: Negative for diaphoresis, appetite change, fatigue and unexpected weight change.  Eyes: Negative for pain, redness and visual disturbance.  Respiratory: Negative for cough, chest tightness, shortness of breath and wheezing.   Cardiovascular: Negative for chest pain, palpitations and leg swelling.  Endocrine: Negative for cold intolerance, heat intolerance, polydipsia, polyphagia and polyuria.  Genitourinary: Negative for dysuria, frequency and difficulty urinating.  Musculoskeletal: Positive for myalgias, back pain and arthralgias.  Neurological: Negative for dizziness, light-headedness, numbness and headaches.    History Past Medical History  Diagnosis Date  . Diabetes mellitus   . Hyperlipidemia   . 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 Hypertension   . Tachycardia     She has past surgical history that includes Breast lumpectomy (2004).   Her family history includes Cancer (age of onset: 33) in her father; Cancer (age of onset: 58) in her mother; Diabetes in her father, mother, and sister; Heart disease in her father; Hyperlipidemia in her father and mother; Hypertension in her father and mother.She reports that she has been smoking Cigarettes.  She has a 19 pack-year smoking history. She has never used smokeless tobacco. She reports that she does not drink alcohol or use illicit drugs.  Current Outpatient Prescriptions on File Prior to Visit  Medication Sig Dispense Refill  . ACCU-CHEK FASTCLIX LANCETS MISC CHECK BLOOD SUGAR DAILY. Dx. E11.9 102 each 11  . glucose blood (ACCU-CHEK AVIVA PLUS) test strip Check blood sugar once daily. Dx:E11.9 100 each 5  . omeprazole (PRILOSEC) 20 MG capsule Take 1  capsule (20 mg total) by mouth daily as needed (acid reflux). 90 capsule 3  . Probiotic Product (PROBIOTIC DAILY PO) Take 1 capsule by mouth daily.     No current facility-administered medications on file prior to visit.     Objective:  Objective Physical Exam  Constitutional: She is oriented to person, place, and time. She appears well-developed and well-nourished.  HENT:  Head: Normocephalic and atraumatic.  Eyes: Conjunctivae and EOM are normal.  Neck: Normal range of motion. Neck supple. No JVD present. Carotid bruit is not present. No thyromegaly present.  Cardiovascular: Normal rate, regular rhythm and normal heart sounds.   No murmur heard. Pulmonary/Chest: Effort normal and breath sounds normal. No respiratory distress. She has no wheezes. She has no rales. She exhibits no tenderness.  Musculoskeletal: She exhibits no edema.       Lumbar back: She exhibits decreased range of motion, tenderness and spasm.  Neurological: She is alert and oriented to person, place, and time.  Psychiatric: She has a normal mood and affect.  Nursing note and vitals reviewed.  BP 120/70 mmHg  Pulse 94  Temp(Src) 98.1 F (36.7 C) (Oral)  Ht 5\' 6"  (1.676 m)  Wt 176 lb (79.833 kg)  BMI 28.42 kg/m2  SpO2 99%  LMP 11/30/2015 Wt Readings from Last 3 Encounters:  01/18/16 176 lb (79.833 kg)  11/11/15 177 lb 12.8 oz (80.65 kg)  10/06/15 175 lb 6.4 oz (79.561 kg)     Lab Results  Component Value Date   WBC 6.7 09/10/2015   HGB 13.5 09/10/2015   HCT 41.1 09/10/2015   PLT 289 09/10/2015   GLUCOSE 90 01/18/2016  CHOL 205* 01/18/2016   TRIG 65.0 01/18/2016   HDL 59.00 01/18/2016   LDLDIRECT 143.7 11/12/2013   LDLCALC 133* 01/18/2016   ALT 6 01/18/2016   AST 13 01/18/2016   NA 136 01/18/2016   K 4.7 01/18/2016   CL 104 01/18/2016   CREATININE 0.82 01/18/2016   BUN 9 01/18/2016   CO2 27 01/18/2016   TSH 0.822 09/10/2015   HGBA1C 6.0 01/18/2016   MICROALBUR <0.7 07/26/2015    Mm  Screening Breast Tomo Bilateral  12/22/2015  CLINICAL DATA:  Screening. EXAM: DIGITAL SCREENING BILATERAL MAMMOGRAM WITH 3D TOMO WITH CAD COMPARISON:  Previous exam(s). ACR Breast Density Category b: There are scattered areas of fibroglandular density. FINDINGS: There are no findings suspicious for malignancy. Images were processed with CAD. IMPRESSION: No mammographic evidence of malignancy. A result letter of this screening mammogram will be mailed directly to the patient. RECOMMENDATION: Screening mammogram in one year. (Code:SM-B-01Y) BI-RADS CATEGORY  1: Negative. Electronically Signed   By: Franki Cabot M.D.   On: 12/22/2015 13:28     Assessment & Plan:  Plan I have discontinued Ms. Schoenfelder meloxicam, Levomilnacipran HCl ER, and Desvenlafaxine Succinate ER. I have also changed her metoprolol succinate. Additionally, I am having her maintain her omeprazole, ACCU-CHEK FASTCLIX LANCETS, glucose blood, Probiotic Product (PROBIOTIC DAILY PO), and HYDROcodone-acetaminophen.  Meds ordered this encounter  Medications  . metoprolol succinate (TOPROL-XL) 50 MG 24 hr tablet    Sig: Take 1 tablet (50 mg total) by mouth daily. Take with or immediately following a meal.    Dispense:  30 tablet    Refill:  11  . HYDROcodone-acetaminophen (NORCO) 10-325 MG tablet    Sig: Take 1 tablet by mouth every 6 (six) hours as needed.    Dispense:  60 tablet    Refill:  0    Problem List Items Addressed This Visit      Unprioritized   Hyperlipidemia - Primary   Relevant Medications   metoprolol succinate (TOPROL-XL) 50 MG 24 hr tablet   Other Relevant Orders   Lipid panel (Completed)   Comprehensive metabolic panel (Completed)   Fibromyalgia   Relevant Orders   Vitamin B12 (Completed)   Hemoglobin A1c (Completed)   Lipid panel (Completed)   POCT urinalysis dipstick (Completed)   Comprehensive metabolic panel (Completed)    Other Visit Diagnoses    Vitamin D deficiency disease        Relevant Orders     Vitamin B12 (Completed)    Hemoglobin A1c (Completed)    Lipid panel (Completed)    POCT urinalysis dipstick (Completed)    Vitamin D 1,25 dihydroxy (Completed)    Comprehensive metabolic panel (Completed)    Vitamin D deficiency        Relevant Orders    Vitamin B12 (Completed)    Hemoglobin A1c (Completed)    Lipid panel (Completed)    POCT urinalysis dipstick (Completed)    Comprehensive metabolic panel (Completed)    B12 deficiency        Relevant Orders    Vitamin B12 (Completed)    DM (diabetes mellitus) type II controlled peripheral vascular disorder (HCC)        Relevant Medications    metoprolol succinate (TOPROL-XL) 50 MG 24 hr tablet    Other Relevant Orders    Hemoglobin A1c (Completed)    Comprehensive metabolic panel (Completed)     her fmla/ disability paperwork was filled out Follow-up: Return if symptoms worsen or fail to improve.  [name], DO

## 2016-01-19 ENCOUNTER — Encounter: Payer: Self-pay | Admitting: Family Medicine

## 2016-01-19 LAB — COMPREHENSIVE METABOLIC PANEL
ALT: 6 U/L (ref 0–35)
AST: 13 U/L (ref 0–37)
Albumin: 4.1 g/dL (ref 3.5–5.2)
Alkaline Phosphatase: 43 U/L (ref 39–117)
BUN: 9 mg/dL (ref 6–23)
CHLORIDE: 104 meq/L (ref 96–112)
CO2: 27 meq/L (ref 19–32)
Calcium: 9.3 mg/dL (ref 8.4–10.5)
Creatinine, Ser: 0.82 mg/dL (ref 0.40–1.20)
GFR: 97.66 mL/min (ref >60.00)
GLUCOSE: 90 mg/dL (ref 70–99)
POTASSIUM: 4.7 meq/L (ref 3.5–5.1)
SODIUM: 136 meq/L (ref 135–145)
Total Bilirubin: 0.3 mg/dL (ref 0.2–1.2)
Total Protein: 7.3 g/dL (ref 6.0–8.3)

## 2016-01-19 LAB — LIPID PANEL
CHOLESTEROL: 205 mg/dL — AB (ref 0–200)
HDL: 59 mg/dL (ref >39.00)
LDL CALC: 133 mg/dL — AB (ref 0–99)
NONHDL: 145.82
Total CHOL/HDL Ratio: 3
Triglycerides: 65 mg/dL (ref 0.0–149.0)
VLDL: 13 mg/dL (ref 0.0–40.0)

## 2016-01-19 LAB — VITAMIN B12: Vitamin B-12: 264 pg/mL (ref 211–911)

## 2016-01-19 LAB — HEMOGLOBIN A1C: HEMOGLOBIN A1C: 6 % (ref 4.6–6.5)

## 2016-01-22 LAB — VITAMIN D 1,25 DIHYDROXY
VITAMIN D 1, 25 (OH) TOTAL: 42 pg/mL (ref 18–72)
VITAMIN D2 1, 25 (OH): 17 pg/mL
Vitamin D3 1, 25 (OH)2: 25 pg/mL

## 2016-01-23 MED ORDER — HYDROCODONE-ACETAMINOPHEN 10-325 MG PO TABS: 1.0000 | ORAL_TABLET | Freq: Four times a day (QID) | ORAL | Status: DC | PRN

## 2016-01-23 MED ORDER — METOPROLOL SUCCINATE ER 50 MG PO TB24: 50.0000 mg | ORAL_TABLET | Freq: Every day | ORAL | Status: DC

## 2016-01-25 ENCOUNTER — Ambulatory Visit: Payer: Medicare Other | Admitting: Family Medicine

## 2016-01-27 ENCOUNTER — Telehealth: Payer: Self-pay | Admitting: Family Medicine

## 2016-01-27 NOTE — Telephone Encounter (Signed)
Error

## 2016-01-28 ENCOUNTER — Ambulatory Visit (INDEPENDENT_AMBULATORY_CARE_PROVIDER_SITE_OTHER): Payer: Medicare Other | Admitting: *Deleted

## 2016-01-28 DIAGNOSIS — E538 Deficiency of other specified B group vitamins: Secondary | ICD-10-CM | POA: Diagnosis not present

## 2016-01-28 MED ORDER — CYANOCOBALAMIN 1000 MCG/ML IJ SOLN
1000.0000 ug | Freq: Once | INTRAMUSCULAR | Status: AC
  Administered 2016-01-28: 1000 ug via INTRAMUSCULAR

## 2016-01-28 MED ORDER — VITAMIN D (ERGOCALCIFEROL) 1.25 MG (50000 UNIT) PO CAPS: 50000.0000 [IU] | ORAL_CAPSULE | ORAL | Status: DC

## 2016-01-28 NOTE — Progress Notes (Signed)
Pre visit review using our clinic review tool, if applicable. No additional management support is needed unless otherwise documented below in the visit note.  Pt tolerated injection well.   Pt also requested to have refill for Hydrocodone-acetaminophen available for pickup at next nurse visit. Informed pt that per our records, this rx was given to her at 01/18/16 OV w/ fill date of 01/23/16. Pt does not recall receiving rx. Instructed her to look through her paperwork from that [facility] and call the office to let us know if she finds the rx or not.   Dorrene German, RN

## 2016-02-01 ENCOUNTER — Telehealth: Payer: Self-pay | Admitting: Family Medicine

## 2016-02-01 MED ORDER — HYDROCODONE-ACETAMINOPHEN 10-325 MG PO TABS: 1.0000 | ORAL_TABLET | Freq: Four times a day (QID) | ORAL | Status: DC | PRN

## 2016-02-01 NOTE — Telephone Encounter (Signed)
Called pharmacy and there was no record of hydrocodone being filled since January patient called in and states she never did get the presciption and that it was not in her paper work.

## 2016-02-01 NOTE — Telephone Encounter (Signed)
Ok to re print 

## 2016-02-01 NOTE — Telephone Encounter (Signed)
Refilled hydrocodone #60 and 0 refills

## 2016-02-01 NOTE — Telephone Encounter (Signed)
Pt states she was told to call back about RX for Hydrocodone. She found paperwork from when she was in the office but she does not have an RX with it. She said she can promise you she never got it. Pt ph# [phone]

## 2016-02-01 NOTE — Addendum Note (Signed)
Addended by: Ricky Ala on: 02/01/2016 05:17 PM   Modules accepted: Orders

## 2016-02-03 ENCOUNTER — Ambulatory Visit (INDEPENDENT_AMBULATORY_CARE_PROVIDER_SITE_OTHER): Payer: Medicare Other

## 2016-02-03 DIAGNOSIS — E538 Deficiency of other specified B group vitamins: Secondary | ICD-10-CM | POA: Diagnosis not present

## 2016-02-03 MED ORDER — CYANOCOBALAMIN 1000 MCG/ML IJ SOLN
1000.0000 ug | Freq: Once | INTRAMUSCULAR | Status: AC
  Administered 2016-02-03: 1000 ug via INTRAMUSCULAR

## 2016-02-03 MED FILL — HYDROCODON-APAP 10-325: 10-325 | 15 days supply | Qty: 60 | Fill #0

## 2016-02-03 NOTE — Progress Notes (Signed)
Pre visit review using our clinic tool,if applicable. No additional management support is needed unless otherwise documented below in the visit note.   Patient in for B 12 Injection. Given IM Right deltoid per order from Dr. Lizbeth Bark- [place], due to patient being  B12 deficient.  Per patient no changes in medications. No complaints voiced. Appointment scheduled for patient for 2 weeks from this date for recheck.

## 2016-02-11 ENCOUNTER — Ambulatory Visit: Payer: Medicare Other

## 2016-02-11 ENCOUNTER — Ambulatory Visit (INDEPENDENT_AMBULATORY_CARE_PROVIDER_SITE_OTHER): Payer: Medicare Other | Admitting: Behavioral Health

## 2016-02-11 DIAGNOSIS — E538 Deficiency of other specified B group vitamins: Secondary | ICD-10-CM

## 2016-02-11 MED ORDER — CYANOCOBALAMIN 1000 MCG/ML IJ SOLN
1000.0000 ug | Freq: Once | INTRAMUSCULAR | Status: AC
  Administered 2016-02-11: 1000 ug via INTRAMUSCULAR

## 2016-02-11 NOTE — Progress Notes (Signed)
Pre visit review using our clinic review tool, if applicable. No additional management support is needed unless otherwise documented below in the visit note.  Patient in office today for B12 injection. IM given in Left Deltoid. Patient tolerated injection well. Next appointment 02/18/16 at 2:15 PM.

## 2016-02-18 ENCOUNTER — Ambulatory Visit (INDEPENDENT_AMBULATORY_CARE_PROVIDER_SITE_OTHER): Payer: Medicare Other | Admitting: Behavioral Health

## 2016-02-18 DIAGNOSIS — E538 Deficiency of other specified B group vitamins: Secondary | ICD-10-CM | POA: Diagnosis not present

## 2016-02-18 MED ORDER — CYANOCOBALAMIN 1000 MCG/ML IJ SOLN
1000.0000 ug | Freq: Once | INTRAMUSCULAR | Status: AC
  Administered 2016-02-18: 1000 ug via INTRAMUSCULAR

## 2016-02-18 NOTE — Progress Notes (Signed)
Pre visit review using our clinic review tool, if applicable. No additional management support is needed unless otherwise documented below in the visit note.  Patient came in office for B12 injection. IM given in Left Deltoid. Patient tolerated injection well. Next appointment scheduled for 03/21/16 at 2:30 PM.

## 2016-03-06 ENCOUNTER — Telehealth: Payer: Self-pay | Admitting: Family Medicine

## 2016-03-06 NOTE — Telephone Encounter (Signed)
Refill x1 

## 2016-03-06 NOTE — Telephone Encounter (Signed)
Requesting Hydrocodone-acet. 10-325mg -Take 1 tablet by mouth every 6 hours as needed. Last refill:02/01/16;#60,0 Last OV:01/18/16 UDS:04/06/15-Low risk-Next screen:10/06/15 Please advise.//AB/CMA

## 2016-03-06 NOTE — Telephone Encounter (Signed)
Relation to [id number] Call back number:[phone]   Reason for call:  Patient requesting a refill HYDROcodone-acetaminophen (NORCO) 10-325 MG tablet

## 2016-03-07 ENCOUNTER — Other Ambulatory Visit: Payer: Self-pay

## 2016-03-07 MED ORDER — HYDROCODONE-ACETAMINOPHEN 10-325 MG PO TABS: 1.0000 | ORAL_TABLET | Freq: Four times a day (QID) | ORAL | Status: DC | PRN

## 2016-03-07 MED FILL — HYDROCODON-APAP 10-325: 10-325 | 15 days supply | Qty: 60 | Fill #0

## 2016-03-07 NOTE — Telephone Encounter (Deleted)
Rx printed and faxed to [facility] as requested.

## 2016-03-07 NOTE — Telephone Encounter (Signed)
Rx printed and taken to front desk. Left message for patient.

## 2016-03-21 ENCOUNTER — Ambulatory Visit (INDEPENDENT_AMBULATORY_CARE_PROVIDER_SITE_OTHER): Payer: Medicare Other | Admitting: Behavioral Health

## 2016-03-21 DIAGNOSIS — E538 Deficiency of other specified B group vitamins: Secondary | ICD-10-CM | POA: Diagnosis not present

## 2016-03-21 MED ORDER — CYANOCOBALAMIN 1000 MCG/ML IJ SOLN
1000.0000 ug | Freq: Once | INTRAMUSCULAR | Status: AC
  Administered 2016-03-21: 1000 ug via INTRAMUSCULAR

## 2016-03-21 NOTE — Progress Notes (Signed)
Pre visit review using our clinic review tool, if applicable. No additional management support is needed unless otherwise documented below in the visit note.  Patient in clinic today for B12 injection. IM given in Left Deltoid. Patient tolerated injection well. Next appointment scheduled for 04/21/16 at 2:00 PM.

## 2016-03-22 LAB — VITAMIN B12: Vitamin B-12: >1500 pg/mL — ABNORMAL HIGH (ref 211–911)

## 2016-03-23 NOTE — Progress Notes (Signed)
Patient returned call. B12 results given to patient and patient agrees to call for appointment in about 1 month. States she will determine whether or not she will continue based on next B12 level.

## 2016-04-05 ENCOUNTER — Telehealth: Payer: Self-pay | Admitting: Family Medicine

## 2016-04-05 NOTE — Telephone Encounter (Signed)
Medication refill for HYDROcodone   CB: [phone]

## 2016-04-05 NOTE — Telephone Encounter (Signed)
Refill x1 

## 2016-04-05 NOTE — Telephone Encounter (Signed)
Last OV: 01/18/16 Last filled: 03/07/16, #60, 0 RF Sig: Take 1 tablet by mouth every 6 (six) hours as needed. UDS: 04/06/15, low risk

## 2016-04-06 MED ORDER — HYDROCODONE-ACETAMINOPHEN 10-325 MG PO TABS: 1.0000 | ORAL_TABLET | Freq: Four times a day (QID) | ORAL | Status: DC | PRN

## 2016-04-06 MED FILL — HYDROCODON-APAP 10-325: 10-325 | 15 days supply | Qty: 60 | Fill #0

## 2016-04-06 NOTE — Telephone Encounter (Signed)
Rx printed, to PCP for signature. 

## 2016-04-06 NOTE — Telephone Encounter (Signed)
VM left advising Rx ready for pick up.     KP 

## 2016-04-21 ENCOUNTER — Ambulatory Visit: Payer: Medicare Other

## 2016-04-28 ENCOUNTER — Telehealth: Payer: Self-pay | Admitting: Family Medicine

## 2016-04-28 NOTE — Telephone Encounter (Signed)
Pt called in because she says that she will be going out of town on Wednesday for 2 months. Pt want to know if pcp is able to provide her with a double script because she will not be in town to come in and pick up.   Please advise.    CB: [phone]

## 2016-04-28 NOTE — Telephone Encounter (Signed)
Last seen 01/18/16 and filled 04/06/16 #60 UDS 04/06/15 low risk  Please advise    KP

## 2016-04-28 NOTE — Telephone Encounter (Signed)
...   Pt is requesting HYDROcodone-acetaminophen

## 2016-04-30 NOTE — Telephone Encounter (Signed)
Refill x1 

## 2016-05-01 MED ORDER — HYDROCODONE-ACETAMINOPHEN 10-325 MG PO TABS: 1.0000 | ORAL_TABLET | Freq: Four times a day (QID) | ORAL | Status: DC | PRN

## 2016-05-01 MED FILL — HYDROCODON-APAP 10-325: 10-325 | 15 days supply | Qty: 60 | Fill #0

## 2016-05-01 NOTE — Telephone Encounter (Signed)
We can post date a script and give her 2

## 2016-05-01 NOTE — Telephone Encounter (Signed)
July and August scripts printed and she is aware the medication is ready for pick up.      KP

## 2016-05-01 NOTE — Telephone Encounter (Signed)
She is going out of town for 2 months, do she give #60 or #120. she is requesting a 2 mo supply.   Please advise    KP

## 2016-05-30 ENCOUNTER — Telehealth: Payer: Self-pay

## 2016-05-30 NOTE — Telephone Encounter (Signed)
Patient returned my call. Asked per Dr. Etter Sjogren if she is currently taking Metoprolol 50 mg.states she only takes 25 mg because the 50 mg is too strong.

## 2016-06-01 ENCOUNTER — Telehealth: Payer: Self-pay | Admitting: Medical

## 2016-06-01 ENCOUNTER — Encounter: Payer: Self-pay | Admitting: Medical

## 2016-06-01 ENCOUNTER — Ambulatory Visit (INDEPENDENT_AMBULATORY_CARE_PROVIDER_SITE_OTHER): Payer: Medicare Other | Admitting: Medical

## 2016-06-01 ENCOUNTER — Other Ambulatory Visit (HOSPITAL_COMMUNITY)
Admission: RE | Admit: 2016-06-01 | Discharge: 2016-06-01 | Disposition: A | Discharge status: Home / Self Care | Payer: Medicare Other | Source: Ambulatory Visit | Attending: Medical | Admitting: Medical

## 2016-06-01 ENCOUNTER — Ambulatory Visit: Payer: Medicare Other | Admitting: Family Medicine

## 2016-06-01 VITALS — BP 120/74 | HR 88 | Temp 98.1°F | Ht 66.0 in | Wt 170.6 lb

## 2016-06-01 DIAGNOSIS — D259 Leiomyoma of uterus, unspecified: Secondary | ICD-10-CM

## 2016-06-01 DIAGNOSIS — R350 Frequency of micturition: Secondary | ICD-10-CM

## 2016-06-01 DIAGNOSIS — Z113 Encounter for screening for infections with a predominantly sexual mode of transmission: Secondary | ICD-10-CM | POA: Insufficient documentation

## 2016-06-01 DIAGNOSIS — N760 Acute vaginitis: Secondary | ICD-10-CM | POA: Insufficient documentation

## 2016-06-01 DIAGNOSIS — R1030 Lower abdominal pain, unspecified: Secondary | ICD-10-CM | POA: Diagnosis not present

## 2016-06-01 LAB — POC URINALSYSI DIPSTICK (AUTOMATED)
BILIRUBIN UA: NEGATIVE
GLUCOSE UA: NEGATIVE
KETONES UA: NEGATIVE
Leukocytes, UA: NEGATIVE
Nitrite, UA: NEGATIVE
Protein, UA: NEGATIVE
RBC UA: NEGATIVE
SPEC GRAV UA: 1.015
UROBILINOGEN UA: 0.2
pH, UA: 6

## 2016-06-01 LAB — POCT URINE PREGNANCY: PREG TEST UR: NEGATIVE

## 2016-06-01 LAB — CBC WITH DIFFERENTIAL/PLATELET
BASOS PCT: 1 % (ref 0.0–3.0)
Basophils Absolute: 0.1 10*3/uL (ref 0.0–0.1)
EOS ABS: 0.1 10*3/uL (ref 0.0–0.7)
EOS PCT: 1.1 % (ref 0.0–5.0)
HCT: 38.9 % (ref 36.0–46.0)
HEMOGLOBIN: 12.9 g/dL (ref 12.0–15.0)
LYMPHS ABS: 2.1 10*3/uL (ref 0.7–4.0)
Lymphocytes Relative: 22 % (ref 12.0–46.0)
MCHC: 33.2 g/dL (ref 30.0–36.0)
MCV: 98.1 fl (ref 78.0–100.0)
MONO ABS: 0.5 10*3/uL (ref 0.1–1.0)
Monocytes Relative: 5 % (ref 3.0–12.0)
NEUTROS ABS: 6.8 10*3/uL (ref 1.4–7.7)
Neutrophils Relative %: 70.9 % (ref 43.0–77.0)
PLATELETS: 298 10*3/uL (ref 150.0–400.0)
RBC: 3.96 Mil/uL (ref 3.87–5.11)
RDW: 13.8 % (ref 11.5–15.5)
WBC: 9.6 10*3/uL (ref 4.0–10.5)

## 2016-06-01 MED ORDER — SULFAMETHOXAZOLE-TRIMETHOPRIM 800-160 MG PO TABS: 1.0000 | ORAL_TABLET | Freq: Two times a day (BID) | ORAL | 0 refills | Status: DC

## 2016-06-01 NOTE — Telephone Encounter (Signed)
Will you try to get her US done by tomorrow. Thanks,

## 2016-06-01 NOTE — Telephone Encounter (Signed)
Patient scheduled with Imaging for Monday.

## 2016-06-01 NOTE — Telephone Encounter (Signed)
[place] tranvaginal order  added.

## 2016-06-01 NOTE — Progress Notes (Signed)
Pt has seen results on MyChart and message also sent for patient to call back if any questions.

## 2016-06-01 NOTE — Progress Notes (Signed)
Pre visit review using our clinic review tool, if applicable. No additional management support is needed unless otherwise documented below in the visit note./HSM  

## 2016-06-01 NOTE — Telephone Encounter (Signed)
Is there lab needs reslulting. I am trying to close not but can't?

## 2016-06-01 NOTE — Progress Notes (Signed)
Subjective:    Patient ID: [name], female    DOB: Jan 08, 1971, 45 y.o.   MRN: [id number]  HPI  Pt in for some pain that seemed to start over her suprapubic area and toward her back. Pain has been present for about 2.5 weeks. Pain will last for about 15 minutes and then subside.  Pt has history of fibroids. Her menses just finished(about 5 days ago) Menses now finished. Pt has pelvic US about 2 years ago.  Pt has some urgency to urinate. No fever, no chills no sweats, no nausea or vomiting. No vaginal dc.   Pt dad just passed away on 05-16-2016. Decreased appetite since then.   Colonscopy done in March history of polyps was ok.   Pt has back pain. History of some chronic pain.  During exam she had to urinate although had just given sample about 15 minutes earlier. Pt does not want to use cipro. Read some negative comments on cipro.  Review of Systems  Constitutional: Negative for chills, fatigue and fever.  Respiratory: Negative for cough, chest tightness, shortness of breath and wheezing.   Cardiovascular: Negative for chest pain and palpitations.  Gastrointestinal: Positive for abdominal pain. Negative for blood in stool, diarrhea, nausea and vomiting.       Suprpubic area pain. Hx of fibrods.    Past Medical History:  Diagnosis Date  . 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 Diabetes mellitus   . Hyperlipidemia   . Hypertension   . Tachycardia      Social History   Social History  . Marital status: Single    Spouse name: N/A  . Number of children: N/A  . Years of education: N/A   Occupational History  . [employer]--mailroom  p/t Unemployed   Social History Main Topics  . Smoking status: Current Some Day Smoker    Packs/day: 1.00    Years: 19.00    Types: Cigarettes  . Smokeless tobacco: Never Used     Comment: couldn't tolerate chantix, cut down to 0.5 pack since 11/2013  . Alcohol use No  . Drug use: No  .  Sexual activity: Yes    Partners: Female   Other Topics Concern  . Not on file   Social History Narrative   She lives with father.  No children.   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 in business administration   Exercise--  3-4 x a week    Past Surgical History:  Procedure Laterality Date  . BREAST LUMPECTOMY  2004   cyst-Left Breast    Family History  Problem Relation Age of Onset  . Breast cancer    . Colon cancer    . Cancer Mother 55    breast  . Hypertension Mother   . Hyperlipidemia Mother   . Diabetes Mother   . Cancer Father 21    colon  . Hypertension Father   . Hyperlipidemia Father   . Heart disease Father     a fib  . Diabetes Father   . Diabetes Sister     Allergies  Allergen Reactions  . Ultram [Tramadol] Palpitations    Current Outpatient Prescriptions on File Prior to Visit  Medication Sig Dispense Refill  . ACCU-CHEK FASTCLIX LANCETS MISC CHECK BLOOD SUGAR DAILY. Dx. E11.9 102 each 11  . glucose blood (ACCU-CHEK AVIVA PLUS) test strip Check blood sugar once daily.  Dx:E11.9 100 each 5  . HYDROcodone-acetaminophen (NORCO) 10-325 MG tablet Take 1 tablet by mouth every 6 (six) hours as needed. 60 tablet 0  . metoprolol succinate (TOPROL-XL) 50 MG 24 hr tablet Take 1 tablet (50 mg total) by mouth daily. Take with or immediately following a meal. 30 tablet 11  . omeprazole (PRILOSEC) 20 MG capsule Take 1 capsule (20 mg total) by mouth daily as needed (acid reflux). 90 capsule 3  . Probiotic Product (PROBIOTIC DAILY PO) Take 1 capsule by mouth daily.    . Vitamin D, Ergocalciferol, (DRISDOL) 50000 units CAPS capsule Take 1 capsule (50,000 Units total) by mouth every 7 (seven) days. 4 capsule 2   No current facility-administered medications on file prior to visit.     BP 120/74 (BP Location: Right Arm, Patient Position: Sitting, Cuff Size: Normal)   Pulse 88   Temp 98.1 F (36.7 C) (Oral)   Ht 5\' 6"  (1.676 m)   Wt 170  lb 9.6 oz (77.4 kg)   LMP 05/31/2016   SpO2 98%   BMI 27.54 kg/m       Objective:   Physical Exam   General Appearance- Not in acute distress.  HEENT Eyes- Scleraeral/Conjuntiva-bilat- Not Yellow. Mouth & Throat- Normal.  Chest and Lung Exam Auscultation: Breath sounds:-Normal. Adventitious sounds:- No Adventitious sounds.  Cardiovascular Auscultation:Rythm - Regular. Heart Sounds -Normal heart sounds.  Abdomen Inspection:-Inspection Normal.  Palpation/Perucssion: Palpation and Percussion of the abdomen reveal- suprapbic pressure. Otherwise non Tender, No Rebound tenderness, No rigidity(Guarding) and No Palpable abdominal masses.  Liver:-Normal.  Spleen:- Normal.   Rt lower quadrant- no heel jar.  Back- no cva tenderness.     Assessment & Plan:  For your frequent urination will rx bactrim ds short course pending result of urine studies.  Will get urine culture and ancillary studies.  Get cbc today.  Try to schedule for US pelvis to assess fibroid size.  Follow up in 4 days or as needed  Severe pain after hours or weekend ED evaluation.  Jaamal Farooqui, Percell Miller, PA-C

## 2016-06-01 NOTE — Patient Instructions (Addendum)
For your frequent urination will rx bactrim ds short course pending result of urine studies.  Will get urine culture and ancillary studies.  Get cbc today.  Try to schedule for US pelvis to assess fibroid size.  Follow up in 4 days or as needed  Severe pain after hours or weekend ED evaluation.

## 2016-06-02 LAB — URINE CYTOLOGY ANCILLARY ONLY
CHLAMYDIA, DNA PROBE: NEGATIVE
Neisseria Gonorrhea: NEGATIVE
Trichomonas: NEGATIVE

## 2016-06-02 LAB — URINE CULTURE: ORGANISM ID, BACTERIA: NO GROWTH

## 2016-06-02 NOTE — Addendum Note (Signed)
Addended by: Ewing Schlein on: 06/02/2016 05:39 PM   Modules accepted: Orders

## 2016-06-02 NOTE — Telephone Encounter (Signed)
Ok to change on med list

## 2016-06-05 ENCOUNTER — Ambulatory Visit (HOSPITAL_BASED_OUTPATIENT_CLINIC_OR_DEPARTMENT_OTHER)
Admission: RE | Admit: 2016-06-05 | Discharge: 2016-06-05 | Disposition: A | Discharge status: Home / Self Care | Payer: Medicare Other | Source: Ambulatory Visit | Attending: Medical | Admitting: Medical

## 2016-06-05 DIAGNOSIS — R1030 Lower abdominal pain, unspecified: Secondary | ICD-10-CM | POA: Diagnosis not present

## 2016-06-05 DIAGNOSIS — D259 Leiomyoma of uterus, unspecified: Secondary | ICD-10-CM | POA: Diagnosis not present

## 2016-06-05 NOTE — Telephone Encounter (Signed)
Referral placed for gyn

## 2016-06-06 ENCOUNTER — Telehealth: Payer: Self-pay | Admitting: Family Medicine

## 2016-06-06 LAB — URINE CYTOLOGY ANCILLARY ONLY
BACTERIAL VAGINITIS: POSITIVE — AB
CANDIDA VAGINITIS: NEGATIVE

## 2016-06-06 MED ORDER — METRONIDAZOLE 500 MG PO TABS: 500.0000 mg | ORAL_TABLET | Freq: Two times a day (BID) | ORAL | 0 refills | Status: DC

## 2016-06-06 NOTE — Telephone Encounter (Signed)
Pt says that she feels that she is having a lot of inflammation. Pt says that she is having a flare up and would like to have something called in to the pharmacy.       ALSO, pt has a referral for GYN she says that she currently go to [facility] at  [address]

## 2016-06-06 NOTE — Telephone Encounter (Signed)
mobic 15 mg 1 po qd #30  2 refills

## 2016-06-06 NOTE — Telephone Encounter (Signed)
Rx of flagyl sent to her pharmacy.

## 2016-06-06 NOTE — Telephone Encounter (Signed)
Spoke with patient and she is having Fibromyalgia flare up and she wanted something for the inflammation, she said she can not take flexeril or Celebrex, they have made the fatigue worst, but she is in pain and having a burning sensation from the hips down and in her shoulders.      KP

## 2016-06-06 NOTE — Telephone Encounter (Signed)
Referral placed in [facility] work que, awaiting appt

## 2016-06-07 MED ORDER — MELOXICAM 15 MG PO TABS: 15.0000 mg | ORAL_TABLET | Freq: Every day | ORAL | 2 refills | Status: DC

## 2016-06-07 NOTE — Addendum Note (Signed)
Addended by: Ewing Schlein on: 06/07/2016 09:13 AM   Modules accepted: Orders

## 2016-06-07 NOTE — Telephone Encounter (Signed)
Patient aware Rx has been sent      KP 

## 2016-06-08 DIAGNOSIS — D259 Leiomyoma of uterus, unspecified: Secondary | ICD-10-CM | POA: Diagnosis not present

## 2016-07-17 ENCOUNTER — Telehealth: Payer: Self-pay

## 2016-07-17 MED ORDER — HYDROCODONE-ACETAMINOPHEN 10-325 MG PO TABS: 1.0000 | ORAL_TABLET | Freq: Four times a day (QID) | ORAL | 0 refills | Status: DC | PRN

## 2016-07-17 NOTE — Telephone Encounter (Signed)
Ok to refill x 1  

## 2016-07-17 NOTE — Telephone Encounter (Signed)
Patient aware Rx ready for pick up.

## 2016-07-17 NOTE — Telephone Encounter (Signed)
Last seen with PCP on 01/18/16 and filled 06/01/16 #60 UDS 04/06/15 low risk   Please advise    KP

## 2016-07-21 MED FILL — HYDROCODON-APAP 10-325: 10-325 | 15 days supply | Qty: 60 | Fill #0

## 2016-08-22 DIAGNOSIS — H578 Other specified disorders of eye and adnexa: Secondary | ICD-10-CM | POA: Diagnosis not present

## 2016-08-22 DIAGNOSIS — S0501XA Injury of conjunctiva and corneal abrasion without foreign body, right eye, initial encounter: Secondary | ICD-10-CM | POA: Diagnosis not present

## 2016-08-23 ENCOUNTER — Other Ambulatory Visit: Payer: Self-pay | Admitting: Family Medicine

## 2016-08-23 DIAGNOSIS — H578 Other specified disorders of eye and adnexa: Secondary | ICD-10-CM | POA: Diagnosis not present

## 2016-08-23 DIAGNOSIS — S0501XA Injury of conjunctiva and corneal abrasion without foreign body, right eye, initial encounter: Secondary | ICD-10-CM | POA: Diagnosis not present

## 2016-08-23 NOTE — Telephone Encounter (Signed)
Self. Refill request for HYDROcodone.

## 2016-08-24 MED ORDER — HYDROCODONE-ACETAMINOPHEN 10-325 MG PO TABS: 1.0000 | ORAL_TABLET | Freq: Four times a day (QID) | ORAL | 0 refills | Status: DC | PRN

## 2016-08-24 NOTE — Telephone Encounter (Signed)
Pt is requesting refill on Hydrocodone.  Last OV: 01/18/2016 Last Fill: 07/17/2016 #60 and 0RF UDS: 04/06/2015 Low risk  Please advise.

## 2016-08-24 NOTE — Telephone Encounter (Signed)
Rx printed, to PCP for signature. 

## 2016-08-24 NOTE — Telephone Encounter (Signed)
Refill x1 

## 2016-08-24 NOTE — Telephone Encounter (Signed)
Rx printed and signed. Left voicemail on permitted number notifying pt rx is ready for pickup.

## 2016-08-25 DIAGNOSIS — S0501XA Injury of conjunctiva and corneal abrasion without foreign body, right eye, initial encounter: Secondary | ICD-10-CM | POA: Diagnosis not present

## 2016-08-25 DIAGNOSIS — H578 Other specified disorders of eye and adnexa: Secondary | ICD-10-CM | POA: Diagnosis not present

## 2016-08-25 MED FILL — HYDROCODON-APAP 10-325: 10-325 | 15 days supply | Qty: 60 | Fill #0

## 2016-08-29 ENCOUNTER — Ambulatory Visit (INDEPENDENT_AMBULATORY_CARE_PROVIDER_SITE_OTHER): Payer: Medicare Other | Admitting: Family Medicine

## 2016-08-29 ENCOUNTER — Encounter: Payer: Self-pay | Admitting: Family Medicine

## 2016-08-29 VITALS — BP 110/82 | HR 93 | Temp 98.8°F | Resp 16 | Ht 66.0 in | Wt 166.8 lb

## 2016-08-29 DIAGNOSIS — E1165 Type 2 diabetes mellitus with hyperglycemia: Secondary | ICD-10-CM

## 2016-08-29 DIAGNOSIS — Z79891 Long term (current) use of opiate analgesic: Secondary | ICD-10-CM | POA: Diagnosis not present

## 2016-08-29 DIAGNOSIS — E1139 Type 2 diabetes mellitus with other diabetic ophthalmic complication: Secondary | ICD-10-CM

## 2016-08-29 DIAGNOSIS — E785 Hyperlipidemia, unspecified: Secondary | ICD-10-CM

## 2016-08-29 DIAGNOSIS — I10 Essential (primary) hypertension: Secondary | ICD-10-CM | POA: Diagnosis not present

## 2016-08-29 LAB — POCT URINALYSIS DIPSTICK
Bilirubin, UA: NEGATIVE
Blood, UA: NEGATIVE
Glucose, UA: NEGATIVE
KETONES UA: NEGATIVE
Leukocytes, UA: NEGATIVE
Nitrite, UA: NEGATIVE
PH UA: 6
PROTEIN UA: NEGATIVE
SPEC GRAV UA: 1.02
Urobilinogen, UA: NEGATIVE

## 2016-08-29 LAB — COMPREHENSIVE METABOLIC PANEL
ALBUMIN: 4.2 g/dL (ref 3.5–5.2)
ALK PHOS: 42 U/L (ref 39–117)
ALT: 6 U/L (ref 0–35)
AST: 14 U/L (ref 0–37)
BILIRUBIN TOTAL: 0.4 mg/dL (ref 0.2–1.2)
BUN: 11 mg/dL (ref 6–23)
CO2: 27 mEq/L (ref 19–32)
CREATININE: 0.87 mg/dL (ref 0.40–1.20)
Calcium: 9.4 mg/dL (ref 8.4–10.5)
Chloride: 105 mEq/L (ref 96–112)
GFR: 90.96 mL/min (ref >60.00)
Glucose, Bld: 92 mg/dL (ref 70–99)
Potassium: 4.4 mEq/L (ref 3.5–5.1)
SODIUM: 137 meq/L (ref 135–145)
TOTAL PROTEIN: 7.4 g/dL (ref 6.0–8.3)

## 2016-08-29 LAB — LIPID PANEL
CHOLESTEROL: 213 mg/dL — AB (ref 0–200)
HDL: 64.5 mg/dL (ref >39.00)
LDL Cholesterol: 137 mg/dL — ABNORMAL HIGH (ref 0–99)
NonHDL: 148.33
Total CHOL/HDL Ratio: 3
Triglycerides: 58 mg/dL (ref 0.0–149.0)
VLDL: 11.6 mg/dL (ref 0.0–40.0)

## 2016-08-29 LAB — MICROALBUMIN / CREATININE URINE RATIO
CREATININE, U: 72.6 mg/dL
MICROALB/CREAT RATIO: 1 mg/g (ref 0.0–30.0)

## 2016-08-29 LAB — HEMOGLOBIN A1C: HEMOGLOBIN A1C: 6 % (ref 4.6–6.5)

## 2016-08-29 MED ORDER — ACCU-CHEK FASTCLIX LANCETS MISC: 11 refills | Status: DC

## 2016-08-29 NOTE — Progress Notes (Signed)
Pre visit review using our clinic review tool, if applicable. No additional management support is needed unless otherwise documented below in the visit note. 

## 2016-08-29 NOTE — Progress Notes (Signed)
Patient ID: Emily Maddox, female    DOB: [birth year]  Age: 45 y.o. MRN: [id number]    Subjective:  Subjective  HPI Emily Maddox presents for f/u dm and lipids.   Pt with no new complaints.  Thighs and low back still painful.  Pt has had full neuro and neurosurgical work up.  Nothing was found.  She has also been to pain management.    HPI HYPERTENSION   Blood pressure range-not checking   Chest pain- no      Dyspnea- no Lightheadedness- no   Edema- no  Other side effects - no   Medication compliance: good Low salt diet- yes    DIABETES    Blood Sugar ranges--- 80-160  Polyuria- no New Visual problems- no  Hypoglycemic symptoms- no  Other side effects-no Medication compliance - good Last eye exam- 07/2016 Foot exam- 2017   HYPERLIPIDEMIA  Medication compliance- good RUQ pain- no  Muscle aches- no Other side effects-no      Review of Systems  Constitutional: Positive for fatigue. Negative for activity change, appetite change and unexpected weight change.  Respiratory: Negative for cough and shortness of breath.   Cardiovascular: Negative for chest pain and palpitations.  Neurological: Positive for weakness and numbness.  Psychiatric/Behavioral: Negative for behavioral problems and dysphoric mood. The patient is not nervous/anxious.     History Past Medical History:  Diagnosis Date  . 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 Diabetes mellitus   . Hyperlipidemia   . Hypertension   . Tachycardia     She has a past surgical history that includes Breast lumpectomy (2004).   Her family history includes Cancer (age of onset: 58) in her father; Cancer (age of onset: 84) in her mother; Diabetes in her father, mother, and sister; Heart disease in her father; Hyperlipidemia in her father and mother; Hypertension in her father and mother.She reports that she has been smoking Cigarettes.  She has a 19.00 pack-year smoking  history. She has never used smokeless tobacco. She reports that she does not drink alcohol or use drugs.  Current Outpatient Prescriptions on File Prior to Visit  Medication Sig Dispense Refill  . glucose blood (ACCU-CHEK AVIVA PLUS) test strip Check blood sugar once daily. Dx:E11.9 100 each 5  . HYDROcodone-acetaminophen (NORCO) 10-325 MG tablet Take 1 tablet by mouth every 6 (six) hours as needed. 60 tablet 0  . metoprolol succinate (TOPROL-XL) 25 MG 24 hr tablet Take 25 mg by mouth daily.    Marland Kitchen omeprazole (PRILOSEC) 20 MG capsule Take 1 capsule (20 mg total) by mouth daily as needed (acid reflux). 90 capsule 3  . Probiotic Product (PROBIOTIC DAILY PO) Take 1 capsule by mouth daily.    . Vitamin D, Ergocalciferol, (DRISDOL) 50000 units CAPS capsule Take 1 capsule (50,000 Units total) by mouth every 7 (seven) days. 4 capsule 2  . meloxicam (MOBIC) 15 MG tablet Take 1 tablet (15 mg total) by mouth daily. (Patient not taking: Reported on 08/29/2016) 30 tablet 2   No current facility-administered medications on file prior to visit.      Objective:  Objective  Physical Exam  Constitutional: She is oriented to person, place, and time. She appears well-developed and well-nourished.  HENT:  Head: Normocephalic and atraumatic.  Eyes: Conjunctivae and EOM are normal.  Neck: Normal range of motion. Neck supple. No JVD present. Carotid bruit is not present. No thyromegaly present.  Cardiovascular: Normal rate, regular rhythm  and normal heart sounds.   No murmur heard. Pulmonary/Chest: Effort normal and breath sounds normal. No respiratory distress. She has no wheezes. She has no rales. She exhibits no tenderness.  Musculoskeletal: She exhibits no edema.  Neurological: She is alert and oriented to person, place, and time.  Psychiatric: She has a normal mood and affect. Her behavior is normal. Judgment and thought content normal.  Nursing note and vitals reviewed.  BP 110/82 (BP Location: Left Arm,  Patient Position: Sitting, Cuff Size: Normal)   Pulse 93   Temp 98.8 F (37.1 C) (Oral)   Resp 16   Ht 5\' 6"  (1.676 m)   Wt 166 lb 12.8 oz (75.7 kg)   LMP 08/10/2016 (Approximate)   SpO2 99%   BMI 26.92 kg/m  Wt Readings from Last 3 Encounters:  08/29/16 166 lb 12.8 oz (75.7 kg)  06/01/16 170 lb 9.6 oz (77.4 kg)  01/18/16 176 lb (79.8 kg)     Lab Results  Component Value Date   WBC 9.6 06/01/2016   HGB 12.9 06/01/2016   HCT 38.9 06/01/2016   PLT 298.0 06/01/2016   GLUCOSE 92 08/29/2016   CHOL 213 (H) 08/29/2016   TRIG 58.0 08/29/2016   HDL 64.50 08/29/2016   LDLDIRECT 143.7 11/12/2013   LDLCALC 137 (H) 08/29/2016   ALT 6 08/29/2016   AST 14 08/29/2016   NA 137 08/29/2016   K 4.4 08/29/2016   CL 105 08/29/2016   CREATININE 0.87 08/29/2016   BUN 11 08/29/2016   CO2 27 08/29/2016   TSH 0.822 09/10/2015   HGBA1C 6.0 08/29/2016   MICROALBUR <0.7 08/29/2016    US Transvaginal Non-ob  Result Date: 06/05/2016 CLINICAL DATA:  Mid pelvic pain with urinary frequency for the past 2 weeks. Patient reports history of fibroids and ovarian cysts. Onset of last normal menstrual period was May 27, 2016 EXAM: TRANSABDOMINAL AND TRANSVAGINAL ULTRASOUND OF PELVIS TECHNIQUE: Both transabdominal and transvaginal ultrasound examinations of the pelvis were performed. Transabdominal technique was performed for global imaging of the pelvis including uterus, ovaries, adnexal regions, and pelvic cul-de-sac. It was necessary to proceed with endovaginal exam following the transabdominal exam to visualize the uterus, endometrium, ovaries, and adnexal structures. COMPARISON:  No previous studies in PACs FINDINGS: Uterus Measurements: 9.3 x 3.9 x 8.5 cm. Multiple uterine fibroids are observed. The largest lies posteriorly and to the left in the mid uterine corpus and measures 5.7 x 5.3 x 4.6 cm. A smaller left-sided anterior fibroid measures 1.4 x 1.8 x 1.2 cm. An anterior right-sided fundal fibroid  measures 1.8 x 1.9 x 1.8 cm. Endometrium Thickness: 7 mm.  No focal abnormality visualized. Right ovary Measurements: 2.7 x 2.3 x 1.9 cm. The echotexture is normal. A dominant follicle measures 1.9 x 1.8 x 1.6 cm. Left ovary The left ovary could not be visualized. Other findings No abnormal free fluid. IMPRESSION: 1. Multiple uterine fibroids with the largest lying on the left laterally measuring 5.7 cm in greatest dimension. 2. Normal appearance of the right ovary. The left ovary could not be visualized. Electronically Signed   By: David  Martinique M.D.   On: 06/05/2016 12:35   US Pelvis Complete  Result Date: 06/05/2016 CLINICAL DATA:  Mid pelvic pain with urinary frequency for the past 2 weeks. Patient reports history of fibroids and ovarian cysts. Onset of last normal menstrual period was May 27, 2016 EXAM: TRANSABDOMINAL AND TRANSVAGINAL ULTRASOUND OF PELVIS TECHNIQUE: Both transabdominal and transvaginal ultrasound examinations of the pelvis were performed.  Transabdominal technique was performed for global imaging of the pelvis including uterus, ovaries, adnexal regions, and pelvic cul-de-sac. It was necessary to proceed with endovaginal exam following the transabdominal exam to visualize the uterus, endometrium, ovaries, and adnexal structures. COMPARISON:  No previous studies in PACs FINDINGS: Uterus Measurements: 9.3 x 3.9 x 8.5 cm. Multiple uterine fibroids are observed. The largest lies posteriorly and to the left in the mid uterine corpus and measures 5.7 x 5.3 x 4.6 cm. A smaller left-sided anterior fibroid measures 1.4 x 1.8 x 1.2 cm. An anterior right-sided fundal fibroid measures 1.8 x 1.9 x 1.8 cm. Endometrium Thickness: 7 mm.  No focal abnormality visualized. Right ovary Measurements: 2.7 x 2.3 x 1.9 cm. The echotexture is normal. A dominant follicle measures 1.9 x 1.8 x 1.6 cm. Left ovary The left ovary could not be visualized. Other findings No abnormal free fluid. IMPRESSION: 1. Multiple  uterine fibroids with the largest lying on the left laterally measuring 5.7 cm in greatest dimension. 2. Normal appearance of the right ovary. The left ovary could not be visualized. Electronically Signed   By: David  Martinique M.D.   On: 06/05/2016 12:35     Assessment & Plan:  Plan  I have discontinued Ms. Kult sulfamethoxazole-trimethoprim and metroNIDAZOLE. I am also having her maintain her omeprazole, glucose blood, Probiotic Product (PROBIOTIC DAILY PO), Vitamin D (Ergocalciferol), metoprolol succinate, meloxicam, HYDROcodone-acetaminophen, and ACCU-CHEK FASTCLIX LANCETS.  Meds ordered this encounter  Medications  . ACCU-CHEK FASTCLIX LANCETS MISC    Sig: CHECK BLOOD SUGAR DAILY. Dx. 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, unspecified long term insulin use status ([place])    -  Primary   Relevant Medications   ACCU-CHEK FASTCLIX LANCETS MISC   Other Relevant Orders   Comprehensive metabolic panel (Completed)   Lipid panel (Completed)   Hemoglobin A1c (Completed)   Microalbumin / creatinine urine ratio (Completed)   POCT urinalysis dipstick (Completed)   Essential hypertension       Relevant Orders   Comprehensive metabolic panel (Completed)   Lipid panel (Completed)   Hemoglobin A1c (Completed)   Microalbumin / creatinine urine ratio (Completed)   POCT urinalysis dipstick (Completed)   Hyperlipidemia LDL goal <70        check labs   Follow-up: Return in about 6 months (around 02/26/2017) for annual exam, fasting.  [name], DO

## 2016-08-29 NOTE — Patient Instructions (Signed)

## 2016-09-25 ENCOUNTER — Other Ambulatory Visit: Payer: Self-pay | Admitting: Family Medicine

## 2016-09-25 NOTE — Telephone Encounter (Signed)
Patient called to get refill Hydrocodone says she has enough for today only. Please call when ready for pick up  [phone] patient number

## 2016-09-26 NOTE — Telephone Encounter (Signed)
Refill x1 

## 2016-09-26 NOTE — Telephone Encounter (Signed)
Last ov 08/29/16. Last fill 08/24/16 #60 0. Please advise. LB

## 2016-09-27 ENCOUNTER — Other Ambulatory Visit: Payer: Self-pay

## 2016-09-27 MED ORDER — HYDROCODONE-ACETAMINOPHEN 10-325 MG PO TABS: 1.0000 | ORAL_TABLET | Freq: Four times a day (QID) | ORAL | 0 refills | Status: DC | PRN

## 2016-09-27 NOTE — Telephone Encounter (Signed)
Reviewed [place] controlled substance registry.  Refill appropriate.

## 2016-09-27 NOTE — Telephone Encounter (Signed)
Rx placed at front desk for pick up and pt has been notified. 

## 2016-09-27 NOTE — Telephone Encounter (Signed)
Printed Rx, and awaiting provider's signature. LB

## 2016-10-02 MED FILL — HYDROCODON-APAP 10-325: 10-325 | 15 days supply | Qty: 60 | Fill #0

## 2016-10-12 ENCOUNTER — Telehealth: Payer: Self-pay | Admitting: *Deleted

## 2016-10-25 NOTE — Telephone Encounter (Signed)
Drug screen results received and sent to scan.

## 2016-11-03 ENCOUNTER — Other Ambulatory Visit: Payer: Self-pay | Admitting: Family Medicine

## 2016-11-03 NOTE — Telephone Encounter (Signed)
Last ov 08/29/16 Next ov 03/06/17 Next UDS due 02/26/17  Needs contract printed  Will print on Monday along with RX

## 2016-11-03 NOTE — Telephone Encounter (Signed)
Patient is requesting a refill of HYDROcodone-acetaminophen (NORCO) 10-325 MG tablet Please advise  Phone: [phone]

## 2016-11-06 NOTE — Addendum Note (Signed)
Addended by: Marjory Lies on: 11/06/2016 06:02 PM   Modules accepted: Orders

## 2016-11-06 NOTE — Telephone Encounter (Signed)
Last ov 08/29/16 Last fill 09/27/16 #60 0. Next ov 03/06/17  Next UDS due 02/26/17. Please advise. LB

## 2016-11-07 MED ORDER — HYDROCODONE-ACETAMINOPHEN 10-325 MG PO TABS: 1.0000 | ORAL_TABLET | Freq: Four times a day (QID) | ORAL | 0 refills | Status: DC | PRN

## 2016-11-07 NOTE — Telephone Encounter (Signed)
Indication for chronic opioid: chronic pain Medication and dose: norco 10/325 # pills per month: #60 Last UDS date: 08/29/2016 Pain contract signed (Y/N): 2014-- need new Date narcotic database last reviewed (include red flags): 11/07/2016  Ok to refill but need contract

## 2016-11-10 NOTE — Telephone Encounter (Signed)
Left message on pt's vm, Rx is ready for pick up. Pt need to sign contract and provide UDS. LB

## 2016-11-14 MED FILL — HYDROCODON-APAP 10-325: 10-325 | 15 days supply | Qty: 60 | Fill #0

## 2016-11-16 ENCOUNTER — Encounter: Payer: Self-pay | Admitting: Family Medicine

## 2016-11-16 DIAGNOSIS — Z79891 Long term (current) use of opiate analgesic: Secondary | ICD-10-CM | POA: Diagnosis not present

## 2016-12-15 ENCOUNTER — Other Ambulatory Visit: Payer: Self-pay | Admitting: Family Medicine

## 2016-12-15 NOTE — Telephone Encounter (Signed)
Caller name: Relationship to patient: Self Can be reached: [phone]  Pharmacy:  Reason for call: Refill HYDROcodone-acetaminophen (NORCO) 10-325 MG tablet [id number]

## 2016-12-18 NOTE — Telephone Encounter (Signed)
Last seen 11/ 7/17  Last filled #60-0rf 11/07/16  Contract signed 02/11/16  Last UDS 04/06/15 low risk   Please advise   PC

## 2016-12-19 MED ORDER — HYDROCODONE-ACETAMINOPHEN 10-325 MG PO TABS: 1.0000 | ORAL_TABLET | Freq: Four times a day (QID) | ORAL | 0 refills | Status: DC | PRN

## 2016-12-19 NOTE — Telephone Encounter (Signed)
Called the patient left a detailed message hardcopy for hydrocodone is ready for pickup at the front desk at our office

## 2016-12-20 MED FILL — HYDROCODON-APAP 10-325: 10-325 | 15 days supply | Qty: 60 | Fill #0

## 2017-01-22 ENCOUNTER — Telehealth: Payer: Self-pay | Admitting: Family Medicine

## 2017-01-22 MED ORDER — HYDROCODONE-ACETAMINOPHEN 10-325 MG PO TABS: 1.0000 | ORAL_TABLET | Freq: Four times a day (QID) | ORAL | 0 refills | Status: DC | PRN

## 2017-01-22 NOTE — Telephone Encounter (Signed)
Caller name:Carney [place] Relationship to patient: Can be reached:[phone] Pharmacy:  Reason for call:Requesting refill on hydrocodone 10-325mg 

## 2017-01-22 NOTE — Telephone Encounter (Signed)
Refill x1 

## 2017-01-22 NOTE — Telephone Encounter (Signed)
Requesting:  Hydrocodone Contract     11/14/2016 UDS    Low risk  Next due on 05/14/2017 Last OV   08/29/16 Last Refill    #60 no refills on 12/19/2016  Please Advise

## 2017-01-22 NOTE — Telephone Encounter (Signed)
Printed/pcp signed Called the patient informed to pickup hardcopy at the front desk.

## 2017-01-23 MED FILL — HYDROCODON-APAP 10-325: 10-325 | 15 days supply | Qty: 60 | Fill #0

## 2017-02-06 ENCOUNTER — Other Ambulatory Visit: Payer: Self-pay | Admitting: Family Medicine

## 2017-02-06 DIAGNOSIS — Z1231 Encounter for screening mammogram for malignant neoplasm of breast: Secondary | ICD-10-CM

## 2017-02-20 ENCOUNTER — Telehealth: Payer: Self-pay | Admitting: Family Medicine

## 2017-02-20 ENCOUNTER — Encounter: Payer: Self-pay | Admitting: Family Medicine

## 2017-02-20 NOTE — Telephone Encounter (Signed)
Letter completed and called the patient informed release to mychart and also did put a copy in the mail to her.

## 2017-02-20 NOTE — Telephone Encounter (Signed)
Ok to write note

## 2017-02-20 NOTE — Telephone Encounter (Signed)
Pt called in because she says that she is trying to get her old job back. Pt need Dr. Etter Sjogren to provide her with a letter stating that she is suffering from her current condition Fiber Myalgia and chronic pain. Pt says that note need to contain that she has to sit and stand for her job during the day. Pt says that if possible if provider could complete as soon as possible as she is trying to go back to work as soon as possible.   Pt would like to know if provider is able to put note on Mychart so that she can print off to take in.    Please assist further.

## 2017-02-22 ENCOUNTER — Other Ambulatory Visit: Payer: Self-pay | Admitting: Family Medicine

## 2017-02-22 NOTE — Telephone Encounter (Signed)
Caller name: Relationship to patient: Self  Can be reached: [phone] Pharmacy:  Reason for call: Refill HYDROcodone-acetaminophen (NORCO) 10-325 MG tablet [[id number]]

## 2017-02-23 MED ORDER — HYDROCODONE-ACETAMINOPHEN 10-325 MG PO TABS: 1.0000 | ORAL_TABLET | Freq: Four times a day (QID) | ORAL | 0 refills | Status: DC | PRN

## 2017-02-23 MED FILL — HYDROCODON-APAP 10-325: 10-325 | 15 days supply | Qty: 60 | Fill #0

## 2017-02-23 NOTE — Telephone Encounter (Signed)
Printed hardcopy and called the patient informed to pickup at the front desk. (left message)

## 2017-02-23 NOTE — Telephone Encounter (Signed)
Requesting:    Hydrocodone Contract     11/14/2016 UDS   Low risk on 05/14/2017 Last OV     08/29/16----future appt 03/06/17 Last Refill     #60 no refills on 01/22/2017  Please Advise

## 2017-02-23 NOTE — Telephone Encounter (Signed)
Refill x1 

## 2017-03-05 ENCOUNTER — Ambulatory Visit
Admission: RE | Admit: 2017-03-05 | Discharge: 2017-03-05 | Disposition: A | Discharge status: Home / Self Care | Payer: Medicare Other | Source: Ambulatory Visit | Attending: Family Medicine | Admitting: Family Medicine

## 2017-03-05 DIAGNOSIS — Z1231 Encounter for screening mammogram for malignant neoplasm of breast: Secondary | ICD-10-CM | POA: Diagnosis not present

## 2017-03-06 ENCOUNTER — Encounter: Payer: Self-pay | Admitting: Family Medicine

## 2017-03-06 ENCOUNTER — Ambulatory Visit (INDEPENDENT_AMBULATORY_CARE_PROVIDER_SITE_OTHER): Payer: Medicare Other | Admitting: Family Medicine

## 2017-03-06 VITALS — BP 106/78 | HR 101 | Temp 97.9°F | Resp 16 | Ht 66.0 in | Wt 154.8 lb

## 2017-03-06 DIAGNOSIS — R000 Tachycardia, unspecified: Secondary | ICD-10-CM | POA: Diagnosis not present

## 2017-03-06 DIAGNOSIS — M5136 Other intervertebral disc degeneration, lumbar region: Secondary | ICD-10-CM

## 2017-03-06 DIAGNOSIS — E1151 Type 2 diabetes mellitus with diabetic peripheral angiopathy without gangrene: Secondary | ICD-10-CM

## 2017-03-06 DIAGNOSIS — Z0000 Encounter for general adult medical examination without abnormal findings: Secondary | ICD-10-CM

## 2017-03-06 DIAGNOSIS — E785 Hyperlipidemia, unspecified: Secondary | ICD-10-CM

## 2017-03-06 LAB — CBC WITH DIFFERENTIAL/PLATELET
BASOS PCT: 0.8 % (ref 0.0–3.0)
Basophils Absolute: 0.1 10*3/uL (ref 0.0–0.1)
EOS ABS: 0 10*3/uL (ref 0.0–0.7)
Eosinophils Relative: 0.4 % (ref 0.0–5.0)
HCT: 41.8 % (ref 36.0–46.0)
Hemoglobin: 13.9 g/dL (ref 12.0–15.0)
Lymphocytes Relative: 18.7 % (ref 12.0–46.0)
Lymphs Abs: 1.3 10*3/uL (ref 0.7–4.0)
MCHC: 33.3 g/dL (ref 30.0–36.0)
MCV: 98.2 fl (ref 78.0–100.0)
MONO ABS: 0.3 10*3/uL (ref 0.1–1.0)
Monocytes Relative: 4.3 % (ref 3.0–12.0)
Neutro Abs: 5.5 10*3/uL (ref 1.4–7.7)
Neutrophils Relative %: 75.8 % (ref 43.0–77.0)
Platelets: 276 10*3/uL (ref 150.0–400.0)
RBC: 4.25 Mil/uL (ref 3.87–5.11)
RDW: 13.4 % (ref 11.5–15.5)
WBC: 7.2 10*3/uL (ref 4.0–10.5)

## 2017-03-06 LAB — T4, FREE: Free T4: 1.02 ng/dL (ref 0.60–1.60)

## 2017-03-06 LAB — COMPREHENSIVE METABOLIC PANEL
ALBUMIN: 4.5 g/dL (ref 3.5–5.2)
ALT: 7 U/L (ref 0–35)
AST: 12 U/L (ref 0–37)
Alkaline Phosphatase: 46 U/L (ref 39–117)
BILIRUBIN TOTAL: 0.4 mg/dL (ref 0.2–1.2)
BUN: 10 mg/dL (ref 6–23)
CO2: 25 meq/L (ref 19–32)
CREATININE: 0.92 mg/dL (ref 0.40–1.20)
Calcium: 9.4 mg/dL (ref 8.4–10.5)
Chloride: 107 mEq/L (ref 96–112)
GFR: 85.07 mL/min (ref >60.00)
Glucose, Bld: 106 mg/dL — ABNORMAL HIGH (ref 70–99)
Potassium: 4.4 mEq/L (ref 3.5–5.1)
SODIUM: 138 meq/L (ref 135–145)
Total Protein: 7.6 g/dL (ref 6.0–8.3)

## 2017-03-06 LAB — LIPID PANEL
CHOL/HDL RATIO: 3
Cholesterol: 221 mg/dL — ABNORMAL HIGH (ref 0–200)
HDL: 63.6 mg/dL (ref >39.00)
LDL Cholesterol: 146 mg/dL — ABNORMAL HIGH (ref 0–99)
NONHDL: 157.58
Triglycerides: 57 mg/dL (ref 0.0–149.0)
VLDL: 11.4 mg/dL (ref 0.0–40.0)

## 2017-03-06 LAB — TSH: TSH: 1.02 u[IU]/mL (ref 0.35–4.50)

## 2017-03-06 LAB — T3, FREE: T3 FREE: 3.2 pg/mL (ref 2.3–4.2)

## 2017-03-06 NOTE — Assessment & Plan Note (Signed)
con't prn pain meds stable

## 2017-03-06 NOTE — Assessment & Plan Note (Addendum)
Encouraged heart healthy diet, increase exercise, avoid trans fats, consider a krill oil cap daily 

## 2017-03-06 NOTE — Assessment & Plan Note (Signed)
See avs ghm utd  Check labs 

## 2017-03-06 NOTE — Assessment & Plan Note (Signed)
Stable Controlling with occassional pain med and exercise

## 2017-03-06 NOTE — Assessment & Plan Note (Signed)
hgba1c acceptable, minimize simple carbs. Increase exercise as tolerated. Continue current meds 

## 2017-03-06 NOTE — Progress Notes (Signed)
Subjective:     Emily Maddox is a 46 y.o. female and is here for a comprehensive physical exam. The patient reports no problems.  She has not been taking her Toprol because she states it makes her feel exhausted.  Social History   Social History  . Marital status: Single    Spouse name: N/A  . Number of children: N/A  . Years of education: N/A   Occupational History  .  Unemployed   Social History Main Topics  . Smoking status: Current Some Day Smoker    Packs/day: 1.00    Years: 19.00    Types: Cigarettes  . Smokeless tobacco: Never Used     Comment: couldn't tolerate chantix, cut down to 0.5 pack since 11/2013  . Alcohol use No  . Drug use: No  . Sexual activity: Yes    Partners: Female   Other Topics Concern  . Not on file   Social History Narrative   She lives with father.  No children   Highest level of education:  B.S. in business administration   Exercise--  3-4 x a week   Health Maintenance  Topic Date Due  . PNEUMOCOCCAL POLYSACCHARIDE VACCINE (1) 08/21/1974  . FOOT EXAM  11/10/2016  . HEMOGLOBIN A1C  02/26/2017  . INFLUENZA VACCINE  05/23/2017  . PAP SMEAR  07/27/2017  . URINE MICROALBUMIN  08/29/2017  . OPHTHALMOLOGY EXAM  11/23/2017  . TETANUS/TDAP  02/04/2019  . COLONOSCOPY  01/02/2021  . HIV Screening  Completed    The following portions of the patient's history were reviewed and updated as appropriate:  She  has a past medical history of Chronic back pain; Diabetes mellitus; Hyperlipidemia; Hypertension; and Tachycardia. She  does not have any pertinent problems on file. She  has a past surgical history that includes Breast excisional biopsy (Left, 2003). Her family history includes Breast cancer (age of onset: 63) in her mother; Cancer (age of onset: 72) in her father; Cancer (age of onset: 68) in her mother; Diabetes in her father, mother, and sister; Heart disease in her father; Hyperlipidemia in her father and mother; Hypertension in her  father and mother. She  reports that she has been smoking Cigarettes.  She has a 19.00 pack-year smoking history. She has never used smokeless tobacco. She reports that she does not drink alcohol or use drugs. She has a current medication list which includes the following prescription(s): accu-chek fastclix lancets, glucose blood, hydrocodone-acetaminophen, omeprazole, probiotic product, and metoprolol succinate. Current Outpatient Prescriptions on File Prior to Visit  Medication Sig Dispense Refill  . ACCU-CHEK FASTCLIX LANCETS MISC CHECK BLOOD SUGAR DAILY. Dx. E11.9 102 each 11  . glucose blood (ACCU-CHEK AVIVA PLUS) test strip Check blood sugar once daily. Dx:E11.9 100 each 5  . HYDROcodone-acetaminophen (NORCO) 10-325 MG tablet Take 1 tablet by mouth every 6 (six) hours as needed. 60 tablet 0  . omeprazole (PRILOSEC) 20 MG capsule Take 1 capsule (20 mg total) by mouth daily as needed (acid reflux). 90 capsule 3  . Probiotic Product (PROBIOTIC DAILY PO) Take 1 capsule by mouth daily.    . metoprolol succinate (TOPROL-XL) 25 MG 24 hr tablet Take 25 mg by mouth daily.     No current facility-administered medications on file prior to visit.    She is allergic to ultram [tramadol]..  Review of Systems Review of Systems  Constitutional: Negative for activity change, appetite change and fatigue.  HENT: Negative for hearing loss, congestion, tinnitus and ear discharge.  dentist q81m Eyes: Negative for visual disturbance (see optho q1y -- vision corrected to 20/20 with glasses).  Respiratory: Negative for cough, chest tightness and shortness of breath.   Cardiovascular: Negative for chest pain, palpitations and leg swelling.  Gastrointestinal: Negative for abdominal pain, diarrhea, constipation and abdominal distention.  Genitourinary: Negative for urgency, frequency, decreased urine volume and difficulty urinating.  Musculoskeletal: Negative for back pain, arthralgias and gait problem.  Skin:  Negative for color change, pallor and rash.  Neurological: Negative for dizziness, light-headedness, numbness and headaches.  Hematological: Negative for adenopathy. Does not bruise/bleed easily.  Psychiatric/Behavioral: Negative for suicidal ideas, confusion, sleep disturbance, self-injury, dysphoric mood, decreased concentration and agitation.       Objective:    BP 106/78 (BP Location: Left Arm, Cuff Size: Normal)   Pulse (!) 101   Temp 97.9 F (36.6 C) (Oral)   Resp 16   Ht 5\' 6"  (1.676 m)   Wt 154 lb 12.8 oz (70.2 kg)   LMP 02/04/2017   SpO2 99%   BMI 24.99 kg/m  General appearance: alert, cooperative, appears stated age and no distress Head: Normocephalic, without obvious abnormality, atraumatic Eyes: conjunctivae/corneas clear. PERRL, EOM's intact. Fundi benign. Ears: normal TM's and external ear canals both ears Nose: Nares normal. Septum midline. Mucosa normal. No drainage or sinus tenderness. Throat: lips, mucosa, and tongue normal; teeth and gums normal Neck: no adenopathy, no carotid bruit, no JVD, supple, symmetrical, trachea midline and thyroid not enlarged, symmetric, no tenderness/mass/nodules Back: symmetric, no curvature. ROM normal. No CVA tenderness. Lungs: clear to auscultation bilaterally Breasts: gyn Heart: regular rate and rhythm, S1, S2 normal, no murmur, click, rub or gallop Abdomen: soft, non-tender; bowel sounds normal; no masses,  no organomegaly Pelvic: deferred--gyn Extremities: extremities normal, atraumatic, no cyanosis or edema Pulses: 2+ and symmetric Skin: Skin color, texture, turgor normal. No rashes or lesions Lymph nodes: Cervical, supraclavicular, and axillary nodes normal. Neurologic: Alert and oriented X 3, normal strength and tone. Normal symmetric reflexes. Normal coordination and gait   Sensory exam of the foot is normal, tested with the monofilament. Good pulses, no lesions or ulcers, good peripheral pulses. Assessment:     Healthy female exam.      Plan:    ghm utd Check labs See After Visit Summary for Counseling Recommendations    1. Hyperlipidemia LDL goal <100 Encouraged heart healthy diet, increase exercise, avoid trans fats, consider a krill oil cap daily - POCT Urinalysis Dipstick (Automated) - CBC with Differential/Platelet - Comprehensive metabolic panel - Lipid panel - TSH  2. DM (diabetes mellitus) type II, controlled, with peripheral vascular disorder (HCC) hgba1c acceptable, minimize simple carbs. Increase exercise as tolerated. Continue current meds  - POCT Urinalysis Dipstick (Automated) - CBC with Differential/Platelet - Comprehensive metabolic panel - Lipid panel - TSH  3. Lumbar degenerative disc disease On prn pain meds   4. Preventative health care See above  5. Tachycardia Pt unable to tolerate BB-- echo normal,  Feels palpitations frequently--- refer to cardio Repeat labs ekg -- ekg--nsr-- poor r wave progression - Ambulatory referral to Cardiology - T3, free - T4, free - Cardiac event monitor; Future

## 2017-03-06 NOTE — Progress Notes (Signed)
Subjective:   Emily Maddox is a 46 y.o. female who presents for Medicare Annual (Subsequent) preventive examination.  Review of Systems:  No ROS.  Medicare Wellness Visit.  Cardiac Risk Factors include: diabetes mellitus;dyslipidemia;smoking/ tobacco exposure  Sleep patterns:  Doesn't always sleep well due to pain. Has good nights and bad nights. Home Safety/Smoke Alarms: Feels safe at home. Lives alone in one level home.  Smoke Alarms present.   Firearm Safety: No firearms.     Counseling:   Eye Exam- Last eye exam 2018 at [facility] in [place], [place]. Dental- Follows w/ dentist PRN. Usually searches for a Groupon to keep dental costs low and goes to whichever provider takes the [employer].  Female:   Pap- last 05/2015 w/ Dr. Linda Hedges. NEGATIVE FOR INTRAEPITHELIAL LESIONS OR MALIGNANCY.   Mammo- last 03/05/17. BI-RADS CATEGORY  1: Negative. CCS- last 01/03/16. 3 polyps removed. 5 year recall.    Information above reviewed with patient from previous AWV and updated as appropriate. All information is accurate as of 03/06/17.  Objective:     Vitals: BP 106/78 (BP Location: Left Arm, Cuff Size: Normal)   Pulse (!) 101   Temp 97.9 F (36.6 C) (Oral)   Resp 16   Ht 5\' 6"  (1.676 m)   Wt 154 lb 12.8 oz (70.2 kg)   LMP 02/04/2017   SpO2 99%   BMI 24.99 kg/m   Body mass index is 24.99 kg/m.   Tobacco History  Smoking Status  . Current Some Day Smoker  . Packs/day: 1.00  . Years: 19.00  . Types: Cigarettes  Smokeless Tobacco  . Never Used    Comment: couldn't tolerate chantix, cut down to 0.5 pack since 11/2013     Ready to quit: No Counseling given: Yes   Past Medical History:  Diagnosis Date  . 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 Diabetes mellitus   . Hyperlipidemia   . Hypertension   . Tachycardia    Past Surgical History:  Procedure Laterality Date  . BREAST EXCISIONAL BIOPSY Left 2003   Family  History  Problem Relation Age of Onset  . Cancer Mother 34       breast  . Hypertension Mother   . Hyperlipidemia Mother   . Diabetes Mother   . Breast cancer Mother 38  . Cancer Father 63       colon  . Hypertension Father   . Hyperlipidemia Father   . Heart disease Father        a fib  . Diabetes Father   . Diabetes Sister   . Breast cancer Unknown   . Colon cancer Unknown    History  Sexual Activity  . Sexual activity: Yes  . Partners: Female    Outpatient Encounter Prescriptions as of 03/06/2017  Medication Sig  . ACCU-CHEK FASTCLIX LANCETS MISC CHECK BLOOD SUGAR DAILY. Dx. E11.9  . glucose blood (ACCU-CHEK AVIVA PLUS) test strip Check blood sugar once daily. Dx:E11.9  . HYDROcodone-acetaminophen (NORCO) 10-325 MG tablet Take 1 tablet by mouth every 6 (six) hours as needed.  Marland Kitchen omeprazole (PRILOSEC) 20 MG capsule Take 1 capsule (20 mg total) by mouth daily as needed (acid reflux).  . Probiotic Product (PROBIOTIC DAILY PO) Take 1 capsule by mouth daily.  . [DISCONTINUED] meloxicam (MOBIC) 15 MG tablet Take 1 tablet (15 mg total) by mouth daily. (Patient not taking: Reported on 08/29/2016)  . [DISCONTINUED] metoprolol succinate (TOPROL-XL) 25  MG 24 hr tablet Take 25 mg by mouth daily.  . [DISCONTINUED] Vitamin D, Ergocalciferol, (DRISDOL) 50000 units CAPS capsule Take 1 capsule (50,000 Units total) by mouth every 7 (seven) days.   No facility-administered encounter medications on file as of 03/06/2017.     Activities of Daily Living In your present state of health, do you have any difficulty performing the following activities: 03/06/2017  Hearing? N  Vision? N  Difficulty concentrating or making decisions? N  Walking or climbing stairs? N  Dressing or bathing? N  Doing errands, shopping? N  Preparing Food and eating ? N  Using the Toilet? N  In the past six months, have you accidently leaked urine? N  Do you have problems with loss of bowel control? N  Managing your  Medications? N  Managing your Finances? N  Housekeeping or managing your Housekeeping? N  Some recent data might be hidden    Patient Care Team: Carollee Herter, Alferd Apa, DO as PCP - General Juanita Craver, MD as Consulting Physician (Gastroenterology) Linda Hedges, DO as Consulting Physician (Obstetrics and Gynecology)    Assessment:    Physical assessment deferred to PCP.  Exercise Activities and Dietary recommendations Current Exercise Habits: Home exercise routine, Type of exercise: Other - see comments (cardio), Frequency (Times/Week): 3  Diet (meal preparation, eat out, water intake, caffeinated beverages, dairy products, fruits and vegetables): on average, 3 small meals per day. Meals vary. Usually has starch, vegetable, and protein at every meal. Snacks on fruits and nuts between meals. 'Water is my BFF.'      Goals    . Increase physical activity    . Quit smoking / using tobacco      Fall Risk Fall Risk  03/06/2017 11/11/2015  Falls in the past year? Yes Yes  Number falls in past yr: 2 or more 2 or more  Injury with Fall? No No  Risk Factor Category  - High Fall Risk  Risk for fall due to : Impaired balance/gait Impaired balance/gait;History of fall(s)  Risk for fall due to (comments): - occasional leg weakness and numbness  Follow up - Education provided;Falls prevention discussed   Depression Screen PHQ 2/9 Scores 03/06/2017 08/29/2016 11/11/2015  PHQ - 2 Score 0 0 0     Cognitive Function       Ad8 score reviewed for issues:  Issues making decisions: No  Less interest in hobbies / activities: No  Repeats questions, stories (family complaining): No  Trouble using ordinary gadgets (microwave, computer, phone): No  Forgets the month or year: No  Mismanaging finances: No  Remembering appts: No  Daily problems with thinking and/or memory: No Ad8 score is= 0     Immunization History  Administered Date(s) Administered  . Hep A / Hep B 02/03/2009,  04/05/2009, 09/03/2009, 10/31/2010  . Td 02/03/2009   Screening Tests Health Maintenance  Topic Date Due  . PNEUMOCOCCAL POLYSACCHARIDE VACCINE (1) 08/21/1974  . FOOT EXAM  11/10/2016  . HEMOGLOBIN A1C  02/26/2017  . INFLUENZA VACCINE  05/23/2017  . PAP SMEAR  07/27/2017  . URINE MICROALBUMIN  08/29/2017  . OPHTHALMOLOGY EXAM  11/23/2017  . TETANUS/TDAP  02/04/2019  . COLONOSCOPY  01/02/2021  . HIV Screening  Completed      Plan:    Follow-up w/ PCP as directed.   Create and bring a copy of your advance directives to your next office visit.  Emmi education assigned via portal: Smoking Cessation: Thinking About Quitting Smoking; Diabetes-Smoking; Quitting  Smoking  Declines pneumococcal vaccine.  Eye exam report requested from [facility] (in [facility]). Report will be faxed to [phone].  I have personally reviewed and noted the following in the patient's chart:   . Medical and social history . Use of alcohol, tobacco or illicit drugs  . Current medications and supplements . Functional ability and status . Nutritional status . Physical activity . Advanced directives . List of other physicians . Vitals . Screenings to include cognitive, depression, and falls . Referrals and appointments  In addition, I have reviewed and discussed with patient certain preventive protocols, quality metrics, and best practice recommendations. A written personalized care plan for preventive services as well as general preventive health recommendations were provided to patient.     Dorrene German, RN  03/06/2017

## 2017-03-06 NOTE — Patient Instructions (Addendum)
Emily Maddox , Thank you for taking time to come for your Medicare Wellness Visit. I appreciate your ongoing commitment to your health goals. Please review the following plan we discussed and let me know if I can assist you in the future.   Smoke Free Text Program: SuperbApps.be  https://wilkins.info/  Create and bring a copy of your advance directives to your next office visit.  These are the goals we discussed: Goals    . Increase physical activity    . Quit smoking / using tobacco       This is a list of the screening recommended for you and due dates:  Health Maintenance  Topic Date Due  . Pneumococcal vaccine (1) 08/[birth year]/1974  . Complete foot exam   11/10/2016  . Hemoglobin A1C  02/26/2017  . Flu Shot  05/23/2017  . Pap Smear  07/27/2017  . Urine Protein Check  08/29/2017  . Eye exam for diabetics  11/23/2017  . Tetanus Vaccine  02/04/2019  . Colon Cancer Screening  01/02/2021  . HIV Screening  Completed    Preventive Care 40-64 Years, Female Preventive care refers to lifestyle choices and visits with your health care provider that can promote health and wellness. What does preventive care include?  A yearly physical exam. This is also called an annual well check.  Dental exams once or twice a year.  Routine eye exams. Ask your health care provider how often you should have your eyes checked.  Personal lifestyle choices, including:  Daily care of your teeth and gums.  Regular physical activity.  Eating a healthy diet.  Avoiding tobacco and drug use.  Limiting alcohol use.  Practicing safe sex.  Taking low-dose aspirin daily starting at age 38.  Taking vitamin and mineral supplements as recommended by your health care provider. What happens during an annual well check? The services and screenings done by your health care provider during your annual well check will depend on your age, overall health, lifestyle risk  factors, and family history of disease. Counseling  Your health care provider may ask you questions about your:  Alcohol use.  Tobacco use.  Drug use.  Emotional well-being.  Home and relationship well-being.  Sexual activity.  Eating habits.  Work and work Statistician.  Method of birth control.  Menstrual cycle.  Pregnancy history. Screening  You may have the following tests or measurements:  Height, weight, and BMI.  Blood pressure.  Lipid and cholesterol levels. These may be checked every 5 years, or more frequently if you are over 6 years old.  Skin check.  Lung cancer screening. You may have this screening every year starting at age 84 if you have a 30-pack-year history of smoking and currently smoke or have quit within the past 15 years.  Fecal occult blood test (FOBT) of the stool. You may have this test every year starting at age 69.  Flexible sigmoidoscopy or colonoscopy. You may have a sigmoidoscopy every 5 years or a colonoscopy every 10 years starting at age 57.  Hepatitis C blood test.  Hepatitis B blood test.  Sexually transmitted disease (STD) testing.  Diabetes screening. This is done by checking your blood sugar (glucose) after you have not eaten for a while (fasting). You may have this done every 1-3 years.  Mammogram. This may be done every 1-2 years. Talk to your health care provider about when you should start having regular mammograms. This may depend on whether you have a family history of breast  cancer.  BRCA-related cancer screening. This may be done if you have a family history of breast, ovarian, tubal, or peritoneal cancers.  Pelvic exam and Pap test. This may be done every 3 years starting at age 22. Starting at age 4, this may be done every 5 years if you have a Pap test in combination with an HPV test.  Bone density scan. This is done to screen for osteoporosis. You may have this scan if you are at high risk for  osteoporosis. Discuss your test results, treatment options, and if necessary, the need for more tests with your health care provider. Vaccines  Your health care provider may recommend certain vaccines, such as:  Influenza vaccine. This is recommended every year.  Tetanus, diphtheria, and acellular pertussis (Tdap, Td) vaccine. You may need a Td booster every 10 years.  Varicella vaccine. You may need this if you have not been vaccinated.  Zoster vaccine. You may need this after age 43.  Measles, mumps, and rubella (MMR) vaccine. You may need at least one dose of MMR if you were born in 1957 or later. You may also need a second dose.  Pneumococcal 13-valent conjugate (PCV13) vaccine. You may need this if you have certain conditions and were not previously vaccinated.  Pneumococcal polysaccharide (PPSV23) vaccine. You may need one or two doses if you smoke cigarettes or if you have certain conditions.  Meningococcal vaccine. You may need this if you have certain conditions.  Hepatitis A vaccine. You may need this if you have certain conditions or if you travel or work in places where you may be exposed to hepatitis A.  Hepatitis B vaccine. You may need this if you have certain conditions or if you travel or work in places where you may be exposed to hepatitis B.  Haemophilus influenzae type b (Hib) vaccine. You may need this if you have certain conditions. Talk to your health care provider about which screenings and vaccines you need and how often you need them. This information is not intended to replace advice given to you by your health care provider. Make sure you discuss any questions you have with your health care provider. Document Released: 11/05/2015 Document Revised: 06/28/2016 Document Reviewed: 08/10/2015 Elsevier Interactive Patient Education  2017 [employer].  Steps to Quit Smoking Smoking tobacco can be harmful to your health and can affect almost every organ in your  body. Smoking puts you, and those around you, at risk for developing many serious chronic diseases. Quitting smoking is difficult, but it is one of the best things that you can do for your health. It is never too late to quit. What are the benefits of quitting smoking? When you quit smoking, you lower your risk of developing serious diseases and conditions, such as:  Lung cancer or lung disease, such as COPD.  Heart disease.  Stroke.  Heart attack.  Infertility.  Osteoporosis and bone fractures. Additionally, symptoms such as coughing, wheezing, and shortness of breath may get better when you quit. You may also find that you get sick less often because your body is stronger at fighting off colds and infections. If you are pregnant, quitting smoking can help to reduce your chances of having a baby of low birth weight. How do I get ready to quit? When you decide to quit smoking, create a plan to make sure that you are successful. Before you quit:  Pick a date to quit. Set a date within the next two weeks to  give you time to prepare.  Write down the reasons why you are quitting. Keep this list in places where you will see it often, such as on your bathroom mirror or in your car or wallet.  Identify the people, places, things, and activities that make you want to smoke (triggers) and avoid them. Make sure to take these actions:  Throw away all cigarettes at home, at work, and in your car.  Throw away smoking accessories, such as Set designer.  Clean your car and make sure to empty the ashtray.  Clean your home, including curtains and carpets.  Tell your family, friends, and coworkers that you are quitting. Support from your loved ones can make quitting easier.  Talk with your health care provider about your options for quitting smoking.  Find out what treatment options are covered by your health insurance. What strategies can I use to quit smoking? Talk with your  healthcare provider about different strategies to quit smoking. Some strategies include:  Quitting smoking altogether instead of gradually lessening how much you smoke over a period of time. Research shows that quitting "cold [place]" is more successful than gradually quitting.  Attending in-person counseling to help you build problem-solving skills. You are more likely to have success in quitting if you attend several counseling sessions. Even short sessions of 10 minutes can be effective.  Finding resources and support systems that can help you to quit smoking and remain smoke-free after you quit. These resources are most helpful when you use them often. They can include:  Online chats with a Veterinary surgeon.  Telephone quitlines.  Printed Materials engineer.  Support groups or group counseling.  Text messaging programs.  Mobile phone applications.  Taking medicines to help you quit smoking. (If you are pregnant or breastfeeding, talk with your health care provider first.) Some medicines contain nicotine and some do not. Both types of medicines help with cravings, but the medicines that include nicotine help to relieve withdrawal symptoms. Your health care provider may recommend:  Nicotine patches, gum, or lozenges.  Nicotine inhalers or sprays.  Non-nicotine medicine that is taken by mouth. Talk with your health care provider about combining strategies, such as taking medicines while you are also receiving in-person counseling. Using these two strategies together makes you more likely to succeed in quitting than if you used either strategy on its own. If you are pregnant or breastfeeding, talk with your health care provider about finding counseling or other support strategies to quit smoking. Do not take medicine to help you quit smoking unless told to do so by your health care provider. What things can I do to make it easier to quit? Quitting smoking might feel overwhelming at first, but  there is a lot that you can do to make it easier. Take these important actions:  Reach out to your family and friends and ask that they support and encourage you during this time. Call telephone quitlines, reach out to support groups, or work with a counselor for support.  Ask people who smoke to avoid smoking around you.  Avoid places that trigger you to smoke, such as bars, parties, or smoke-break areas at work.  Spend time around people who do not smoke.  Lessen stress in your life, because stress can be a smoking trigger for some people. To lessen stress, try:  Exercising regularly.  Deep-breathing exercises.  Yoga.  Meditating.  Performing a body scan. This involves closing your eyes, scanning your body from head to  toe, and noticing which parts of your body are particularly tense. Purposefully relax the muscles in those areas.  Download or purchase mobile phone or tablet apps (applications) that can help you stick to your quit plan by providing reminders, tips, and encouragement. There are many free apps, such as QuitGuide from the [employer] Office manager for Disease Control and Prevention). You can find other support for quitting smoking (smoking cessation) through smokefree.gov and other websites. How will I feel when I quit smoking? Within the first 24 hours of quitting smoking, you may start to feel some withdrawal symptoms. These symptoms are usually most noticeable 2-3 days after quitting, but they usually do not last beyond 2-3 weeks. Changes or symptoms that you might experience include:  Mood swings.  Restlessness, anxiety, or irritation.  Difficulty concentrating.  Dizziness.  Strong cravings for sugary foods in addition to nicotine.  Mild weight gain.  Constipation.  Nausea.  Coughing or a sore throat.  Changes in how your medicines work in your body.  A depressed mood.  Difficulty sleeping (insomnia). After the first 2-3 weeks of quitting, you may start to  notice more positive results, such as:  Improved sense of smell and taste.  Decreased coughing and sore throat.  Slower heart rate.  Lower blood pressure.  Clearer skin.  The ability to breathe more easily.  Fewer sick days. Quitting smoking is very challenging for most people. Do not get discouraged if you are not successful the first time. Some people need to make many attempts to quit before they achieve long-term success. Do your best to stick to your quit plan, and talk with your health care provider if you have any questions or concerns. This information is not intended to replace advice given to you by your health care provider. Make sure you discuss any questions you have with your health care provider. Document Released: 10/03/2001 Document Revised: 06/06/2016 Document Reviewed: 02/23/2015 Elsevier Interactive Patient Education  2017 [employer].

## 2017-03-08 ENCOUNTER — Other Ambulatory Visit: Payer: Self-pay | Admitting: Family Medicine

## 2017-03-08 DIAGNOSIS — E785 Hyperlipidemia, unspecified: Secondary | ICD-10-CM

## 2017-03-08 MED ORDER — SIMVASTATIN 20 MG PO TABS: 20.0000 mg | ORAL_TABLET | Freq: Every day | ORAL | 2 refills | Status: DC

## 2017-03-14 ENCOUNTER — Ambulatory Visit (INDEPENDENT_AMBULATORY_CARE_PROVIDER_SITE_OTHER): Payer: Medicare Other

## 2017-03-14 DIAGNOSIS — R000 Tachycardia, unspecified: Secondary | ICD-10-CM

## 2017-03-23 ENCOUNTER — Encounter: Payer: Self-pay | Admitting: Family Medicine

## 2017-03-23 ENCOUNTER — Other Ambulatory Visit: Payer: Self-pay | Admitting: Family Medicine

## 2017-03-27 ENCOUNTER — Telehealth: Payer: Self-pay | Admitting: Family Medicine

## 2017-03-27 NOTE — Telephone Encounter (Signed)
Caller name: Relationship to patient: Self Can be reached: [phone]  Pharmacy:  Reason for call: Refill HYDROcodone-acetaminophen (NORCO) 10-325 MG tablet [[id number]]

## 2017-04-03 NOTE — Telephone Encounter (Signed)
Requesting:  hydrocodone Contract   11/14/2016 UDS   Low risk next due on 05/14/2017 Last OV    03/06/2017 Last Refill    #60 on 02/23/17  Please Advise

## 2017-04-03 NOTE — Telephone Encounter (Signed)
database at your desk

## 2017-04-03 NOTE — Telephone Encounter (Signed)
Indication for chronic opioid: chronic pain Medication and dose: hydrocodone  # pills per month: 60  Last UDS date: 04/2016 Pain contract signed (Y/N): 11/14/2016 Date narcotic database last reviewed (include red flags): 01/07/2017

## 2017-04-03 NOTE — Telephone Encounter (Signed)
database 

## 2017-04-04 MED ORDER — HYDROCODONE-ACETAMINOPHEN 10-325 MG PO TABS: 1.0000 | ORAL_TABLET | Freq: Four times a day (QID) | ORAL | 0 refills | Status: DC | PRN

## 2017-04-04 NOTE — Telephone Encounter (Signed)
Awaiting provider signature

## 2017-04-04 NOTE — Addendum Note (Signed)
Addended by: Kem Boroughs D on: 04/04/2017 08:42 AM   Modules accepted: Orders

## 2017-04-05 MED FILL — HYDROCODON-APAP 10-325: 10-325 | 15 days supply | Qty: 60 | Fill #0

## 2017-04-06 ENCOUNTER — Encounter: Payer: Self-pay | Admitting: Family Medicine

## 2017-04-06 NOTE — Telephone Encounter (Signed)
Patient emailed form, form forwarded to Sharon for PCP review.

## 2017-04-06 NOTE — Telephone Encounter (Signed)
Put note on your desk

## 2017-04-09 ENCOUNTER — Encounter: Payer: Self-pay | Admitting: Family Medicine

## 2017-04-10 ENCOUNTER — Encounter: Payer: Self-pay | Admitting: *Deleted

## 2017-04-17 ENCOUNTER — Encounter: Payer: Self-pay | Admitting: *Deleted

## 2017-04-23 ENCOUNTER — Telehealth: Payer: Self-pay | Admitting: Family Medicine

## 2017-04-23 NOTE — Telephone Encounter (Signed)
Work received letter, it was not signed. Please resend with drs signature

## 2017-04-23 NOTE — Telephone Encounter (Signed)
Work did received signed letter per patient

## 2017-04-24 IMAGING — US US PELVIS COMPLETE
1 series · 13 of 25 positions shown · non-contrast
Comparison: No previous studies in PACs

CLINICAL DATA: Mid pelvic pain with urinary frequency for the past
2 weeks. Patient reports history of fibroids and ovarian cysts.
Onset of last normal menstrual period was May 27, 2016

EXAM:
TRANSABDOMINAL AND TRANSVAGINAL ULTRASOUND OF PELVIS
TECHNIQUE: Both transabdominal and transvaginal ultrasound examinations of the
pelvis were performed. Transabdominal technique was performed for
global imaging of the pelvis including uterus, ovaries, adnexal
regions, and pelvic cul-de-sac. It was necessary to proceed with
endovaginal exam following the transabdominal exam to visualize the
uterus, endometrium, ovaries, and adnexal structures..

[Series 1: us pelvis complete · 0.24mm/px · 13 of 78 slices shown]
[im 1/78]
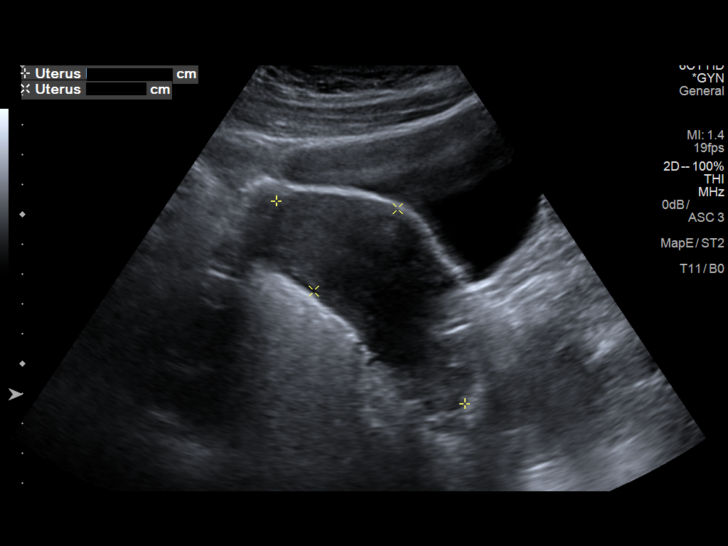
[im 7/78]
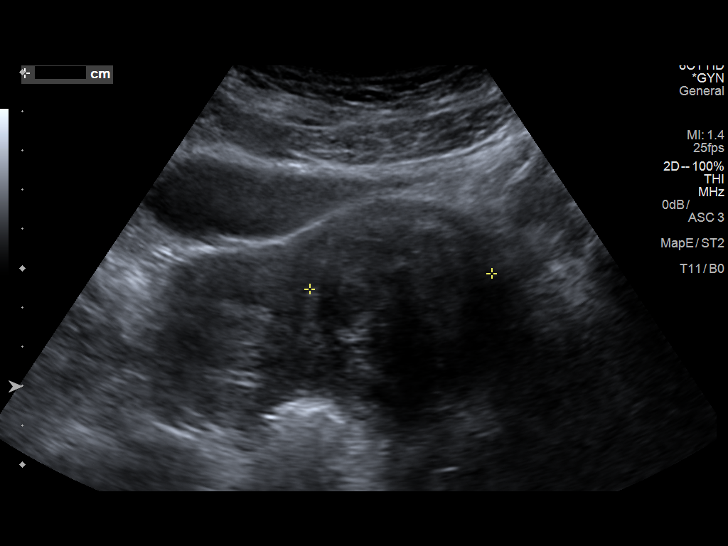
[im 13/78]
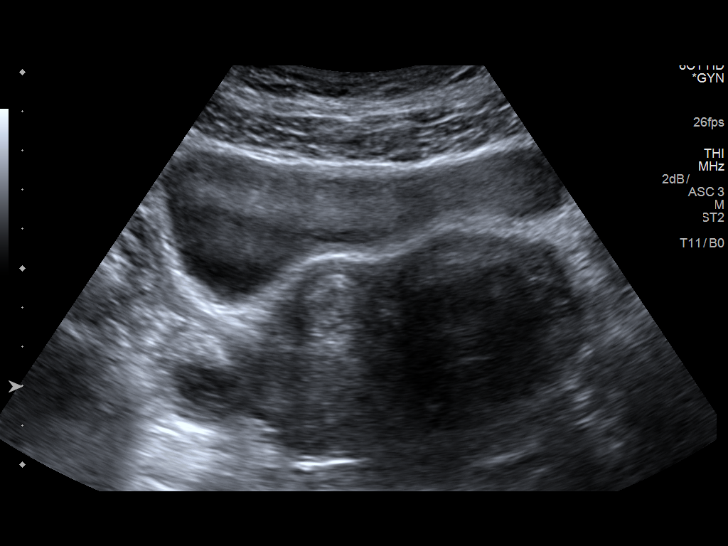
[im 20/78]
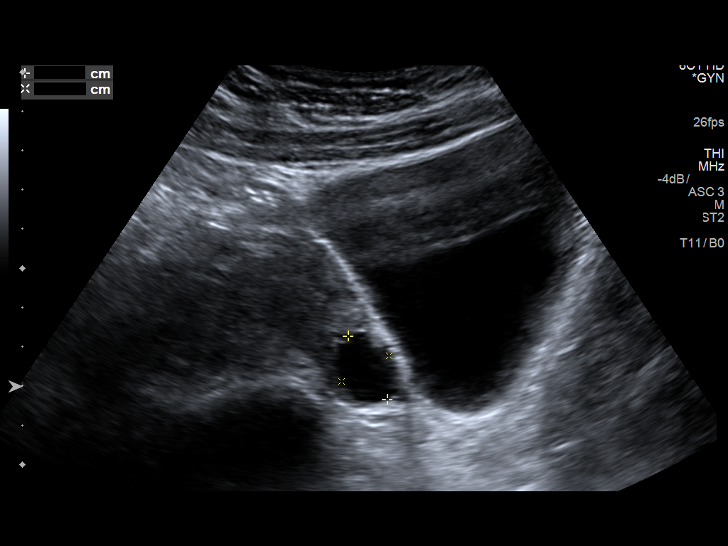
[im 26/78]
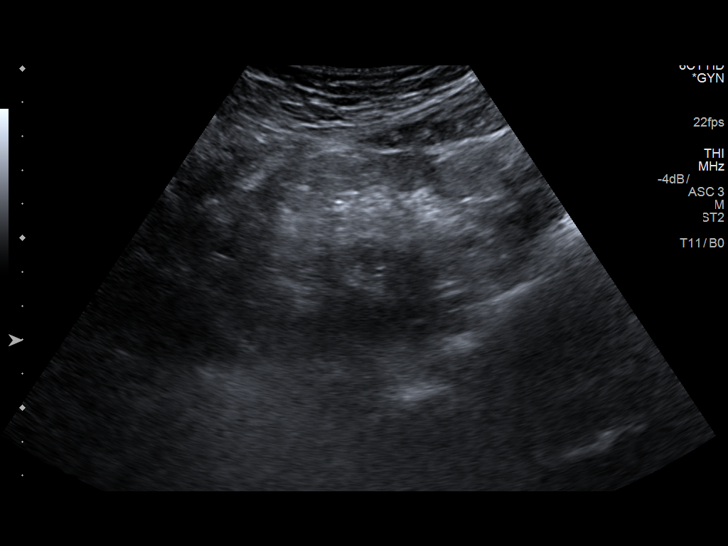
[im 33/78]
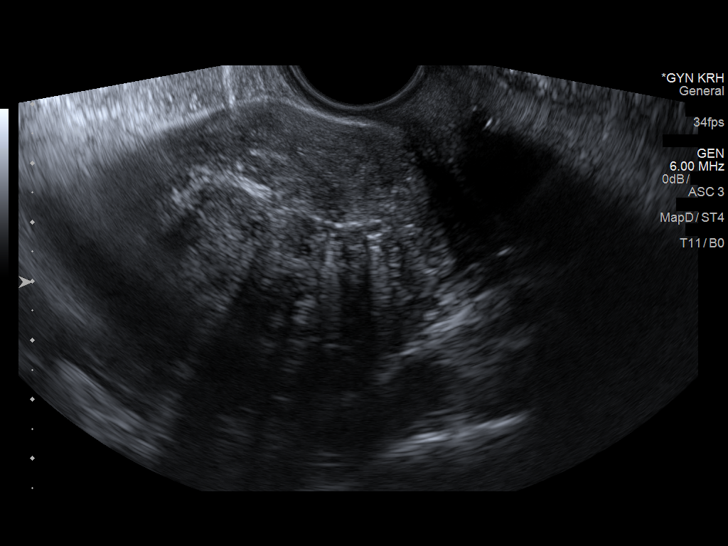
[im 39/78]
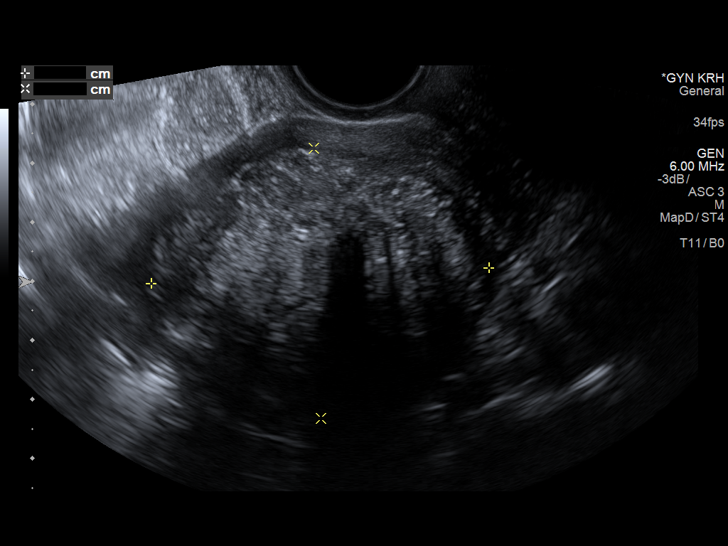
[im 45/78]
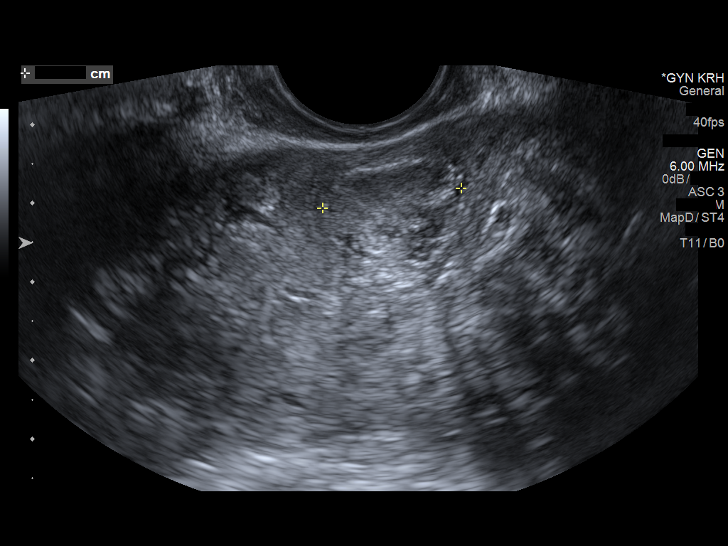
[im 52/78]
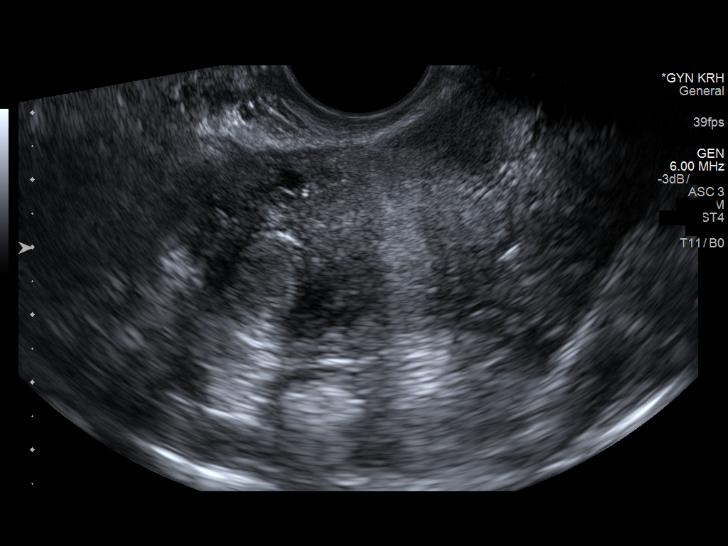
[im 58/78]
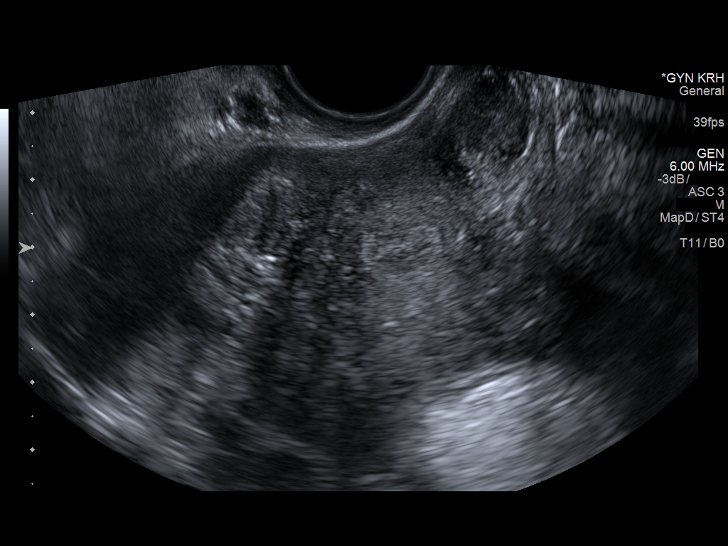
[im 65/78]
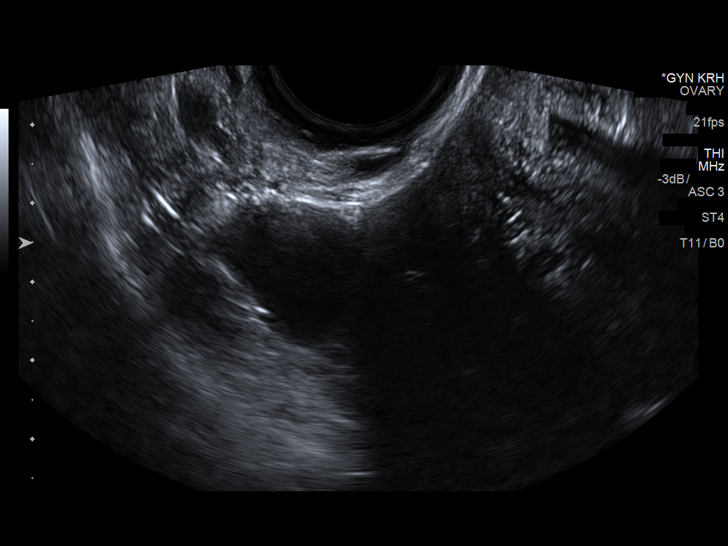
[im 71/78]
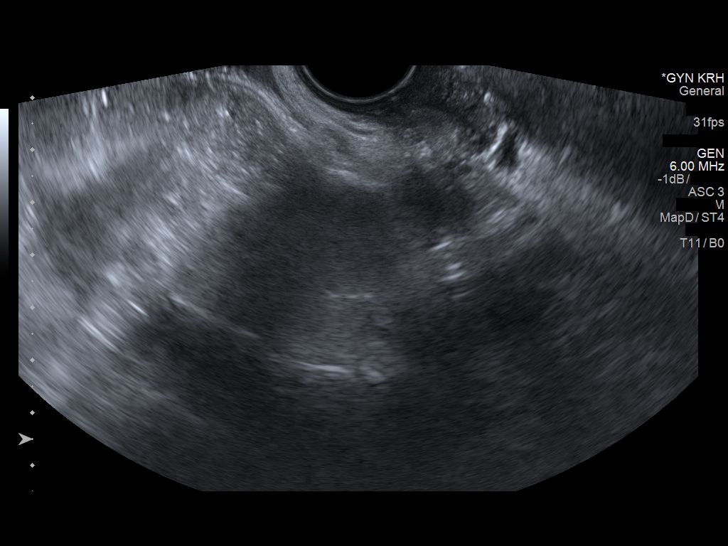
[im 78/78]
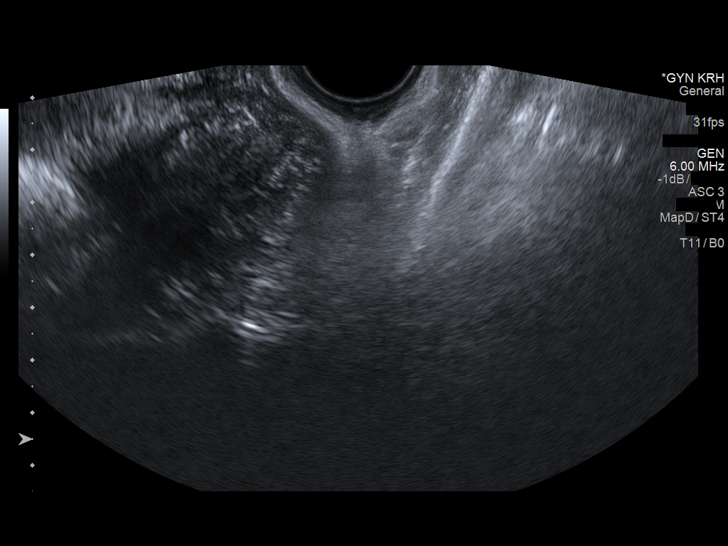

[13 of 25 positions shown; findings below may reference images not displayed]

FINDINGS: Uterus

Measurements: 9.3 x 3.9 x 8.5 cm.. Multiple uterine fibroids are
observed. The largest lies posteriorly and to the left in the mid
uterine corpus and measures 5.7 x 5.3 x 4.6 cm. A smaller left-sided
anterior fibroid measures 1.4 x 1.8 x 1.2 cm. An anterior
right-sided fundal fibroid measures 1.8 x 1.9 x 1.8 cm.

Endometrium

Thickness: 7 mm.  No focal abnormality visualized.

Right ovary

Measurements: 2.7 x 2.3 x 1.9 cm.. The echotexture is normal. A
dominant follicle measures 1.9 x 1.8 x 1.6 cm.

Left ovary

The left ovary could not be visualized.

Other findings

No abnormal free fluid.
IMPRESSION: 1. Multiple uterine fibroids with the largest lying on the left
laterally measuring 5.7 cm in greatest dimension.
2. Normal appearance of the right ovary. The left ovary could not be
visualized.

## 2017-05-01 NOTE — Progress Notes (Signed)
Cardiology Office Note   Date:  05/03/2017   ID:  Ramyah, Pankowski 1970/12/31, MRN [id number]  PCP:  Carollee Herter, Alferd Apa, DO  Cardiologist:   Minus Breeding, MD  Referring:  Carollee Herter, Alferd Apa, DO  Chief Complaint  Patient presents with  . Tachycardia      History of Present Illness: Emily Maddox is a 46 y.o. female who is referred by Carollee Herter, Alferd Apa, DO for evaluation of tachycardia.   She wore a monitor that demonstrated no significant arrhythmia.  She did have an echo in 2012 that demonstrated no significant findings.    She reports that she has always had an increased heart rate.  She reports that after resting in 90s to 100 range and maybe up to 110. She has episodes where it feels like a "adrenaline rush".  She doesn't have presyncope or syncope. She rides an exercise bicycle routinely and she doesn't have any symptoms with this. She will rarely notices skipping heartbeats. Rather what she describes is more just a rapid rate. She denies any chest discomfort, neck or arm discomfort. She's had no shortness of breath, PND or orthopnea. She does have chronic back pain. She's not tolerate beta blockers as it makes her BP drop.  She remembers being on Cardizem at one point but thinks that this might have caused some dizziness.  Of note her thyroid studies and other labs were OK.  In the office she was not orthostatic and she has not had these symptoms in the past.     Past Medical History:  Diagnosis Date  . 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 Diabetes mellitus   . Hyperlipidemia   . Tachycardia     Past Surgical History:  Procedure Laterality Date  . BREAST EXCISIONAL BIOPSY Left 2003     Current Outpatient Prescriptions  Medication Sig Dispense Refill  . HYDROcodone-acetaminophen (NORCO) 10-325 MG tablet Take 1 tablet by mouth every 6 (six) hours as needed. 60 tablet 0  . omeprazole (PRILOSEC) 20 MG  capsule Take 1 capsule (20 mg total) by mouth daily as needed (acid reflux). 90 capsule 3  . Probiotic Product (PROBIOTIC DAILY PO) Take 1 capsule by mouth daily.    Marland Kitchen ACCU-CHEK FASTCLIX LANCETS MISC CHECK BLOOD SUGAR DAILY. Dx. E11.9 102 each 11  . diltiazem (CARDIZEM) 30 MG tablet Take 1 tablet (30 mg total) by mouth 2 (two) times daily. 60 tablet 11  . glucose blood (ACCU-CHEK AVIVA PLUS) test strip Check blood sugar once daily. Dx:E11.9 100 each 5  . simvastatin (ZOCOR) 20 MG tablet Take 1 tablet (20 mg total) by mouth daily. 30 tablet 2   No current facility-administered medications for this visit.     Allergies:   Metoprolol and Ultram [tramadol]    Social History:  The patient  reports that she has been smoking Cigarettes.  She has a 19.00 pack-year smoking history. She has never used smokeless tobacco. She reports that she does not drink alcohol or use drugs.   Family History:  The patient's family history includes Breast cancer (age of onset: 39) in her mother; Cancer (age of onset: 28) in her father; Cancer (age of onset: 106) in her mother; Diabetes in her father, mother, and sister; Heart disease in her father; Hyperlipidemia in her father and mother; Hypertension in her father and mother.    ROS:  Please see the history of present illness.  Otherwise, review of systems are positive for none.   All other systems are reviewed and negative.    PHYSICAL EXAM: VS:  BP 130/88   Pulse 100   Ht 5\' 6"  (1.676 m)   Wt 156 lb (70.8 kg)   BMI 25.18 kg/m  , BMI Body mass index is 25.18 kg/m. GENERAL:  Well appearing HEENT:  Pupils equal round and reactive, fundi not visualized, oral mucosa unremarkable NECK:  No jugular venous distention, waveform within normal limits, carotid upstroke brisk and symmetric, no bruits, no thyromegaly LYMPHATICS:  No cervical, inguinal adenopathy LUNGS:  Clear to auscultation bilaterally BACK:  No CVA tenderness CHEST:  Unremarkable HEART:  PMI not  displaced or sustained,S1 and S2 within normal limits, no S3, no S4, no clicks, no rubs, no murmurs ABD:  Flat, positive bowel sounds normal in frequency in pitch, no bruits, no rebound, no guarding, no midline pulsatile mass, no hepatomegaly, no splenomegaly EXT:  2 plus pulses throughout, no edema, no cyanosis no clubbing SKIN:  No rashes no nodules NEURO:  Cranial nerves II through XII grossly intact, motor grossly intact throughout PSYCH:  Cognitively intact, oriented to person place and time    EKG:  EKG is not ordered today. The ekg ordered 03/06/17 demonstrates sinus rhythm, rate 79, axis within normal limits, intervals within normal limits, no acute ST-T wave changes.   Recent Labs: 03/06/2017: ALT 7; BUN 10; Creatinine, Ser 0.92; Hemoglobin 13.9; Platelets 276.0; Potassium 4.4; Sodium 138; TSH 1.02    Lipid Panel    Component Value Date/Time   CHOL 221 (H) 03/06/2017 1104   TRIG 57.0 03/06/2017 1104   HDL 63.60 03/06/2017 1104   CHOLHDL 3 03/06/2017 1104   VLDL 11.4 03/06/2017 1104   LDLCALC 146 (H) 03/06/2017 1104   LDLDIRECT 143.7 11/12/2013 0813      Wt Readings from Last 3 Encounters:  05/02/17 156 lb (70.8 kg)  03/06/17 154 lb 12.8 oz (70.2 kg)  08/29/16 166 lb 12.8 oz (75.7 kg)      Other studies Reviewed: Additional studies/ records that were reviewed today include: Echo and event monitor.. Review of the above records demonstrates:  Please see elsewhere in the note.     ASSESSMENT AND PLAN:  TACHYCARDIA:   She seems to have sinus tachycardia. This is mildly symptomatic. However, she's been sensitive to medications. I'm going to try this to divided doses of low-dose Cardizem short acting to be taken only during the day.  We could consider Corlanor in the future although this is not approved for this situation and really is used for patients who have significant symptoms.  Prior to doing this I would require her to be completely off of caffeine. I didn't  actually just thought about   Current medicines are reviewed at length with the patient today.  The patient does not have concerns regarding medicines.  The following changes have been made:  no change  Labs/ tests ordered today include:  No orders of the defined types were placed in this encounter.    Disposition:   FU with me as needed.     Signed, Minus Breeding, MD  05/03/2017 1:25 AM    [place]

## 2017-05-02 ENCOUNTER — Encounter: Payer: Self-pay | Admitting: Cardiology

## 2017-05-02 ENCOUNTER — Ambulatory Visit (INDEPENDENT_AMBULATORY_CARE_PROVIDER_SITE_OTHER): Payer: Medicare Other | Admitting: Cardiology

## 2017-05-02 VITALS — BP 130/88 | HR 100 | Ht 66.0 in | Wt 156.0 lb

## 2017-05-02 DIAGNOSIS — R000 Tachycardia, unspecified: Secondary | ICD-10-CM

## 2017-05-02 MED ORDER — DILTIAZEM HCL 30 MG PO TABS
30.0000 mg | ORAL_TABLET | Freq: Two times a day (BID) | ORAL | 11 refills | Status: DC
Start: 2017-05-02 — End: 2018-01-28

## 2017-05-02 NOTE — Patient Instructions (Signed)
Medication Instructions:  Please start Diltiazem 30 mg twice a day. Continue all other medications as listed.  Follow-Up: Follow up in 4 months with Dr Percival Spanish in [place]  Thank you for choosing [facility]!!

## 2017-05-03 ENCOUNTER — Encounter: Payer: Self-pay | Admitting: Cardiology

## 2017-05-04 ENCOUNTER — Ambulatory Visit: Payer: Medicare Other | Admitting: Internal Medicine

## 2017-05-07 ENCOUNTER — Telehealth: Payer: Self-pay | Admitting: Family Medicine

## 2017-05-07 MED ORDER — HYDROCODONE-ACETAMINOPHEN 10-325 MG PO TABS: 1.0000 | ORAL_TABLET | Freq: Four times a day (QID) | ORAL | 0 refills | Status: DC | PRN

## 2017-05-07 MED FILL — HYDROCODON-APAP 10-325: 10-325 | 15 days supply | Qty: 60 | Fill #0

## 2017-05-07 NOTE — Telephone Encounter (Signed)
Requesting:   hydrocodone Contract    11/14/2016 UDS   Low risk next is due on 05/14/2017 Last OV    03/06/2017 Last Refill    #60 on  04/04/2017  Please Advise

## 2017-05-07 NOTE — Telephone Encounter (Signed)
Called the patient informed to pickup hardcopy at the front desk at her convenience

## 2017-05-07 NOTE — Telephone Encounter (Signed)
Caller name: [place] Relationship to patient: self Can be reached: [phone]   Reason for call: pt requesting refill on hydrocodone. States she has 1-2 days. Takes 1 1/2-2 pills per day. Please call when RX ready for pick up.

## 2017-05-07 NOTE — Telephone Encounter (Signed)
Refill x1 

## 2017-05-22 ENCOUNTER — Other Ambulatory Visit: Payer: Self-pay | Admitting: *Deleted

## 2017-05-22 MED ORDER — SIMVASTATIN 20 MG PO TABS: 20.0000 mg | ORAL_TABLET | Freq: Every day | ORAL | 0 refills | Status: DC

## 2017-06-06 ENCOUNTER — Telehealth: Payer: Self-pay | Admitting: Family Medicine

## 2017-06-06 DIAGNOSIS — E1169 Type 2 diabetes mellitus with other specified complication: Secondary | ICD-10-CM

## 2017-06-06 DIAGNOSIS — E785 Hyperlipidemia, unspecified: Principal | ICD-10-CM

## 2017-06-06 NOTE — Telephone Encounter (Signed)
Relation to NV:VYXA Call back [place]:  Reason for call:  Patient requesting HYDROcodone-acetaminophen ([place]) 10-325 MG tablet and would like to pick up Rx when she comes in for labs only on Friday, patient would like PCP to add on A1C orders , please advise

## 2017-06-07 MED ORDER — HYDROCODONE-ACETAMINOPHEN 10-325 MG PO TABS: 1.0000 | ORAL_TABLET | Freq: Four times a day (QID) | ORAL | 0 refills | Status: DC | PRN

## 2017-06-07 NOTE — Telephone Encounter (Signed)
Ok to refill May need datatbase If not done in last 3 months

## 2017-06-07 NOTE — Telephone Encounter (Signed)
Last ov 03/06/17 Last refill 05/07/17 #60 0. Last UDS and signed contract 11/14/16 Patient need UDS Please advise. LB

## 2017-06-07 NOTE — Telephone Encounter (Signed)
Last ov 03/06/17 Last refill 05/07/17 #60 0. Last UDS and signed contract 11/14/16 Patient need UDS

## 2017-06-07 NOTE — Telephone Encounter (Signed)
Patient notified that rx is ready for pick up

## 2017-06-07 NOTE — Telephone Encounter (Signed)
Checked and data base done on 03/2017 Printed/PCP signed and informed the patient ready for pickup at her convenience.

## 2017-06-08 ENCOUNTER — Other Ambulatory Visit (INDEPENDENT_AMBULATORY_CARE_PROVIDER_SITE_OTHER): Payer: Medicare Other

## 2017-06-08 DIAGNOSIS — E1169 Type 2 diabetes mellitus with other specified complication: Secondary | ICD-10-CM

## 2017-06-08 DIAGNOSIS — E785 Hyperlipidemia, unspecified: Secondary | ICD-10-CM | POA: Diagnosis not present

## 2017-06-08 LAB — LIPID PANEL
CHOLESTEROL: 168 mg/dL (ref 0–200)
HDL: 62.4 mg/dL (ref >39.00)
LDL CALC: 95 mg/dL (ref 0–99)
NonHDL: 105.5
TRIGLYCERIDES: 54 mg/dL (ref 0.0–149.0)
Total CHOL/HDL Ratio: 3
VLDL: 10.8 mg/dL (ref 0.0–40.0)

## 2017-06-08 LAB — COMPREHENSIVE METABOLIC PANEL
ALBUMIN: 3.8 g/dL (ref 3.5–5.2)
ALT: 8 U/L (ref 0–35)
AST: 14 U/L (ref 0–37)
Alkaline Phosphatase: 38 U/L — ABNORMAL LOW (ref 39–117)
BILIRUBIN TOTAL: 0.5 mg/dL (ref 0.2–1.2)
BUN: 9 mg/dL (ref 6–23)
CALCIUM: 8.9 mg/dL (ref 8.4–10.5)
CO2: 25 mEq/L (ref 19–32)
CREATININE: 0.85 mg/dL (ref 0.40–1.20)
Chloride: 107 mEq/L (ref 96–112)
GFR: 93.1 mL/min (ref >60.00)
Glucose, Bld: 104 mg/dL — ABNORMAL HIGH (ref 70–99)
Potassium: 4.1 mEq/L (ref 3.5–5.1)
SODIUM: 137 meq/L (ref 135–145)
TOTAL PROTEIN: 6.5 g/dL (ref 6.0–8.3)

## 2017-06-08 LAB — HEMOGLOBIN A1C: HEMOGLOBIN A1C: 6 % (ref 4.6–6.5)

## 2017-06-08 MED FILL — HYDROCODON-APAP 10-325: 10-325 | 15 days supply | Qty: 60 | Fill #0

## 2017-06-20 ENCOUNTER — Telehealth: Payer: Self-pay | Admitting: *Deleted

## 2017-06-20 NOTE — Telephone Encounter (Signed)
Received Application for Renewal of Disability Parking Placard, completed as much as possible; forwarded to provider, will mail completed form to patient per her written request/SLS 08/29

## 2017-06-22 NOTE — Telephone Encounter (Signed)
LMOM with contact name and number for return call, if needed RE: Renewal of Disability Parking Placard and that we must have her signature on form [she did not sign in applicant section], for our copy that is sent to scan. Informed that she will need to come in and sign form and we cannot mail d/t the missing signature/SLS 08/31

## 2017-06-22 NOTE — Telephone Encounter (Signed)
Patient came in to office and signed form; made copy for our charts and give original back to patient/SLS 08/31

## 2017-07-05 ENCOUNTER — Telehealth: Payer: Self-pay | Admitting: Family Medicine

## 2017-07-05 NOTE — Telephone Encounter (Signed)
Need database 

## 2017-07-05 NOTE — Telephone Encounter (Signed)
Self.    Refill request for HYDROcodone   CB; [phone]

## 2017-07-05 NOTE — Telephone Encounter (Signed)
Last UDS 10/2016 Last filled 06/07/17 Qty: 60 Rf 0  Last ov 03/06/17

## 2017-07-09 NOTE — Telephone Encounter (Signed)
I am unable to sign up for data base as new enrollment is not open again until Wednesday

## 2017-07-11 NOTE — Telephone Encounter (Signed)
Patient checking on the status of message below, please advise when rX is ready for pick up, patient is completely out, informed patient PCP is out of the office  Best # [phone]

## 2017-07-12 NOTE — Telephone Encounter (Signed)
Attempted to reach pt to apologize for the delay and update her

## 2017-07-12 NOTE — Telephone Encounter (Signed)
Pt returned call. Made her aware of what the call was about. She said no problem but would like to pick up as soon as possible.

## 2017-07-12 NOTE — Telephone Encounter (Signed)
Database printed, placed on ledge.  

## 2017-07-12 NOTE — Telephone Encounter (Signed)
Will try to get database today, unable to sign up

## 2017-07-12 NOTE — Telephone Encounter (Signed)
Spoke with pt who wanted an ov as soon as possible. Dr. Etter Sjogren had an opening tomorrow and pt wanted that appt. Will hold off on printing rx since she is coming in tomorrow.

## 2017-07-12 NOTE — Telephone Encounter (Signed)
Refill x1  --- inform her that we will need to have her come in q3 months and then at each visit I can give her  3 rx   Indication for chronic opioid: chronic pain Medication and dose: hydrocodone 5/325   # pills per month: 60 Last UDS date: not due Pain contract signed (Y/N): y Date narcotic database last reviewed (include red flags): 07/12/2017

## 2017-07-12 NOTE — Telephone Encounter (Signed)
Patient calling back checking on the status of message below, please advise  ° ° ° ° ° ° °

## 2017-07-12 NOTE — Telephone Encounter (Signed)
database 

## 2017-07-13 ENCOUNTER — Encounter: Payer: Self-pay | Admitting: Family Medicine

## 2017-07-13 ENCOUNTER — Ambulatory Visit (INDEPENDENT_AMBULATORY_CARE_PROVIDER_SITE_OTHER): Payer: Medicare Other | Admitting: Family Medicine

## 2017-07-13 VITALS — BP 108/62 | HR 111 | Temp 98.3°F | Ht 66.0 in | Wt 111.0 lb

## 2017-07-13 DIAGNOSIS — G8929 Other chronic pain: Secondary | ICD-10-CM

## 2017-07-13 DIAGNOSIS — B351 Tinea unguium: Secondary | ICD-10-CM | POA: Diagnosis not present

## 2017-07-13 MED ORDER — HYDROCODONE-ACETAMINOPHEN 10-325 MG PO TABS: 1.0000 | ORAL_TABLET | Freq: Three times a day (TID) | ORAL | 0 refills | Status: DC | PRN

## 2017-07-13 MED ORDER — CICLOPIROX 8 % EX SOLN: Freq: Every day | CUTANEOUS | 0 refills | Status: DC

## 2017-07-13 MED FILL — CICLOPIROX 8% SOLUTION: 8 | 30 days supply | Qty: 7 | Fill #0

## 2017-07-13 MED FILL — HYDROCODON-APAP 10-325: 10-325 | 20 days supply | Qty: 60 | Fill #0

## 2017-07-13 NOTE — Progress Notes (Signed)
Patient ID: Emily Maddox, female    DOB: 10-28-1970  Age: 46 y.o. MRN: [id number]    Subjective:  Subjective  HPI AYLAH YEARY presents for f/u pain meds.  Pt c/o pruning of fingers but its not doing it so bad today.  She does not have her hands in water for long periods.   She also has thickening of middle finger nail on r hand  Review of Systems  Constitutional: Negative for appetite change, diaphoresis, fatigue and unexpected weight change.  Eyes: Negative for pain, redness and visual disturbance.  Respiratory: Negative for cough, chest tightness, shortness of breath and wheezing.   Cardiovascular: Negative for chest pain, palpitations and leg swelling.  Endocrine: Negative for cold intolerance, heat intolerance, polydipsia, polyphagia and polyuria.  Genitourinary: Negative for difficulty urinating, dysuria and frequency.  Musculoskeletal: Positive for arthralgias, back pain and myalgias.  Neurological: Negative for dizziness, light-headedness, numbness and headaches.    History Past Medical History:  Diagnosis Date  . 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 Diabetes mellitus   . Hyperlipidemia   . Tachycardia     She has a past surgical history that includes Breast excisional biopsy (Left, 2003).   Her family history includes Breast cancer in her unknown relative; Breast cancer (age of onset: 66) in her mother; Cancer (age of onset: 84) in her father; Cancer (age of onset: 78) in her mother; Colon cancer in her unknown relative; Diabetes in her father, mother, and sister; Heart disease in her father; Hyperlipidemia in her father and mother; Hypertension in her father and mother.She reports that she has been smoking Cigarettes.  She has a 19.00 pack-year smoking history. She has never used smokeless tobacco. She reports that she does not drink alcohol or use drugs.  Current Outpatient Prescriptions on File Prior to Visit    Medication Sig Dispense Refill  . ACCU-CHEK FASTCLIX LANCETS MISC CHECK BLOOD SUGAR DAILY. Dx. E11.9 102 each 11  . glucose blood (ACCU-CHEK AVIVA PLUS) test strip Check blood sugar once daily. Dx:E11.9 100 each 5  . HYDROcodone-acetaminophen (NORCO) 10-325 MG tablet Take 1 tablet by mouth every 6 (six) hours as needed. 60 tablet 0  . Probiotic Product (PROBIOTIC DAILY PO) Take 1 capsule by mouth daily.    . simvastatin (ZOCOR) 20 MG tablet Take 1 tablet (20 mg total) by mouth daily. 90 tablet 0  . diltiazem (CARDIZEM) 30 MG tablet Take 1 tablet (30 mg total) by mouth 2 (two) times daily. (Patient not taking: Reported on 07/13/2017) 60 tablet 11   No current facility-administered medications on file prior to visit.      Objective:  Objective  Physical Exam  Constitutional: She is oriented to person, place, and time. She appears well-developed and well-nourished.  HENT:  Head: Normocephalic and atraumatic.  Eyes: Conjunctivae and EOM are normal.  Neck: Normal range of motion. Neck supple. No JVD present. Carotid bruit is not present. No thyromegaly present.  Cardiovascular: Normal rate, regular rhythm and normal heart sounds.   No murmur heard. Pulmonary/Chest: Effort normal and breath sounds normal. No respiratory distress. She has no wheezes. She has no rales. She exhibits no tenderness.  Musculoskeletal: She exhibits no edema.  Neurological: She is alert and oriented to person, place, and time.  Skin:  Nail on middle finger on r hand-- thick and yellow  Psychiatric: She has a normal mood and affect.  Nursing note and vitals reviewed.  BP 108/62 (BP  Location: Right Arm, Patient Position: Sitting)   Pulse (!) 111   Temp 98.3 F (36.8 C) (Oral)   Ht 5\' 6"  (1.676 m)   Wt 111 lb (50.3 kg)   LMP 07/07/2017   SpO2 98%   BMI 17.92 kg/m  Wt Readings from Last 3 Encounters:  07/13/17 111 lb (50.3 kg)  05/02/17 156 lb (70.8 kg)  03/06/17 154 lb 12.8 oz (70.2 kg)     Lab Results   Component Value Date   WBC 7.2 03/06/2017   HGB 13.9 03/06/2017   HCT 41.8 03/06/2017   PLT 276.0 03/06/2017   GLUCOSE 104 (H) 06/08/2017   CHOL 168 06/08/2017   TRIG 54.0 06/08/2017   HDL 62.40 06/08/2017   LDLDIRECT 143.7 11/12/2013   LDLCALC 95 06/08/2017   ALT 8 06/08/2017   AST 14 06/08/2017   NA 137 06/08/2017   K 4.1 06/08/2017   CL 107 06/08/2017   CREATININE 0.85 06/08/2017   BUN 9 06/08/2017   CO2 25 06/08/2017   TSH 1.02 03/06/2017   HGBA1C 6.0 06/08/2017   MICROALBUR <0.7 08/29/2016    Mm Screening Breast Tomo Bilateral  Result Date: 03/06/2017 CLINICAL DATA:  Screening. EXAM: 2D DIGITAL SCREENING BILATERAL MAMMOGRAM WITH CAD AND ADJUNCT TOMO COMPARISON:  Previous exam(s). ACR Breast Density Category b: There are scattered areas of fibroglandular density. FINDINGS: There are no findings suspicious for malignancy. Images were processed with CAD. IMPRESSION: No mammographic evidence of malignancy. A result letter of this screening mammogram will be mailed directly to the patient. RECOMMENDATION: Screening mammogram in one year. (Code:SM-B-01Y) BI-RADS CATEGORY  1: Negative. Electronically Signed   By: Everlean Alstrom M.D.   On: 03/06/2017 07:41     Assessment & Plan:  Plan  I have discontinued Ms. Agudelo omeprazole. I am also having her start on HYDROcodone-acetaminophen, HYDROcodone-acetaminophen, HYDROcodone-acetaminophen, and ciclopirox. Additionally, I am having her maintain her glucose blood, Probiotic Product (PROBIOTIC DAILY PO), ACCU-CHEK FASTCLIX LANCETS, diltiazem, simvastatin, and HYDROcodone-acetaminophen.  Meds ordered this encounter  Medications  . HYDROcodone-acetaminophen (NORCO) 10-325 MG tablet    Sig: Take 1 tablet by mouth every 8 (eight) hours as needed.    Dispense:  60 tablet    Refill:  0  . HYDROcodone-acetaminophen (NORCO) 10-325 MG tablet    Sig: Take 1 tablet by mouth every 8 (eight) hours as needed.    Dispense:  60 tablet     Refill:  0    Do not fill until Aug 12, 2017  . HYDROcodone-acetaminophen (NORCO) 10-325 MG tablet    Sig: Take 1 tablet by mouth every 8 (eight) hours as needed.    Dispense:  60 tablet    Refill:  0    Do not fill until Nov 21,2018  . ciclopirox (PENLAC) 8 % solution    Sig: Apply topically at bedtime. Apply over nail and surrounding skin. Apply daily over previous coat. After seven (7) days, may remove with alcohol and continue cycle.    Dispense:  6.6 mL    Refill:  0    Problem List Items Addressed This Visit    None    Visit Diagnoses    Other chronic pain    -  Primary   Relevant Medications   HYDROcodone-acetaminophen (NORCO) 10-325 MG tablet   HYDROcodone-acetaminophen (NORCO) 10-325 MG tablet   HYDROcodone-acetaminophen (NORCO) 10-325 MG tablet   Onychomycosis       Relevant Medications   ciclopirox (PENLAC) 8 % solution  Follow-up: Return in about 3 months (around 10/12/2017), or if symptoms worsen or fail to improve.  [name], DO

## 2017-07-13 NOTE — Patient Instructions (Signed)

## 2017-07-18 ENCOUNTER — Telehealth: Payer: Self-pay | Admitting: Family Medicine

## 2017-07-18 NOTE — Telephone Encounter (Addendum)
Pt states info provided to [employer] [place] was incorrect. Pt states not uncontrolled diabetes not insulin dependant and not taking diabetes meds. Pt states problem with eye was due to contact lense not diabetes. Pt states also she does not have high BP. Pt would like a copy of what we sent to the ins company so she can point out to [facility] what she sees as incorrect so it can be corrected. Gave pt ph [phone] [facility] medical records dept so she can request copy of her records.

## 2017-07-26 ENCOUNTER — Telehealth: Payer: Self-pay | Admitting: Family Medicine

## 2017-07-26 NOTE — Telephone Encounter (Signed)
KC-Can you plz take a look at this one?

## 2017-07-26 NOTE — Telephone Encounter (Signed)
Called patient to schedule appointment for Tdap. Left message for return call.

## 2017-07-26 NOTE — Telephone Encounter (Signed)
Pt called states she just got a job and needs a Tdap. Please place order and schedule for pt.

## 2017-07-30 ENCOUNTER — Telehealth: Payer: Self-pay | Admitting: Family Medicine

## 2017-07-30 ENCOUNTER — Encounter: Payer: Self-pay | Admitting: Family Medicine

## 2017-07-30 NOTE — Telephone Encounter (Signed)
Relation to DH:RCBU Call back Merkel   Reason for call:  Patient checking on the status if [employer] physical form was received, please advise patient would like to know the status.

## 2017-07-30 NOTE — Telephone Encounter (Signed)
Patient scheduled Tdap with nurse for 08/08/2017

## 2017-07-30 NOTE — Telephone Encounter (Signed)
error:[id number] ° °

## 2017-08-08 ENCOUNTER — Ambulatory Visit (INDEPENDENT_AMBULATORY_CARE_PROVIDER_SITE_OTHER): Payer: Medicare Other

## 2017-08-08 DIAGNOSIS — Z111 Encounter for screening for respiratory tuberculosis: Secondary | ICD-10-CM

## 2017-08-09 NOTE — Telephone Encounter (Signed)
Patient brought in form on 08/08/17 when she came in for Nurse visit; will complete when she returns for PPD placement to be read on Friday [08/10/17]/SLS 10/18

## 2017-08-10 ENCOUNTER — Ambulatory Visit (INDEPENDENT_AMBULATORY_CARE_PROVIDER_SITE_OTHER): Payer: Medicare Other

## 2017-08-10 DIAGNOSIS — Z111 Encounter for screening for respiratory tuberculosis: Secondary | ICD-10-CM | POA: Diagnosis not present

## 2017-08-10 LAB — TB SKIN TEST
INDURATION: 0 mm
TB SKIN TEST: NEGATIVE

## 2017-08-10 NOTE — Telephone Encounter (Signed)
Completed as much as possible on Health Examination Form and attached last CPE note, pt has no record of MMR vaccine and/or titer that I can find. She will be in today to have her PPD read; forwarded to provider/SLS 10/19

## 2017-08-10 NOTE — Progress Notes (Addendum)
Patient in for PPD read. PPD negative. Patient states she does not have to have MMR and Tetanus Titers per her employer.  Faxed documents (copies) to [employer] @ [phone]. Copy sent to patient per her request.  Faxed form to fax number listed. No answer. Tied to call patient unable to leave message on answering machine.    Reviewed---Yvonne R Carollee Herter, DO

## 2017-08-13 MED FILL — HYDROCODON-APAP 10-325: 10-325 | 20 days supply | Qty: 60 | Fill #0

## 2017-08-13 NOTE — Telephone Encounter (Signed)
Forms completed by nurse Santiago Glad and faxed.    I received confirmation of fax.

## 2017-08-14 ENCOUNTER — Encounter: Payer: Self-pay | Admitting: Cardiology

## 2017-08-26 NOTE — Progress Notes (Deleted)
Cardiology Office Note   Date:  08/26/2017   ID:  [name], Emily Maddox 1971/09/05, MRN [id number]  PCP:  Emily Maddox, Alferd Apa, DO  Cardiologist:   Minus Breeding, MD  Referring:  Emily Maddox, Alferd Apa, DO  No chief complaint on file.     History of Present Illness: Emily Maddox is a 46 y.o. female who was referred by Emily Held, DO for evaluation of tachycardia.   She wore a monitor that demonstrated no significant arrhythmia.  She did have an echo in 2012 that demonstrated no significant findings.  She wore a monitor in May that was negative for any rhythm other than sinus and sinus tach.   She returns for follow up.  ***   She reports that she has always had an increased heart rate.  She reports that after resting in 90s to 100 range and maybe up to 110. She has episodes where it feels like a "adrenaline rush".  She doesn't have presyncope or syncope. She rides an exercise bicycle routinely and she doesn't have any symptoms with this. She will rarely notices skipping heartbeats. Rather what she describes is more just a rapid rate. She denies any chest discomfort, neck or arm discomfort. She's had no shortness of breath, PND or orthopnea. She does have chronic back pain. She's not tolerate beta blockers as it makes her BP drop.  She remembers being on Cardizem at one point but thinks that this might have caused some dizziness.  Of note her thyroid studies and other labs were OK.  In the office she was not orthostatic and she has not had these symptoms in the past.     Past Medical History:  Diagnosis Date  . 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 Diabetes mellitus   . Hyperlipidemia   . Tachycardia     Past Surgical History:  Procedure Laterality Date  . BREAST EXCISIONAL BIOPSY Left 2003     Current Outpatient Medications  Medication Sig Dispense Refill  . ACCU-CHEK FASTCLIX LANCETS MISC CHECK BLOOD SUGAR  DAILY. Dx. E11.9 102 each 11  . ciclopirox (PENLAC) 8 % solution Apply topically at bedtime. Apply over nail and surrounding skin. Apply daily over previous coat. After seven (7) days, may remove with alcohol and continue cycle. 6.6 mL 0  . diltiazem (CARDIZEM) 30 MG tablet Take 1 tablet (30 mg total) by mouth 2 (two) times daily. (Patient not taking: Reported on 07/13/2017) 60 tablet 11  . glucose blood (ACCU-CHEK AVIVA PLUS) test strip Check blood sugar once daily. Dx:E11.9 100 each 5  . HYDROcodone-acetaminophen (NORCO) 10-325 MG tablet Take 1 tablet by mouth every 6 (six) hours as needed. 60 tablet 0  . HYDROcodone-acetaminophen (NORCO) 10-325 MG tablet Take 1 tablet by mouth every 8 (eight) hours as needed. 60 tablet 0  . HYDROcodone-acetaminophen (NORCO) 10-325 MG tablet Take 1 tablet by mouth every 8 (eight) hours as needed. 60 tablet 0  . HYDROcodone-acetaminophen (NORCO) 10-325 MG tablet Take 1 tablet by mouth every 8 (eight) hours as needed. 60 tablet 0  . Probiotic Product (PROBIOTIC DAILY PO) Take 1 capsule by mouth daily.    . simvastatin (ZOCOR) 20 MG tablet Take 1 tablet (20 mg total) by mouth daily. 90 tablet 0   No current facility-administered medications for this visit.     Allergies:   Metoprolol and Ultram [tramadol]    ROS:  Please see the history  of present illness.   Otherwise, review of systems are positive for ***.   All other systems are reviewed and negative.    PHYSICAL EXAM: VS:  There were no vitals taken for this visit. , BMI There is no height or weight on file to calculate BMI.  GENERAL:  Well appearing NECK:  No jugular venous distention, waveform within normal limits, carotid upstroke brisk and symmetric, no bruits, no thyromegaly LUNGS:  Clear to auscultation bilaterally CHEST:  Unremarkable HEART:  PMI not displaced or sustained,S1 and S2 within normal limits, no S3, no S4, no clicks, no rubs, *** murmurs ABD:  Flat, positive bowel sounds normal in  frequency in pitch, no bruits, no rebound, no guarding, no midline pulsatile mass, no hepatomegaly, no splenomegaly EXT:  2 plus pulses throughout, no edema, no cyanosis no clubbing    GENERAL:  Well appearing HEENT:  Pupils equal round and reactive, fundi not visualized, oral mucosa unremarkable NECK:  No jugular venous distention, waveform within normal limits, carotid upstroke brisk and symmetric, no bruits, no thyromegaly LYMPHATICS:  No cervical, inguinal adenopathy LUNGS:  Clear to auscultation bilaterally BACK:  No CVA tenderness CHEST:  Unremarkable HEART:  PMI not displaced or sustained,S1 and S2 within normal limits, no S3, no S4, no clicks, no rubs, no murmurs ABD:  Flat, positive bowel sounds normal in frequency in pitch, no bruits, no rebound, no guarding, no midline pulsatile mass, no hepatomegaly, no splenomegaly EXT:  2 plus pulses throughout, no edema, no cyanosis no clubbing SKIN:  No rashes no nodules NEURO:  Cranial nerves II through XII grossly intact, motor grossly intact throughout PSYCH:  Cognitively intact, oriented to person place and time    EKG:  EKG is not *** ordered today. The ekg ordered 03/06/17 demonstrates sinus rhythm, rate *** , axis within normal limits, intervals within normal limits, no acute ST-T wave changes.   Recent Labs: 03/06/2017: Hemoglobin 13.9; Platelets 276.0; TSH 1.02 06/08/2017: ALT 8; BUN 9; Creatinine, Ser 0.85; Potassium 4.1; Sodium 137    Lipid Panel    Component Value Date/Time   CHOL 168 06/08/2017 0920   TRIG 54.0 06/08/2017 0920   HDL 62.40 06/08/2017 0920   CHOLHDL 3 06/08/2017 0920   VLDL 10.8 06/08/2017 0920   LDLCALC 95 06/08/2017 0920   LDLDIRECT 143.7 11/12/2013 0813      Wt Readings from Last 3 Encounters:  07/13/17 111 lb (50.3 kg)  05/02/17 156 lb (70.8 kg)  03/06/17 154 lb 12.8 oz (70.2 kg)      Other studies Reviewed: Additional studies/ records that were reviewed today include: *** Review of the  above records demonstrates:   ***  ASSESSMENT AND PLAN:  TACHYCARDIA:   She seems to have sinus tachycardia.  *** This is mildly symptomatic. However, she's been sensitive to medications. I'm going to try this to divided doses of low-dose Cardizem short acting to be taken only during the day.  We could consider Corlanor in the future although this is not approved for this situation and really is used for patients who have significant symptoms.  Prior to doing this I would require her to be completely off of caffeine. I didn't actually just thought about   Current medicines are reviewed at length with the patient today.  The patient does not have concerns regarding medicines.  The following changes have been made:   ***  Labs/ tests ordered today include:  *** No orders of the defined types were placed in this  encounter.    Disposition:   FU with me ***     Signed, Minus Breeding, MD  08/26/2017 9:13 PM    [facility]

## 2017-08-29 ENCOUNTER — Ambulatory Visit: Payer: Medicare Other | Admitting: Cardiology

## 2017-09-17 MED FILL — HYDROCODON-APAP 10-325: 10-325 | 20 days supply | Qty: 60 | Fill #0

## 2017-10-18 ENCOUNTER — Telehealth: Payer: Self-pay | Admitting: Family Medicine

## 2017-10-18 ENCOUNTER — Encounter: Payer: Self-pay | Admitting: Family Medicine

## 2017-10-18 MED ORDER — HYDROCODONE-ACETAMINOPHEN 10-325 MG PO TABS: 1.0000 | ORAL_TABLET | Freq: Four times a day (QID) | ORAL | 0 refills | Status: DC | PRN

## 2017-10-18 NOTE — Telephone Encounter (Signed)
Patient requesting refill for hydrocodone  Database printed  Last filled per database: 09/17/17 Last written: 07/13/17 for 3 rxs Last ov: 03/06/17 Next ov: due now Contract: 11/14/16 UDS: due now

## 2017-10-18 NOTE — Telephone Encounter (Signed)
Copied from [place] [id number]. Topic: Quick Communication - Rx Refill/Question >> Oct 18, 2017 10:01 AM Scherrie Gerlach wrote: Pt request refill HYDROcodone-acetaminophen (NORCO) 10-325 MG tablet

## 2017-10-29 ENCOUNTER — Encounter: Payer: Self-pay | Admitting: Family Medicine

## 2017-10-29 ENCOUNTER — Ambulatory Visit (INDEPENDENT_AMBULATORY_CARE_PROVIDER_SITE_OTHER): Payer: Medicare Other | Admitting: Family Medicine

## 2017-10-29 DIAGNOSIS — G894 Chronic pain syndrome: Secondary | ICD-10-CM

## 2017-10-29 DIAGNOSIS — E785 Hyperlipidemia, unspecified: Secondary | ICD-10-CM

## 2017-10-29 DIAGNOSIS — M5136 Other intervertebral disc degeneration, lumbar region: Secondary | ICD-10-CM | POA: Diagnosis not present

## 2017-10-29 DIAGNOSIS — G8929 Other chronic pain: Secondary | ICD-10-CM

## 2017-10-29 DIAGNOSIS — E1149 Type 2 diabetes mellitus with other diabetic neurological complication: Secondary | ICD-10-CM | POA: Diagnosis not present

## 2017-10-29 DIAGNOSIS — Z79899 Other long term (current) drug therapy: Secondary | ICD-10-CM | POA: Diagnosis not present

## 2017-10-29 DIAGNOSIS — F17200 Nicotine dependence, unspecified, uncomplicated: Secondary | ICD-10-CM | POA: Diagnosis not present

## 2017-10-29 LAB — MICROALBUMIN / CREATININE URINE RATIO
Creatinine,U: 223 mg/dL
Microalb Creat Ratio: 0.3 mg/g (ref 0.0–30.0)

## 2017-10-29 LAB — COMPREHENSIVE METABOLIC PANEL
ALBUMIN: 4 g/dL (ref 3.5–5.2)
ALK PHOS: 41 U/L (ref 39–117)
ALT: 6 U/L (ref 0–35)
AST: 12 U/L (ref 0–37)
BILIRUBIN TOTAL: 0.4 mg/dL (ref 0.2–1.2)
BUN: 9 mg/dL (ref 6–23)
CALCIUM: 8.9 mg/dL (ref 8.4–10.5)
CO2: 26 mEq/L (ref 19–32)
Chloride: 106 mEq/L (ref 96–112)
Creatinine, Ser: 0.87 mg/dL (ref 0.40–1.20)
GFR: 90.47 mL/min (ref >60.00)
GLUCOSE: 96 mg/dL (ref 70–99)
POTASSIUM: 4.4 meq/L (ref 3.5–5.1)
Sodium: 137 mEq/L (ref 135–145)
TOTAL PROTEIN: 6.7 g/dL (ref 6.0–8.3)

## 2017-10-29 LAB — LIPID PANEL
CHOLESTEROL: 172 mg/dL (ref 0–200)
HDL: 61 mg/dL (ref >39.00)
LDL Cholesterol: 100 mg/dL — ABNORMAL HIGH (ref 0–99)
NONHDL: 110.63
Total CHOL/HDL Ratio: 3
Triglycerides: 54 mg/dL (ref 0.0–149.0)
VLDL: 10.8 mg/dL (ref 0.0–40.0)

## 2017-10-29 LAB — HEMOGLOBIN A1C: HEMOGLOBIN A1C: 6 % (ref 4.6–6.5)

## 2017-10-29 MED ORDER — HYDROCODONE-ACETAMINOPHEN 10-325 MG PO TABS: 1.0000 | ORAL_TABLET | Freq: Three times a day (TID) | ORAL | 0 refills | Status: DC | PRN

## 2017-10-29 MED ORDER — GLUCOSE BLOOD VI STRP: ORAL_STRIP | 5 refills | Status: DC

## 2017-10-29 NOTE — Assessment & Plan Note (Signed)
hgba1c to be done, minimize simple carbs. Increase exercise as tolerated. Continue current meds  

## 2017-10-29 NOTE — Assessment & Plan Note (Signed)
con't pain meds Database reviewed  Offered pain management due to pain worsening some but she refused at this time due to bad experience with place she went several years ago

## 2017-10-29 NOTE — Patient Instructions (Signed)
Health Risks of Smoking  Smoking cigarettes is very bad for your health. Tobacco smoke has over 200 known poisons in it. It contains the poisonous gases nitrogen oxide and carbon monoxide. There are over 60 chemicals in tobacco smoke that cause cancer.  Smoking is difficult to quit because a chemical in tobacco, called nicotine, causes addiction or dependence. When you smoke and inhale, nicotine is absorbed rapidly into the bloodstream through your lungs. Both inhaled and non-inhaled nicotine may be addictive.  What are the risks of cigarette smoke?  Cigarette smokers have an increased risk of many serious medical problems, including:  · Lung cancer.  · Lung disease, such as pneumonia, bronchitis, and emphysema.  · Chest pain (angina) and heart attack because the heart is not getting enough oxygen.  · Heart disease and peripheral blood vessel disease.  · High blood pressure (hypertension).  · Stroke.  · Oral cancer, including cancer of the lip, mouth, or voice box.  · Bladder cancer.  · Pancreatic cancer.  · Cervical cancer.  · Pregnancy complications, including premature birth.  · Stillbirths and smaller newborn babies, birth defects, and genetic damage to sperm.  · Early menopause.  · Lower estrogen level for women.  · Infertility.  · Facial wrinkles.  · Blindness.  · Increased risk of broken bones (fractures).  · Senile dementia.  · Stomach ulcers and internal bleeding.  · Delayed wound healing and increased risk of complications during surgery.  · Even smoking lightly shortens your life expectancy by several years.    Because of secondhand smoke exposure, children of smokers have an increased risk of the following:  · Sudden infant death syndrome (SIDS).  · Respiratory infections.  · Lung cancer.  · Heart disease.  · Ear infections.    What are the benefits of quitting?  There are many health benefits of quitting smoking. Here are some of them:   · Within days of quitting smoking, your risk of having a heart attack decreases, your blood flow improves, and your lung capacity improves. Blood pressure, pulse rate, and breathing patterns start returning to normal soon after quitting.  · Within months, your lungs may clear up completely.  · Quitting for 10 years reduces your risk of developing lung cancer and heart disease to almost that of a nonsmoker.  · People who quit may see an improvement in their overall quality of life.    How do I quit smoking?  Smoking is an addiction with both physical and psychological effects, and longtime habits can be hard to change. Your health care provider can recommend:  · Programs and community resources, which may include group support, education, or talk therapy.  · Prescription medicines to help reduce cravings.  · Nicotine replacement products, such as patches, gum, and nasal sprays. Use these products only as directed. Do not replace cigarette smoking with electronic cigarettes, which are commonly called e-cigarettes. The safety of e-cigarettes is not known, and some may contain harmful chemicals.  · A combination of two or more of these methods.    Where to find more information:  · American Lung Association: www.lung.org  · American Cancer Society: www.cancer.org  Summary  · Smoking cigarettes is very bad for your health. Cigarette smokers have an increased risk of many serious medical problems, including several cancers, heart disease, and stroke.  · Smoking is an addiction with both physical and psychological effects, and longtime habits can be hard to change.  · By stopping right away, you   can greatly reduce the risk of medical problems for you and your family.  · To help you quit smoking, your health care provider can recommend programs, community resources, prescription medicines, and nicotine replacement products such as patches, gum, and nasal sprays.   This information is not intended to replace advice given to you by your health care provider. Make sure you discuss any questions you have with your health care provider.  Document Released: 11/16/2004 Document Revised: 10/13/2016 Document Reviewed: 10/13/2016  Elsevier Interactive Patient Education © 2017 [employer]

## 2017-10-29 NOTE — Assessment & Plan Note (Signed)
Tolerating statin, encouraged heart healthy diet, avoid trans fats, minimize simple carbs and saturated fats. Increase exercise as tolerated 

## 2017-10-29 NOTE — Progress Notes (Signed)
Subjective:  I acted as a Education administrator for Dr. Carollee Herter.  Guerry Bruin, [place]   Patient ID: Emily Maddox, female    DOB: Jan 05, 1971, 47 y.o.   MRN: [id number]  Chief Complaint  Patient presents with  . chronic pain syndrome  . Hyperlipidemia  . Diabetes    HPI  Patient is in today for follow up pain medication, diabetes, and cholesterol.  She is doing well on current treatment.  HYPERTENSION   Blood pressure range-not checking   Chest pain- no      Dyspnea- no Lightheadedness- no   Edema- no  Other side effects - no   Medication compliance: good Low salt diet- yes    DIABETES    Blood Sugar ranges-good per pt  Polyuria- o New Visual problems-no  Hypoglycemic symptoms- no  Other side effects-no Medication compliance - good Last eye exam- 4 months ago Foot exam- today   HYPERLIPIDEMIA  Medication compliance- good RUQ pain- no  Muscle aches- no Other side effects-no   Patient Care Team: [name], DO as PCP - General Juanita Craver, MD as Consulting Physician (Gastroenterology) Linda Hedges, DO as Consulting Physician (Obstetrics and Gynecology)   Past Medical History:  Diagnosis Date  . 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 Diabetes mellitus   . Hyperlipidemia   . Tachycardia     Past Surgical History:  Procedure Laterality Date  . BREAST EXCISIONAL BIOPSY Left 2003    Family History  Problem Relation Age of Onset  . Cancer Mother 21       breast  . Hypertension Mother   . Hyperlipidemia Mother   . Diabetes Mother   . Breast cancer Mother 54  . Cancer Father 54       colon  . Hypertension Father   . Hyperlipidemia Father   . Heart disease Father        a fib  . Diabetes Father   . Diabetes Sister   . Breast cancer Unknown   . Colon cancer Unknown     Social History   Socioeconomic History  . Marital status: Single    Spouse name: Not on file  . Number of children: Not on file  .  Years of education: Not on file  . Highest education level: Not on file  Social Needs  . Financial resource strain: Not on file  . Food insecurity - worry: Not on file  . Food insecurity - inability: Not on file  . Transportation needs - medical: Not on file  . Transportation needs - non-medical: Not on file  Occupational History    Employer: UNEMPLOYED  Tobacco Use  . Smoking status: Current Every Day Smoker    Packs/day: 1.00    Years: 19.00    Pack years: 19.00    Types: Cigarettes  . Smokeless tobacco: Never Used  . Tobacco comment: couldn't tolerate chantix, cut down to 0.5 pack since 11/2013  Substance and Sexual Activity  . Alcohol use: No  . Drug use: No  . Sexual activity: Yes    Partners: Female  Other Topics Concern  . Not on file  Social History Narrative   Lives alone.     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 in business administration   Exercise--  3-4 x a week    Outpatient Medications Prior to Visit  Medication Sig Dispense Refill  . ACCU-CHEK FASTCLIX LANCETS MISC CHECK BLOOD SUGAR DAILY. Dx. E11.9 102  each 11  . ciclopirox (PENLAC) 8 % solution Apply topically at bedtime. Apply over nail and surrounding skin. Apply daily over previous coat. After seven (7) days, may remove with alcohol and continue cycle. 6.6 mL 0  . diltiazem (CARDIZEM) 30 MG tablet Take 1 tablet (30 mg total) by mouth 2 (two) times daily. (Patient not taking: Reported on 07/13/2017) 60 tablet 11  . Probiotic Product (PROBIOTIC DAILY PO) Take 1 capsule by mouth daily.    . simvastatin (ZOCOR) 20 MG tablet Take 1 tablet (20 mg total) by mouth daily. 90 tablet 0  . glucose blood (ACCU-CHEK AVIVA PLUS) test strip Check blood sugar once daily. Dx:E11.9 100 each 5  . HYDROcodone-acetaminophen (NORCO) 10-325 MG tablet Take 1 tablet by mouth every 8 (eight) hours as needed. 60 tablet 0  . HYDROcodone-acetaminophen (NORCO) 10-325 MG tablet Take 1 tablet by mouth every 8 (eight) hours as needed. 60 tablet  0  . HYDROcodone-acetaminophen (NORCO) 10-325 MG tablet Take 1 tablet by mouth every 8 (eight) hours as needed. 60 tablet 0  . HYDROcodone-acetaminophen (NORCO) 10-325 MG tablet Take 1 tablet by mouth every 6 (six) hours as needed. 60 tablet 0   No facility-administered medications prior to visit.     Allergies  Allergen Reactions  . Metoprolol Other (See Comments)    exhaustion  . Ultram [Tramadol] Palpitations    Review of Systems  Constitutional: Negative for chills, fever and malaise/fatigue.  HENT: Negative for congestion and hearing loss.   Eyes: Negative for blurred vision and discharge.  Respiratory: Negative for cough, sputum production and shortness of breath.   Cardiovascular: Negative for chest pain, palpitations and leg swelling.  Gastrointestinal: Negative for abdominal pain, blood in stool, constipation, diarrhea, heartburn, nausea and vomiting.  Genitourinary: Negative for dysuria, frequency, hematuria and urgency.  Musculoskeletal: Negative for back pain, falls and myalgias.  Skin: Negative for rash.  Neurological: Negative for dizziness, sensory change, loss of consciousness, weakness and headaches.  Endo/Heme/Allergies: Negative for environmental allergies. Does not bruise/bleed easily.  Psychiatric/Behavioral: Negative for depression and suicidal ideas. The patient is not nervous/anxious and does not have insomnia.        Objective:    Physical Exam  Constitutional: She is oriented to person, place, and time. She appears well-developed and well-nourished.  HENT:  Head: Normocephalic and atraumatic.  Eyes: Conjunctivae and EOM are normal.  Neck: Normal range of motion. Neck supple. No JVD present. Carotid bruit is not present. No thyromegaly present.  Cardiovascular: Normal rate, regular rhythm and normal heart sounds.  No murmur heard. Pulmonary/Chest: Effort normal and breath sounds normal. No respiratory distress. She has no wheezes. She has no rales. She  exhibits no tenderness.  Musculoskeletal: She exhibits no edema.  Neurological: She is alert and oriented to person, place, and time.  Psychiatric: She has a normal mood and affect.  Nursing note and vitals reviewed. Sensory exam of the foot is normal, tested with the monofilament. Good pulses, no lesions or ulcers, good peripheral pulses.   There were no vitals taken for this visit. Wt Readings from Last 3 Encounters:  07/13/17 111 lb (50.3 kg)  05/02/17 156 lb (70.8 kg)  03/06/17 154 lb 12.8 oz (70.2 kg)   BP Readings from Last 3 Encounters:  07/13/17 108/62  05/02/17 130/88  03/06/17 106/78     Immunization History  Administered Date(s) Administered  . Hep A / Hep B 02/03/2009, 04/05/2009, 09/03/2009, 10/31/2010  . PPD Test 08/08/2017  . Td 02/03/2009  Health Maintenance  Topic Date Due  . PAP SMEAR  07/27/2017  . URINE MICROALBUMIN  08/29/2017  . INFLUENZA VACCINE  01/20/2018 (Originally 05/23/2017)  . PNEUMOCOCCAL POLYSACCHARIDE VACCINE (1) 10/24/2023 (Originally 08/21/1974)  . OPHTHALMOLOGY EXAM  11/23/2017  . HEMOGLOBIN A1C  12/09/2017  . MAMMOGRAM  03/05/2018  . FOOT EXAM  10/29/2018  . TETANUS/TDAP  02/04/2019  . COLONOSCOPY  01/02/2021  . HIV Screening  Completed    Lab Results  Component Value Date   WBC 7.2 03/06/2017   HGB 13.9 03/06/2017   HCT 41.8 03/06/2017   PLT 276.0 03/06/2017   GLUCOSE 104 (H) 06/08/2017   CHOL 168 06/08/2017   TRIG 54.0 06/08/2017   HDL 62.40 06/08/2017   LDLDIRECT 143.7 11/12/2013   LDLCALC 95 06/08/2017   ALT 8 06/08/2017   AST 14 06/08/2017   NA 137 06/08/2017   K 4.1 06/08/2017   CL 107 06/08/2017   CREATININE 0.85 06/08/2017   BUN 9 06/08/2017   CO2 25 06/08/2017   TSH 1.02 03/06/2017   HGBA1C 6.0 06/08/2017   MICROALBUR <0.7 08/29/2016    Lab Results  Component Value Date   TSH 1.02 03/06/2017   Lab Results  Component Value Date   WBC 7.2 03/06/2017   HGB 13.9 03/06/2017   HCT 41.8 03/06/2017   MCV  98.2 03/06/2017   PLT 276.0 03/06/2017   Lab Results  Component Value Date   NA 137 06/08/2017   K 4.1 06/08/2017   CO2 25 06/08/2017   GLUCOSE 104 (H) 06/08/2017   BUN 9 06/08/2017   CREATININE 0.85 06/08/2017   BILITOT 0.5 06/08/2017   ALKPHOS 38 (L) 06/08/2017   AST 14 06/08/2017   ALT 8 06/08/2017   PROT 6.5 06/08/2017   ALBUMIN 3.8 06/08/2017   CALCIUM 8.9 06/08/2017   GFR 93.10 06/08/2017   Lab Results  Component Value Date   CHOL 168 06/08/2017   Lab Results  Component Value Date   HDL 62.40 06/08/2017   Lab Results  Component Value Date   LDLCALC 95 06/08/2017   Lab Results  Component Value Date   TRIG 54.0 06/08/2017   Lab Results  Component Value Date   CHOLHDL 3 06/08/2017   Lab Results  Component Value Date   HGBA1C 6.0 06/08/2017         Assessment & Plan:   Problem List Items Addressed This Visit      Unprioritized   Diabetes mellitus type II, controlled ([place])    hgba1c to be done, minimize simple carbs. Increase exercise as tolerated. Continue current meds      Hyperlipidemia    Tolerating statin, encouraged heart healthy diet, avoid trans fats, minimize simple carbs and saturated fats. Increase exercise as tolerated      Lumbar degenerative disc disease    con't pain meds Database reviewed  Offered pain management due to pain worsening some but she refused at this time due to bad experience with place she went several years ago      Relevant Medications   HYDROcodone-acetaminophen (NORCO) 10-325 MG tablet   HYDROcodone-acetaminophen (NORCO) 10-325 MG tablet   HYDROcodone-acetaminophen (NORCO) 10-325 MG tablet   TOBACCO ABUSE    Discussed using the patch with the patient       Other Visit Diagnoses    Chronic pain syndrome    -  Primary   Relevant Medications   HYDROcodone-acetaminophen (NORCO) 10-325 MG tablet   Other Relevant Orders   Pain Mgmt, Profile  8 w/Conf, U   High risk medication use       Relevant Orders    Pain Mgmt, Profile 8 w/Conf, U   Hyperlipidemia LDL goal <100       Relevant Orders   Comprehensive metabolic panel   Lipid panel   Type 2 diabetes mellitus with other neurologic complication, without long-term current use of insulin (HCC)       Relevant Medications   glucose blood (ACCU-CHEK AVIVA PLUS) test strip   Other Relevant Orders   Urine Microalbumin w/creat. ratio   Hemoglobin A1c   Comprehensive metabolic panel   Lipid panel   Other chronic pain       Relevant Medications   HYDROcodone-acetaminophen (NORCO) 10-325 MG tablet   HYDROcodone-acetaminophen (NORCO) 10-325 MG tablet   HYDROcodone-acetaminophen (NORCO) 10-325 MG tablet      I am having Emily Maddox maintain her Probiotic Product (PROBIOTIC DAILY PO), ACCU-CHEK FASTCLIX LANCETS, diltiazem, simvastatin, ciclopirox, HYDROcodone-acetaminophen, HYDROcodone-acetaminophen, HYDROcodone-acetaminophen, and glucose blood.  Meds ordered this encounter  Medications  . HYDROcodone-acetaminophen (NORCO) 10-325 MG tablet    Sig: Take 1 tablet by mouth every 8 (eight) hours as needed.    Dispense:  60 tablet    Refill:  0    Do not fill until 11/18/2017  . HYDROcodone-acetaminophen (NORCO) 10-325 MG tablet    Sig: Take 1 tablet by mouth every 8 (eight) hours as needed.    Dispense:  60 tablet    Refill:  0    Do not fill until 12/19/2017  . HYDROcodone-acetaminophen (NORCO) 10-325 MG tablet    Sig: Take 1 tablet by mouth every 8 (eight) hours as needed.    Dispense:  60 tablet    Refill:  0    Do not fill until 01/16/2018  . glucose blood (ACCU-CHEK AVIVA PLUS) test strip    Sig: Check blood sugar once daily. Dx:E11.9    Dispense:  100 each    Refill:  5    accu check compact plus    CMA served as scribe during this visit. History, Physical and Plan performed by medical provider. Documentation and orders reviewed and attested to.  [name], DO

## 2017-10-29 NOTE — Assessment & Plan Note (Signed)
Discussed using the patch with the patient

## 2017-11-01 LAB — PAIN MGMT, PROFILE 8 W/CONF, U
6 Acetylmorphine: NEGATIVE ng/mL (ref <10)
ALCOHOL METABOLITES: NEGATIVE ng/mL (ref <500)
Amphetamines: NEGATIVE ng/mL (ref <500)
BENZODIAZEPINES: NEGATIVE ng/mL (ref <100)
BUPRENORPHINE, URINE: NEGATIVE ng/mL (ref <5)
CREATININE: 212.2 mg/dL
Cocaine Metabolite: NEGATIVE ng/mL (ref <150)
Codeine: NEGATIVE ng/mL (ref <50)
HYDROCODONE: 387 ng/mL — AB (ref <50)
Hydromorphone: 321 ng/mL — ABNORMAL HIGH (ref <50)
MARIJUANA METABOLITE: NEGATIVE ng/mL (ref <20)
MDMA: NEGATIVE ng/mL (ref <500)
MORPHINE: NEGATIVE ng/mL (ref <50)
Norhydrocodone: 2392 ng/mL — ABNORMAL HIGH (ref <50)
OPIATES: POSITIVE ng/mL — AB (ref <100)
OXIDANT: NEGATIVE ug/mL (ref <200)
Oxycodone: NEGATIVE ng/mL (ref <100)
PH: 6.56 (ref 4.5–9.0)

## 2017-11-27 ENCOUNTER — Other Ambulatory Visit: Payer: Self-pay | Admitting: Family Medicine

## 2017-11-27 DIAGNOSIS — R69 Illness, unspecified: Secondary | ICD-10-CM | POA: Diagnosis not present

## 2017-12-05 DIAGNOSIS — R69 Illness, unspecified: Secondary | ICD-10-CM | POA: Diagnosis not present

## 2017-12-06 ENCOUNTER — Telehealth: Payer: Self-pay | Admitting: Family Medicine

## 2017-12-06 MED ORDER — GLUCOSE BLOOD VI STRP: ORAL_STRIP | 2 refills | Status: DC

## 2017-12-06 NOTE — Telephone Encounter (Signed)
Copied from [place] [id number]. Topic: Quick Communication - See Telephone Encounter >> Dec 06, 2017 10:59 AM Rosalin Hawking wrote: CRM for notification. See Telephone encounter for:  12/06/17.

## 2017-12-06 NOTE — Telephone Encounter (Signed)
Patient notified that test strips were sent in.

## 2017-12-19 ENCOUNTER — Other Ambulatory Visit: Payer: Self-pay | Admitting: *Deleted

## 2017-12-19 DIAGNOSIS — E1149 Type 2 diabetes mellitus with other diabetic neurological complication: Secondary | ICD-10-CM

## 2017-12-19 DIAGNOSIS — R69 Illness, unspecified: Secondary | ICD-10-CM | POA: Diagnosis not present

## 2017-12-19 MED ORDER — ONETOUCH VERIO FLEX SYSTEM W/DEVICE KIT: 1.0000 | PACK | Freq: Every day | 0 refills | Status: AC

## 2017-12-19 MED ORDER — ONETOUCH DELICA LANCETS 33G MISC: 1 refills | Status: AC

## 2017-12-19 MED ORDER — GLUCOSE BLOOD VI STRP: ORAL_STRIP | 12 refills | Status: DC

## 2017-12-19 NOTE — Telephone Encounter (Signed)
Left message on machine letting patient know new rx was sent in for one touch meter.

## 2018-01-28 ENCOUNTER — Ambulatory Visit (INDEPENDENT_AMBULATORY_CARE_PROVIDER_SITE_OTHER): Payer: Medicare HMO | Admitting: Family Medicine

## 2018-01-28 ENCOUNTER — Encounter: Payer: Self-pay | Admitting: Family Medicine

## 2018-01-28 VITALS — BP 116/76 | HR 120 | Temp 97.8°F | Resp 16 | Ht 66.0 in | Wt 160.2 lb

## 2018-01-28 DIAGNOSIS — G4700 Insomnia, unspecified: Secondary | ICD-10-CM | POA: Diagnosis not present

## 2018-01-28 DIAGNOSIS — G894 Chronic pain syndrome: Secondary | ICD-10-CM | POA: Diagnosis not present

## 2018-01-28 DIAGNOSIS — M797 Fibromyalgia: Secondary | ICD-10-CM

## 2018-01-28 DIAGNOSIS — G8929 Other chronic pain: Secondary | ICD-10-CM | POA: Diagnosis not present

## 2018-01-28 DIAGNOSIS — R69 Illness, unspecified: Secondary | ICD-10-CM | POA: Diagnosis not present

## 2018-01-28 DIAGNOSIS — F321 Major depressive disorder, single episode, moderate: Secondary | ICD-10-CM | POA: Diagnosis not present

## 2018-01-28 MED ORDER — HYDROCODONE-ACETAMINOPHEN 10-325 MG PO TABS
1.0000 | ORAL_TABLET | Freq: Three times a day (TID) | ORAL | 0 refills | Status: DC | PRN
Start: 2018-01-28 — End: 2018-03-26

## 2018-01-28 MED ORDER — HYDROCODONE-ACETAMINOPHEN 10-325 MG PO TABS: 1.0000 | ORAL_TABLET | Freq: Three times a day (TID) | ORAL | 0 refills | Status: DC | PRN

## 2018-01-28 NOTE — Assessment & Plan Note (Signed)
Pt requesting to someone about her sleep ]the pain does not always bother her at night so she feels that is not contributing to her insomnia He sister tells her she is a light snorer

## 2018-01-28 NOTE — Assessment & Plan Note (Signed)
Controlled  con't meds Pain comes and goes

## 2018-01-28 NOTE — Progress Notes (Signed)
Patient ID: Emily Maddox, female   DOB: 12/27/1970, 47 y.o.   MRN: [id number]     Subjective:  I acted as a Education administrator for Dr. Carollee Herter.  Guerry Bruin, [place]   Patient ID: Emily Maddox, female    DOB: 04/22/1971, 47 y.o.   MRN: [id number]  Chief Complaint  Patient presents with  . chronic pain syndrome  . Insomnia    wants referral to do a sleep study    HPI Patient is in today for follow up chronic pain and insomnia.  She is doing well on current dose of hydrocodone.  She would like a referral for a sleep study.  Her insomina, she states is out of control.  Past Medical History:  Diagnosis Date  . 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 Diabetes mellitus   . Hyperlipidemia   . Tachycardia     Past Surgical History:  Procedure Laterality Date  . BREAST EXCISIONAL BIOPSY Left 2003    Family History  Problem Relation Age of Onset  . Cancer Mother 50       breast  . Hypertension Mother   . Hyperlipidemia Mother   . Diabetes Mother   . Breast cancer Mother 62  . Cancer Father 68       colon  . Hypertension Father   . Hyperlipidemia Father   . Heart disease Father        a fib  . Diabetes Father   . Diabetes Sister   . Breast cancer Unknown   . Colon cancer Unknown     Social History   Socioeconomic History  . Marital status: Single    Spouse name: Not on file  . Number of children: Not on file  . Years of education: Not on file  . Highest education level: Not on file  Occupational History    Employer: UNEMPLOYED  Social Needs  . Financial resource strain: Not on file  . Food insecurity:    Worry: Not on file    Inability: Not on file  . Transportation needs:    Medical: Not on file    Non-medical: Not on file  Tobacco Use  . Smoking status: Current Every Day Smoker    Packs/day: 1.00    Years: 19.00    Pack years: 19.00    Types: Cigarettes  . Smokeless tobacco: Never Used  . Tobacco comment:  couldn't tolerate chantix, cut down to 0.5 pack since 11/2013  Substance and Sexual Activity  . Alcohol use: No  . Drug use: No  . Sexual activity: Yes    Partners: Female  Lifestyle  . Physical activity:    Days per week: Not on file    Minutes per session: Not on file  . Stress: Not on file  Relationships  . Social connections:    Talks on phone: Not on file    Gets together: Not on file    Attends religious service: Not on file    Active member of club or organization: Not on file    Attends meetings of clubs or organizations: Not on file    Relationship status: Not on file  . Intimate partner violence:    Fear of current or ex partner: Not on file    Emotionally abused: Not on file    Physically abused: Not on file    Forced sexual activity: Not on file  Other Topics Concern  . Not on file  Social  History Narrative   Lives alone.     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 in business administration   Exercise--  3-4 x a week    Outpatient Medications Prior to Visit  Medication Sig Dispense Refill  . Blood Glucose Monitoring Suppl ([place]) w/Device KIT 1 each by Does not apply route daily. Dx code: E11.9 1 kit 0  . ciclopirox (PENLAC) 8 % solution Apply topically at bedtime. Apply over nail and surrounding skin. Apply daily over previous coat. After seven (7) days, may remove with alcohol and continue cycle. 6.6 mL 0  . glucose blood (ONETOUCH VERIO) test strip Check sugar every day.  Dx Code: E11.9 100 each 12  . ONETOUCH DELICA LANCETS 93O MISC Check sugar every day.  Dx Code: E11.9 100 each 1  . Probiotic Product (PROBIOTIC DAILY PO) Take 1 capsule by mouth daily.    . simvastatin (ZOCOR) 20 MG tablet TAKE 1 TABLET BY MOUTH ONCE DAILY 90 tablet 0  . HYDROcodone-acetaminophen (NORCO) 10-325 MG tablet Take 1 tablet by mouth every 8 (eight) hours as needed. 60 tablet 0  . HYDROcodone-acetaminophen (NORCO) 10-325 MG tablet Take 1 tablet by mouth every 8  (eight) hours as needed. 60 tablet 0  . HYDROcodone-acetaminophen (NORCO) 10-325 MG tablet Take 1 tablet by mouth every 8 (eight) hours as needed. 60 tablet 0  . diltiazem (CARDIZEM) 30 MG tablet Take 1 tablet (30 mg total) by mouth 2 (two) times daily. 60 tablet 11   No facility-administered medications prior to visit.     Allergies  Allergen Reactions  . Metoprolol Other (See Comments)    exhaustion  . Ultram [Tramadol] Palpitations    Review of Systems  Constitutional: Negative for fever and malaise/fatigue.  HENT: Negative for congestion.   Eyes: Negative for blurred vision.  Respiratory: Negative for cough and shortness of breath.   Cardiovascular: Negative for chest pain, palpitations and leg swelling.  Gastrointestinal: Negative for vomiting.  Musculoskeletal: Positive for back pain and joint pain.  Skin: Negative for rash.  Neurological: Negative for loss of consciousness and headaches.  Psychiatric/Behavioral: Negative for depression, substance abuse and suicidal ideas. The patient has insomnia.        Objective:    Physical Exam  Constitutional: She is oriented to person, place, and time. She appears well-developed and well-nourished.  HENT:  Head: Normocephalic and atraumatic.  Eyes: Conjunctivae and EOM are normal.  Neck: Normal range of motion. Neck supple. No JVD present. Carotid bruit is not present. No thyromegaly present.  Cardiovascular: Normal rate, regular rhythm and normal heart sounds.  No murmur heard. Pulmonary/Chest: Effort normal and breath sounds normal. No respiratory distress. She has no wheezes. She has no rales. She exhibits no tenderness.  Musculoskeletal: She exhibits no edema.  Neurological: She is alert and oriented to person, place, and time.  Psychiatric: She has a normal mood and affect.  Nursing note and vitals reviewed.   BP 116/76 (BP Location: Right Arm, Cuff Size: Normal)   Pulse (!) 120   Temp 97.8 F (36.6 C) (Oral)   Resp  16   Ht '5\' 6"'$  (1.676 m)   Wt 160 lb 3.2 oz (72.7 kg)   LMP 12/28/2017   SpO2 99%   BMI 25.86 kg/m  Wt Readings from Last 3 Encounters:  01/28/18 160 lb 3.2 oz (72.7 kg)  07/13/17 111 lb (50.3 kg)  05/02/17 156 lb (70.8 kg)     Lab Results  Component Value Date  WBC 7.2 03/06/2017   HGB 13.9 03/06/2017   HCT 41.8 03/06/2017   PLT 276.0 03/06/2017   GLUCOSE 96 10/29/2017   CHOL 172 10/29/2017   TRIG 54.0 10/29/2017   HDL 61.00 10/29/2017   LDLDIRECT 143.7 11/12/2013   LDLCALC 100 (H) 10/29/2017   ALT 6 10/29/2017   AST 12 10/29/2017   NA 137 10/29/2017   K 4.4 10/29/2017   CL 106 10/29/2017   CREATININE 0.87 10/29/2017   BUN 9 10/29/2017   CO2 26 10/29/2017   TSH 1.02 03/06/2017   HGBA1C 6.0 10/29/2017   MICROALBUR <0.7 10/29/2017    Lab Results  Component Value Date   TSH 1.02 03/06/2017   Lab Results  Component Value Date   WBC 7.2 03/06/2017   HGB 13.9 03/06/2017   HCT 41.8 03/06/2017   MCV 98.2 03/06/2017   PLT 276.0 03/06/2017   Lab Results  Component Value Date   NA 137 10/29/2017   K 4.4 10/29/2017   CO2 26 10/29/2017   GLUCOSE 96 10/29/2017   BUN 9 10/29/2017   CREATININE 0.87 10/29/2017   BILITOT 0.4 10/29/2017   ALKPHOS 41 10/29/2017   AST 12 10/29/2017   ALT 6 10/29/2017   PROT 6.7 10/29/2017   ALBUMIN 4.0 10/29/2017   CALCIUM 8.9 10/29/2017   GFR 90.47 10/29/2017   Lab Results  Component Value Date   CHOL 172 10/29/2017   Lab Results  Component Value Date   HDL 61.00 10/29/2017   Lab Results  Component Value Date   LDLCALC 100 (H) 10/29/2017   Lab Results  Component Value Date   TRIG 54.0 10/29/2017   Lab Results  Component Value Date   CHOLHDL 3 10/29/2017   Lab Results  Component Value Date   HGBA1C 6.0 10/29/2017       Assessment & Plan:   Problem List Items Addressed This Visit      Unprioritized   Fibromyalgia    Controlled  con't meds Pain comes and goes      Insomnia - Primary    Pt requesting  to someone about her sleep ]the pain does not always bother her at night so she feels that is not contributing to her insomnia He sister tells her she is a light snorer      Relevant Orders   Ambulatory referral to Neurology   Moderate major depression ([place])    stable       Other Visit Diagnoses    Other chronic pain       Relevant Medications   HYDROcodone-acetaminophen (NORCO) 10-325 MG tablet   HYDROcodone-acetaminophen (NORCO) 10-325 MG tablet   HYDROcodone-acetaminophen (NORCO) 10-325 MG tablet   Chronic pain syndrome       Relevant Medications   HYDROcodone-acetaminophen (NORCO) 10-325 MG tablet      I have discontinued Emily Maddox's diltiazem. I am also having her maintain her Probiotic Product (PROBIOTIC DAILY PO), ciclopirox, simvastatin, ONETOUCH VERIO FLEX SYSTEM, glucose blood, ONETOUCH DELICA LANCETS 56P, HYDROcodone-acetaminophen, HYDROcodone-acetaminophen, and HYDROcodone-acetaminophen.  Meds ordered this encounter  Medications  . HYDROcodone-acetaminophen (NORCO) 10-325 MG tablet    Sig: Take 1 tablet by mouth every 8 (eight) hours as needed.    Dispense:  60 tablet    Refill:  0    Do not fill until 04/18/2018  . HYDROcodone-acetaminophen (NORCO) 10-325 MG tablet    Sig: Take 1 tablet by mouth every 8 (eight) hours as needed.    Dispense:  60 tablet  Refill:  0    Do not fill until 03/18/2018  . HYDROcodone-acetaminophen (NORCO) 10-325 MG tablet    Sig: Take 1 tablet by mouth every 8 (eight) hours as needed.    Dispense:  60 tablet    Refill:  0    Do not fill until 02/16/2018    CMA served as scribe during this visit. History, Physical and Plan performed by medical provider. Documentation and orders reviewed and attested to.  [name], DO

## 2018-01-28 NOTE — Assessment & Plan Note (Signed)
stable °

## 2018-01-28 NOTE — Patient Instructions (Signed)

## 2018-03-26 ENCOUNTER — Encounter

## 2018-03-26 ENCOUNTER — Ambulatory Visit (INDEPENDENT_AMBULATORY_CARE_PROVIDER_SITE_OTHER): Payer: Medicare HMO | Admitting: Neurology

## 2018-03-26 ENCOUNTER — Encounter: Payer: Self-pay | Admitting: Neurology

## 2018-03-26 VITALS — BP 116/83 | HR 93 | Ht 66.0 in | Wt 158.0 lb

## 2018-03-26 DIAGNOSIS — R0683 Snoring: Secondary | ICD-10-CM | POA: Diagnosis not present

## 2018-03-26 DIAGNOSIS — R002 Palpitations: Secondary | ICD-10-CM

## 2018-03-26 DIAGNOSIS — G4709 Other insomnia: Secondary | ICD-10-CM | POA: Diagnosis not present

## 2018-03-26 DIAGNOSIS — G8929 Other chronic pain: Secondary | ICD-10-CM

## 2018-03-26 DIAGNOSIS — R351 Nocturia: Secondary | ICD-10-CM | POA: Insufficient documentation

## 2018-03-26 DIAGNOSIS — G4701 Insomnia due to medical condition: Secondary | ICD-10-CM
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
## 2018-03-26 NOTE — Patient Instructions (Signed)

## 2018-03-27 ENCOUNTER — Telehealth: Payer: Self-pay

## 2018-03-27 ENCOUNTER — Other Ambulatory Visit: Payer: Self-pay | Admitting: Family Medicine

## 2018-03-27 DIAGNOSIS — G4700 Insomnia, unspecified: Secondary | ICD-10-CM

## 2018-03-27 DIAGNOSIS — R0683 Snoring: Secondary | ICD-10-CM

## 2018-03-27 DIAGNOSIS — Z1231 Encounter for screening mammogram for malignant neoplasm of breast: Secondary | ICD-10-CM

## 2018-03-27 NOTE — Telephone Encounter (Signed)
[employer] denied in lab sleep study despite patient is on narcotic. Need HST order

## 2018-03-28 ENCOUNTER — Other Ambulatory Visit: Payer: Self-pay | Admitting: Neurology

## 2018-04-07 ENCOUNTER — Encounter (HOSPITAL_COMMUNITY): Payer: Self-pay | Admitting: Emergency Medicine

## 2018-04-07 ENCOUNTER — Emergency Department (HOSPITAL_COMMUNITY): Payer: Medicare HMO

## 2018-04-07 ENCOUNTER — Other Ambulatory Visit: Payer: Self-pay

## 2018-04-07 ENCOUNTER — Emergency Department (HOSPITAL_COMMUNITY)
Admission: EM | Admit: 2018-04-07 | Discharge: 2018-04-07 | Disposition: A | Discharge status: Home / Self Care | Payer: Medicare HMO | Attending: Emergency Medicine | Admitting: Emergency Medicine

## 2018-04-07 DIAGNOSIS — F17210 Nicotine dependence, cigarettes, uncomplicated: Secondary | ICD-10-CM | POA: Diagnosis not present

## 2018-04-07 DIAGNOSIS — R002 Palpitations: Secondary | ICD-10-CM | POA: Diagnosis present

## 2018-04-07 DIAGNOSIS — R0682 Tachypnea, not elsewhere classified: Secondary | ICD-10-CM | POA: Diagnosis not present

## 2018-04-07 DIAGNOSIS — R079 Chest pain, unspecified: Secondary | ICD-10-CM | POA: Diagnosis not present

## 2018-04-07 DIAGNOSIS — E119 Type 2 diabetes mellitus without complications: Secondary | ICD-10-CM | POA: Insufficient documentation

## 2018-04-07 DIAGNOSIS — R69 Illness, unspecified: Secondary | ICD-10-CM | POA: Diagnosis not present

## 2018-04-07 DIAGNOSIS — R000 Tachycardia, unspecified: Secondary | ICD-10-CM | POA: Diagnosis not present

## 2018-04-07 DIAGNOSIS — Z79899 Other long term (current) drug therapy: Secondary | ICD-10-CM | POA: Diagnosis not present

## 2018-04-07 LAB — I-STAT BETA HCG BLOOD, ED (MC, WL, AP ONLY): I-stat hCG, quantitative: <5 m[IU]/mL (ref <5)

## 2018-04-07 LAB — CBC
HEMATOCRIT: 41.3 % (ref 36.0–46.0)
HEMOGLOBIN: 13.8 g/dL (ref 12.0–15.0)
MCH: 32.5 pg (ref 26.0–34.0)
MCHC: 33.4 g/dL (ref 30.0–36.0)
MCV: 97.4 fL (ref 78.0–100.0)
Platelets: 276 10*3/uL (ref 150–400)
RBC: 4.24 MIL/uL (ref 3.87–5.11)
RDW: 13.2 % (ref 11.5–15.5)
WBC: 9.8 10*3/uL (ref 4.0–10.5)

## 2018-04-07 LAB — BASIC METABOLIC PANEL
ANION GAP: 10 (ref 5–15)
BUN: 11 mg/dL (ref 6–20)
CO2: 21 mmol/L — AB (ref 22–32)
Calcium: 9.2 mg/dL (ref 8.9–10.3)
Chloride: 105 mmol/L (ref 101–111)
Creatinine, Ser: 0.83 mg/dL (ref 0.44–1.00)
GLUCOSE: 102 mg/dL — AB (ref 65–99)
POTASSIUM: 4.3 mmol/L (ref 3.5–5.1)
Sodium: 136 mmol/L (ref 135–145)

## 2018-04-07 LAB — I-STAT TROPONIN, ED: Troponin i, poc: 0 ng/mL (ref 0.00–0.08)

## 2018-04-07 LAB — D-DIMER, QUANTITATIVE (NOT AT ARMC): D DIMER QUANT: 0.31 ug{FEU}/mL (ref 0.00–0.50)

## 2018-04-07 LAB — TSH: TSH: 1.03 u[IU]/mL (ref 0.350–4.500)

## 2018-04-07 MED ORDER — IOPAMIDOL (ISOVUE-370) INJECTION 76%
100.0000 mL | Freq: Once | INTRAVENOUS | Status: AC | PRN
  Administered 2018-04-07: 100 mL via INTRAVENOUS

## 2018-04-07 NOTE — ED Provider Notes (Signed)
[facility] EMERGENCY DEPARTMENT Provider Note   CSN: [id number] Arrival date & time: 04/07/18  1652     History   Chief Complaint Chief Complaint  Patient presents with  . Chest Pain    HPI Emily Maddox is a 47 y.o. female.  Palpitations for 3 weeks with associated sensation of rapid heart rate, cramping in her left breast, faintness, dizziness.  No frank substernal chest pain, dyspnea, fever, sweats, chills, cough.  She does smoke cigarettes.  She has had tachycardia in the past.  Past medical history positive for borderline diabetes, joint pain (fibromyalgia), uterine fibroids, hyperlipidemia.  Severity of symptoms is moderate.  Nothing makes symptoms better or worse.  She has flown in an airplane twice in the past several weeks.     Past Medical History:  Diagnosis Date  . 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 Diabetes mellitus   . Hyperlipidemia   . Tachycardia     Patient Active Problem List   Diagnosis Date Noted  . Snoring 03/26/2018  . Nocturia more than twice per night 03/26/2018  . Insomnia 01/28/2018  . Preventative health care 03/06/2017  . Fibromyalgia 07/26/2015  . Bulging lumbar disc 05/22/2013  . 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 Hiatal hernia 08/22/2010  . Fibroids 09/03/2009  . Diabetes mellitus type II, controlled ([place]) 11/27/2008  . Hyperlipidemia 11/27/2008  . TOBACCO ABUSE 11/25/2008  . Lumbar degenerative disc disease 11/25/2008    Past Surgical History:  Procedure Laterality Date  . BREAST EXCISIONAL BIOPSY Left 2003     OB History   None      Home Medications    Prior to Admission medications   Medication Sig Start Date End Date Taking? Authorizing Provider  Blood Glucose Monitoring Suppl ([place]) w/Device KIT 1 each by Does not apply route daily. Dx code: E11.9 12/19/17  Yes Roma Schanz R, DO  glucose blood (ONETOUCH VERIO) test strip  Check sugar every day.  Dx Code: E11.9 12/19/17  Yes [name], DO  HYDROcodone-acetaminophen (NORCO) 10-325 MG tablet Take 1 tablet by mouth every 8 (eight) hours as needed. 01/28/18  Yes Lowne Chase, Yvonne R, DO  ONETOUCH DELICA LANCETS 94R MISC Check sugar every day.  Dx Code: E11.9 12/19/17  Yes [name], DO  Probiotic Product (PROBIOTIC DAILY PO) Take 1 capsule by mouth daily.   Yes [provider]  simvastatin (ZOCOR) 20 MG tablet TAKE 1 TABLET BY MOUTH ONCE DAILY 11/28/17  Yes [name], DO    Family History Family History  Problem Relation Age of Onset  . Cancer Mother 24       breast  . Hypertension Mother   . Hyperlipidemia Mother   . Diabetes Mother   . Breast cancer Mother 8  . Cancer Father 12       colon  . Hypertension Father   . Hyperlipidemia Father   . Heart disease Father        a fib  . Diabetes Father   . Diabetes Sister   . Breast cancer Unknown   . Colon cancer Unknown     Social History Social History   Tobacco Use  . Smoking status: Current Every Day Smoker    Packs/day: 1.00    Years: 19.00    Pack years: 19.00    Types: Cigarettes  . Smokeless tobacco: Never Used  . Tobacco comment: couldn't  tolerate chantix, cut down to 0.5 pack since 11/2013  Substance Use Topics  . Alcohol use: No  . Drug use: No     Allergies   Metoprolol and Ultram [tramadol]   Review of Systems Review of Systems  All other systems reviewed and are negative.    Physical Exam Updated Vital Signs BP 116/87 (BP Location: Right Arm)   Pulse 95   Temp 97.9 F (36.6 C) (Oral)   Resp 14   Ht '5\' 6"'$  (1.676 m)   Wt 71.7 kg (158 lb)   LMP 03/17/2018   SpO2 98%   BMI 25.50 kg/m   Physical Exam  Constitutional: She is oriented to person, place, and time. She appears well-developed and well-nourished.  nad  HENT:  Head: Normocephalic and atraumatic.  Eyes: Conjunctivae are normal.  Neck: Neck supple.  Cardiovascular:  Regular rhythm. Tachycardia present.  Pulmonary/Chest: Effort normal and breath sounds normal.  Abdominal: Soft. Bowel sounds are normal.  Musculoskeletal: Normal range of motion.  Neurological: She is alert and oriented to person, place, and time.  Skin: Skin is warm and dry.  Psychiatric: She has a normal mood and affect. Her behavior is normal.  Nursing note and vitals reviewed.    ED Treatments / Results  Labs (all labs ordered are listed, but only abnormal results are displayed) Labs Reviewed  BASIC METABOLIC PANEL - Abnormal; Notable for the following components:      Result Value   CO2 21 (*)    Glucose, Bld 102 (*)    All other components within normal limits  CBC  D-DIMER, QUANTITATIVE (NOT AT [facility])  TSH  I-STAT BETA HCG BLOOD, ED ([facility], [facility], [facility] ONLY)  I-STAT TROPONIN, ED    EKG EKG Interpretation  Date/Time:  Sunday April 07 2018 17:03:52 EDT Ventricular Rate:  121 PR Interval:    QRS Duration: 72 QT Interval:  307 QTC Calculation: 436 R Axis:   58 Text Interpretation:  Sinus tachycardia Consider right atrial enlargement Low voltage, precordial leads Minimal ST depression, inferior leads Confirmed by Fredia Sorrow [phone]) on 04/09/2018 12:04:57 AM   Date: 04/07/2018  Rate: 121  Rhythm: sinus tachy  QRS Axis: normal  Intervals: normal  ST/T Wave abnormalities: normal  Conduction Disutrbances: none  Narrative Interpretation: unremarkable    Radiology Dg Chest 2 View  Result Date: 04/07/2018 CLINICAL DATA:  Nonradiating left-sided chest pain x1 week. Dyspnea and weakness with dizziness and chest palpitations. EXAM: CHEST - 2 VIEW COMPARISON:  None. FINDINGS: The heart size and mediastinal contours are within normal limits. Both lungs are clear. The visualized skeletal structures are unremarkable. IMPRESSION: No active cardiopulmonary disease. Electronically Signed   By: Ashley Royalty M.D.   On: 04/07/2018 17:37   Ct Angio Chest Pe W And/or Wo  Contrast  Result Date: 04/07/2018 CLINICAL DATA:  Unexplained tachypnea. Recent air travel. PE suspected. Intermediate probability EXAM: CT ANGIOGRAPHY CHEST WITH CONTRAST TECHNIQUE: Multidetector CT imaging of the chest was performed using the standard protocol during bolus administration of intravenous contrast. Multiplanar CT image reconstructions and MIPs were obtained to evaluate the vascular anatomy. CONTRAST:  169m ISOVUE-370 IOPAMIDOL (ISOVUE-370) INJECTION 76% COMPARISON:  None. FINDINGS: Cardiovascular: No filling defects within the pulmonary arteries to suggest acute pulmonary embolism. No acute findings of the aorta or great vessels. No pericardial fluid. Mediastinum/Nodes: No axillary supraclavicular adenopathy. No mediastinal hilar adenopathy. No pericardial effusion. Esophagus normal. Lungs/Pleura: Lungs are clear clear.  No infarction or infection. Upper Abdomen: Limited view of  the liver, kidneys, pancreas are unremarkable. Normal adrenal glands. Musculoskeletal: No acute osseous abnormality. Review of the MIP images confirms the above findings. IMPRESSION: 1. No acute pulmonary embolism. 2. No acute pulmonary parenchymal abnormality. Electronically Signed   By: Suzy Bouchard M.D.   On: 04/07/2018 21:10    Procedures Procedures (including critical care time)  Medications Ordered in ED Medications  iopamidol (ISOVUE-370) 76 % injection 100 mL (100 mLs Intravenous Contrast Given 04/07/18 2039)     Initial Impression / Assessment and Plan / ED Course  I have reviewed the triage vital signs and the nursing notes.  Pertinent labs & imaging results that were available during my care of the patient were reviewed by me and considered in my medical decision making (see chart for details).     Patient presents with a constellation of symptoms including palpitations and tachycardia.  She has recently flown.  CT angiogram of chest reveals no pulmonary embolism.  Glucose stable.  TSH  negative.  Her pulse has normalized at discharge.  She will follow-up with her primary care doctor.  Final Clinical Impressions(s) / ED Diagnoses   Final diagnoses:  Tachycardia    ED Discharge Orders    None       Nat Christen, MD 04/09/18 1053

## 2018-04-07 NOTE — ED Notes (Signed)
Pt back from CT

## 2018-04-07 NOTE — Discharge Instructions (Addendum)
Multiple tests including labs and CT angiogram of lungs were normal.  Follow-up your primary care doctor.  Avoid cigarettes, coffee, colas, tea, [facility]

## 2018-04-07 NOTE — ED Triage Notes (Signed)
Patient c/o left-side, non-radiating chest pain/cramping x1 week. Patient states shortness of breath, weakness, dizziness, and palpitations. Patient does report hx of tachycardia. Patient seen at Urgent Care and sent to ER after EKG done.

## 2018-04-09 ENCOUNTER — Encounter: Payer: Self-pay | Admitting: Family Medicine

## 2018-04-09 ENCOUNTER — Ambulatory Visit (INDEPENDENT_AMBULATORY_CARE_PROVIDER_SITE_OTHER): Payer: Medicare HMO | Admitting: Family Medicine

## 2018-04-09 VITALS — BP 111/80 | HR 112 | Temp 98.5°F | Resp 16 | Ht 66.14 in | Wt 155.6 lb

## 2018-04-09 DIAGNOSIS — W57XXXA Bitten or stung by nonvenomous insect and other nonvenomous arthropods, initial encounter: Secondary | ICD-10-CM | POA: Diagnosis not present

## 2018-04-09 DIAGNOSIS — S30861A Insect bite (nonvenomous) of abdominal wall, initial encounter: Secondary | ICD-10-CM

## 2018-04-09 DIAGNOSIS — R000 Tachycardia, unspecified: Secondary | ICD-10-CM | POA: Diagnosis not present

## 2018-04-09 DIAGNOSIS — R5383 Other fatigue: Secondary | ICD-10-CM | POA: Diagnosis not present

## 2018-04-09 MED ORDER — DOXYCYCLINE HYCLATE 100 MG PO TABS: 100.0000 mg | ORAL_TABLET | Freq: Two times a day (BID) | ORAL | 0 refills | Status: DC

## 2018-04-09 MED FILL — DOXYCYCLINE HYCLATE 100 MG: 100 | 10 days supply | Qty: 20 | Fill #0

## 2018-04-09 NOTE — Patient Instructions (Signed)
Sinus Tachycardia °Sinus tachycardia is a kind of fast heartbeat. In sinus tachycardia, the heart beats more than 100 times a minute. Sinus tachycardia starts in a part of the heart called the sinus node. Sinus tachycardia may be harmless, or it may be a sign of a serious condition. °What are the causes? °This condition may be caused by: °· Exercise or exertion. °· A fever. °· Pain. °· Loss of body fluids (dehydration). °· Severe bleeding (hemorrhage). °· Anxiety and stress. °· Certain substances, including: °? Alcohol. °? Caffeine. °? Tobacco and nicotine products. °? Diet pills. °? Illegal drugs. °· Medical conditions including: °? Heart disease. °? An infection. °? An overactive thyroid (hyperthyroidism). °? A lack of red blood cells (anemia). ° °What are the signs or symptoms? °Symptoms of this condition include: °· A feeling that the heart is beating quickly (palpitations). °· Suddenly noticing your heartbeat (cardiac awareness). °· Dizziness. °· Tiredness (fatigue). °· Shortness of breath. °· Chest pain. °· Nausea. °· Fainting. ° °How is this diagnosed? °This condition is diagnosed with: °· A physical exam. °· Other tests, such as: °? Blood tests. °? An electrocardiogram (ECG). This test measures the electrical activity of the heart. °? Holter monitoring. For this test, you wear a device that records your heartbeat for one or more days. ° °You may be referred to a heart specialist (cardiologist). °How is this treated? °Treatment for this condition depends on the cause or underlying condition. Treatment may involve: °· Treating the underlying condition. °· Taking new medicines or changing your current medicines as told by your health care provider. °· Making changes to your diet or lifestyle. °· Practicing relaxation methods. ° °Follow these instructions at home: °Lifestyle °· Do not use any products that contain nicotine or tobacco, such as cigarettes and e-cigarettes. If you need help quitting, ask your  health care provider. °· Learn relaxation methods, like deep breathing, to help you when you get stressed or anxious. °· Do not use illegal drugs, such as cocaine. °· Do not abuse alcohol. Limit alcohol intake to no more than 1 drink a day for non-pregnant women and 2 drinks a day for men. One drink equals 12 oz of beer, 5 oz of wine, or 1½ oz of hard liquor. °· Find time to rest and relax often. This reduces stress. °· Avoid: °? Caffeine. °? Stimulants such as over-the-counter diet pills or pills that help you to stay awake. °? Situations that cause anxiety or stress. °General instructions °· Drink enough fluids to keep your urine clear or pale yellow. °· Take over-the-counter and prescription medicines only as told by your health care provider. °· Keep all follow-up visits as told by your health care provider. This is important. °Contact a health care provider if: °· You have a fever. °· You have vomiting or diarrhea that keeps happening (is persistent). °Get help right away if: °· You have pain in your chest, upper arms, jaw, or neck. °· You become weak or dizzy. °· You feel faint. °· You have palpitations that do not go away. °This information is not intended to replace advice given to you by your health care provider. Make sure you discuss any questions you have with your health care provider. °Document Released: 11/16/2004 Document Revised: 05/06/2016 Document Reviewed: 04/23/2015 °Elsevier Interactive Patient Education © 2018 [employer] ° °

## 2018-04-09 NOTE — Progress Notes (Signed)
Subjective:  I acted as a Education administrator for [employer]. Yancey Flemings, [place]   Patient ID: Emily Maddox, female    DOB: December 24, 1970, 47 y.o.   MRN: [id number]  Chief Complaint  Patient presents with  . Fatigue  . Follow-up    HPI  Patient is in today for ED follow up. Pt states she is very fatigue.  Patient Care Team: Carollee Herter, Alferd Apa, DO as PCP - General Juanita Craver, MD as Consulting Physician (Gastroenterology) Linda Hedges, DO as Consulting Physician (Obstetrics and Gynecology)   Past Medical History:  Diagnosis Date  . 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 Diabetes mellitus   . Hyperlipidemia   . Tachycardia     Past Surgical History:  Procedure Laterality Date  . BREAST EXCISIONAL BIOPSY Left 2003    Family History  Problem Relation Age of Onset  . Cancer Mother 70       breast  . Hypertension Mother   . Hyperlipidemia Mother   . Diabetes Mother   . Breast cancer Mother 67  . Cancer Father 97       colon  . Hypertension Father   . Hyperlipidemia Father   . Heart disease Father        a fib  . Diabetes Father   . Diabetes Sister   . Breast cancer Unknown   . Colon cancer Unknown     Social History   Socioeconomic History  . Marital status: Single    Spouse name: Not on file  . Number of children: Not on file  . Years of education: Not on file  . Highest education level: Not on file  Occupational History    Employer: UNEMPLOYED  Social Needs  . Financial resource strain: Not on file  . Food insecurity:    Worry: Not on file    Inability: Not on file  . Transportation needs:    Medical: Not on file    Non-medical: Not on file  Tobacco Use  . Smoking status: Current Every Day Smoker    Packs/day: 1.00    Years: 19.00    Pack years: 19.00    Types: Cigarettes  . Smokeless tobacco: Never Used  . Tobacco comment: couldn't tolerate chantix, cut down to 0.5 pack since 11/2013  Substance and  Sexual Activity  . Alcohol use: No  . Drug use: No  . Sexual activity: Yes    Partners: Female  Lifestyle  . Physical activity:    Days per week: Not on file    Minutes per session: Not on file  . Stress: Not on file  Relationships  . Social connections:    Talks on phone: Not on file    Gets together: Not on file    Attends religious service: Not on file    Active member of club or organization: Not on file    Attends meetings of clubs or organizations: Not on file    Relationship status: Not on file  . Intimate partner violence:    Fear of current or ex partner: Not on file    Emotionally abused: Not on file    Physically abused: Not on file    Forced sexual activity: Not on file  Other Topics Concern  . Not on file  Social History Narrative   Lives alone.     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 in business administration   Exercise--  3-4 x a week  Outpatient Medications Prior to Visit  Medication Sig Dispense Refill  . Blood Glucose Monitoring Suppl ([place]) w/Device KIT 1 each by Does not apply route daily. Dx code: E11.9 1 kit 0  . glucose blood (ONETOUCH VERIO) test strip Check sugar every day.  Dx Code: E11.9 100 each 12  . HYDROcodone-acetaminophen (NORCO) 10-325 MG tablet Take 1 tablet by mouth every 8 (eight) hours as needed. 60 tablet 0  . ONETOUCH DELICA LANCETS 17O MISC Check sugar every day.  Dx Code: E11.9 100 each 1  . Probiotic Product (PROBIOTIC DAILY PO) Take 1 capsule by mouth daily.    . simvastatin (ZOCOR) 20 MG tablet TAKE 1 TABLET BY MOUTH ONCE DAILY 90 tablet 0   No facility-administered medications prior to visit.     Allergies  Allergen Reactions  . Metoprolol Other (See Comments)    exhaustion  . Ultram [Tramadol] Palpitations    Review of Systems  Constitutional: Positive for malaise/fatigue. Negative for chills and fever.  HENT: Negative for congestion and hearing loss.   Eyes: Negative for discharge.    Respiratory: Negative for cough, sputum production and shortness of breath.   Cardiovascular: Negative for chest pain, palpitations and leg swelling.  Gastrointestinal: Negative for abdominal pain, blood in stool, constipation, diarrhea, heartburn, nausea and vomiting.  Genitourinary: Negative for dysuria, frequency, hematuria and urgency.  Musculoskeletal: Negative for back pain, falls and myalgias.  Skin: Negative for rash.  Neurological: Negative for dizziness, sensory change, loss of consciousness, weakness and headaches.  Endo/Heme/Allergies: Negative for environmental allergies. Does not bruise/bleed easily.  Psychiatric/Behavioral: Negative for depression and suicidal ideas. The patient is not nervous/anxious and does not have insomnia.        Objective:    Physical Exam  Constitutional: She is oriented to person, place, and time. She appears well-developed and well-nourished.  HENT:  Head: Normocephalic and atraumatic.  Eyes: Conjunctivae and EOM are normal.  Neck: Normal range of motion. Neck supple. No JVD present. Carotid bruit is not present. No thyromegaly present.  Cardiovascular: Normal rate, regular rhythm and normal heart sounds.  No murmur heard. Pulmonary/Chest: Effort normal and breath sounds normal. No respiratory distress. She has no wheezes. She has no rales. She exhibits no tenderness.  Musculoskeletal: She exhibits no edema.  Neurological: She is alert and oriented to person, place, and time.  Psychiatric: She has a normal mood and affect.  Nursing note and vitals reviewed.   BP 111/80 (BP Location: Left Arm, Patient Position: Sitting, Cuff Size: Normal)   Pulse (!) 112   Temp 98.5 F (36.9 C) (Oral)   Resp 16   Ht 5' 6.14" (1.68 m)   Wt 155 lb 9.6 oz (70.6 kg)   LMP 03/17/2018   SpO2 100%   BMI 25.01 kg/m  Wt Readings from Last 3 Encounters:  04/09/18 155 lb 9.6 oz (70.6 kg)  04/07/18 158 lb (71.7 kg)  03/26/18 158 lb (71.7 kg)   BP Readings  from Last 3 Encounters:  04/09/18 111/80  04/07/18 116/87  03/26/18 116/83     Immunization History  Administered Date(s) Administered  . Hep A / Hep B 02/03/2009, 04/05/2009, 09/03/2009, 10/31/2010  . PPD Test 08/08/2017  . Td 02/03/2009    Health Maintenance  Topic Date Due  . PAP SMEAR  07/27/2017  . OPHTHALMOLOGY EXAM  11/23/2017  . MAMMOGRAM  03/05/2018  . PNEUMOCOCCAL POLYSACCHARIDE VACCINE (1) 10/24/2023 (Originally 08/21/1974)  . HEMOGLOBIN A1C  04/28/2018  . INFLUENZA VACCINE  05/23/2018  . FOOT EXAM  10/29/2018  . URINE MICROALBUMIN  10/29/2018  . TETANUS/TDAP  02/04/2019  . COLONOSCOPY  01/02/2021  . HIV Screening  Completed    Lab Results  Component Value Date   WBC 7.1 04/09/2018   HGB 13.1 04/09/2018   HCT 39.0 04/09/2018   PLT 268.0 04/09/2018   GLUCOSE 95 04/09/2018   CHOL 172 10/29/2017   TRIG 54.0 10/29/2017   HDL 61.00 10/29/2017   LDLDIRECT 143.7 11/12/2013   LDLCALC 100 (H) 10/29/2017   ALT 6 04/09/2018   AST 10 04/09/2018   NA 138 04/09/2018   K 4.0 04/09/2018   CL 104 04/09/2018   CREATININE 0.90 04/09/2018   BUN 10 04/09/2018   CO2 27 04/09/2018   TSH 0.95 04/09/2018   HGBA1C 6.0 10/29/2017   MICROALBUR <0.7 10/29/2017    Lab Results  Component Value Date   TSH 0.95 04/09/2018   Lab Results  Component Value Date   WBC 7.1 04/09/2018   HGB 13.1 04/09/2018   HCT 39.0 04/09/2018   MCV 98.3 04/09/2018   PLT 268.0 04/09/2018   Lab Results  Component Value Date   NA 138 04/09/2018   K 4.0 04/09/2018   CO2 27 04/09/2018   GLUCOSE 95 04/09/2018   BUN 10 04/09/2018   CREATININE 0.90 04/09/2018   BILITOT 0.4 04/09/2018   ALKPHOS 48 04/09/2018   AST 10 04/09/2018   ALT 6 04/09/2018   PROT 7.2 04/09/2018   ALBUMIN 4.3 04/09/2018   CALCIUM 9.4 04/09/2018   ANIONGAP 10 04/07/2018   GFR 86.83 04/09/2018   Lab Results  Component Value Date   CHOL 172 10/29/2017   Lab Results  Component Value Date   HDL 61.00 10/29/2017    Lab Results  Component Value Date   LDLCALC 100 (H) 10/29/2017   Lab Results  Component Value Date   TRIG 54.0 10/29/2017   Lab Results  Component Value Date   CHOLHDL 3 10/29/2017   Lab Results  Component Value Date   HGBA1C 6.0 10/29/2017         Assessment & Plan:   Problem List Items Addressed This Visit    None    Visit Diagnoses    Tachycardia    -  Primary   Relevant Orders   EKG 12-Lead (Completed)   Thyroid Panel With TSH (Completed)   ECHOCARDIOGRAM COMPLETE   Cardiac event monitor   Tick bite of abdomen, initial encounter       Relevant Medications   doxycycline (VIBRA-TABS) 100 MG tablet   Other Relevant Orders   B. burgdorfi antibodies (Completed)   Rocky mtn spotted fvr ab, IgM-blood   Fatigue, unspecified type       Relevant Orders   CBC with Differential/Platelet (Completed)   Comprehensive metabolic panel (Completed)   Thyroid Panel With TSH (Completed)   Vitamin B12 (Completed)   Vitamin D 1,25 dihydroxy   B. burgdorfi antibodies (Completed)   Rocky mtn spotted fvr ab, IgM-blood      I am having Emily Maddox start on doxycycline. I am also having her maintain her Probiotic Product (PROBIOTIC DAILY PO), simvastatin, ONETOUCH VERIO FLEX SYSTEM, glucose blood, ONETOUCH DELICA LANCETS 12Y, and HYDROcodone-acetaminophen.  Meds ordered this encounter  Medications  . doxycycline (VIBRA-TABS) 100 MG tablet    Sig: Take 1 tablet (100 mg total) by mouth 2 (two) times daily.    Dispense:  20 tablet    Refill:  0    .......Marland Kitchen  CMA served as Education administrator during this visit. History, Physical and Plan performed by medical provider. Documentation and orders reviewed and attested to.  [name], DO

## 2018-04-10 LAB — CBC WITH DIFFERENTIAL/PLATELET
BASOS ABS: 0.1 10*3/uL (ref 0.0–0.1)
Basophils Relative: 1.1 % (ref 0.0–3.0)
Eosinophils Absolute: 0.1 10*3/uL (ref 0.0–0.7)
Eosinophils Relative: 0.9 % (ref 0.0–5.0)
HCT: 39 % (ref 36.0–46.0)
Hemoglobin: 13.1 g/dL (ref 12.0–15.0)
LYMPHS ABS: 1.7 10*3/uL (ref 0.7–4.0)
Lymphocytes Relative: 23.9 % (ref 12.0–46.0)
MCHC: 33.6 g/dL (ref 30.0–36.0)
MCV: 98.3 fl (ref 78.0–100.0)
Monocytes Absolute: 0.4 10*3/uL (ref 0.1–1.0)
Monocytes Relative: 6.1 % (ref 3.0–12.0)
NEUTROS PCT: 68 % (ref 43.0–77.0)
Neutro Abs: 4.9 10*3/uL (ref 1.4–7.7)
PLATELETS: 268 10*3/uL (ref 150.0–400.0)
RBC: 3.96 Mil/uL (ref 3.87–5.11)
RDW: 13.4 % (ref 11.5–15.5)
WBC: 7.1 10*3/uL (ref 4.0–10.5)

## 2018-04-10 LAB — COMPREHENSIVE METABOLIC PANEL
ALT: 6 U/L (ref 0–35)
AST: 10 U/L (ref 0–37)
Albumin: 4.3 g/dL (ref 3.5–5.2)
Alkaline Phosphatase: 48 U/L (ref 39–117)
BILIRUBIN TOTAL: 0.4 mg/dL (ref 0.2–1.2)
BUN: 10 mg/dL (ref 6–23)
CO2: 27 meq/L (ref 19–32)
CREATININE: 0.9 mg/dL (ref 0.40–1.20)
Calcium: 9.4 mg/dL (ref 8.4–10.5)
Chloride: 104 mEq/L (ref 96–112)
GFR: 86.83 mL/min (ref >60.00)
GLUCOSE: 95 mg/dL (ref 70–99)
Potassium: 4 mEq/L (ref 3.5–5.1)
SODIUM: 138 meq/L (ref 135–145)
Total Protein: 7.2 g/dL (ref 6.0–8.3)

## 2018-04-10 LAB — VITAMIN B12: Vitamin B-12: 222 pg/mL (ref 211–911)

## 2018-04-11 ENCOUNTER — Encounter: Payer: Self-pay | Admitting: Family Medicine

## 2018-04-11 LAB — VITAMIN D 1,25 DIHYDROXY
VITAMIN D3 1, 25 (OH): 45 pg/mL
Vitamin D 1, 25 (OH)2 Total: 53 pg/mL (ref 18–72)
Vitamin D2 1, 25 (OH)2: 8 pg/mL

## 2018-04-11 LAB — THYROID PANEL WITH TSH
FREE THYROXINE INDEX: 3.3 (ref 1.4–3.8)
T3 UPTAKE: 33 % (ref 22–35)
T4, Total: 10.1 ug/dL (ref 5.1–11.9)
TSH: 0.95 mIU/L

## 2018-04-11 LAB — B. BURGDORFI ANTIBODIES: B burgdorferi Ab IgG+IgM: <0.9 index

## 2018-04-12 ENCOUNTER — Telehealth: Payer: Self-pay | Admitting: Family Medicine

## 2018-04-12 ENCOUNTER — Ambulatory Visit (INDEPENDENT_AMBULATORY_CARE_PROVIDER_SITE_OTHER): Payer: Medicare HMO

## 2018-04-12 DIAGNOSIS — E538 Deficiency of other specified B group vitamins: Secondary | ICD-10-CM | POA: Diagnosis not present

## 2018-04-12 MED ORDER — CYANOCOBALAMIN 1000 MCG/ML IJ SOLN
1000.0000 ug | Freq: Once | INTRAMUSCULAR | Status: AC
  Administered 2018-04-12: 1000 ug via INTRAMUSCULAR

## 2018-04-12 NOTE — Telephone Encounter (Signed)
Waiting for other tests to be done--- can try toprol xl 25 mg  1/2 po qd  #15  2 refills If symptoms worsen over the weekend -- go to ER

## 2018-04-12 NOTE — Telephone Encounter (Signed)
Patient notified and put on nurse schedule for injection.

## 2018-04-12 NOTE — Progress Notes (Signed)
Pre visit review using our clinic tool,if applicable. No additional management support is needed unless otherwise documented below in the visit note.   Pt here for monthly B12 injection per order from [facility].  B12 1000 mcg given IM left deltoid, and pt tolerated injection well.  Next B12 injection scheduled for April 19, 2018.

## 2018-04-12 NOTE — Telephone Encounter (Signed)
Copied from [place] [id number] [phone]. Topic: Quick Communication - See Telephone Encounter >> Apr 12, 2018 10:03 AM Robina Ade, Helene Kelp D wrote: CRM for notification. See Telephone encounter for: 04/12/18. Patient would like to come in for her first B12 injection today and would like to know if this is ok with Dr. Etter Sjogren. Can her CMA give her a call back thanks.

## 2018-04-14 DIAGNOSIS — R42 Dizziness and giddiness: Secondary | ICD-10-CM | POA: Diagnosis not present

## 2018-04-15 MED ORDER — METOPROLOL SUCCINATE ER 25 MG PO TB24: 12.5000 mg | ORAL_TABLET | Freq: Every day | ORAL | 2 refills | Status: DC

## 2018-04-16 ENCOUNTER — Telehealth: Payer: Self-pay

## 2018-04-16 ENCOUNTER — Other Ambulatory Visit: Payer: Self-pay

## 2018-04-16 DIAGNOSIS — R5383 Other fatigue: Secondary | ICD-10-CM

## 2018-04-16 NOTE — Telephone Encounter (Signed)
Pt would like to know if it will be safe for her to fly on a plane. She will be leaving July 10th but wanted to make sure Dr. Etter Sjogren feels it's safe for her to fly. Please advise.

## 2018-04-16 NOTE — Telephone Encounter (Signed)
Is she feeling any better?

## 2018-04-17 NOTE — Telephone Encounter (Signed)
Feeling much better and she went to another provider an they gave her some steroids.  She think she may have had some sinus issues.  She has not had anymore spells.  She is going out of town for work.

## 2018-04-18 ENCOUNTER — Ambulatory Visit
Admission: RE | Admit: 2018-04-18 | Discharge: 2018-04-18 | Disposition: A | Discharge status: Home / Self Care | Payer: Medicare HMO | Source: Ambulatory Visit | Attending: Family Medicine | Admitting: Family Medicine

## 2018-04-18 DIAGNOSIS — Z1231 Encounter for screening mammogram for malignant neoplasm of breast: Secondary | ICD-10-CM

## 2018-04-18 NOTE — Telephone Encounter (Signed)
Great!

## 2018-04-19 ENCOUNTER — Ambulatory Visit (INDEPENDENT_AMBULATORY_CARE_PROVIDER_SITE_OTHER): Payer: Medicare HMO

## 2018-04-19 DIAGNOSIS — E538 Deficiency of other specified B group vitamins: Secondary | ICD-10-CM

## 2018-04-19 MED ORDER — CYANOCOBALAMIN 1000 MCG/ML IJ SOLN
1000.0000 ug | Freq: Once | INTRAMUSCULAR | Status: AC
  Administered 2018-04-19: 1000 ug via INTRAMUSCULAR

## 2018-04-19 NOTE — Progress Notes (Signed)
Pre visit review using our clinic tool,if applicable. No additional management support is needed unless otherwise documented below in the visit note.   Pt here for monthly B12 injection per   B12 1036mcg given IM Right deltoid, and pt tolerated injection well.  No complaints voiced this visit.  Next B12 injection scheduled for April 26, 2018

## 2018-04-26 ENCOUNTER — Ambulatory Visit: Payer: Medicare HMO

## 2018-04-30 ENCOUNTER — Ambulatory Visit (INDEPENDENT_AMBULATORY_CARE_PROVIDER_SITE_OTHER): Payer: Medicare HMO

## 2018-04-30 ENCOUNTER — Ambulatory Visit (HOSPITAL_BASED_OUTPATIENT_CLINIC_OR_DEPARTMENT_OTHER): Payer: Medicare HMO

## 2018-04-30 DIAGNOSIS — E538 Deficiency of other specified B group vitamins: Secondary | ICD-10-CM | POA: Diagnosis not present

## 2018-04-30 MED ORDER — CYANOCOBALAMIN 1000 MCG/ML IJ SOLN
1000.0000 ug | Freq: Once | INTRAMUSCULAR | Status: AC
  Administered 2018-04-30: 1000 ug via INTRAMUSCULAR

## 2018-04-30 NOTE — Progress Notes (Signed)
Reviewed  Yvonne R Lowne Chase, DO  

## 2018-04-30 NOTE — Progress Notes (Signed)
Pre visit review using our clinic tool,if applicable. No additional management support is needed unless otherwise documented below in the visit note.   Pt here for monthly B12 injection per ORDER FROM DrMarland Kitchen Carollee Herter  B12 1047mcg given IM left deltoid, and pt tolerated injection well.  No complaints voiced this visit.  Next B12 injection scheduled for May 07 2018 and patient will be seeing Dr. Carollee Herter for office visit.

## 2018-05-02 ENCOUNTER — Ambulatory Visit: Payer: Medicare Other | Admitting: Family Medicine

## 2018-05-07 ENCOUNTER — Ambulatory Visit: Payer: Medicare HMO | Admitting: Family Medicine

## 2018-05-08 ENCOUNTER — Ambulatory Visit: Payer: Medicare HMO

## 2018-05-09 ENCOUNTER — Ambulatory Visit (INDEPENDENT_AMBULATORY_CARE_PROVIDER_SITE_OTHER): Payer: Medicare HMO | Admitting: Family Medicine

## 2018-05-09 ENCOUNTER — Encounter: Payer: Self-pay | Admitting: Family Medicine

## 2018-05-09 VITALS — BP 108/78 | HR 100 | Temp 97.5°F | Resp 16 | Ht 66.0 in | Wt 159.0 lb

## 2018-05-09 DIAGNOSIS — E1165 Type 2 diabetes mellitus with hyperglycemia: Secondary | ICD-10-CM | POA: Diagnosis not present

## 2018-05-09 DIAGNOSIS — E1149 Type 2 diabetes mellitus with other diabetic neurological complication: Secondary | ICD-10-CM | POA: Diagnosis not present

## 2018-05-09 DIAGNOSIS — E538 Deficiency of other specified B group vitamins: Secondary | ICD-10-CM | POA: Diagnosis not present

## 2018-05-09 DIAGNOSIS — E1151 Type 2 diabetes mellitus with diabetic peripheral angiopathy without gangrene: Secondary | ICD-10-CM | POA: Diagnosis not present

## 2018-05-09 DIAGNOSIS — IMO0002 Reserved for concepts with insufficient information to code with codable children: Secondary | ICD-10-CM

## 2018-05-09 DIAGNOSIS — M797 Fibromyalgia: Secondary | ICD-10-CM

## 2018-05-09 DIAGNOSIS — Z79899 Other long term (current) drug therapy: Secondary | ICD-10-CM | POA: Diagnosis not present

## 2018-05-09 DIAGNOSIS — G894 Chronic pain syndrome: Secondary | ICD-10-CM | POA: Diagnosis not present

## 2018-05-09 DIAGNOSIS — E785 Hyperlipidemia, unspecified: Secondary | ICD-10-CM

## 2018-05-09 DIAGNOSIS — E1169 Type 2 diabetes mellitus with other specified complication: Secondary | ICD-10-CM | POA: Diagnosis not present

## 2018-05-09 DIAGNOSIS — M791 Myalgia, unspecified site: Secondary | ICD-10-CM | POA: Diagnosis not present

## 2018-05-09 MED ORDER — CYANOCOBALAMIN 1000 MCG/ML IJ SOLN
1000.0000 ug | Freq: Once | INTRAMUSCULAR | Status: AC
  Administered 2018-05-09: 1000 ug via INTRAMUSCULAR

## 2018-05-09 NOTE — Patient Instructions (Signed)

## 2018-05-09 NOTE — Progress Notes (Addendum)
Patient ID: Emily Maddox, female    DOB: Nov 04, 1970  Age: 47 y.o. MRN: [id number]    Subjective:  Subjective  HPI Emily Maddox presents for f/u chronic pain meds and fibromyalgia  She feels like she is getting worse and is wanting to see rheum again for re evaluation. She feels this is more than fibromyalgia.  She also needs her labs done and f/u dm, chol and bp.    Review of Systems  Constitutional: Negative for fever.  HENT: Negative for congestion.   Respiratory: Negative for shortness of breath.   Cardiovascular: Negative for chest pain, palpitations and leg swelling.  Gastrointestinal: Negative for abdominal pain, blood in stool and nausea.  Genitourinary: Negative for dysuria and frequency.  Musculoskeletal: Positive for arthralgias and myalgias.  Skin: Negative for rash.  Allergic/Immunologic: Negative for environmental allergies.  Neurological: Negative for dizziness and headaches.  Psychiatric/Behavioral: The patient is not nervous/anxious.     History Past Medical History:  Diagnosis Date  . 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 Diabetes mellitus   . Hyperlipidemia   . Tachycardia     She has a past surgical history that includes Breast excisional biopsy (Left, 2003).   Her family history includes Breast cancer in her unknown relative; Breast cancer (age of onset: 18) in her mother; Cancer (age of onset: 84) in her father; Cancer (age of onset: 40) in her mother; Colon cancer in her unknown relative; Diabetes in her father, mother, and sister; Heart disease in her father; Hyperlipidemia in her father and mother; Hypertension in her father and mother.She reports that she has been smoking cigarettes.  She has a 19.00 pack-year smoking history. She has never used smokeless tobacco. She reports that she does not drink alcohol or use drugs.  Current Outpatient Medications on File Prior to Visit  Medication Sig Dispense  Refill  . Blood Glucose Monitoring Suppl ([place]) w/Device KIT 1 each by Does not apply route daily. Dx code: E11.9 1 kit 0  . glucose blood (ONETOUCH VERIO) test strip Check sugar every day.  Dx Code: E11.9 100 each 12  . HYDROcodone-acetaminophen (NORCO) 10-325 MG tablet Take 1 tablet by mouth every 8 (eight) hours as needed. 60 tablet 0  . metoprolol succinate (TOPROL-XL) 25 MG 24 hr tablet Take 0.5 tablets (12.5 mg total) by mouth daily. 15 tablet 2  . ONETOUCH DELICA LANCETS 73Z MISC Check sugar every day.  Dx Code: E11.9 100 each 1  . Probiotic Product (PROBIOTIC DAILY PO) Take 1 capsule by mouth daily.    . simvastatin (ZOCOR) 20 MG tablet TAKE 1 TABLET BY MOUTH ONCE DAILY 90 tablet 0   No current facility-administered medications on file prior to visit.      Objective:  Objective  Physical Exam  Constitutional: She is oriented to person, place, and time. She appears well-developed and well-nourished.  HENT:  Head: Normocephalic and atraumatic.  Eyes: Conjunctivae and EOM are normal.  Neck: Normal range of motion. Neck supple. No JVD present. Carotid bruit is not present. No thyromegaly present.  Cardiovascular: Normal rate, regular rhythm and normal heart sounds.  No murmur heard. Pulmonary/Chest: Effort normal and breath sounds normal. No respiratory distress. She has no wheezes. She has no rales. She exhibits no tenderness.  Musculoskeletal: She exhibits tenderness. She exhibits no edema or deformity.  Neurological: She is alert and oriented to person, place, and time. She displays normal reflexes.  Psychiatric:  She has a normal mood and affect.  Nursing note and vitals reviewed.  BP 108/78 (BP Location: Right Arm, Cuff Size: Normal)   Pulse 100   Temp (!) 97.5 F (36.4 C) (Oral)   Resp 16   Ht 5' 6" (1.676 m)   Wt 159 lb (72.1 kg)   LMP 05/06/2018   SpO2 98%   BMI 25.66 kg/m  Wt Readings from Last 3 Encounters:  05/09/18 159 lb (72.1 kg)    04/09/18 155 lb 9.6 oz (70.6 kg)  04/07/18 158 lb (71.7 kg)     Lab Results  Component Value Date   WBC 7.1 04/09/2018   HGB 13.1 04/09/2018   HCT 39.0 04/09/2018   PLT 268.0 04/09/2018   GLUCOSE 95 04/09/2018   CHOL 172 10/29/2017   TRIG 54.0 10/29/2017   HDL 61.00 10/29/2017   LDLDIRECT 143.7 11/12/2013   LDLCALC 100 (H) 10/29/2017   ALT 6 04/09/2018   AST 10 04/09/2018   NA 138 04/09/2018   K 4.0 04/09/2018   CL 104 04/09/2018   CREATININE 0.90 04/09/2018   BUN 10 04/09/2018   CO2 27 04/09/2018   TSH 0.95 04/09/2018   HGBA1C 6.0 10/29/2017   MICROALBUR <0.7 10/29/2017    Mm 3d Screen Breast Bilateral  Result Date: 04/18/2018 CLINICAL DATA:  Screening. EXAM: DIGITAL SCREENING BILATERAL MAMMOGRAM WITH TOMO AND CAD COMPARISON:  Previous exam(s). ACR Breast Density Category b: There are scattered areas of fibroglandular density. FINDINGS: There are no findings suspicious for malignancy. Images were processed with CAD. IMPRESSION: No mammographic evidence of malignancy. A result letter of this screening mammogram will be mailed directly to the patient. RECOMMENDATION: Screening mammogram in one year. (Code:SM-B-01Y) BI-RADS CATEGORY  1: Negative. Electronically Signed   By: Wei-Chen  Lin M.D.   On: 04/18/2018 10:09     Assessment & Plan:  Plan  I have discontinued Emily Maddox's doxycycline. I am also having her maintain her Probiotic Product (PROBIOTIC DAILY PO), simvastatin, ONETOUCH VERIO FLEX SYSTEM, glucose blood, ONETOUCH DELICA LANCETS 33G, HYDROcodone-acetaminophen, and metoprolol succinate. We administered cyanocobalamin.  Meds ordered this encounter  Medications  . cyanocobalamin ((VITAMIN B-12)) injection 1,000 mcg    Problem List Items Addressed This Visit      Unprioritized   B12 deficiency    Check labs today      Relevant Medications   cyanocobalamin ((VITAMIN B-12)) injection 1,000 mcg (Completed)   Chronic pain syndrome    con't meds for  now uds , contract utd      Relevant Orders   Pain Mgmt, Profile 8 w/Conf, U   Diabetes mellitus type II, controlled (HCC)    Check labs today hgba1c to be checked, minimize simple carbs. Increase exercise as tolerated. Continue current meds       DM (diabetes mellitus) type II uncontrolled, periph vascular disorder (HCC)   Relevant Orders   Comprehensive metabolic panel   Hemoglobin A1c   Fibromyalgia    Pt feels its not fibro Requesting referral to rheum ? PMR Prednisone made her fell much better      High risk medication use   Relevant Orders   Pain Mgmt, Profile 8 w/Conf, U   Hyperlipidemia associated with type 2 diabetes mellitus (HCC)    Check labs today Tolerating statin, encouraged heart healthy diet, avoid trans fats, minimize simple carbs and saturated fats. Increase exercise as tolerated      Relevant Orders   Lipid panel   Comprehensive metabolic panel     Myalgia - Primary   Relevant Orders   Ambulatory referral to Rheumatology      Follow-up: No follow-ups on file.  Yvonne R Lowne Chase, DO     

## 2018-05-10 DIAGNOSIS — Z79899 Other long term (current) drug therapy: Secondary | ICD-10-CM | POA: Insufficient documentation

## 2018-05-10 DIAGNOSIS — E538 Deficiency of other specified B group vitamins: Secondary | ICD-10-CM | POA: Insufficient documentation

## 2018-05-10 DIAGNOSIS — E1165 Type 2 diabetes mellitus with hyperglycemia: Secondary | ICD-10-CM

## 2018-05-10 DIAGNOSIS — G894 Chronic pain syndrome: Secondary | ICD-10-CM | POA: Insufficient documentation

## 2018-05-10 DIAGNOSIS — M791 Myalgia, unspecified site: Secondary | ICD-10-CM | POA: Insufficient documentation

## 2018-05-10 DIAGNOSIS — E1151 Type 2 diabetes mellitus with diabetic peripheral angiopathy without gangrene: Secondary | ICD-10-CM | POA: Insufficient documentation

## 2018-05-10 DIAGNOSIS — IMO0002 Reserved for concepts with insufficient information to code with codable children: Secondary | ICD-10-CM | POA: Insufficient documentation

## 2018-05-10 DIAGNOSIS — E785 Hyperlipidemia, unspecified: Secondary | ICD-10-CM

## 2018-05-10 DIAGNOSIS — E1169 Type 2 diabetes mellitus with other specified complication: Secondary | ICD-10-CM | POA: Insufficient documentation

## 2018-05-10 NOTE — Assessment & Plan Note (Signed)
Pt feels its not fibro Requesting referral to rheum ? PMR Prednisone made her fell much better

## 2018-05-10 NOTE — Assessment & Plan Note (Signed)
Check labs today hgba1c to be checked , minimize simple carbs. Increase exercise as tolerated. Continue current meds  

## 2018-05-10 NOTE — Assessment & Plan Note (Signed)
Check labs today Tolerating statin, encouraged heart healthy diet, avoid trans fats, minimize simple carbs and saturated fats. Increase exercise as tolerated 

## 2018-05-10 NOTE — Assessment & Plan Note (Signed)
Check labs today.

## 2018-05-10 NOTE — Assessment & Plan Note (Signed)
con't meds for now uds , contract utd

## 2018-05-10 NOTE — Addendum Note (Signed)
Addended by: Roma Schanz R on: 05/10/2018 05:05 PM   Modules accepted: Level of Service

## 2018-05-12 LAB — PAIN MGMT, PROFILE 8 W/CONF, U
6 ACETYLMORPHINE: NEGATIVE ng/mL (ref <10)
ALCOHOL METABOLITES: NEGATIVE ng/mL (ref <500)
Amphetamines: NEGATIVE ng/mL (ref <500)
BENZODIAZEPINES: NEGATIVE ng/mL (ref <100)
Buprenorphine, Urine: NEGATIVE ng/mL (ref <5)
COCAINE METABOLITE: NEGATIVE ng/mL (ref <150)
CREATININE: 142.4 mg/dL
Codeine: NEGATIVE ng/mL (ref <50)
HYDROMORPHONE: 406 ng/mL — AB (ref <50)
Hydrocodone: 898 ng/mL — ABNORMAL HIGH (ref <50)
MDMA: NEGATIVE ng/mL (ref <500)
Marijuana Metabolite: NEGATIVE ng/mL (ref <20)
Morphine: NEGATIVE ng/mL (ref <50)
NORHYDROCODONE: 2175 ng/mL — AB (ref <50)
Opiates: POSITIVE ng/mL — AB (ref <100)
Oxidant: NEGATIVE ug/mL (ref <200)
Oxycodone: NEGATIVE ng/mL (ref <100)
pH: 6.21 (ref 4.5–9.0)

## 2018-05-13 ENCOUNTER — Other Ambulatory Visit (INDEPENDENT_AMBULATORY_CARE_PROVIDER_SITE_OTHER): Payer: Medicare HMO

## 2018-05-13 DIAGNOSIS — R5383 Other fatigue: Secondary | ICD-10-CM

## 2018-05-13 LAB — COMPREHENSIVE METABOLIC PANEL
ALBUMIN: 4.2 g/dL (ref 3.5–5.2)
ALT: 7 U/L (ref 0–35)
AST: 9 U/L (ref 0–37)
Alkaline Phosphatase: 49 U/L (ref 39–117)
BILIRUBIN TOTAL: 0.4 mg/dL (ref 0.2–1.2)
BUN: 11 mg/dL (ref 6–23)
CALCIUM: 9.2 mg/dL (ref 8.4–10.5)
CO2: 27 mEq/L (ref 19–32)
CREATININE: 0.99 mg/dL (ref 0.40–1.20)
Chloride: 104 mEq/L (ref 96–112)
GFR: 77.75 mL/min (ref >60.00)
Glucose, Bld: 95 mg/dL (ref 70–99)
Potassium: 4.5 mEq/L (ref 3.5–5.1)
SODIUM: 138 meq/L (ref 135–145)
Total Protein: 7.2 g/dL (ref 6.0–8.3)

## 2018-05-13 LAB — LIPID PANEL
CHOLESTEROL: 202 mg/dL — AB (ref 0–200)
HDL: 68.4 mg/dL (ref >39.00)
LDL Cholesterol: 123 mg/dL — ABNORMAL HIGH (ref 0–99)
NonHDL: 133.72
TRIGLYCERIDES: 52 mg/dL (ref 0.0–149.0)
Total CHOL/HDL Ratio: 3
VLDL: 10.4 mg/dL (ref 0.0–40.0)

## 2018-05-13 LAB — HEMOGLOBIN A1C: Hgb A1c MFr Bld: 5.9 % (ref 4.6–6.5)

## 2018-05-13 LAB — VITAMIN B12: VITAMIN B 12: 965 pg/mL — AB (ref 211–911)

## 2018-05-14 MED ORDER — SIMVASTATIN 40 MG PO TABS: 40.0000 mg | ORAL_TABLET | Freq: Every day | ORAL | 2 refills | Status: DC

## 2018-05-14 NOTE — Addendum Note (Signed)
Addended byDamita Dunnings D on: 05/14/2018 08:16 AM   Modules accepted: Orders

## 2018-05-15 ENCOUNTER — Ambulatory Visit (HOSPITAL_BASED_OUTPATIENT_CLINIC_OR_DEPARTMENT_OTHER)
Admission: RE | Admit: 2018-05-15 | Discharge: 2018-05-15 | Disposition: A | Discharge status: Home / Self Care | Payer: Medicare HMO | Source: Ambulatory Visit | Attending: Family Medicine | Admitting: Family Medicine

## 2018-05-15 DIAGNOSIS — R000 Tachycardia, unspecified: Secondary | ICD-10-CM

## 2018-05-15 DIAGNOSIS — I340 Nonrheumatic mitral (valve) insufficiency: Secondary | ICD-10-CM | POA: Diagnosis not present

## 2018-05-15 DIAGNOSIS — E785 Hyperlipidemia, unspecified: Secondary | ICD-10-CM | POA: Insufficient documentation

## 2018-05-15 DIAGNOSIS — Z720 Tobacco use: Secondary | ICD-10-CM | POA: Insufficient documentation

## 2018-05-15 DIAGNOSIS — E119 Type 2 diabetes mellitus without complications: Secondary | ICD-10-CM | POA: Insufficient documentation

## 2018-05-15 NOTE — Progress Notes (Signed)
  Echocardiogram 2D Echocardiogram has been performed.  Kendalynn Wideman T Xzavier Swinger 05/15/2018, 11:37 AM

## 2018-05-20 ENCOUNTER — Other Ambulatory Visit: Payer: Self-pay | Admitting: Family Medicine

## 2018-05-20 DIAGNOSIS — G894 Chronic pain syndrome: Secondary | ICD-10-CM

## 2018-05-20 DIAGNOSIS — G8929 Other chronic pain: Secondary | ICD-10-CM

## 2018-05-20 NOTE — Telephone Encounter (Signed)
Copied from [place] [id number]. Topic: Quick Communication - See Telephone Encounter >> May 20, 2018  9:18 AM Hewitt Shorts wrote: Pt is needing a refill on hydrocodone   [facility]  Best number [phone]

## 2018-05-20 NOTE — Telephone Encounter (Signed)
Hydrocodone  LRF 01/28/18  #60  0 refills  LOV 05/06/18 Dr. Arlyce Harman

## 2018-05-21 MED ORDER — HYDROCODONE-ACETAMINOPHEN 10-325 MG PO TABS: 1.0000 | ORAL_TABLET | Freq: Three times a day (TID) | ORAL | 0 refills | Status: DC | PRN

## 2018-05-21 NOTE — Telephone Encounter (Signed)
Requesting: Norco 10/325mg  every 8hr prn Contract: 10/29/17 UDS: 05/09/18 Last OV: 05/09/18 Next Ov: N/A Last refill: 01/28/18, #60, 0RF Database: no discrepancies noted  Please advise.

## 2018-05-27 DIAGNOSIS — R69 Illness, unspecified: Secondary | ICD-10-CM | POA: Diagnosis not present

## 2018-06-05 DIAGNOSIS — R69 Illness, unspecified: Secondary | ICD-10-CM | POA: Diagnosis not present

## 2018-06-06 DIAGNOSIS — D251 Intramural leiomyoma of uterus: Secondary | ICD-10-CM | POA: Diagnosis not present

## 2018-06-06 DIAGNOSIS — N393 Stress incontinence (female) (male): Secondary | ICD-10-CM | POA: Diagnosis not present

## 2018-06-10 DIAGNOSIS — R142 Eructation: Secondary | ICD-10-CM | POA: Diagnosis not present

## 2018-06-10 DIAGNOSIS — R350 Frequency of micturition: Secondary | ICD-10-CM | POA: Diagnosis not present

## 2018-06-13 DIAGNOSIS — Z800 Family history of malignant neoplasm of digestive organs: Secondary | ICD-10-CM | POA: Diagnosis not present

## 2018-06-13 DIAGNOSIS — K219 Gastro-esophageal reflux disease without esophagitis: Secondary | ICD-10-CM | POA: Diagnosis not present

## 2018-06-13 DIAGNOSIS — R634 Abnormal weight loss: Secondary | ICD-10-CM | POA: Diagnosis not present

## 2018-06-13 DIAGNOSIS — Z86010 Personal history of colonic polyps: Secondary | ICD-10-CM | POA: Diagnosis not present

## 2018-06-13 DIAGNOSIS — R140 Abdominal distension (gaseous): Secondary | ICD-10-CM | POA: Diagnosis not present

## 2018-06-24 ENCOUNTER — Other Ambulatory Visit: Payer: Self-pay | Admitting: Family Medicine

## 2018-06-24 DIAGNOSIS — G894 Chronic pain syndrome: Secondary | ICD-10-CM

## 2018-06-24 DIAGNOSIS — G8929 Other chronic pain: Secondary | ICD-10-CM

## 2018-06-25 ENCOUNTER — Other Ambulatory Visit: Payer: Self-pay | Admitting: Family Medicine

## 2018-06-25 MED ORDER — HYDROCODONE-ACETAMINOPHEN 10-325 MG PO TABS: 1.0000 | ORAL_TABLET | Freq: Three times a day (TID) | ORAL | 0 refills | Status: DC | PRN

## 2018-06-25 NOTE — Telephone Encounter (Signed)
Refill Request:    Last RX:05/21/18 Last OV:05/09/18 Next OV: None Scheduled  UDS:05/09/18 CSC:10/29/17 CSR:

## 2018-06-25 NOTE — Telephone Encounter (Signed)
Copied from [place] [phone]. Topic: Quick Communication - Rx Refill/Question >> Jun 25, 2018  9:35 AM Judyann Munson wrote: Medication: HYDROcodone-acetaminophen (NORCO) 10-325 MG tablet   Has the patient contacted their pharmacy? No  Preferred Pharmacy (with phone number or street name): [place], [place] [phone] (Phone) [phone] (Fax)  Agent: Please be advised that RX refills may take up to 3 business days. We ask that you follow-up with your pharmacy.

## 2018-06-25 NOTE — Telephone Encounter (Signed)
See rx request. responded to by other means.

## 2018-07-25 ENCOUNTER — Other Ambulatory Visit: Payer: Self-pay | Admitting: Family Medicine

## 2018-07-25 DIAGNOSIS — G894 Chronic pain syndrome: Secondary | ICD-10-CM

## 2018-07-25 DIAGNOSIS — G8929 Other chronic pain: Secondary | ICD-10-CM

## 2018-07-25 MED ORDER — HYDROCODONE-ACETAMINOPHEN 10-325 MG PO TABS: 1.0000 | ORAL_TABLET | Freq: Three times a day (TID) | ORAL | 0 refills | Status: DC | PRN

## 2018-07-25 NOTE — Telephone Encounter (Signed)
Copied from [place] [phone]. Topic: Quick Communication - Rx Refill/Question >> Jul 25, 2018  8:43 AM Emily Maddox wrote: Medication: HYDROcodone-acetaminophen (NORCO) 10-325 MG tablet  Has the patient contacted their pharmacy? NO (Agent: If no, request that the patient contact the pharmacy for the refill.) (Agent: If yes, when and what did the pharmacy advise?) no refills always tell her to call provider  Preferred Pharmacy (with phone number or street name): Emily Maddox, [place] [phone] (Phone) [phone] (Fax)    Agent: Please be advised that RX refills may take up to 3 business days. We ask that you follow-up with your pharmacy.

## 2018-07-25 NOTE — Telephone Encounter (Signed)
Requested medication (s) are due for refill today: yes  Requested medication (s) are on the active medication list: yes  Last refill:  06/25/18  Future visit scheduled: no  Notes to clinic:  See request    Requested Prescriptions  Pending Prescriptions Disp Refills   HYDROcodone-acetaminophen (NORCO) 10-325 MG tablet 60 tablet 0    Sig: Take 1 tablet by mouth every 8 (eight) hours as needed.     Not Delegated - Analgesics:  Opioid Agonist Combinations Failed - 07/25/2018  8:51 AM      Failed - This refill cannot be delegated      Failed - Urine Drug Screen completed in last 360 days.      Passed - Valid encounter within last 6 months    Recent Outpatient Visits          2 months ago [facility] [place] [facility] [place], [place]   3 months ago [place] [facility] [place], [place]   5 months ago Insomnia, unspecified type   [facility] [place], [place]   8 months ago Chronic pain syndrome   [facility] [place], [place]   1 year ago Other chronic pain   [facility] [place], [place], [place]

## 2018-08-10 DIAGNOSIS — R69 Illness, unspecified: Secondary | ICD-10-CM | POA: Diagnosis not present

## 2018-08-22 DIAGNOSIS — R69 Illness, unspecified: Secondary | ICD-10-CM | POA: Diagnosis not present

## 2018-08-27 ENCOUNTER — Ambulatory Visit (INDEPENDENT_AMBULATORY_CARE_PROVIDER_SITE_OTHER): Payer: Medicare HMO | Admitting: Family Medicine

## 2018-08-27 VITALS — BP 126/80 | HR 84 | Temp 98.7°F | Ht 66.0 in | Wt 157.2 lb

## 2018-08-27 DIAGNOSIS — I10 Essential (primary) hypertension: Secondary | ICD-10-CM | POA: Diagnosis not present

## 2018-08-27 DIAGNOSIS — G8929 Other chronic pain: Secondary | ICD-10-CM

## 2018-08-27 DIAGNOSIS — G894 Chronic pain syndrome: Secondary | ICD-10-CM

## 2018-08-27 DIAGNOSIS — E1165 Type 2 diabetes mellitus with hyperglycemia: Secondary | ICD-10-CM

## 2018-08-27 DIAGNOSIS — E1169 Type 2 diabetes mellitus with other specified complication: Secondary | ICD-10-CM | POA: Diagnosis not present

## 2018-08-27 DIAGNOSIS — E785 Hyperlipidemia, unspecified: Secondary | ICD-10-CM

## 2018-08-27 DIAGNOSIS — E1151 Type 2 diabetes mellitus with diabetic peripheral angiopathy without gangrene: Secondary | ICD-10-CM

## 2018-08-27 DIAGNOSIS — IMO0002 Reserved for concepts with insufficient information to code with codable children: Secondary | ICD-10-CM

## 2018-08-27 DIAGNOSIS — R739 Hyperglycemia, unspecified: Secondary | ICD-10-CM | POA: Diagnosis not present

## 2018-08-27 MED ORDER — HYDROCODONE-ACETAMINOPHEN 10-325 MG PO TABS: 1.0000 | ORAL_TABLET | Freq: Three times a day (TID) | ORAL | 0 refills | Status: DC | PRN

## 2018-08-27 NOTE — Progress Notes (Signed)
Patient ID: Emily Maddox, female    DOB: [birth year]  Age: 47 y.o. MRN: [id number]    Subjective:  Subjective  HPI Emily Maddox presents for f/u pain meds.  Pain is controlled with hydrocodone--- she only takes 1-1 1/2 a day   She is also here for f/u bp and cholesterol.   No other complaints    Review of Systems  Constitutional: Negative for appetite change, diaphoresis, fatigue and unexpected weight change.  Eyes: Negative for pain, redness and visual disturbance.  Respiratory: Negative for cough, chest tightness, shortness of breath and wheezing.   Cardiovascular: Negative for chest pain, palpitations and leg swelling.  Endocrine: Negative for cold intolerance, heat intolerance, polydipsia, polyphagia and polyuria.  Genitourinary: Negative for difficulty urinating, dysuria and frequency.  Musculoskeletal: Positive for arthralgias, back pain and myalgias.  Neurological: Negative for dizziness, light-headedness, numbness and headaches.    History Past Medical History:  Diagnosis Date  . 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 Diabetes mellitus   . Hyperlipidemia   . Tachycardia     She has a past surgical history that includes Breast excisional biopsy (Left, 2003).   Her family history includes Breast cancer in her unknown relative; Breast cancer (age of onset: 33) in her mother; Cancer (age of onset: 92) in her father; Cancer (age of onset: 44) in her mother; Colon cancer in her unknown relative; Diabetes in her father, mother, and sister; Heart disease in her father; Hyperlipidemia in her father and mother; Hypertension in her father and mother.She reports that she has been smoking cigarettes. She has a 19.00 pack-year smoking history. She has never used smokeless tobacco. She reports that she does not drink alcohol or use drugs.  Current Outpatient Medications on File Prior to Visit  Medication Sig Dispense Refill  . Blood  Glucose Monitoring Suppl ([place]) w/Device KIT 1 each by Does not apply route daily. Dx code: E11.9 1 kit 0  . glucose blood (ONETOUCH VERIO) test strip Check sugar every day.  Dx Code: E11.9 100 each 12  . metoprolol succinate (TOPROL-XL) 25 MG 24 hr tablet Take 0.5 tablets (12.5 mg total) by mouth daily. 15 tablet 2  . ONETOUCH DELICA LANCETS 37T MISC Check sugar every day.  Dx Code: E11.9 100 each 1  . Probiotic Product (PROBIOTIC DAILY PO) Take 1 capsule by mouth daily.    . simvastatin (ZOCOR) 40 MG tablet Take 1 tablet (40 mg total) by mouth at bedtime. 30 tablet 2   No current facility-administered medications on file prior to visit.      Objective:  Objective  Physical Exam  Constitutional: She is oriented to person, place, and time. She appears well-developed and well-nourished.  HENT:  Head: Normocephalic and atraumatic.  Eyes: Conjunctivae and EOM are normal.  Neck: Normal range of motion. Neck supple. No JVD present. Carotid bruit is not present. No thyromegaly present.  Cardiovascular: Normal rate, regular rhythm and normal heart sounds.  No murmur heard. Pulmonary/Chest: Effort normal and breath sounds normal. No respiratory distress. She has no wheezes. She has no rales. She exhibits no tenderness.  Musculoskeletal: She exhibits tenderness. She exhibits no edema or deformity.  Neurological: She is alert and oriented to person, place, and time. She displays normal reflexes. She exhibits normal muscle tone.  Psychiatric: She has a normal mood and affect.  Nursing note and vitals reviewed.  There were no vitals taken for this visit. Wt  Readings from Last 3 Encounters:  05/09/18 159 lb (72.1 kg)  04/09/18 155 lb 9.6 oz (70.6 kg)  04/07/18 158 lb (71.7 kg)     Lab Results  Component Value Date   WBC 7.1 04/09/2018   HGB 13.1 04/09/2018   HCT 39.0 04/09/2018   PLT 268.0 04/09/2018   GLUCOSE 89 08/27/2018   CHOL 205 (H) 08/27/2018   TRIG 53.0  08/27/2018   HDL 65.30 08/27/2018   LDLDIRECT 143.7 11/12/2013   LDLCALC 129 (H) 08/27/2018   ALT 4 08/27/2018   AST 11 08/27/2018   NA 137 08/27/2018   K 4.7 08/27/2018   CL 105 08/27/2018   CREATININE 0.89 08/27/2018   BUN 13 08/27/2018   CO2 26 08/27/2018   TSH 0.95 04/09/2018   HGBA1C 6.0 08/27/2018   MICROALBUR <0.7 10/29/2017    No results found.   Assessment & Plan:  Plan  I am having Emily Maddox maintain her Probiotic Product (PROBIOTIC DAILY PO), ONETOUCH VERIO FLEX SYSTEM, glucose blood, ONETOUCH DELICA LANCETS 41L, metoprolol succinate, simvastatin, and HYDROcodone-acetaminophen.  Meds ordered this encounter  Medications  . HYDROcodone-acetaminophen (NORCO) 10-325 MG tablet    Sig: Take 1 tablet by mouth every 8 (eight) hours as needed.    Dispense:  60 tablet    Refill:  0    Do not fill until 02/16/2018    Problem List Items Addressed This Visit      Unprioritized   Chronic pain syndrome    Stable Refill pain meds       Relevant Medications   HYDROcodone-acetaminophen (NORCO) 10-325 MG tablet   DM (diabetes mellitus) type II uncontrolled, periph vascular disorder (HCC)    Check labs       Essential hypertension   Relevant Orders   Hemoglobin A1c (Completed)   Comprehensive metabolic panel (Completed)   Lipid panel (Completed)   Hyperglycemia - Primary   Relevant Orders   Hemoglobin A1c (Completed)   Comprehensive metabolic panel (Completed)   Lipid panel (Completed)   Hyperlipidemia    Tolerating statin, encouraged heart healthy diet, avoid trans fats, minimize simple carbs and saturated fats. Increase exercise as tolerated      Hyperlipidemia associated with type 2 diabetes mellitus ([place])    Tolerating statin, encouraged heart healthy diet, avoid trans fats, minimize simple carbs and saturated fats. Increase exercise as tolerated       Other Visit Diagnoses    Other chronic pain       Relevant Medications    HYDROcodone-acetaminophen (NORCO) 10-325 MG tablet      Follow-up: Return in about 3 months (around 11/27/2018), or if symptoms worsen or fail to improve.  [name], DO

## 2018-08-27 NOTE — Patient Instructions (Signed)

## 2018-08-28 LAB — COMPREHENSIVE METABOLIC PANEL
ALT: 4 U/L (ref 0–35)
AST: 11 U/L (ref 0–37)
Albumin: 4.2 g/dL (ref 3.5–5.2)
Alkaline Phosphatase: 42 U/L (ref 39–117)
BUN: 13 mg/dL (ref 6–23)
CHLORIDE: 105 meq/L (ref 96–112)
CO2: 26 meq/L (ref 19–32)
Calcium: 9.1 mg/dL (ref 8.4–10.5)
Creatinine, Ser: 0.89 mg/dL (ref 0.40–1.20)
GFR: 87.81 mL/min (ref >60.00)
GLUCOSE: 89 mg/dL (ref 70–99)
Potassium: 4.7 mEq/L (ref 3.5–5.1)
Sodium: 137 mEq/L (ref 135–145)
Total Bilirubin: 0.3 mg/dL (ref 0.2–1.2)
Total Protein: 6.8 g/dL (ref 6.0–8.3)

## 2018-08-28 LAB — LIPID PANEL
CHOL/HDL RATIO: 3
Cholesterol: 205 mg/dL — ABNORMAL HIGH (ref 0–200)
HDL: 65.3 mg/dL (ref >39.00)
LDL CALC: 129 mg/dL — AB (ref 0–99)
NONHDL: 139.9
Triglycerides: 53 mg/dL (ref 0.0–149.0)
VLDL: 10.6 mg/dL (ref 0.0–40.0)

## 2018-08-28 LAB — HEMOGLOBIN A1C: HEMOGLOBIN A1C: 6 % (ref 4.6–6.5)

## 2018-08-29 DIAGNOSIS — I10 Essential (primary) hypertension: Secondary | ICD-10-CM | POA: Insufficient documentation

## 2018-08-29 DIAGNOSIS — R739 Hyperglycemia, unspecified: Secondary | ICD-10-CM | POA: Insufficient documentation

## 2018-08-29 NOTE — Assessment & Plan Note (Signed)
Tolerating statin, encouraged heart healthy diet, avoid trans fats, minimize simple carbs and saturated fats. Increase exercise as tolerated 

## 2018-08-29 NOTE — Assessment & Plan Note (Signed)
Check labs 

## 2018-08-29 NOTE — Assessment & Plan Note (Signed)
Stable Refill pain meds  

## 2018-09-03 ENCOUNTER — Encounter: Payer: Self-pay | Admitting: Family Medicine

## 2018-09-03 NOTE — Telephone Encounter (Signed)
cbd can increase serum concentration of lipitor and simvastatin ----  We can recheck labs in 2 months if she starts taking the cbd oil  We may be able to decrease it

## 2018-09-09 DIAGNOSIS — Z6824 Body mass index (BMI) 24.0-24.9, adult: Secondary | ICD-10-CM | POA: Diagnosis not present

## 2018-09-09 DIAGNOSIS — M2550 Pain in unspecified joint: Secondary | ICD-10-CM | POA: Diagnosis not present

## 2018-09-12 ENCOUNTER — Telehealth: Payer: Self-pay | Admitting: *Deleted

## 2018-09-12 NOTE — Telephone Encounter (Signed)
Received Lab Report results from [facility]; forwarded to provider/SLS  11/21 

## 2018-09-30 ENCOUNTER — Other Ambulatory Visit: Payer: Self-pay | Admitting: Family Medicine

## 2018-09-30 DIAGNOSIS — G894 Chronic pain syndrome: Secondary | ICD-10-CM

## 2018-09-30 DIAGNOSIS — G8929 Other chronic pain: Secondary | ICD-10-CM

## 2018-09-30 NOTE — Telephone Encounter (Signed)
Refill request for hydrocodone.   Last OV; 08/27/2018 Last Fill: 08/27/2018 #60 and 0RF UDS: 05/09/2018

## 2018-10-01 MED ORDER — HYDROCODONE-ACETAMINOPHEN 10-325 MG PO TABS: 1.0000 | ORAL_TABLET | Freq: Three times a day (TID) | ORAL | 0 refills | Status: DC | PRN

## 2018-10-29 DIAGNOSIS — R69 Illness, unspecified: Secondary | ICD-10-CM | POA: Diagnosis not present

## 2018-11-05 ENCOUNTER — Ambulatory Visit (INDEPENDENT_AMBULATORY_CARE_PROVIDER_SITE_OTHER): Payer: Medicare HMO | Admitting: Family Medicine

## 2018-11-05 ENCOUNTER — Encounter: Payer: Self-pay | Admitting: Family Medicine

## 2018-11-05 VITALS — BP 112/82 | HR 98 | Temp 98.5°F | Resp 16 | Ht 66.0 in | Wt 152.6 lb

## 2018-11-05 DIAGNOSIS — G894 Chronic pain syndrome: Secondary | ICD-10-CM

## 2018-11-05 DIAGNOSIS — J111 Influenza due to unidentified influenza virus with other respiratory manifestations: Secondary | ICD-10-CM | POA: Diagnosis not present

## 2018-11-05 DIAGNOSIS — R52 Pain, unspecified: Secondary | ICD-10-CM | POA: Diagnosis not present

## 2018-11-05 DIAGNOSIS — G8929 Other chronic pain: Secondary | ICD-10-CM | POA: Diagnosis not present

## 2018-11-05 LAB — POC INFLUENZA A&B (BINAX/QUICKVUE)
INFLUENZA A, POC: POSITIVE — AB
INFLUENZA B, POC: NEGATIVE

## 2018-11-05 MED ORDER — HYDROCODONE-ACETAMINOPHEN 10-325 MG PO TABS: 1.0000 | ORAL_TABLET | Freq: Three times a day (TID) | ORAL | 0 refills | Status: DC | PRN

## 2018-11-05 NOTE — Assessment & Plan Note (Signed)
Advil/ tylenol Drink plenty of fluids Rest rto prn

## 2018-11-05 NOTE — Patient Instructions (Signed)

## 2018-11-05 NOTE — Progress Notes (Signed)
Patient ID: Emily Maddox, female    DOB: [birth year]  Age: 48 y.o. MRN: [id number]    Subjective:  Subjective  HPI Emily Maddox presents for  High fever, body aches, headaches and clear congestion.  Symptoms x 5 days .  Symptoms came on suddenly  Review of Systems  Constitutional: Positive for chills and fever. Negative for appetite change, diaphoresis, fatigue and unexpected weight change.  HENT: Positive for congestion and rhinorrhea. Negative for sinus pressure, sinus pain, sneezing and sore throat.   Eyes: Negative for pain, redness and visual disturbance.  Respiratory: Negative for cough, chest tightness, shortness of breath and wheezing.   Cardiovascular: Negative for chest pain, palpitations and leg swelling.  Endocrine: Negative for cold intolerance, heat intolerance, polydipsia, polyphagia and polyuria.  Genitourinary: Negative for difficulty urinating, dysuria and frequency.  Musculoskeletal: Positive for myalgias.  Neurological: Negative for dizziness, light-headedness, numbness and headaches.    History Past Medical History:  Diagnosis Date  . 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 Diabetes mellitus   . Hyperlipidemia   . Tachycardia     She has a past surgical history that includes Breast excisional biopsy (Left, 2003).   Her family history includes Breast cancer in her unknown relative; Breast cancer (age of onset: 54) in her mother; Cancer (age of onset: 43) in her father; Cancer (age of onset: 50) in her mother; Colon cancer in her unknown relative; Diabetes in her father, mother, and sister; Heart disease in her father; Hyperlipidemia in her father and mother; Hypertension in her father and mother.She reports that she has been smoking cigarettes. She has a 19.00 pack-year smoking history. She has never used smokeless tobacco. She reports that she does not drink alcohol or use drugs.  Current Outpatient Medications  on File Prior to Visit  Medication Sig Dispense Refill  . Blood Glucose Monitoring Suppl ([place]) w/Device KIT 1 each by Does not apply route daily. Dx code: E11.9 1 kit 0  . glucose blood (ONETOUCH VERIO) test strip Check sugar every day.  Dx Code: E11.9 100 each 12  . metoprolol succinate (TOPROL-XL) 25 MG 24 hr tablet Take 0.5 tablets (12.5 mg total) by mouth daily. 15 tablet 2  . ONETOUCH DELICA LANCETS 13K MISC Check sugar every day.  Dx Code: E11.9 100 each 1  . Probiotic Product (PROBIOTIC DAILY PO) Take 1 capsule by mouth daily.    . simvastatin (ZOCOR) 40 MG tablet Take 1 tablet (40 mg total) by mouth at bedtime. 30 tablet 2   No current facility-administered medications on file prior to visit.      Objective:  Objective  Physical Exam Vitals signs and nursing note reviewed.  Constitutional:      Appearance: She is well-developed.  HENT:     Right Ear: External ear normal.     Left Ear: External ear normal.  Eyes:     General:        Right eye: No discharge.        Left eye: No discharge.     Conjunctiva/sclera: Conjunctivae normal.  Cardiovascular:     Rate and Rhythm: Normal rate and regular rhythm.     Heart sounds: Normal heart sounds. No murmur.  Pulmonary:     Effort: Pulmonary effort is normal. No respiratory distress.     Breath sounds: Normal breath sounds. No wheezing or rales.  Chest:     Chest wall: No tenderness.  Lymphadenopathy:     Cervical: Cervical adenopathy present.  Neurological:     Mental Status: She is alert and oriented to person, place, and time.    BP 112/82 (BP Location: Left Arm, Patient Position: Sitting, Cuff Size: Normal)   Pulse 98   Temp 98.5 F (36.9 C) (Oral)   Resp 16   Ht '5\' 6"'$  (1.676 m)   Wt 152 lb 9.6 oz (69.2 kg)   SpO2 99%   BMI 24.63 kg/m  Wt Readings from Last 3 Encounters:  11/05/18 152 lb 9.6 oz (69.2 kg)  08/29/18 157 lb 3.2 oz (71.3 kg)  05/09/18 159 lb (72.1 kg)     Lab Results    Component Value Date   WBC 7.1 04/09/2018   HGB 13.1 04/09/2018   HCT 39.0 04/09/2018   PLT 268.0 04/09/2018   GLUCOSE 89 08/27/2018   CHOL 205 (H) 08/27/2018   TRIG 53.0 08/27/2018   HDL 65.30 08/27/2018   LDLDIRECT 143.7 11/12/2013   LDLCALC 129 (H) 08/27/2018   ALT 4 08/27/2018   AST 11 08/27/2018   NA 137 08/27/2018   K 4.7 08/27/2018   CL 105 08/27/2018   CREATININE 0.89 08/27/2018   BUN 13 08/27/2018   CO2 26 08/27/2018   TSH 0.95 04/09/2018   HGBA1C 6.0 08/27/2018   MICROALBUR <0.7 10/29/2017    No results found.   Assessment & Plan:  Plan  I am having Emily Maddox maintain her Probiotic Product (PROBIOTIC DAILY PO), ONETOUCH VERIO FLEX SYSTEM, glucose blood, ONETOUCH DELICA LANCETS 85B, metoprolol succinate, simvastatin, and HYDROcodone-acetaminophen.  Meds ordered this encounter  Medications  . HYDROcodone-acetaminophen (NORCO) 10-325 MG tablet    Sig: Take 1 tablet by mouth every 8 (eight) hours as needed.    Dispense:  60 tablet    Refill:  0    Do not fill until 02/16/2018    Problem List Items Addressed This Visit      Unprioritized   Chronic pain syndrome   Relevant Medications   HYDROcodone-acetaminophen (NORCO) 10-325 MG tablet   Influenza    Advil/ tylenol Drink plenty of fluids Rest rto prn       Other Visit Diagnoses    Body aches    -  Primary   Relevant Orders   POC Influenza A&B (Binax test) (Completed)   Other chronic pain       Relevant Medications   HYDROcodone-acetaminophen (NORCO) 10-325 MG tablet      Follow-up: Return if symptoms worsen or fail to improve.  [name], DO

## 2018-11-11 ENCOUNTER — Telehealth: Payer: Self-pay | Admitting: *Deleted

## 2018-11-11 NOTE — Telephone Encounter (Signed)
Copied from [place] [id number]. Topic: General - Inquiry >> Nov 11, 2018  9:09 AM Emily Maddox, NT wrote: Reason for CRM: patient is calling and states she was seen on 11/05/18 and was written out of work for the flu. Patient states she is still weak and states she is requesting Dr. Etter Sjogren to write her out of work until Monday 11/18/18. She states she is not sure if it is the flu or fibromyalgia. Please advise.

## 2018-11-12 NOTE — Telephone Encounter (Signed)
Patient scheduled for Thursday

## 2018-11-14 ENCOUNTER — Ambulatory Visit (INDEPENDENT_AMBULATORY_CARE_PROVIDER_SITE_OTHER): Payer: Medicare HMO | Admitting: Family Medicine

## 2018-11-14 ENCOUNTER — Encounter: Payer: Self-pay | Admitting: Family Medicine

## 2018-11-14 VITALS — BP 120/78 | HR 114 | Temp 98.1°F | Resp 16 | Ht 66.0 in | Wt 148.8 lb

## 2018-11-14 DIAGNOSIS — D649 Anemia, unspecified: Secondary | ICD-10-CM | POA: Diagnosis not present

## 2018-11-14 DIAGNOSIS — E538 Deficiency of other specified B group vitamins: Secondary | ICD-10-CM | POA: Diagnosis not present

## 2018-11-14 DIAGNOSIS — M797 Fibromyalgia: Secondary | ICD-10-CM

## 2018-11-14 NOTE — Progress Notes (Signed)
Patient ID: Emily Maddox, female    DOB: 1971/05/28  Age: 48 y.o. MRN: [id number]    Subjective:  Subjective  HPI TINY CHAUDHARY presents for extreme fatigue and fibromyalgia pain that was exacerbated by flu symptoms   Review of Systems  Constitutional: Positive for fatigue. Negative for appetite change, diaphoresis and unexpected weight change.  Eyes: Negative for pain, redness and visual disturbance.  Respiratory: Negative for cough, chest tightness, shortness of breath and wheezing.   Cardiovascular: Negative for chest pain, palpitations and leg swelling.  Endocrine: Negative for cold intolerance, heat intolerance, polydipsia, polyphagia and polyuria.  Genitourinary: Negative for difficulty urinating, dysuria and frequency.  Neurological: Negative for dizziness, light-headedness, numbness and headaches.    History Past Medical History:  Diagnosis Date  . 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 Diabetes mellitus   . Hyperlipidemia   . Tachycardia     She has a past surgical history that includes Breast excisional biopsy (Left, 2003).   Her family history includes Breast cancer in her unknown relative; Breast cancer (age of onset: 79) in her mother; Cancer (age of onset: 9) in her father; Cancer (age of onset: 55) in her mother; Colon cancer in her unknown relative; Diabetes in her father, mother, and sister; Heart disease in her father; Hyperlipidemia in her father and mother; Hypertension in her father and mother.She reports that she has been smoking cigarettes. She has a 19.00 pack-year smoking history. She has never used smokeless tobacco. She reports that she does not drink alcohol or use drugs.  Current Outpatient Medications on File Prior to Visit  Medication Sig Dispense Refill  . Blood Glucose Monitoring Suppl ([place]) w/Device KIT 1 each by Does not apply route daily. Dx code: E11.9 1 kit 0  .  glucose blood (ONETOUCH VERIO) test strip Check sugar every day.  Dx Code: E11.9 100 each 12  . HYDROcodone-acetaminophen (NORCO) 10-325 MG tablet Take 1 tablet by mouth every 8 (eight) hours as needed. 60 tablet 0  . metoprolol succinate (TOPROL-XL) 25 MG 24 hr tablet Take 0.5 tablets (12.5 mg total) by mouth daily. 15 tablet 2  . ONETOUCH DELICA LANCETS 44R MISC Check sugar every day.  Dx Code: E11.9 100 each 1  . Probiotic Product (PROBIOTIC DAILY PO) Take 1 capsule by mouth daily.    . simvastatin (ZOCOR) 40 MG tablet Take 1 tablet (40 mg total) by mouth at bedtime. 30 tablet 2   No current facility-administered medications on file prior to visit.      Objective:  Objective  Physical Exam Vitals signs and nursing note reviewed.  Constitutional:      Appearance: She is well-developed.  HENT:     Head: Normocephalic and atraumatic.  Eyes:     Conjunctiva/sclera: Conjunctivae normal.  Neck:     Musculoskeletal: Normal range of motion and neck supple.     Thyroid: No thyromegaly.     Vascular: No carotid bruit or JVD.  Cardiovascular:     Rate and Rhythm: Normal rate and regular rhythm.     Heart sounds: Normal heart sounds. No murmur.  Pulmonary:     Effort: Pulmonary effort is normal. No respiratory distress.     Breath sounds: Normal breath sounds. No wheezing or rales.  Chest:     Chest wall: No tenderness.  Neurological:     Mental Status: She is alert and oriented to person, place, and time.  BP 120/78 (BP Location: Right Arm, Cuff Size: Normal)   Pulse (!) 114   Temp 98.1 F (36.7 C) (Oral)   Resp 16   Ht '5\' 6"'$  (1.676 m)   Wt 148 lb 12.8 oz (67.5 kg)   LMP 11/13/2018   SpO2 98%   BMI 24.02 kg/m  Wt Readings from Last 3 Encounters:  11/14/18 148 lb 12.8 oz (67.5 kg)  11/05/18 152 lb 9.6 oz (69.2 kg)  08/29/18 157 lb 3.2 oz (71.3 kg)     Lab Results  Component Value Date   WBC 7.1 04/09/2018   HGB 13.1 04/09/2018   HCT 39.0 04/09/2018   PLT 268.0  04/09/2018   GLUCOSE 89 08/27/2018   CHOL 205 (H) 08/27/2018   TRIG 53.0 08/27/2018   HDL 65.30 08/27/2018   LDLDIRECT 143.7 11/12/2013   LDLCALC 129 (H) 08/27/2018   ALT 4 08/27/2018   AST 11 08/27/2018   NA 137 08/27/2018   K 4.7 08/27/2018   CL 105 08/27/2018   CREATININE 0.89 08/27/2018   BUN 13 08/27/2018   CO2 26 08/27/2018   TSH 0.95 04/09/2018   HGBA1C 6.0 08/27/2018   MICROALBUR <0.7 10/29/2017    No results found.   Assessment & Plan:  Plan  I am having Emily Maddox maintain her Probiotic Product (PROBIOTIC DAILY PO), ONETOUCH VERIO FLEX SYSTEM, glucose blood, ONETOUCH DELICA LANCETS 40H, metoprolol succinate, simvastatin, and HYDROcodone-acetaminophen.  No orders of the defined types were placed in this encounter.   Problem List Items Addressed This Visit      Unprioritized   B12 deficiency - Primary    Check labs       Relevant Orders   Vitamin B12   CBC with Differential/Platelet   Fibromyalgia    With fatigue Check labs  Take mvi        Other Visit Diagnoses    Anemia, unspecified type       Relevant Orders   Vitamin B12   CBC with Differential/Platelet    flu symptoms have resolved   Follow-up: Return if symptoms worsen or fail to improve.  [name], DO

## 2018-11-14 NOTE — Assessment & Plan Note (Signed)
Check labs 

## 2018-11-14 NOTE — Assessment & Plan Note (Signed)
With fatigue Check labs  Take mvi

## 2018-11-15 LAB — CBC WITH DIFFERENTIAL/PLATELET
BASOS PCT: 0.8 % (ref 0.0–3.0)
Basophils Absolute: 0.1 10*3/uL (ref 0.0–0.1)
EOS PCT: 0.4 % (ref 0.0–5.0)
Eosinophils Absolute: 0 10*3/uL (ref 0.0–0.7)
HCT: 42.5 % (ref 36.0–46.0)
Hemoglobin: 14.1 g/dL (ref 12.0–15.0)
LYMPHS ABS: 2.9 10*3/uL (ref 0.7–4.0)
Lymphocytes Relative: 25 % (ref 12.0–46.0)
MCHC: 33.2 g/dL (ref 30.0–36.0)
MCV: 98.8 fl (ref 78.0–100.0)
MONO ABS: 0.4 10*3/uL (ref 0.1–1.0)
MONOS PCT: 3.5 % (ref 3.0–12.0)
NEUTROS ABS: 8.1 10*3/uL — AB (ref 1.4–7.7)
NEUTROS PCT: 70.3 % (ref 43.0–77.0)
PLATELETS: 315 10*3/uL (ref 150.0–400.0)
RBC: 4.3 Mil/uL (ref 3.87–5.11)
RDW: 13 % (ref 11.5–15.5)
WBC: 11.5 10*3/uL — ABNORMAL HIGH (ref 4.0–10.5)

## 2018-11-15 LAB — VITAMIN B12: Vitamin B-12: 297 pg/mL (ref 211–911)

## 2018-11-21 ENCOUNTER — Other Ambulatory Visit: Payer: Self-pay | Admitting: Family Medicine

## 2018-11-21 ENCOUNTER — Encounter: Payer: Self-pay | Admitting: Family Medicine

## 2018-11-21 DIAGNOSIS — D72829 Elevated white blood cell count, unspecified: Secondary | ICD-10-CM

## 2018-11-21 NOTE — Telephone Encounter (Signed)
Repeat 3 months   cbcd

## 2018-11-29 ENCOUNTER — Telehealth: Payer: Self-pay | Admitting: *Deleted

## 2018-11-29 DIAGNOSIS — Z79899 Other long term (current) drug therapy: Secondary | ICD-10-CM

## 2018-11-29 DIAGNOSIS — G894 Chronic pain syndrome: Secondary | ICD-10-CM

## 2018-11-29 NOTE — Addendum Note (Signed)
Addended by: Kem Boroughs D on: 11/29/2018 02:36 PM   Modules accepted: Orders

## 2018-11-29 NOTE — Telephone Encounter (Signed)
Copied from [place] [id number]. Topic: General - Other >> Nov 29, 2018  9:11 AM Emily Maddox wrote: Reason for CRM: pt called in to schedule a lab and B-12 (nurse) visit. Pt says that she has to have her white blood cells rechecked because her count was low at her last visit. ALSO, pt says that she is due her B-12. Pt would like to have the orders placed for B-12 and also lab. Pt would also like to know if she is due to have her UDS drug screening?

## 2018-11-29 NOTE — Telephone Encounter (Signed)
Patient notified that orders are in 

## 2018-12-03 ENCOUNTER — Ambulatory Visit: Payer: Medicare HMO | Admitting: Family Medicine

## 2018-12-03 ENCOUNTER — Other Ambulatory Visit (INDEPENDENT_AMBULATORY_CARE_PROVIDER_SITE_OTHER): Payer: Medicare HMO

## 2018-12-03 ENCOUNTER — Ambulatory Visit (INDEPENDENT_AMBULATORY_CARE_PROVIDER_SITE_OTHER): Payer: Medicare HMO

## 2018-12-03 DIAGNOSIS — E538 Deficiency of other specified B group vitamins: Secondary | ICD-10-CM

## 2018-12-03 DIAGNOSIS — Z79899 Other long term (current) drug therapy: Secondary | ICD-10-CM

## 2018-12-03 DIAGNOSIS — D72829 Elevated white blood cell count, unspecified: Secondary | ICD-10-CM

## 2018-12-03 DIAGNOSIS — G894 Chronic pain syndrome: Secondary | ICD-10-CM

## 2018-12-03 LAB — CBC WITH DIFFERENTIAL/PLATELET
Basophils Absolute: 0.1 10*3/uL (ref 0.0–0.1)
Basophils Relative: 0.9 % (ref 0.0–3.0)
Eosinophils Absolute: 0.1 10*3/uL (ref 0.0–0.7)
Eosinophils Relative: 1.4 % (ref 0.0–5.0)
HCT: 38.4 % (ref 36.0–46.0)
Hemoglobin: 12.9 g/dL (ref 12.0–15.0)
Lymphocytes Relative: 33.4 % (ref 12.0–46.0)
Lymphs Abs: 2.7 10*3/uL (ref 0.7–4.0)
MCHC: 33.7 g/dL (ref 30.0–36.0)
MCV: 98 fl (ref 78.0–100.0)
Monocytes Absolute: 0.4 10*3/uL (ref 0.1–1.0)
Monocytes Relative: 5.5 % (ref 3.0–12.0)
Neutro Abs: 4.8 10*3/uL (ref 1.4–7.7)
Neutrophils Relative %: 58.8 % (ref 43.0–77.0)
Platelets: 222 10*3/uL (ref 150.0–400.0)
RBC: 3.92 Mil/uL (ref 3.87–5.11)
RDW: 13.7 % (ref 11.5–15.5)
WBC: 8.1 10*3/uL (ref 4.0–10.5)

## 2018-12-03 MED ORDER — CYANOCOBALAMIN 1000 MCG/ML IJ SOLN
1000.0000 ug | Freq: Once | INTRAMUSCULAR | Status: AC
  Administered 2018-12-03: 1000 ug via INTRAMUSCULAR

## 2018-12-03 NOTE — Progress Notes (Addendum)
Patient came in today to have her monthly b-12 injection. She tolerated 32ml in her left deltoid with no complications   NOTED  [name], DO

## 2018-12-06 ENCOUNTER — Other Ambulatory Visit: Payer: Self-pay | Admitting: Family Medicine

## 2018-12-06 DIAGNOSIS — G8929 Other chronic pain: Secondary | ICD-10-CM

## 2018-12-06 DIAGNOSIS — G894 Chronic pain syndrome: Secondary | ICD-10-CM

## 2018-12-06 LAB — PAIN MGMT, PROFILE 8 W/CONF, U
6 Acetylmorphine: NEGATIVE ng/mL (ref <10)
AMPHETAMINES: NEGATIVE ng/mL (ref <500)
Alcohol Metabolites: NEGATIVE ng/mL (ref <500)
Benzodiazepines: NEGATIVE ng/mL (ref <100)
Buprenorphine, Urine: NEGATIVE ng/mL (ref <5)
CREATININE: 71.6 mg/dL
Cocaine Metabolite: NEGATIVE ng/mL (ref <150)
Codeine: NEGATIVE ng/mL (ref <50)
Hydrocodone: 234 ng/mL — ABNORMAL HIGH (ref <50)
Hydromorphone: 117 ng/mL — ABNORMAL HIGH (ref <50)
MDMA: NEGATIVE ng/mL (ref <500)
Marijuana Metabolite: NEGATIVE ng/mL (ref <20)
Morphine: NEGATIVE ng/mL (ref <50)
Norhydrocodone: 697 ng/mL — ABNORMAL HIGH (ref <50)
Opiates: POSITIVE ng/mL — AB (ref <100)
Oxidant: NEGATIVE ug/mL (ref <200)
Oxycodone: NEGATIVE ng/mL (ref <100)
pH: 6.8 (ref 4.5–9.0)

## 2018-12-06 MED ORDER — HYDROCODONE-ACETAMINOPHEN 10-325 MG PO TABS: 1.0000 | ORAL_TABLET | Freq: Three times a day (TID) | ORAL | 0 refills | Status: DC | PRN

## 2018-12-06 NOTE — Telephone Encounter (Signed)
Copied from [place] [id number]. Topic: Quick Communication - Rx Refill/Question >> Dec 06, 2018 10:41 AM Judyann Munson wrote: Medication: HYDROcodone-acetaminophen (NORCO) 10-325 MG tablet  Has the patient contacted their pharmacy? Yes   Preferred Pharmacy (with phone number or street name): [place], [place] [address] [phone] (Phone) [phone] (Fax)    Agent: Please be advised that RX refills may take up to 3 business days. We ask that you follow-up with your pharmacy.

## 2018-12-06 NOTE — Telephone Encounter (Signed)
Requested medication (s) are due for refill today: yes  Requested medication (s) are on the active medication list: yes  Last refill:  11/05/18 #60  Future visit scheduled: no  Notes to clinic:  Medication not delegated to NT to refill   Requested Prescriptions  Pending Prescriptions Disp Refills   HYDROcodone-acetaminophen (NORCO) 10-325 MG tablet 60 tablet 0    Sig: Take 1 tablet by mouth every 8 (eight) hours as needed.     Not Delegated - Analgesics:  Opioid Agonist Combinations Failed - 12/06/2018 10:43 AM      Failed - This refill cannot be delegated      Passed - Urine Drug Screen completed in last 360 days.      Passed - Valid encounter within last 6 months    Recent Outpatient Visits          3 weeks ago B12 deficiency   [facility], DO   1 month ago Body aches   [facility], DO   3 months ago Hyperglycemia   [facility], DO   7 months ago [facility], DO   8 months ago [facility] [place], [place], [place]

## 2018-12-10 ENCOUNTER — Encounter: Payer: Self-pay | Admitting: *Deleted

## 2018-12-11 DIAGNOSIS — R69 Illness, unspecified: Secondary | ICD-10-CM | POA: Diagnosis not present

## 2019-01-07 ENCOUNTER — Other Ambulatory Visit: Payer: Self-pay | Admitting: Family Medicine

## 2019-01-07 DIAGNOSIS — G894 Chronic pain syndrome: Secondary | ICD-10-CM

## 2019-01-07 DIAGNOSIS — G8929 Other chronic pain: Secondary | ICD-10-CM

## 2019-01-07 MED ORDER — HYDROCODONE-ACETAMINOPHEN 10-325 MG PO TABS: 1.0000 | ORAL_TABLET | Freq: Three times a day (TID) | ORAL | 0 refills | Status: DC | PRN

## 2019-01-07 NOTE — Telephone Encounter (Signed)
Copied from [place] [id number]. Topic: Quick Communication - Rx Refill/Question >> Jan 07, 2019  7:39 AM Rayann Heman wrote: Medication:HYDROcodone-acetaminophen [facility]) 10-325 MG tablet [[id number]]   Has the patient contacted their pharmacy? no Preferred Pharmacy (with phone number or street name):[place], [address] [phone] (Phone) [phone] (Fax)   Agent: Please be advised that RX refills may take up to 3 business days. We ask that you follow-up with your pharmacy.

## 2019-01-07 NOTE — Telephone Encounter (Signed)
Pt is requesting refill on hydrocodone.   Last OV: 11/14/2018 Last Fill: 12/06/2018 #60 and 0RF UDS: 12/03/2018 Low risk

## 2019-01-09 ENCOUNTER — Other Ambulatory Visit: Payer: Self-pay | Admitting: Family Medicine

## 2019-01-09 ENCOUNTER — Telehealth: Payer: Self-pay | Admitting: *Deleted

## 2019-01-09 DIAGNOSIS — E1149 Type 2 diabetes mellitus with other diabetic neurological complication: Secondary | ICD-10-CM

## 2019-01-09 NOTE — Telephone Encounter (Signed)
Copied from [place] [id number]. 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 She states she just recovered from the flu. >> Jan 09, 2019  9:17 AM [place], Emily Maddox wrote: Pt states she would like to add that she has had a headache x 2 days and has been experiencing fatigue as well.

## 2019-01-12 NOTE — Telephone Encounter (Signed)
Due to dm and fibro she is at high risk for covid 19

## 2019-01-13 ENCOUNTER — Other Ambulatory Visit: Payer: Self-pay | Admitting: Family Medicine

## 2019-01-13 ENCOUNTER — Telehealth: Payer: Self-pay | Admitting: *Deleted

## 2019-01-13 DIAGNOSIS — G894 Chronic pain syndrome: Secondary | ICD-10-CM

## 2019-01-13 DIAGNOSIS — G8929 Other chronic pain: Secondary | ICD-10-CM

## 2019-01-13 MED ORDER — HYDROCODONE-ACETAMINOPHEN 10-325 MG PO TABS: 1.0000 | ORAL_TABLET | Freq: Three times a day (TID) | ORAL | 0 refills | Status: DC | PRN

## 2019-01-13 NOTE — Telephone Encounter (Signed)
We can "see her" or do video visit-- is she able to check her bp?

## 2019-01-13 NOTE — Telephone Encounter (Signed)
Note written and sent to patient.  She just wanted to make [place] aware of headache, did not need to be seen.

## 2019-01-13 NOTE — Telephone Encounter (Signed)
Can you advise on the second part of the note about headache?

## 2019-01-13 NOTE — Telephone Encounter (Signed)
Pharmacy said they could not fill rx because of note to pharmacy (it says do not fill til 02/16/18).  Can you send in a new script please?

## 2019-01-14 ENCOUNTER — Encounter: Payer: Self-pay | Admitting: *Deleted

## 2019-01-27 NOTE — Progress Notes (Signed)
I connected with [name] on 01/28/19 at  3:00 PM EDT by a video enabled telemedicine application and verified that I am speaking with the correct person using two identifiers.  Subjective:   Emily Maddox is a 48 y.o. female who presents for Medicare Annual (Subsequent) preventive examination.  Review of Systems: No ROS.  Medicare Wellness Visit. Additional risk factors are reflected in the social history.  Sleep patterns: varies. States ins company denied sleep study.  Home Safety/Smoke Alarms: Feels safe in home. Smoke alarms in place.  Lives alone.    Female:   Pap-  2015    Mammo-  04/18/18 CCS- 01/03/16. Recall 5 yrs    Objective:     Vitals:   Unable to assess. This visit is enabled though telemedicine due to Covid 19.   Advanced Directives 01/28/2019 03/06/2017 11/11/2015 10/11/2015  Does Patient Have a Medical Advance Directive? Yes No No No  Type of Paramedic of [place];Living will - - -  Does patient want to make changes to medical advance directive? No - Patient declined - - -  Copy of [place] in Chart? Yes - validated most recent copy scanned in chart (See row information) - - -  Would patient like information on creating a medical advance directive? - Yes (MAU/Ambulatory/Procedural Areas - Information given) No - patient declined information No - patient declined information    Tobacco Social History   Tobacco Use  Smoking Status Current Every Day Smoker  . Packs/day: 1.00  . Years: 19.00  . Pack years: 19.00  . Types: Cigarettes  Smokeless Tobacco Never Used  Tobacco Comment   couldn't tolerate chantix, cut down to 0.5 pack since 11/2013     Ready to quit: No Counseling given: No Comment: couldn't tolerate chantix, cut down to 0.5 pack since 11/2013   Clinical Intake: Pain : No/denies pain   Past Medical History:  Diagnosis Date  . 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 Diabetes mellitus   . Hyperlipidemia   . Tachycardia    Past Surgical History:  Procedure Laterality Date  . BREAST EXCISIONAL BIOPSY Left 2003   Family History  Problem Relation Age of Onset  . Cancer Mother 4       breast  . Hypertension Mother   . Hyperlipidemia Mother   . Diabetes Mother   . Breast cancer Mother 50  . Cancer Father 68       colon  . Hypertension Father   . Hyperlipidemia Father   . Heart disease Father        a fib  . Diabetes Father   . Diabetes Sister   . Breast cancer Other   . Colon cancer Other    Social History   Socioeconomic History  . Marital status: Single    Spouse name: Not on file  . Number of children: Not on file  . Years of education: Not on file  . Highest education level: Not on file  Occupational History    Employer: UNEMPLOYED  Social Needs  . Financial resource strain: Not on file  . Food insecurity:    Worry: Not on file    Inability: Not on file  . Transportation needs:    Medical: Not on file    Non-medical: Not on file  Tobacco Use  . Smoking status: Current Every Day Smoker    Packs/day: 1.00    Years: 19.00  Pack years: 19.00    Types: Cigarettes  . Smokeless tobacco: Never Used  . Tobacco comment: couldn't tolerate chantix, cut down to 0.5 pack since 11/2013  Substance and Sexual Activity  . Alcohol use: No  . Drug use: No  . Sexual activity: Yes    Partners: Female  Lifestyle  . Physical activity:    Days per week: Not on file    Minutes per session: Not on file  . Stress: Not on file  Relationships  . Social connections:    Talks on phone: Not on file    Gets together: Not on file    Attends religious service: Not on file    Active member of club or organization: Not on file    Attends meetings of clubs or organizations: Not on file    Relationship status: Not on file  Other Topics Concern  . Not on file  Social History Narrative   Lives alone.     Highest level of  education:  B.S. in business administration   Exercise--  3-4 x a week    Outpatient Encounter Medications as of 01/28/2019  Medication Sig  . Blood Glucose Monitoring Suppl ([place]) w/Device KIT 1 each by Does not apply route daily. Dx code: E11.9  . HYDROcodone-acetaminophen (NORCO) 10-325 MG tablet Take 1 tablet by mouth every 8 (eight) hours as needed.  Glory Rosebush DELICA LANCETS 11X MISC Check sugar every day.  Dx Code: E11.9  . ONETOUCH VERIO test strip USE 1 STRIP TO CHECK GLUCOSE ONCE DAILY  . metoprolol succinate (TOPROL-XL) 25 MG 24 hr tablet Take 0.5 tablets (12.5 mg total) by mouth daily. (Patient not taking: Reported on 01/28/2019)  . Probiotic Product (PROBIOTIC DAILY PO) Take 1 capsule by mouth daily.  . simvastatin (ZOCOR) 40 MG tablet Take 1 tablet (40 mg total) by mouth at bedtime. (Patient not taking: Reported on 01/28/2019)   No facility-administered encounter medications on file as of 01/28/2019.     Activities of Daily Living In your present state of health, do you have any difficulty performing the following activities: 01/28/2019  Hearing? N  Vision? N  Difficulty concentrating or making decisions? N  Walking or climbing stairs? N  Dressing or bathing? N  Doing errands, shopping? N  Preparing Food and eating ? N  Using the Toilet? N  In the past six months, have you accidently leaked urine? Y  Do you have problems with loss of bowel control? N  Managing your Medications? N  Managing your Finances? N  Housekeeping or managing your Housekeeping? N  Some recent data might be hidden    Patient Care Team: Carollee Herter, Alferd Apa, DO as PCP - General Juanita Craver, MD as Consulting Physician (Gastroenterology) Linda Hedges, DO as Consulting Physician (Obstetrics and Gynecology)    Assessment:   This is a routine wellness examination for [name]. Physical assessment deferred to PCP.  Exercise Activities and Dietary recommendations Current Exercise  Habits: Home exercise routine, Type of exercise: stretching(recumbent bike), Time (Minutes): 35, Frequency (Times/Week): 4, Weekly Exercise (Minutes/Week): 140, Intensity: Mild, Exercise limited by: None identified   Diet (meal preparation, eat out, water intake, caffeinated beverages, dairy products, fruits and vegetables): well balanced   Goals    . Increase physical activity    . Quit smoking / using tobacco       Fall Risk Fall Risk  01/28/2019 03/06/2017 11/11/2015  Falls in the past year? 0 Yes Yes  Number falls  in past yr: - 2 or more 2 or more  Injury with Fall? - No No  Risk Factor Category  - - High Fall Risk  Risk for fall due to : - Impaired balance/gait Impaired balance/gait;History of fall(s)  Risk for fall due to: Comment - - occasional leg weakness and numbness  Follow up - - Education provided;Falls prevention discussed   Depression Screen PHQ 2/9 Scores 01/28/2019 11/14/2018 03/06/2017 08/29/2016  PHQ - 2 Score 0 0 0 0  PHQ- 9 Score - 4 - -     Cognitive Function Ad8 score reviewed for issues:  Issues making decisions:no  Less interest in hobbies / activities:no  Repeats questions, stories (family complaining): no  Trouble using ordinary gadgets (microwave, computer, phone):no  Forgets the month or year: no  Mismanaging finances: no  Remembering appts:no  Daily problems with thinking and/or memory:no Ad8 score is=0         Immunization History  Administered Date(s) Administered  . Hep A / Hep B 02/03/2009, 04/05/2009, 09/03/2009, 10/31/2010  . PPD Test 08/08/2017  . Td 02/03/2009     Screening Tests Health Maintenance  Topic Date Due  . PNEUMOCOCCAL POLYSACCHARIDE VACCINE AGE 24-64 HIGH RISK  08/21/1974  . PAP SMEAR-Modifier  07/27/2017  . OPHTHALMOLOGY EXAM  11/23/2017  . FOOT EXAM  10/29/2018  . URINE MICROALBUMIN  10/29/2018  . TETANUS/TDAP  02/04/2019  . HEMOGLOBIN A1C  02/25/2019  . MAMMOGRAM  04/19/2019  . INFLUENZA VACCINE   05/24/2019  . COLONOSCOPY  01/02/2021  . HIV Screening  Completed       Plan:    Please schedule your next medicare wellness visit with me in 1 yr.  Continue to eat heart healthy diet (full of fruits, vegetables, whole grains, lean protein, water--limit salt, fat, and sugar intake) and increase physical activity as tolerated.  Continue doing brain stimulating activities (puzzles, reading, adult coloring books, staying active) to keep memory sharp.     I have personally reviewed and noted the following in the patient's chart:   . Medical and social history . Use of alcohol, tobacco or illicit drugs  . Current medications and supplements . Functional ability and status . Nutritional status . Physical activity . Advanced directives . List of other physicians . Hospitalizations, surgeries, and ER visits in previous 12 months . Vitals . Screenings to include cognitive, depression, and falls . Referrals and appointments  In addition, I have reviewed and discussed with patient certain preventive protocols, quality metrics, and best practice recommendations. A written personalized care plan for preventive services as well as general preventive health recommendations were provided to patient.     Naaman Plummer [place], [place]  01/28/2019

## 2019-01-28 ENCOUNTER — Other Ambulatory Visit: Payer: Self-pay | Admitting: *Deleted

## 2019-01-28 ENCOUNTER — Encounter: Payer: Self-pay | Admitting: *Deleted

## 2019-01-28 ENCOUNTER — Ambulatory Visit (INDEPENDENT_AMBULATORY_CARE_PROVIDER_SITE_OTHER): Payer: Medicare HMO | Admitting: *Deleted

## 2019-01-28 ENCOUNTER — Other Ambulatory Visit: Payer: Self-pay

## 2019-01-28 DIAGNOSIS — Z0000 Encounter for general adult medical examination without abnormal findings: Secondary | ICD-10-CM

## 2019-01-28 DIAGNOSIS — E1149 Type 2 diabetes mellitus with other diabetic neurological complication: Secondary | ICD-10-CM

## 2019-01-28 DIAGNOSIS — R69 Illness, unspecified: Secondary | ICD-10-CM | POA: Diagnosis not present

## 2019-01-28 MED ORDER — GLUCOSE BLOOD VI STRP: ORAL_STRIP | 1 refills | Status: DC

## 2019-01-28 NOTE — Patient Instructions (Signed)
Please schedule your next medicare wellness visit with me in 1 yr.  Continue to eat heart healthy diet (full of fruits, vegetables, whole grains, lean protein, water--limit salt, fat, and sugar intake) and increase physical activity as tolerated.  Continue doing brain stimulating activities (puzzles, reading, adult coloring books, staying active) to keep memory sharp.    Emily Maddox , Thank you for taking time to come for your Medicare Wellness Visit. I appreciate your ongoing commitment to your health goals. Please review the following plan we discussed and let me know if I can assist you in the future.   These are the goals we discussed: Goals    . Increase physical activity    . Quit smoking / using tobacco       This is a list of the screening recommended for you and due dates:  Health Maintenance  Topic Date Due  . Pneumococcal vaccine  08/21/1974  . Pap Smear  07/27/2017  . Eye exam for diabetics  11/23/2017  . Complete foot exam   10/29/2018  . Urine Protein Check  10/29/2018  . Tetanus Vaccine  02/04/2019  . Hemoglobin A1C  02/25/2019  . Mammogram  04/19/2019  . Flu Shot  05/24/2019  . Colon Cancer Screening  01/02/2021  . HIV Screening  Completed    Health Maintenance After Age 50 After age 50, you are at a higher risk for certain long-term diseases and infections as well as injuries from falls. Falls are a major cause of broken bones and head injuries in people who are older than age 47. Getting regular preventive care can help to keep you healthy and well. Preventive care includes getting regular testing and making lifestyle changes as recommended by your health care provider. Talk with your health care provider about:  Which screenings and tests you should have. A screening is a test that checks for a disease when you have no symptoms.  A diet and exercise plan that is right for you. What should I know about screenings and tests to prevent falls? Screening and  testing are the best ways to find a health problem early. Early diagnosis and treatment give you the best chance of managing medical conditions that are common after age 14. Certain conditions and lifestyle choices may make you more likely to have a fall. Your health care provider may recommend:  Regular vision checks. Poor vision and conditions such as cataracts can make you more likely to have a fall. If you wear glasses, make sure to get your prescription updated if your vision changes.  Medicine review. Work with your health care provider to regularly review all of the medicines you are taking, including over-the-counter medicines. Ask your health care provider about any side effects that may make you more likely to have a fall. Tell your health care provider if any medicines that you take make you feel dizzy or sleepy.  Osteoporosis screening. Osteoporosis is a condition that causes the bones to get weaker. This can make the bones weak and cause them to break more easily.  Blood pressure screening. Blood pressure changes and medicines to control blood pressure can make you feel dizzy.  Strength and balance checks. Your health care provider may recommend certain tests to check your strength and balance while standing, walking, or changing positions.  Foot health exam. Foot pain and numbness, as well as not wearing proper footwear, can make you more likely to have a fall.  Depression screening. You may be more  likely to have a fall if you have a fear of falling, feel emotionally low, or feel unable to do activities that you used to do.  Alcohol use screening. Using too much alcohol can affect your balance and may make you more likely to have a fall. What actions can I take to lower my risk of falls? General instructions  Talk with your health care provider about your risks for falling. Tell your health care provider if: ? You fall. Be sure to tell your health care provider about all falls,  even ones that seem minor. ? You feel dizzy, sleepy, or off-balance.  Take over-the-counter and prescription medicines only as told by your health care provider. These include any supplements.  Eat a healthy diet and maintain a healthy weight. A healthy diet includes low-fat dairy products, low-fat (lean) meats, and fiber from whole grains, beans, and lots of fruits and vegetables. Home safety  Remove any tripping hazards, such as rugs, cords, and clutter.  Install safety equipment such as grab bars in bathrooms and safety rails on stairs.  Keep rooms and walkways well-lit. Activity   Follow a regular exercise program to stay fit. This will help you maintain your balance. Ask your health care provider what types of exercise are appropriate for you.  If you need a cane or walker, use it as recommended by your health care provider.  Wear supportive shoes that have nonskid soles. Lifestyle  Do not drink alcohol if your health care provider tells you not to drink.  If you drink alcohol, limit how much you have: ? 0-1 drink a day for women. ? 0-2 drinks a day for men.  Be aware of how much alcohol is in your drink. In the [place] one drink equals one typical bottle of beer (12 oz), one-half glass of wine (5 oz), or one shot of hard liquor (1 oz).  Do not use any products that contain nicotine or tobacco, such as cigarettes and e-cigarettes. If you need help quitting, ask your health care provider. Summary  Having a healthy lifestyle and getting preventive care can help to protect your health and wellness after age 39.  Screening and testing are the best way to find a health problem early and help you avoid having a fall. Early diagnosis and treatment give you the best chance for managing medical conditions that are more common for people who are older than age 59.  Falls are a major cause of broken bones and head injuries in people who are older than age 3. Take precautions to  prevent a fall at home.  Work with your health care provider to learn what changes you can make to improve your health and wellness and to prevent falls. This information is not intended to replace advice given to you by your health care provider. Make sure you discuss any questions you have with your health care provider. Document Released: 08/22/2017 Document Revised: 08/22/2017 Document Reviewed: 08/22/2017 Elsevier Interactive Patient Education  2019 [employer].

## 2019-01-29 NOTE — Progress Notes (Signed)
Reviewed  Yvonne R Lowne Chase, DO  

## 2019-01-30 ENCOUNTER — Encounter: Payer: Self-pay | Admitting: *Deleted

## 2019-02-03 DIAGNOSIS — R69 Illness, unspecified: Secondary | ICD-10-CM | POA: Diagnosis not present

## 2019-02-04 ENCOUNTER — Encounter: Payer: Self-pay | Admitting: Family Medicine

## 2019-02-04 ENCOUNTER — Ambulatory Visit (INDEPENDENT_AMBULATORY_CARE_PROVIDER_SITE_OTHER): Payer: Medicare HMO | Admitting: Family Medicine

## 2019-02-04 ENCOUNTER — Telehealth: Payer: Self-pay

## 2019-02-04 ENCOUNTER — Telehealth: Payer: Self-pay | Admitting: Family Medicine

## 2019-02-04 ENCOUNTER — Other Ambulatory Visit: Payer: Self-pay

## 2019-02-04 DIAGNOSIS — E1149 Type 2 diabetes mellitus with other diabetic neurological complication: Secondary | ICD-10-CM | POA: Diagnosis not present

## 2019-02-04 DIAGNOSIS — E1169 Type 2 diabetes mellitus with other specified complication: Secondary | ICD-10-CM | POA: Diagnosis not present

## 2019-02-04 DIAGNOSIS — I10 Essential (primary) hypertension: Secondary | ICD-10-CM

## 2019-02-04 DIAGNOSIS — N393 Stress incontinence (female) (male): Secondary | ICD-10-CM

## 2019-02-04 DIAGNOSIS — E1165 Type 2 diabetes mellitus with hyperglycemia: Secondary | ICD-10-CM

## 2019-02-04 DIAGNOSIS — E785 Hyperlipidemia, unspecified: Secondary | ICD-10-CM

## 2019-02-04 NOTE — Telephone Encounter (Signed)
Copied from [place] [id number]. Topic: General - Other >> Feb 03, 2019  4:18 PM Leward Quan A wrote: Reason for CRM: Patient called to say that Dr Cheri Rous want her to schedule an appointment and she is waiting for a call back on 02/04/2019 Ph# [phone]

## 2019-02-04 NOTE — Progress Notes (Signed)
Virtual Visit via Video Note  I connected with Emily Maddox on 02/04/19 at 10:45 AM EDT by a video enabled telemedicine application and verified that I am speaking with the correct person using two identifiers.   I discussed the limitations of evaluation and management by telemedicine and the availability of in person appointments. The patient expressed understanding and agreed to proceed.  History of Present Illness: Pt home requesting note for work -- she is high risk due to DM  HPI HYPERTENSION   Blood pressure range-low   Chest pain- no      Dyspnea- no Lightheadedness- no   Edema- no  Other side effects - no   Medication compliance: poor Low salt diet- yes     DIABETES    Blood Sugar ranges-good per pt  Polyuria- no New Visual problems- no  Hypoglycemic symptoms- no  Other side effects-no Medication compliance - good Last eye exam- due    HYPERLIPIDEMIA  Medication compliance- poor RUQ pain- no  Muscle aches- no Other side effects-no   ROS See HPI above   PMH Smoking Status noted       Observations/Objective: 112/80 p 102  Afebrile    Pt not regularly checking vs Pt in NAD Pt neatly dressed   Assessment and Plan: 1. Type 2 diabetes mellitus with hyperglycemia, unspecified whether long term insulin use (HCC) Check labs con't meds  - Comprehensive metabolic panel; Future - Hemoglobin A1c; Future  2. Hyperlipidemia associated with type 2 diabetes mellitus ([place]) Tolerating statin, encouraged heart healthy diet, avoid trans fats, minimize simple carbs and saturated fats. Increase exercise as tolerated  - Lipid panel; Future - Comprehensive metabolic panel; Future  3. Essential hypertension Well controlled, no changes to meds. Encouraged heart healthy diet such as the DASH diet and exercise as tolerated.   - Comprehensive metabolic panel; Future  4. 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 Increase exercise as tolerated. Continue current meds   5. Stress incontinence of urine  - POCT Urinalysis Dipstick (Automated); Future - Ambulatory referral to Urology   Follow Up Instructions:    I discussed the assessment and treatment plan with the patient. The patient was provided an opportunity to ask questions and all were answered. The patient agreed with the plan and demonstrated an understanding of the instructions.   The patient was advised to call back or seek an in-person evaluation if the symptoms worsen or if the condition fails to improve as anticipated.     [name], DO

## 2019-02-04 NOTE — Telephone Encounter (Signed)
Virtual visit scheduled.  

## 2019-02-05 ENCOUNTER — Other Ambulatory Visit: Payer: Medicare HMO

## 2019-02-05 NOTE — Telephone Encounter (Signed)
Left message to call the office to schedule lab appt at pt's earliest convenience.

## 2019-02-06 ENCOUNTER — Other Ambulatory Visit (INDEPENDENT_AMBULATORY_CARE_PROVIDER_SITE_OTHER): Payer: Medicare HMO

## 2019-02-06 ENCOUNTER — Other Ambulatory Visit: Payer: Self-pay

## 2019-02-06 DIAGNOSIS — E1169 Type 2 diabetes mellitus with other specified complication: Secondary | ICD-10-CM | POA: Diagnosis not present

## 2019-02-06 DIAGNOSIS — E785 Hyperlipidemia, unspecified: Secondary | ICD-10-CM | POA: Diagnosis not present

## 2019-02-06 DIAGNOSIS — E1165 Type 2 diabetes mellitus with hyperglycemia: Secondary | ICD-10-CM

## 2019-02-06 DIAGNOSIS — N393 Stress incontinence (female) (male): Secondary | ICD-10-CM | POA: Diagnosis not present

## 2019-02-06 DIAGNOSIS — I10 Essential (primary) hypertension: Secondary | ICD-10-CM | POA: Diagnosis not present

## 2019-02-06 LAB — POC URINALSYSI DIPSTICK (AUTOMATED)
Bilirubin, UA: NEGATIVE
Blood, UA: NEGATIVE
Glucose, UA: NEGATIVE
Ketones, UA: NEGATIVE
Leukocytes, UA: NEGATIVE
Nitrite, UA: NEGATIVE
Protein, UA: NEGATIVE
Spec Grav, UA: 1.02 (ref 1.010–1.025)
Urobilinogen, UA: NEGATIVE E.U./dL — AB
pH, UA: 5.5 (ref 5.0–8.0)

## 2019-02-07 ENCOUNTER — Telehealth: Payer: Self-pay | Admitting: Family Medicine

## 2019-02-07 ENCOUNTER — Other Ambulatory Visit: Payer: Self-pay | Admitting: Family Medicine

## 2019-02-07 DIAGNOSIS — E785 Hyperlipidemia, unspecified: Principal | ICD-10-CM

## 2019-02-07 DIAGNOSIS — E1169 Type 2 diabetes mellitus with other specified complication: Secondary | ICD-10-CM

## 2019-02-07 DIAGNOSIS — E1165 Type 2 diabetes mellitus with hyperglycemia: Secondary | ICD-10-CM

## 2019-02-07 LAB — LIPID PANEL
Cholesterol: 233 mg/dL — ABNORMAL HIGH (ref 0–200)
HDL: 63.3 mg/dL (ref >39.00)
LDL Cholesterol: 157 mg/dL — ABNORMAL HIGH (ref 0–99)
NonHDL: 169.42
Total CHOL/HDL Ratio: 4
Triglycerides: 64 mg/dL (ref 0.0–149.0)
VLDL: 12.8 mg/dL (ref 0.0–40.0)

## 2019-02-07 LAB — COMPREHENSIVE METABOLIC PANEL
ALT: 6 U/L (ref 0–35)
AST: 12 U/L (ref 0–37)
Albumin: 4.3 g/dL (ref 3.5–5.2)
Alkaline Phosphatase: 48 U/L (ref 39–117)
BUN: 10 mg/dL (ref 6–23)
CO2: 24 mEq/L (ref 19–32)
Calcium: 9.3 mg/dL (ref 8.4–10.5)
Chloride: 105 mEq/L (ref 96–112)
Creatinine, Ser: 0.94 mg/dL (ref 0.40–1.20)
GFR: 77.41 mL/min (ref >60.00)
Glucose, Bld: 93 mg/dL (ref 70–99)
Potassium: 4.5 mEq/L (ref 3.5–5.1)
Sodium: 138 mEq/L (ref 135–145)
Total Bilirubin: 0.4 mg/dL (ref 0.2–1.2)
Total Protein: 7 g/dL (ref 6.0–8.3)

## 2019-02-07 LAB — HEMOGLOBIN A1C: Hgb A1c MFr Bld: 6 % (ref 4.6–6.5)

## 2019-02-07 NOTE — Telephone Encounter (Signed)
Called pt and schedule lab appt on July 21,2020. Done

## 2019-02-07 NOTE — Telephone Encounter (Signed)
-----   Message from [name], DO sent at 02/07/2019  1:12 PM EDT ----- Cholesterol--- LDL goal < 100,  HDL >40,  TG < 150.  Diet and exercise will increase HDL and decrease LDL and TG.  Fish,  Fish Oil, Flaxseed oil will also help increase the HDL and decrease Triglycerides.   Recheck labs in 3 months----- change zocor to crestor 10 mg #30  1 po qhs, 2 refills   Diabetes controlled.

## 2019-02-17 ENCOUNTER — Other Ambulatory Visit: Payer: Self-pay | Admitting: *Deleted

## 2019-02-17 MED ORDER — ROSUVASTATIN CALCIUM 10 MG PO TABS: 10.0000 mg | ORAL_TABLET | Freq: Every day | ORAL | 2 refills | Status: DC

## 2019-03-05 IMAGING — CT CT ANGIO CHEST
2 of 6 series · 19 of 46 positions shown · IV contrast (Isovue)
Comparison: None.

CLINICAL DATA: Unexplained tachypnea. Recent air travel. PE
suspected. Intermediate probability

EXAM:
CT ANGIOGRAPHY CHEST WITH CONTRAST
TECHNIQUE: Multidetector CT imaging of the chest was performed using the
standard protocol during bolus administration of intravenous
contrast. Multiplanar CT image reconstructions and MIPs were
obtained to evaluate the vascular anatomy.
CONTRAST:  100mL SFB8HW-OAZ IOPAMIDOL (SFB8HW-OAZ) INJECTION 76%

[Series 5: thins · axial · 0.59mm/px · z∈[-175,+92]mm · 16 of 293 slices shown]
[im 13/293  lung]
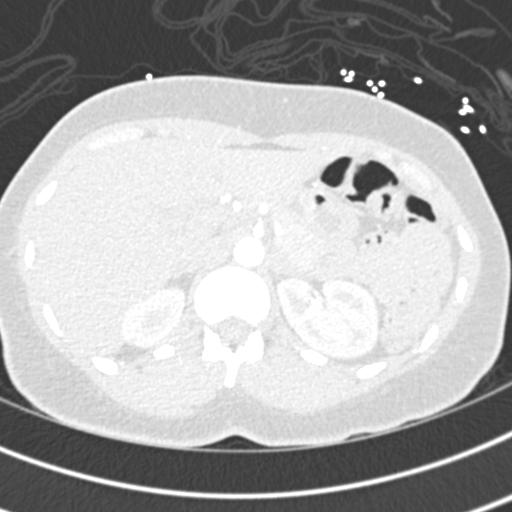
[im 39/293  soft-tissue]
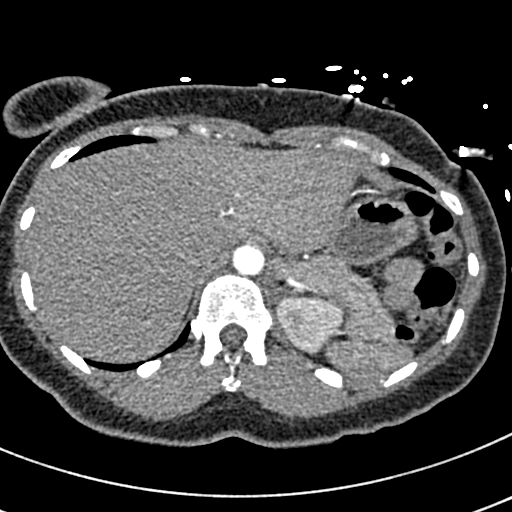
[im 51/293  lung]
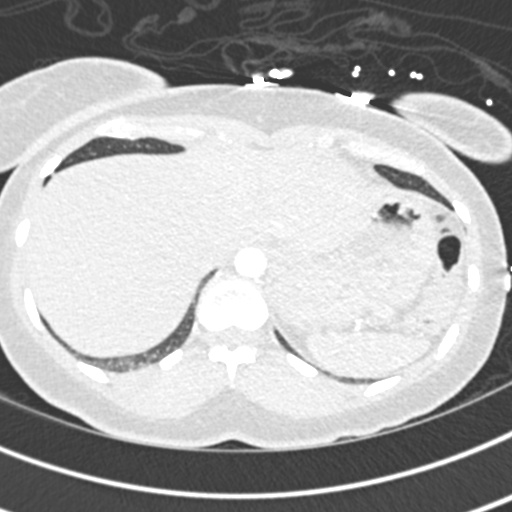
[im 64/293  soft-tissue]
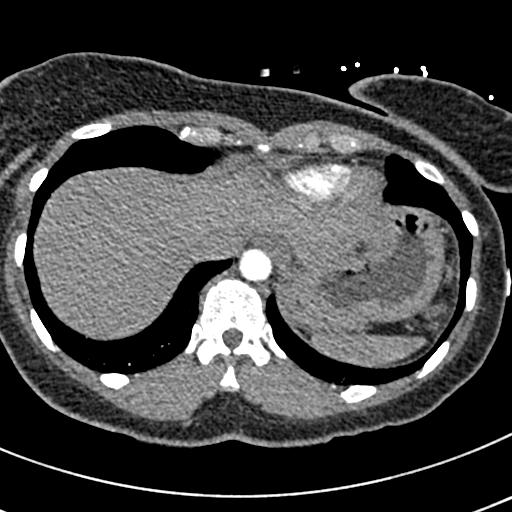
[im 89/293  lung]
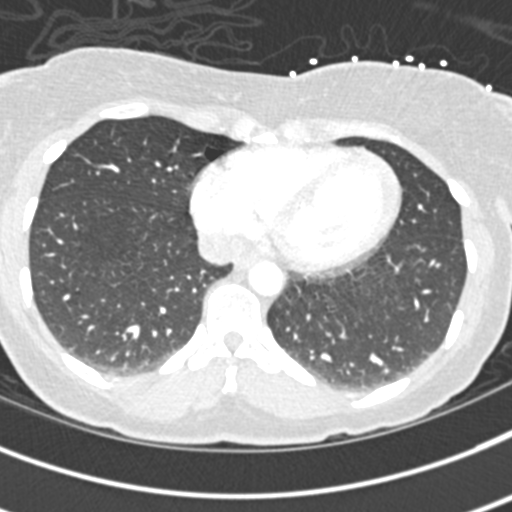
[im 102/293  soft-tissue]
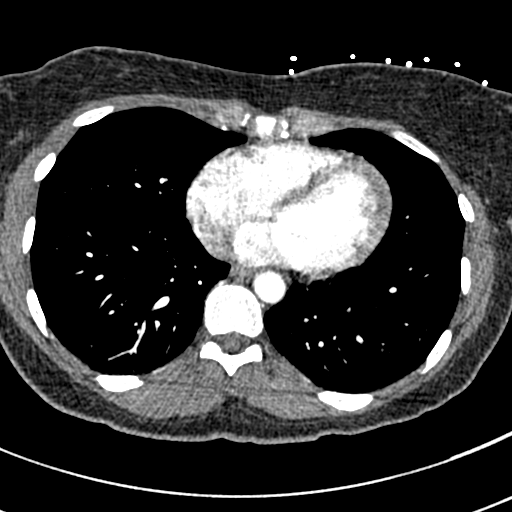
[im 115/293  lung]
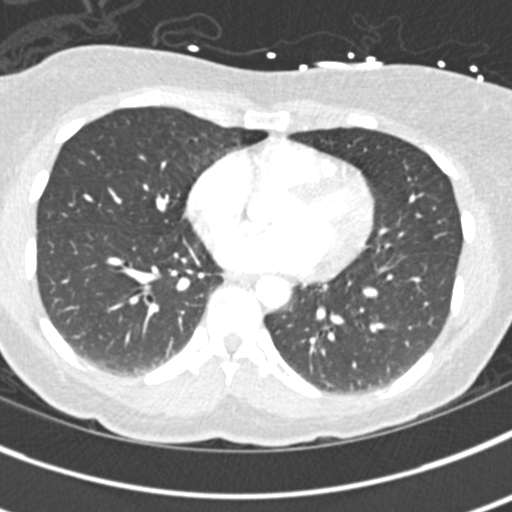
[im 140/293  soft-tissue]
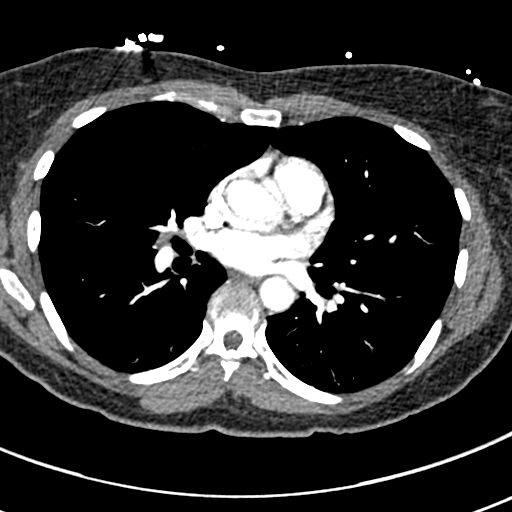
[im 153/293  lung]
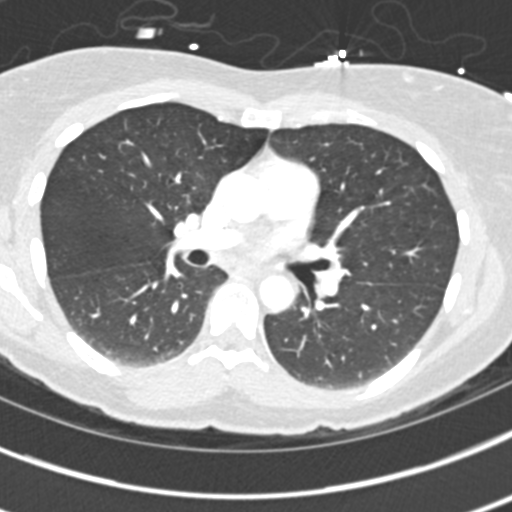
[im 178/293  soft-tissue]
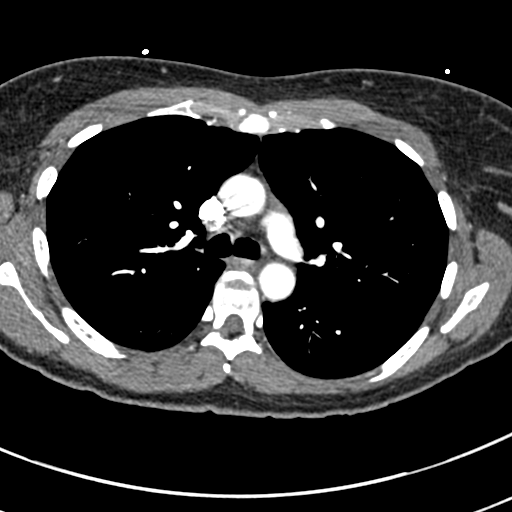
[im 191/293  lung]
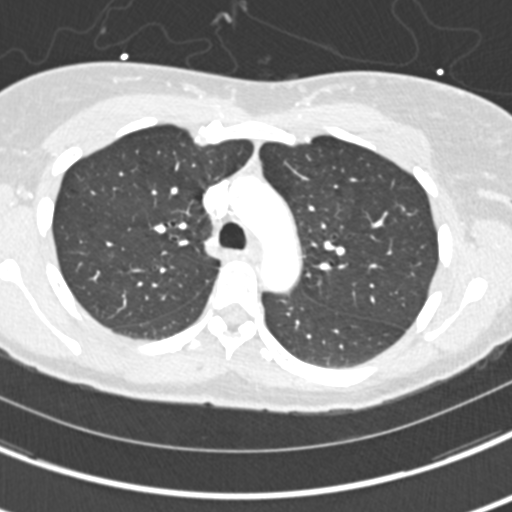
[im 204/293  soft-tissue]
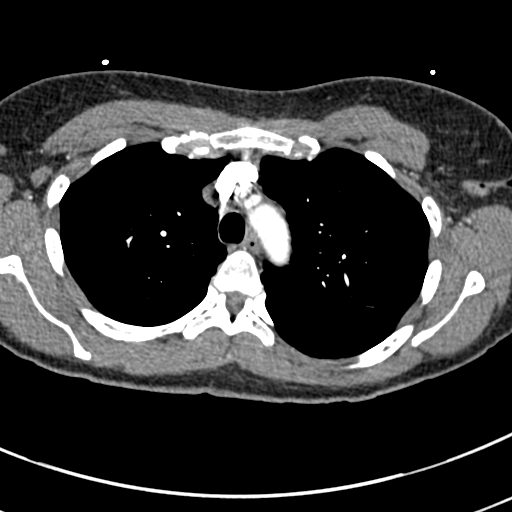
[im 229/293  lung]
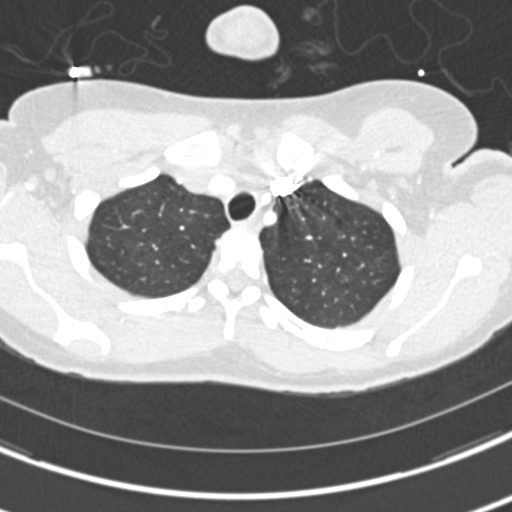
[im 242/293  soft-tissue]
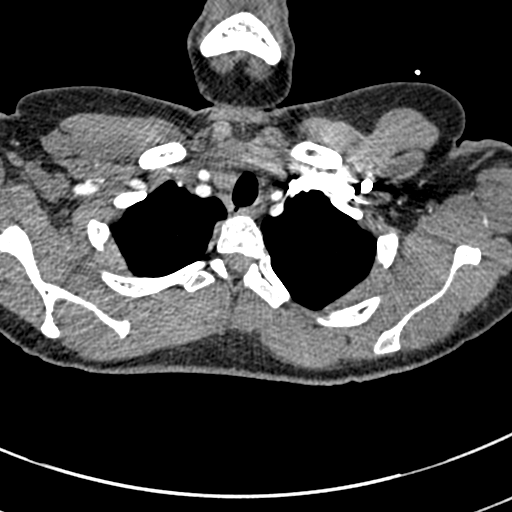
[im 254/293  lung]
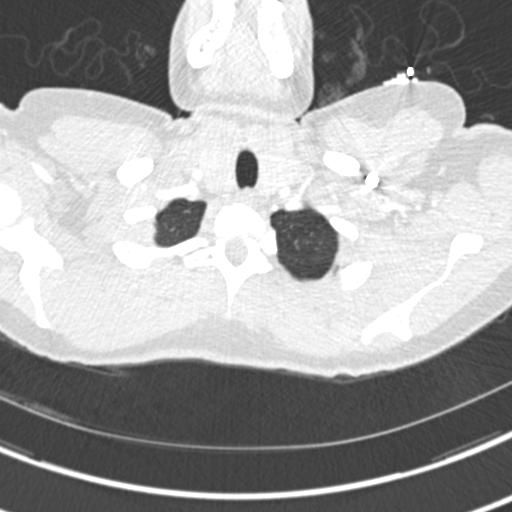
[im 280/293  soft-tissue]
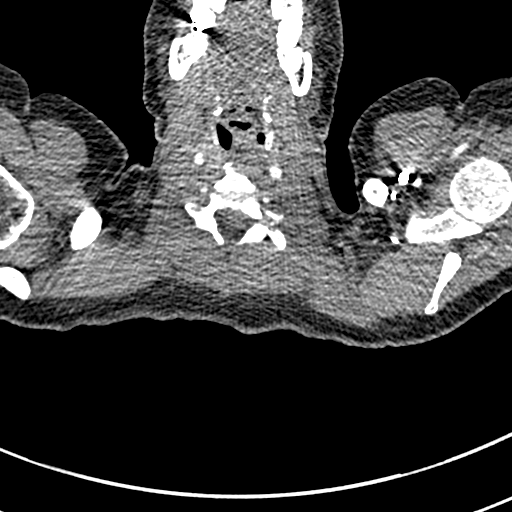

[Series 7: coronal mpr · coronal · 0.58mm/px · 3 of 116 slices shown]
[im 29/116  soft-tissue]
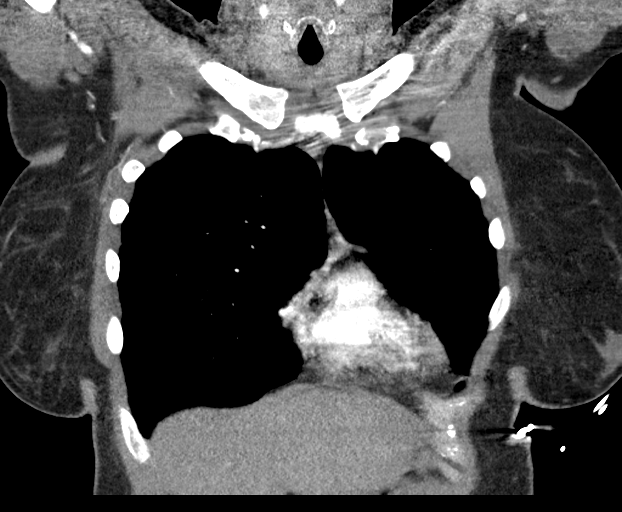
[im 58/116  soft-tissue]
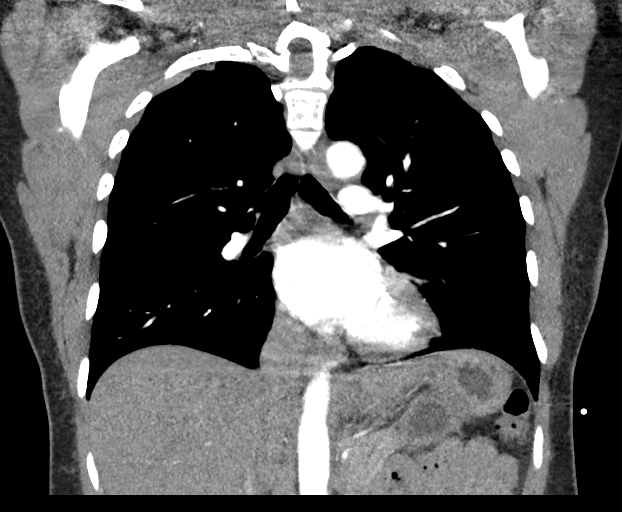
[im 87/116  soft-tissue]
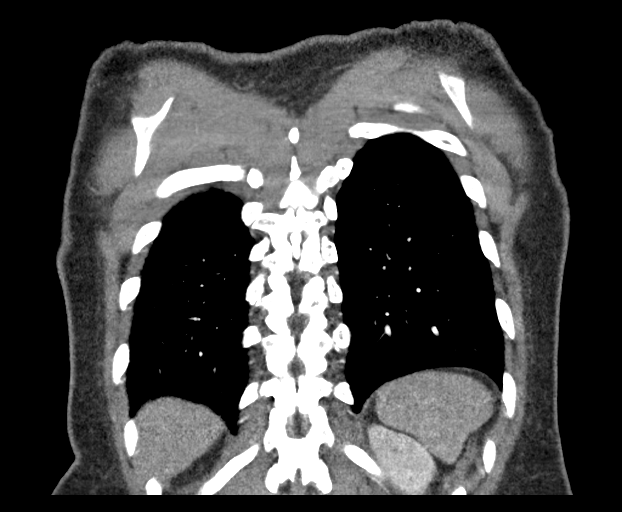

[19 of 46 positions shown; findings below may reference images not displayed]

FINDINGS: Cardiovascular: No filling defects within the pulmonary arteries to
suggest acute pulmonary embolism. No acute findings of the aorta or
great vessels. No pericardial fluid.

Mediastinum/Nodes: No axillary supraclavicular adenopathy. No
mediastinal hilar adenopathy. No pericardial effusion. Esophagus
normal.

Lungs/Pleura: Lungs are clear clear.  No infarction or infection.

Upper Abdomen: Limited view of the liver, kidneys, pancreas are
unremarkable. Normal adrenal glands.

Musculoskeletal: No acute osseous abnormality.

Review of the MIP images confirms the above findings.
IMPRESSION: 1. No acute pulmonary embolism.
2. No acute pulmonary parenchymal abnormality.

## 2019-03-19 ENCOUNTER — Telehealth: Payer: Self-pay | Admitting: Family Medicine

## 2019-03-19 ENCOUNTER — Other Ambulatory Visit: Payer: Self-pay

## 2019-03-19 DIAGNOSIS — G894 Chronic pain syndrome: Secondary | ICD-10-CM

## 2019-03-19 DIAGNOSIS — G8929 Other chronic pain: Secondary | ICD-10-CM

## 2019-03-19 NOTE — Telephone Encounter (Unsigned)
Copied from [place] [id number]. Topic: Quick Communication - Rx Refill/Question >> Mar 19, 2019  9:43 AM Alanda Slim E wrote: Medication: HYDROcodone-acetaminophen (NORCO) 10-325 MG tablet _ Pt would like a 90 day supply or a couple refills on the Rx so its a bit easier to get from pharmacy on time   Has the patient contacted their pharmacy? Yes- call pcp   Preferred Pharmacy (with phone number or street name): [place], [address] [phone] (Phone) [phone] (Fax)    Agent: Please be advised that RX refills may take up to 3 business days. We ask that you follow-up with your pharmacy.

## 2019-03-19 NOTE — Telephone Encounter (Signed)
Request sent to provider.

## 2019-03-19 NOTE — Telephone Encounter (Signed)
Requesting: NORCO Contract: 10/29/2017 UDS: 12/03/2018 Last OV: 02/04/2019 Next OV: N/A Last Refill: 01/13/2019, #60-0 RF Database:  Pt requesting to have refills added with the Rx.   Please advise

## 2019-03-20 MED ORDER — HYDROCODONE-ACETAMINOPHEN 10-325 MG PO TABS: 1.0000 | ORAL_TABLET | Freq: Three times a day (TID) | ORAL | 0 refills | Status: DC | PRN

## 2019-04-03 ENCOUNTER — Other Ambulatory Visit: Payer: Self-pay | Admitting: Family Medicine

## 2019-04-03 DIAGNOSIS — E1149 Type 2 diabetes mellitus with other diabetic neurological complication: Secondary | ICD-10-CM

## 2019-04-08 DIAGNOSIS — N3946 Mixed incontinence: Secondary | ICD-10-CM | POA: Diagnosis not present

## 2019-04-08 DIAGNOSIS — R350 Frequency of micturition: Secondary | ICD-10-CM | POA: Diagnosis not present

## 2019-04-12 DIAGNOSIS — R69 Illness, unspecified: Secondary | ICD-10-CM | POA: Diagnosis not present

## 2019-04-21 ENCOUNTER — Other Ambulatory Visit: Payer: Self-pay | Admitting: Family Medicine

## 2019-04-21 DIAGNOSIS — G894 Chronic pain syndrome: Secondary | ICD-10-CM

## 2019-04-21 DIAGNOSIS — G8929 Other chronic pain: Secondary | ICD-10-CM

## 2019-04-21 MED ORDER — HYDROCODONE-ACETAMINOPHEN 10-325 MG PO TABS: 1.0000 | ORAL_TABLET | Freq: Three times a day (TID) | ORAL | 0 refills | Status: DC | PRN

## 2019-04-21 NOTE — Telephone Encounter (Signed)
Hydrocodone refill.   Last OV; 02/04/2019  Last Fill: 03/20/2019 #60 and 0RF UDS: 12/03/2018 Low risk

## 2019-04-21 NOTE — Telephone Encounter (Signed)
Pt is calling and needs refill on hydrocodone and pt would like additional refills. [facility] [employer]

## 2019-05-13 ENCOUNTER — Other Ambulatory Visit: Payer: Medicare HMO

## 2019-05-21 ENCOUNTER — Other Ambulatory Visit: Payer: Self-pay | Admitting: Family Medicine

## 2019-05-21 ENCOUNTER — Other Ambulatory Visit: Payer: Self-pay

## 2019-05-21 DIAGNOSIS — G8929 Other chronic pain: Secondary | ICD-10-CM

## 2019-05-21 DIAGNOSIS — G894 Chronic pain syndrome: Secondary | ICD-10-CM

## 2019-05-21 NOTE — Progress Notes (Deleted)
Hydrocodone refill.   Last OV: 02/04/2019  Last Fill: 04/21/2019 #60 and 0RF UDS: 12/03/2018 Low risk

## 2019-05-21 NOTE — Telephone Encounter (Signed)
Hydrocodone refill.   Last OV: 02/04/2019  Last Fill: 04/21/2019 #60 and 0RF UDS: 12/03/2018 Low risk

## 2019-05-21 NOTE — Telephone Encounter (Signed)
Medication Refill - Medication: HYDROcodone-acetaminophen (NORCO) 10-325 MG tablet  Has the patient contacted their pharmacy? no (Agent: If no, request that the patient contact the pharmacy for the refill.) (Agent: If yes, when and what did the pharmacy advise?)  Preferred Pharmacy (with phone number or street name):  [place], [address] [phone] (Phone) [phone] (Fax)     Agent: Please be advised that RX refills may take up to 3 business days. We ask that you follow-up with your pharmacy.

## 2019-05-22 MED ORDER — HYDROCODONE-ACETAMINOPHEN 10-325 MG PO TABS: 1.0000 | ORAL_TABLET | Freq: Three times a day (TID) | ORAL | 0 refills | Status: DC | PRN

## 2019-06-12 DIAGNOSIS — R69 Illness, unspecified: Secondary | ICD-10-CM | POA: Diagnosis not present

## 2019-06-23 ENCOUNTER — Other Ambulatory Visit: Payer: Self-pay | Admitting: Family Medicine

## 2019-06-23 DIAGNOSIS — G8929 Other chronic pain: Secondary | ICD-10-CM

## 2019-06-23 DIAGNOSIS — G894 Chronic pain syndrome: Secondary | ICD-10-CM

## 2019-06-23 MED ORDER — HYDROCODONE-ACETAMINOPHEN 10-325 MG PO TABS: 1.0000 | ORAL_TABLET | Freq: Three times a day (TID) | ORAL | 0 refills | Status: DC | PRN

## 2019-06-23 NOTE — Telephone Encounter (Signed)
Medication Refill - Medication: HYDROcodone-acetaminophen (NORCO) 10-325 MG tablet   Has the patient contacted their pharmacy? No. (Agent: If no, request that the patient contact the pharmacy for the refill.) (Agent: If yes, when and what did the pharmacy advise?)  Preferred Pharmacy (with phone number or street name): [place], [place] [address] [phone] (Phone) [phone] (Fax)     Agent: Please be advised that RX refills may take up to 3 business days. We ask that you follow-up with your pharmacy.

## 2019-06-23 NOTE — Telephone Encounter (Signed)
Requested medication (s) are due for refill today: yes Requested medication (s) are on the active medication list:yes Last refill:  05/22/2019 Future visit scheduled: no  Notes to clinic:  This refill cannot be delegated   Requested Prescriptions  Pending Prescriptions Disp Refills   HYDROcodone-acetaminophen (NORCO) 10-325 MG tablet 60 tablet 0    Sig: Take 1 tablet by mouth every 8 (eight) hours as needed.     Not Delegated - Analgesics:  Opioid Agonist Combinations Failed - 06/23/2019 12:30 PM      Failed - This refill cannot be delegated      Passed - Urine Drug Screen completed in last 360 days.      Passed - Valid encounter within last 6 months    Recent Outpatient Visits          4 months ago Type 2 diabetes mellitus with hyperglycemia, unspecified whether long term insulin use ([place])   [facility] at [place] [facility], DO   7 months ago B12 deficiency   [facility] at [place], DO   7 months ago Body aches   [facility] at [place], DO   10 months ago Hyperglycemia   [facility] at [place], DO   1 year ago [facility] at [place], [place]

## 2019-07-09 ENCOUNTER — Telehealth: Payer: Self-pay

## 2019-07-09 ENCOUNTER — Ambulatory Visit (INDEPENDENT_AMBULATORY_CARE_PROVIDER_SITE_OTHER): Payer: Medicare HMO | Admitting: Family Medicine

## 2019-07-09 ENCOUNTER — Other Ambulatory Visit: Payer: Self-pay | Admitting: Family Medicine

## 2019-07-09 ENCOUNTER — Other Ambulatory Visit: Payer: Self-pay

## 2019-07-09 ENCOUNTER — Encounter: Payer: Self-pay | Admitting: Family Medicine

## 2019-07-09 ENCOUNTER — Telehealth: Payer: Self-pay | Admitting: Family Medicine

## 2019-07-09 DIAGNOSIS — E1165 Type 2 diabetes mellitus with hyperglycemia: Secondary | ICD-10-CM | POA: Diagnosis not present

## 2019-07-09 DIAGNOSIS — M797 Fibromyalgia: Secondary | ICD-10-CM

## 2019-07-09 DIAGNOSIS — IMO0002 Reserved for concepts with insufficient information to code with codable children: Secondary | ICD-10-CM

## 2019-07-09 DIAGNOSIS — E1169 Type 2 diabetes mellitus with other specified complication: Secondary | ICD-10-CM | POA: Diagnosis not present

## 2019-07-09 DIAGNOSIS — E785 Hyperlipidemia, unspecified: Secondary | ICD-10-CM

## 2019-07-09 DIAGNOSIS — E1151 Type 2 diabetes mellitus with diabetic peripheral angiopathy without gangrene: Secondary | ICD-10-CM

## 2019-07-09 NOTE — Assessment & Plan Note (Signed)
Pt hight risk for covid due to fibro, dm  Note in my chart  Pt will drop off fmla

## 2019-07-09 NOTE — Assessment & Plan Note (Signed)
Encouraged heart healthy diet, increase exercise, avoid trans fats, consider a krill oil cap daily 

## 2019-07-09 NOTE — Telephone Encounter (Signed)
PT dropped off document to be filled out by provider ( FMLA -4 pages) Pt would like to be called when document ready to pick up at [phone]. Document put at front office tray under provider name.

## 2019-07-09 NOTE — Progress Notes (Signed)
Virtual Visit via Telephone Note  I connected with Emily Maddox on 07/09/19 at 11:40 AM EDT by telephone and verified that I am speaking with the correct person using two identifiers.  Location: Patient:work  Provider: home    I discussed the limitations, risks, security and privacy concerns of performing an evaluation and management service by telephone and the availability of in person appointments. I also discussed with the patient that there may be a patient responsible charge related to this service. The patient expressed understanding and agreed to proceed.   History of Present Pt is home and needs fmla papers   she is high risk for covid due to fibromyalgia and is unable to work from home due to no internet  Her job told her she needs to take a leave of absence  Observations/Objective: No vitals obtained  Pt in nad   Assessment and Plan: 1. Fibromyalgia fmla to be filled out  Note in my chart   2. DM (diabetes mellitus) type II uncontrolled, periph vascular disorder ([place]) Check labs next week    3. Hyperlipidemia associated with type 2 diabetes mellitus ([place]) Encouraged heart healthy diet, increase exercise, avoid trans fats, consider a krill oil cap daily   Follow Up Instructions:    I discussed the assessment and treatment plan with the patient. The patient was provided an opportunity to ask questions and all were answered. The patient agreed with the plan and demonstrated an understanding of the instructions.   The patient was advised to call back or seek an in-person evaluation if the symptoms worsen or if the condition fails to improve as anticipated.  I provided 15  minutes of non-face-to-face time during this encounter.   [name], DO

## 2019-07-09 NOTE — Telephone Encounter (Signed)
Copied from [place] [id number]. Topic: General - Other >> Jul 08, 2019 10:11 AM Keene Breath wrote: Reason for CRM: Patient called because she needs a letter for her family medical leave, which says from 06/17/19 to the end of the year.  Please call patient to discuss so that this can be taken care of as soon as possible.  CB# [phone]  **Scheduled virtual visit for today and lab appointment for next week.

## 2019-07-09 NOTE — Assessment & Plan Note (Signed)
Lab to be done soon fmla to be filled out

## 2019-07-11 ENCOUNTER — Telehealth: Payer: Self-pay

## 2019-07-11 DIAGNOSIS — Z0279 Encounter for issue of other medical certificate: Secondary | ICD-10-CM

## 2019-07-11 NOTE — Telephone Encounter (Signed)
Pt needed virtual visit to complete FMLA paperwork. Pt has appt on Monday with [facility]

## 2019-07-11 NOTE — Telephone Encounter (Signed)
FMLA forms complete. Spoke with patient and she will pick up forms on Monday. Original copy placed in the front for pick up.

## 2019-07-17 ENCOUNTER — Other Ambulatory Visit: Payer: Medicare HMO

## 2019-07-24 ENCOUNTER — Telehealth: Payer: Self-pay | Admitting: Family Medicine

## 2019-07-24 DIAGNOSIS — G894 Chronic pain syndrome: Secondary | ICD-10-CM

## 2019-07-24 DIAGNOSIS — G8929 Other chronic pain: Secondary | ICD-10-CM

## 2019-07-24 MED ORDER — HYDROCODONE-ACETAMINOPHEN 10-325 MG PO TABS: 1.0000 | ORAL_TABLET | Freq: Three times a day (TID) | ORAL | 0 refills | Status: DC | PRN

## 2019-07-24 NOTE — Telephone Encounter (Signed)
Medication Refill - Medication: HYDROcodone-acetaminophen (NORCO) 10-325 MG tablet    Has the patient contacted their pharmacy? No. (Agent: If no, request that the patient contact the pharmacy for the refill.) (Agent: If yes, when and what did the pharmacy advise?)  Preferred Pharmacy (with phone number or street name): [place] [facility], [address] [phone] (Phone) [phone] (Fax)     Agent: Please be advised that RX refills may take up to 3 business days. We ask that you follow-up with your pharmacy.

## 2019-07-24 NOTE — Telephone Encounter (Signed)
Sent!

## 2019-07-24 NOTE — Telephone Encounter (Signed)
Requesting: NORCO Contract: 10/29/2017 UDS: 12/03/2018 Last OV: 07/09/2019 Next OV: N/A Last Refill: 06/23/2019, #60--0 RF Database:   Please advise

## 2019-07-30 ENCOUNTER — Other Ambulatory Visit: Payer: Medicare HMO

## 2019-07-31 ENCOUNTER — Other Ambulatory Visit (INDEPENDENT_AMBULATORY_CARE_PROVIDER_SITE_OTHER): Payer: Medicare HMO

## 2019-07-31 ENCOUNTER — Other Ambulatory Visit: Payer: Self-pay

## 2019-07-31 DIAGNOSIS — E1165 Type 2 diabetes mellitus with hyperglycemia: Secondary | ICD-10-CM

## 2019-07-31 DIAGNOSIS — E785 Hyperlipidemia, unspecified: Secondary | ICD-10-CM

## 2019-07-31 DIAGNOSIS — E1169 Type 2 diabetes mellitus with other specified complication: Secondary | ICD-10-CM

## 2019-07-31 LAB — COMPREHENSIVE METABOLIC PANEL
ALT: 6 U/L (ref 0–35)
AST: 12 U/L (ref 0–37)
Albumin: 4.1 g/dL (ref 3.5–5.2)
Alkaline Phosphatase: 45 U/L (ref 39–117)
BUN: 12 mg/dL (ref 6–23)
CO2: 24 mEq/L (ref 19–32)
Calcium: 9.3 mg/dL (ref 8.4–10.5)
Chloride: 106 mEq/L (ref 96–112)
Creatinine, Ser: 0.85 mg/dL (ref 0.40–1.20)
GFR: 86.77 mL/min (ref >60.00)
Glucose, Bld: 84 mg/dL (ref 70–99)
Potassium: 4.5 mEq/L (ref 3.5–5.1)
Sodium: 137 mEq/L (ref 135–145)
Total Bilirubin: 0.4 mg/dL (ref 0.2–1.2)
Total Protein: 6.7 g/dL (ref 6.0–8.3)

## 2019-07-31 LAB — LIPID PANEL
Cholesterol: 214 mg/dL — ABNORMAL HIGH (ref 0–200)
HDL: 65.2 mg/dL (ref >39.00)
LDL Cholesterol: 131 mg/dL — ABNORMAL HIGH (ref 0–99)
NonHDL: 149.19
Total CHOL/HDL Ratio: 3
Triglycerides: 91 mg/dL (ref 0.0–149.0)
VLDL: 18.2 mg/dL (ref 0.0–40.0)

## 2019-07-31 LAB — HEMOGLOBIN A1C: Hgb A1c MFr Bld: 6 % (ref 4.6–6.5)

## 2019-08-02 ENCOUNTER — Other Ambulatory Visit: Payer: Self-pay | Admitting: Family Medicine

## 2019-08-02 DIAGNOSIS — E785 Hyperlipidemia, unspecified: Secondary | ICD-10-CM

## 2019-08-05 ENCOUNTER — Other Ambulatory Visit: Payer: Self-pay

## 2019-08-05 MED ORDER — ROSUVASTATIN CALCIUM 20 MG PO TABS: 20.0000 mg | ORAL_TABLET | Freq: Every day | ORAL | 2 refills | Status: DC

## 2019-08-19 DIAGNOSIS — R69 Illness, unspecified: Secondary | ICD-10-CM | POA: Diagnosis not present

## 2019-08-22 ENCOUNTER — Other Ambulatory Visit: Payer: Self-pay | Admitting: Family Medicine

## 2019-08-22 DIAGNOSIS — E1149 Type 2 diabetes mellitus with other diabetic neurological complication: Secondary | ICD-10-CM

## 2019-08-22 DIAGNOSIS — G894 Chronic pain syndrome: Secondary | ICD-10-CM

## 2019-08-22 DIAGNOSIS — G8929 Other chronic pain: Secondary | ICD-10-CM

## 2019-08-22 NOTE — Telephone Encounter (Signed)
Copied from [place] [id number]. Topic: Quick Communication - Rx Refill/Question >> Aug 22, 2019  9:22 AM Rainey Pines A wrote: Medication: HYDROcodone-acetaminophen (NORCO) 10-325 MG tablet   Has the patient contacted their pharmacy? {Yes (Agent: If no, request that the patient contact the pharmacy for the refill.) (Agent: If yes, when and what did the pharmacy advise?)Contact PCP Preferred Pharmacy (with phone number or street name): [place], [place] [phone] (Phone) [phone] (Fax)    Agent: Please be advised that RX refills may take up to 3 business days. We ask that you follow-up with your pharmacy.

## 2019-08-22 NOTE — Telephone Encounter (Signed)
Requested medication (s) are due for refill today: yes  Requested medication (s) are on the active medication list: yes  Last refill:  07/24/2019  Future visit scheduled: no  Notes to clinic:  Refill cannot be delegated    Requested Prescriptions  Pending Prescriptions Disp Refills   HYDROcodone-acetaminophen (NORCO) 10-325 MG tablet 60 tablet 0    Sig: Take 1 tablet by mouth every 8 (eight) hours as needed.     Not Delegated - Analgesics:  Opioid Agonist Combinations Failed - 08/22/2019  9:25 AM      Failed - This refill cannot be delegated      Passed - Urine Drug Screen completed in last 360 days.      Passed - Valid encounter within last 6 months    Recent Outpatient Visits          1 month ago [place] at [place], [place]   6 months ago Type 2 diabetes mellitus with hyperglycemia, unspecified whether long term insulin use ([place])   [facility] at [facility] [facility], [place]   9 months ago B12 deficiency   [facility] at [place] [facility], DO   9 months ago Body aches   [facility] at [place] [facility], [place]   12 months ago Hyperglycemia   Arch[facility] at [facility], [place], [place]

## 2019-08-25 MED ORDER — HYDROCODONE-ACETAMINOPHEN 10-325 MG PO TABS: 1.0000 | ORAL_TABLET | Freq: Three times a day (TID) | ORAL | 0 refills | Status: DC | PRN

## 2019-08-25 NOTE — Telephone Encounter (Signed)
Last written: 07/24/19 Last ov: 07/09/19 Next ov: nothing scheduled Contract: due  UDS: due

## 2019-09-04 ENCOUNTER — Other Ambulatory Visit: Payer: Self-pay

## 2019-09-04 ENCOUNTER — Ambulatory Visit
Admission: RE | Admit: 2019-09-04 | Discharge: 2019-09-04 | Disposition: A | Discharge status: Home / Self Care | Payer: Medicare HMO | Source: Ambulatory Visit | Attending: Family Medicine | Admitting: Family Medicine

## 2019-09-04 ENCOUNTER — Other Ambulatory Visit: Payer: Self-pay | Admitting: Family Medicine

## 2019-09-04 DIAGNOSIS — Z1231 Encounter for screening mammogram for malignant neoplasm of breast: Secondary | ICD-10-CM | POA: Diagnosis not present

## 2019-09-24 ENCOUNTER — Other Ambulatory Visit: Payer: Self-pay | Admitting: Family Medicine

## 2019-09-24 DIAGNOSIS — G8929 Other chronic pain: Secondary | ICD-10-CM

## 2019-09-24 DIAGNOSIS — G894 Chronic pain syndrome: Secondary | ICD-10-CM

## 2019-09-24 NOTE — Telephone Encounter (Signed)
Requesting:NORCO Contract: 12/03/2018  UDS: 12/03/2018, low risk Last OV: 07/09/2019  Next OV: N/A Last Refill: 08/25/2019, #60--0 RF Database:   Please advise

## 2019-09-24 NOTE — Telephone Encounter (Signed)
rx refill HYDROcodone-acetaminophen (NORCO) 10-325 MG tablet  [place], [place] [address]

## 2019-09-25 MED ORDER — HYDROCODONE-ACETAMINOPHEN 10-325 MG PO TABS: 1.0000 | ORAL_TABLET | Freq: Three times a day (TID) | ORAL | 0 refills | Status: DC | PRN

## 2019-10-27 ENCOUNTER — Ambulatory Visit (INDEPENDENT_AMBULATORY_CARE_PROVIDER_SITE_OTHER): Payer: Medicare HMO | Admitting: Family Medicine

## 2019-10-27 ENCOUNTER — Encounter: Payer: Self-pay | Admitting: Family Medicine

## 2019-10-27 ENCOUNTER — Other Ambulatory Visit: Payer: Self-pay

## 2019-10-27 ENCOUNTER — Other Ambulatory Visit: Payer: Self-pay | Admitting: Family Medicine

## 2019-10-27 DIAGNOSIS — G894 Chronic pain syndrome: Secondary | ICD-10-CM

## 2019-10-27 DIAGNOSIS — M5136 Other intervertebral disc degeneration, lumbar region: Secondary | ICD-10-CM | POA: Diagnosis not present

## 2019-10-27 DIAGNOSIS — G8929 Other chronic pain: Secondary | ICD-10-CM

## 2019-10-27 MED ORDER — HYDROCODONE-ACETAMINOPHEN 10-325 MG PO TABS: 1.0000 | ORAL_TABLET | Freq: Three times a day (TID) | ORAL | 0 refills | Status: DC | PRN

## 2019-10-27 NOTE — Telephone Encounter (Signed)
Medication Refill - Medication: HYDROcodone-acetaminophen (NORCO) 10-325 MG tablet    Has the patient contacted their pharmacy? No. (Agent: If no, request that the patient contact the pharmacy for the refill.) (Agent: If yes, when and what did the pharmacy advise?)  Preferred Pharmacy (with phone number or street name):  [facility] [place], [place] [address] Phone:  [phone]  Fax:  [phone]       Agent: Please be advised that RX refills may take up to 3 business days. We ask that you follow-up with your pharmacy.

## 2019-10-27 NOTE — Telephone Encounter (Signed)
Requesting: NORCO Contract:   UDS: 12/03/2018 Last OV: 10/27/2019 Next OV: N/A Last Refill: 09/25/2019 Database:   Please advise

## 2019-10-27 NOTE — Progress Notes (Signed)
Virtual Visit via Video Note  I connected with Emily Maddox on 10/27/19 at  3:40 PM EST by a video enabled telemedicine application and verified that I am speaking with the correct person using two identifiers.  Location: Patient: home alone  Provider: office    I discussed the limitations of evaluation and management by telemedicine and the availability of in person appointments. The patient expressed understanding and agreed to proceed.  History of Present Illness: Pt is home and needs extended leave from work due to covid.     Observations/Objective: There were no vitals filed for this visit.  Pt has same back pain x years   It has been worse recently due to sitting for a long time   Assessment and Plan: 1. Lumbar degenerative disc disease Chronic pain refil meds  Extend leave  rto 3 months    Follow Up Instructions:    I discussed the assessment and treatment plan with the patient. The patient was provided an opportunity to ask questions and all were answered. The patient agreed with the plan and demonstrated an understanding of the instructions.   The patient was advised to call back or seek an in-person evaluation if the symptoms worsen or if the condition fails to improve as anticipated.  I provided 15 minutes of non-face-to-face time during this encounter.   [name], DO

## 2019-10-27 NOTE — Assessment & Plan Note (Signed)
Stable con't meds 

## 2019-11-21 ENCOUNTER — Other Ambulatory Visit: Payer: Self-pay | Admitting: Family Medicine

## 2019-11-21 DIAGNOSIS — E1149 Type 2 diabetes mellitus with other diabetic neurological complication: Secondary | ICD-10-CM

## 2019-11-24 ENCOUNTER — Other Ambulatory Visit: Payer: Self-pay | Admitting: Family Medicine

## 2019-11-24 ENCOUNTER — Telehealth: Payer: Self-pay

## 2019-11-24 DIAGNOSIS — R69 Illness, unspecified: Secondary | ICD-10-CM | POA: Diagnosis not present

## 2019-11-24 DIAGNOSIS — G894 Chronic pain syndrome: Secondary | ICD-10-CM

## 2019-11-24 DIAGNOSIS — G8929 Other chronic pain: Secondary | ICD-10-CM

## 2019-11-24 MED ORDER — HYDROCODONE-ACETAMINOPHEN 10-325 MG PO TABS: 1.0000 | ORAL_TABLET | Freq: Three times a day (TID) | ORAL | 0 refills | Status: DC | PRN

## 2019-11-24 NOTE — Telephone Encounter (Signed)
Patient called in to get Dr. Etter Sjogren to call in her prescription (Hydrocodone mg10)  Please follow up with the patient at [phone] thanks.

## 2019-11-24 NOTE — Telephone Encounter (Signed)
Last written: 10/27/19 Last ov: 10/27/19 Next ov: none Contract: 10/29/17 UDS: 12/03/18

## 2019-12-01 ENCOUNTER — Other Ambulatory Visit: Payer: Self-pay

## 2019-12-01 DIAGNOSIS — E1165 Type 2 diabetes mellitus with hyperglycemia: Secondary | ICD-10-CM

## 2019-12-01 DIAGNOSIS — E785 Hyperlipidemia, unspecified: Secondary | ICD-10-CM

## 2019-12-02 ENCOUNTER — Other Ambulatory Visit: Payer: Medicare HMO

## 2019-12-05 ENCOUNTER — Other Ambulatory Visit (INDEPENDENT_AMBULATORY_CARE_PROVIDER_SITE_OTHER): Payer: Medicare HMO

## 2019-12-05 ENCOUNTER — Other Ambulatory Visit: Payer: Self-pay

## 2019-12-05 DIAGNOSIS — E1165 Type 2 diabetes mellitus with hyperglycemia: Secondary | ICD-10-CM

## 2019-12-05 DIAGNOSIS — E785 Hyperlipidemia, unspecified: Secondary | ICD-10-CM

## 2019-12-05 LAB — LIPID PANEL
Cholesterol: 216 mg/dL — ABNORMAL HIGH (ref 0–200)
HDL: 67.4 mg/dL (ref >39.00)
LDL Cholesterol: 136 mg/dL — ABNORMAL HIGH (ref 0–99)
NonHDL: 148.59
Total CHOL/HDL Ratio: 3
Triglycerides: 65 mg/dL (ref 0.0–149.0)
VLDL: 13 mg/dL (ref 0.0–40.0)

## 2019-12-05 LAB — HEMOGLOBIN A1C: Hgb A1c MFr Bld: 6 % (ref 4.6–6.5)

## 2019-12-05 LAB — COMPREHENSIVE METABOLIC PANEL
ALT: 8 U/L (ref 0–35)
AST: 12 U/L (ref 0–37)
Albumin: 4.1 g/dL (ref 3.5–5.2)
Alkaline Phosphatase: 48 U/L (ref 39–117)
BUN: 13 mg/dL (ref 6–23)
CO2: 27 mEq/L (ref 19–32)
Calcium: 9 mg/dL (ref 8.4–10.5)
Chloride: 104 mEq/L (ref 96–112)
Creatinine, Ser: 0.92 mg/dL (ref 0.40–1.20)
GFR: 79.08 mL/min (ref >60.00)
Glucose, Bld: 89 mg/dL (ref 70–99)
Potassium: 4.6 mEq/L (ref 3.5–5.1)
Sodium: 138 mEq/L (ref 135–145)
Total Bilirubin: 0.4 mg/dL (ref 0.2–1.2)
Total Protein: 6.8 g/dL (ref 6.0–8.3)

## 2019-12-07 ENCOUNTER — Other Ambulatory Visit: Payer: Self-pay | Admitting: Family Medicine

## 2019-12-07 DIAGNOSIS — E1165 Type 2 diabetes mellitus with hyperglycemia: Secondary | ICD-10-CM

## 2019-12-07 DIAGNOSIS — E1169 Type 2 diabetes mellitus with other specified complication: Secondary | ICD-10-CM

## 2019-12-09 ENCOUNTER — Other Ambulatory Visit: Payer: Self-pay

## 2019-12-09 MED ORDER — ROSUVASTATIN CALCIUM 40 MG PO TABS: 40.0000 mg | ORAL_TABLET | Freq: Every day | ORAL | 1 refills | Status: DC

## 2019-12-15 ENCOUNTER — Telehealth: Payer: Self-pay | Admitting: Family Medicine

## 2019-12-15 ENCOUNTER — Encounter: Payer: Self-pay | Admitting: Family Medicine

## 2019-12-15 ENCOUNTER — Ambulatory Visit (INDEPENDENT_AMBULATORY_CARE_PROVIDER_SITE_OTHER): Payer: Medicare HMO | Admitting: Family Medicine

## 2019-12-15 ENCOUNTER — Other Ambulatory Visit: Payer: Self-pay

## 2019-12-15 VITALS — BP 120/80 | Temp 97.9°F | Ht 66.0 in | Wt 160.0 lb

## 2019-12-15 DIAGNOSIS — R1030 Lower abdominal pain, unspecified: Secondary | ICD-10-CM | POA: Diagnosis not present

## 2019-12-15 DIAGNOSIS — Z86018 Personal history of other benign neoplasm: Secondary | ICD-10-CM | POA: Diagnosis not present

## 2019-12-15 NOTE — Progress Notes (Signed)
Virtual Visit via Video Note  I connected with Emily Maddox on 12/15/19 at  3:20 PM EST by a video enabled telemedicine application and verified that I am speaking with the correct person using two identifiers.  Location: Patient: home alone  Provider: office    I discussed the limitations of evaluation and management by telemedicine and the availability of in person appointments. The patient expressed understanding and agreed to proceed.  History of Present Illness: Pt is home c/o abd cramping -- like her menstrual cramps but much worse  She is still spotting    Observations/Objective: Vitals:   12/15/19 1527  BP: 120/80  Temp: 97.9 F (36.6 C)   Pt is in nad  Assessment and Plan: 1. Lower abdominal pain Pt will call gyn--- if they are unable to see her tomorrow we have ordered an UA pelvis con't with pain meds prn  To er if pain worsens  - US Pelvic Complete With Transvaginal; Future  2. History of uterine fibroid Pt will g/u with gyn  To er if pain worsens  - US Pelvic Complete With Transvaginal; Future   Follow Up Instructions:    I discussed the assessment and treatment plan with the patient. The patient was provided an opportunity to ask questions and all were answered. The patient agreed with the plan and demonstrated an understanding of the instructions.   The patient was advised to call back or seek an in-person evaluation if the symptoms worsen or if the condition fails to improve as anticipated.  I provided 25 minutes of non-face-to-face time during this encounter.   [name], DO

## 2019-12-15 NOTE — Telephone Encounter (Signed)
Great--- we can cancel to [place] here

## 2019-12-15 NOTE — Telephone Encounter (Signed)
FYI

## 2019-12-15 NOTE — Telephone Encounter (Signed)
Patient was told to call office back. Per Dr. Etter Sjogren -Emily Maddox ,Patient was able to get in with OBGYN doctor on 12/16/2019 at 1000am [facility] morris md).

## 2019-12-16 DIAGNOSIS — D259 Leiomyoma of uterus, unspecified: Secondary | ICD-10-CM | POA: Diagnosis not present

## 2019-12-22 ENCOUNTER — Other Ambulatory Visit: Payer: Self-pay

## 2019-12-22 DIAGNOSIS — G894 Chronic pain syndrome: Secondary | ICD-10-CM

## 2019-12-22 DIAGNOSIS — G8929 Other chronic pain: Secondary | ICD-10-CM

## 2019-12-22 NOTE — Telephone Encounter (Signed)
Requesting: NORCO Contract: 10/29/2017 UDS: 12/03/18 Last OV: 12/15/19 Next OV: N/A Last Refill: 11/24/19, #60--0 RF Database:   Please advise

## 2019-12-22 NOTE — Telephone Encounter (Signed)
Patient called in to see if Dr. Etter Sjogren could send in a prescription for HYDROcodone-acetaminophen (NORCO) 10-325 MG tablet

## 2019-12-23 MED ORDER — HYDROCODONE-ACETAMINOPHEN 10-325 MG PO TABS: 1.0000 | ORAL_TABLET | Freq: Three times a day (TID) | ORAL | 0 refills | Status: DC | PRN

## 2020-01-19 ENCOUNTER — Other Ambulatory Visit: Payer: Self-pay | Admitting: Family Medicine

## 2020-01-19 DIAGNOSIS — G894 Chronic pain syndrome: Secondary | ICD-10-CM

## 2020-01-19 DIAGNOSIS — G8929 Other chronic pain: Secondary | ICD-10-CM

## 2020-01-19 NOTE — Telephone Encounter (Signed)
Medication: HYDROcodone-acetaminophen (NORCO) 10-325 MG tablet    Wanted to get request in early due to upcoming Holiday   Has the patient contacted their pharmacy? No. (If no, request that the patient contact the pharmacy for the refill.) (If yes, when and what did the pharmacy advise?)  Preferred Pharmacy (with phone number or street name):    [place], [place] - [address] [place], [place] [address]  Phone:  [phone] Fax:  [phone]    Agent: Please be advised that RX refills may take up to 3 business days. We ask that you follow-up with your pharmacy.

## 2020-01-20 MED ORDER — HYDROCODONE-ACETAMINOPHEN 10-325 MG PO TABS: 1.0000 | ORAL_TABLET | Freq: Three times a day (TID) | ORAL | 0 refills | Status: DC | PRN

## 2020-01-20 NOTE — Telephone Encounter (Signed)
Requesting: NORCO Contract: 12/03/18 UDS: 12/03/18 Last OV: 12/15/19 Next OV: N/A Last Refill: 12/23/19,#60--0 RF Database:   Please advise

## 2020-02-19 ENCOUNTER — Telehealth: Payer: Self-pay

## 2020-02-19 ENCOUNTER — Other Ambulatory Visit: Payer: Self-pay | Admitting: Family Medicine

## 2020-02-19 DIAGNOSIS — G8929 Other chronic pain: Secondary | ICD-10-CM

## 2020-02-19 DIAGNOSIS — G894 Chronic pain syndrome: Secondary | ICD-10-CM

## 2020-02-19 MED ORDER — HYDROCODONE-ACETAMINOPHEN 10-325 MG PO TABS: 1.0000 | ORAL_TABLET | Freq: Three times a day (TID) | ORAL | 0 refills | Status: DC | PRN

## 2020-02-19 NOTE — Telephone Encounter (Signed)
Requesting: NORCO Contract: 12/03/18 UDS: 12/03/2018, low risk Last OV: 12/15/19 Next OV: N/A Last Refill: 01/20/2020 Database:   Please advise

## 2020-02-19 NOTE — Telephone Encounter (Signed)
Pt is needing another letter to extend her leave from work through the end of the school year (employed with [employer]).  Pt does not currently have the exact date, but wanted to go ahead and put the request in for the letter and will get the exact date to [place].  Pt is also needing a refill on hydrocodone.  Pharmacy is [facility] in [place].

## 2020-02-19 NOTE — Telephone Encounter (Signed)
I will send in the hydrocodone but make sure this gets to Dr Etter Sjogren upon return to fix the letter

## 2020-03-11 ENCOUNTER — Telehealth: Payer: Self-pay

## 2020-03-11 NOTE — Telephone Encounter (Signed)
Patient called in to see if Dr. Etter Sjogren or the nurse can send over a Letter to the patient job stating that she has been out of work due to her having Covid-19 from 12/22/2019 to 04/22/2020 Please send it to Fax # [phone]   Please follow up with the patient once this is done at [phone]

## 2020-03-12 ENCOUNTER — Encounter: Payer: Self-pay | Admitting: Family Medicine

## 2020-03-12 NOTE — Telephone Encounter (Signed)
Spoke w/ Pt- she is just needing a letter saying she is considered a high risk Pt.

## 2020-03-12 NOTE — Telephone Encounter (Signed)
Note is in mychart

## 2020-03-17 NOTE — Progress Notes (Deleted)
Subjective:   Emily Maddox is a 49 y.o. female who presents for Medicare Annual (Subsequent) preventive examination.  Review of Systems:    Sleep patterns: {SX; SLEEP PATTERNS:18802::"feels rested on waking","does not get up to void","gets up *** times nightly to void","sleeps *** hours nightly"}.   Home Safety/Smoke Alarms: Feels safe in home. Smoke alarms in place.     Female:   Pap-       Mammo-       Dexa scan-        CCS-    Objective:     Vitals: There were no vitals taken for this visit.  There is no height or weight on file to calculate BMI.  Advanced Directives 01/28/2019 03/06/2017 11/11/2015 10/11/2015  Does Patient Have a Medical Advance Directive? Yes No No No  Type of Paramedic of [place];Living will - - -  Does patient want to make changes to medical advance directive? No - Patient declined - - -  Copy of [place] in Chart? Yes - validated most recent copy scanned in chart (See row information) - - -  Would patient like information on creating a medical advance directive? - Yes (MAU/Ambulatory/Procedural Areas - Information given) No - patient declined information No - patient declined information    Tobacco Social History   Tobacco Use  Smoking Status Current Every Day Smoker  . Packs/day: 1.00  . Years: 19.00  . Pack years: 19.00  . Types: Cigarettes  Smokeless Tobacco Never Used  Tobacco Comment   couldn't tolerate chantix, cut down to 0.5 pack since 11/2013     Ready to quit: Not Answered Counseling given: Not Answered Comment: couldn't tolerate chantix, cut down to 0.5 pack since 11/2013   Clinical Intake:                       Past Medical History:  Diagnosis Date  . 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 Diabetes mellitus   . Hyperlipidemia   . Tachycardia    Past Surgical History:  Procedure Laterality Date  . BREAST EXCISIONAL  BIOPSY Left 2003   Family History  Problem Relation Age of Onset  . Cancer Mother 68       breast  . Hypertension Mother   . Hyperlipidemia Mother   . Diabetes Mother   . Breast cancer Mother 13  . Cancer Father 78       colon  . Hypertension Father   . Hyperlipidemia Father   . Heart disease Father        a fib  . Diabetes Father   . Diabetes Sister   . Breast cancer Other   . Colon cancer Other    Social History   Socioeconomic History  . Marital status: Single    Spouse name: Not on file  . Number of children: Not on file  . Years of education: Not on file  . Highest education level: Not on file  Occupational History    Employer: UNEMPLOYED  Tobacco Use  . Smoking status: Current Every Day Smoker    Packs/day: 1.00    Years: 19.00    Pack years: 19.00    Types: Cigarettes  . Smokeless tobacco: Never Used  . Tobacco comment: couldn't tolerate chantix, cut down to 0.5 pack since 11/2013  Substance and Sexual Activity  . Alcohol use: No  . Drug use: No  .  Sexual activity: Yes    Partners: Female  Other Topics Concern  . Not on file  Social History Narrative   Lives alone.     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 in business administration   Exercise--  3-4 x a week   Social Determinants of Health   Financial Resource Strain:   . Difficulty of Paying Living Expenses:   Food Insecurity:   . Worried About Charity fundraiser in the Last Year:   . Arboriculturist in the Last Year:   Transportation Needs:   . Film/video editor (Medical):   Marland Kitchen Lack of Transportation (Non-Medical):   Physical Activity:   . Days of Exercise per Week:   . Minutes of Exercise per Session:   Stress:   . Feeling of Stress :   Social Connections:   . Frequency of Communication with Friends and Family:   . Frequency of Social Gatherings with Friends and Family:   . Attends Religious Services:   . Active Member of Clubs or Organizations:   . Attends Archivist  Meetings:   Marland Kitchen Marital Status:     Outpatient Encounter Medications as of 03/18/2020  Medication Sig  . Blood Glucose Monitoring Suppl ([place]) w/Device KIT 1 each by Does not apply route daily. Dx code: E11.9  . glucose blood (ONETOUCH VERIO) test strip USE 1 STRIP TO CHECK GLUCOSE ONCE DAILY  . HYDROcodone-acetaminophen (NORCO) 10-325 MG tablet Take 1 tablet by mouth every 8 (eight) hours as needed.  Glory Rosebush DELICA LANCETS 66Y MISC Check sugar every day.  Dx Code: E11.9  . Probiotic Product (PROBIOTIC DAILY PO) Take 1 capsule by mouth daily.  . rosuvastatin (CRESTOR) 40 MG tablet Take 1 tablet (40 mg total) by mouth daily.   No facility-administered encounter medications on file as of 03/18/2020.    Activities of Daily Living No flowsheet data found.  Patient Care Team: Carollee Herter, Alferd Apa, DO as PCP - General Juanita Craver, MD as Consulting Physician (Gastroenterology) Linda Hedges, DO as Consulting Physician (Obstetrics and Gynecology)    Assessment:   This is a routine wellness examination for Emily Maddox.  Exercise Activities and Dietary recommendations    Goals    . Increase physical activity    . Quit smoking / using tobacco       Fall Risk Fall Risk  01/28/2019 03/06/2017 11/11/2015  Falls in the past year? 0 Yes Yes  Number falls in past yr: - 2 or more 2 or more  Injury with Fall? - No No  Risk Factor Category  - - High Fall Risk  Risk for fall due to : - Impaired balance/gait Impaired balance/gait;History of fall(s)  Risk for fall due to: Comment - - occasional leg weakness and numbness  Follow up - - Education provided;Falls prevention discussed   Is the patient's home free of loose throw rugs in walkways, pet beds, electrical cords, etc?   {Blank single:19197::"yes","no"}      Grab bars in the bathroom? {Blank single:19197::"yes","no"}      Handrails on the stairs?   {Blank single:19197::"yes","no"}      Adequate lighting?   {Blank  single:19197::"yes","no"}  Timed Get Up and Go performed: ***  Depression Screen PHQ 2/9 Scores 01/28/2019 11/14/2018 03/06/2017 08/29/2016  PHQ - 2 Score 0 0 0 0  PHQ- 9 Score - 4 - -     Cognitive Function        Immunization History  Administered Date(s) Administered  . Hep A / Hep B 02/03/2009, 04/05/2009, 09/03/2009, 10/31/2010  . PPD Test 08/08/2017  . Td 02/03/2009    Qualifies for Shingles Vaccine?***  Screening Tests Health Maintenance  Topic Date Due  . PNEUMOCOCCAL POLYSACCHARIDE VACCINE AGE 62-64 HIGH RISK  Never done  . COVID-19 Vaccine (1) Never done  . PAP SMEAR-Modifier  07/27/2017  . OPHTHALMOLOGY EXAM  11/23/2017  . FOOT EXAM  10/29/2018  . URINE MICROALBUMIN  10/29/2018  . TETANUS/TDAP  02/04/2019  . INFLUENZA VACCINE  05/23/2020  . HEMOGLOBIN A1C  06/03/2020  . MAMMOGRAM  09/03/2020  . COLONOSCOPY  01/02/2021  . HIV Screening  Completed    Cancer Screenings: Lung: Low Dose CT Chest recommended if Age 74-80 years, 30 pack-year currently smoking OR have quit w/in 15years. Patient {DOES NOT does:27190::"does not"} qualify. Breast:  Up to date on Mammogram? {Yes/No:30480221}   Up to date of Bone Density/Dexa? {Yes/No:30480221} Colorectal: ***  Additional Screenings: ***: Hepatitis C Screening:      Plan:   ***   I have personally reviewed and noted the following in the patient's chart:   . Medical and social history . Use of alcohol, tobacco or illicit drugs  . Current medications and supplements . Functional ability and status . Nutritional status . Physical activity . Advanced directives . List of other physicians . Hospitalizations, surgeries, and ER visits in previous 12 months . Vitals . Screenings to include cognitive, depression, and falls . Referrals and appointments  In addition, I have reviewed and discussed with patient certain preventive protocols, quality metrics, and best practice recommendations. A written personalized care  plan for preventive services as well as general preventive health recommendations were provided to patient.     Emily Maddox, [place]  03/17/2020

## 2020-03-18 ENCOUNTER — Ambulatory Visit: Payer: Medicare HMO | Admitting: *Deleted

## 2020-03-18 ENCOUNTER — Other Ambulatory Visit: Payer: Self-pay

## 2020-03-23 ENCOUNTER — Other Ambulatory Visit: Payer: Self-pay | Admitting: Family Medicine

## 2020-03-23 ENCOUNTER — Encounter: Payer: Self-pay | Admitting: Family Medicine

## 2020-03-23 ENCOUNTER — Other Ambulatory Visit: Payer: Self-pay

## 2020-03-23 ENCOUNTER — Telehealth (INDEPENDENT_AMBULATORY_CARE_PROVIDER_SITE_OTHER): Payer: Medicare HMO | Admitting: Family Medicine

## 2020-03-23 DIAGNOSIS — G894 Chronic pain syndrome: Secondary | ICD-10-CM

## 2020-03-23 DIAGNOSIS — G8929 Other chronic pain: Secondary | ICD-10-CM

## 2020-03-23 DIAGNOSIS — Z7189 Other specified counseling: Secondary | ICD-10-CM | POA: Insufficient documentation

## 2020-03-23 MED ORDER — HYDROCODONE-ACETAMINOPHEN 10-325 MG PO TABS: 1.0000 | ORAL_TABLET | Freq: Three times a day (TID) | ORAL | 0 refills | Status: DC | PRN

## 2020-03-23 NOTE — Progress Notes (Signed)
Virtual Visit via Video Note  I connected with [name] on 03/23/20 at  3:20 PM EDT by a video enabled telemedicine application and verified that I am speaking with the correct person using two identifiers.  Location: Patient: home alone Provider: office    I discussed the limitations of evaluation and management by telemedicine and the availability of in person appointments. The patient expressed understanding and agreed to proceed.  History of Present Illness: Pt is   Observations/Objective: There were no vitals filed for this visit. Pt is in Nad  Assessment and Plan: 1. Educated about COVID-19 virus infection Advised pt to get covid vacine  No other complaints    Follow Up Instructions:    I discussed the assessment and treatment plan with the patient. The patient was provided an opportunity to ask questions and all were answered. The patient agreed with the plan and demonstrated an understanding of the instructions.   The patient was advised to call back or seek an in-person evaluation if the symptoms worsen or if the condition fails to improve as anticipated.  I provided 25 minutes of non-face-to-face time during this encounter.   [name], DO

## 2020-03-23 NOTE — Telephone Encounter (Signed)
RequestingLebron Quam Contract: 12/03/2018 UDS: 12/03/2018. Low risk Last OV: 12/15/19 Next OV: 03/23/20--virtual visit Last Refill: 02/19/2020, #60--0 RF Database:   Please advise

## 2020-03-23 NOTE — Telephone Encounter (Signed)
Medication:  HYDROcodone-acetaminophen (NORCO) 10-325 MG tablet   Has the patient contacted their pharmacy? No. (If no, request that the patient contact the pharmacy for the refill.) (If yes, when and what did the pharmacy advise?)  Preferred Pharmacy (with phone number or street name):  [id number] - [place], [place] - [address]  [address], [place] [address]  Phone:  [phone] Fax:  [phone]  DEA #:  -- Agent: Please be advised that RX refills may take up to 3 business days. We ask that you follow-up with your pharmacy.  

## 2020-03-24 NOTE — Progress Notes (Signed)
I connected with [name] today by telephone and verified that I am speaking with the correct person using two identifiers. Location patient: home Location provider: work Persons participating in the virtual visit: patient, Marine scientist.    I discussed the limitations, risks, security and privacy concerns of performing an evaluation and management service by telephone and the availability of in person appointments. I also discussed with the patient that there may be a patient responsible charge related to this service. The patient expressed understanding and verbally consented to this telephonic visit.    Interactive audio and video telecommunications were attempted between this RN and patient, however failed, due to patient having technical difficulties OR patient did not have access to video capability.  We continued and completed visit with audio only.  Some vital signs may be absent or patient reported.    Subjective:   Emily Maddox is a 49 y.o. female who presents for Medicare Annual (Subsequent) preventive examination.  Review of Systems:  Home Safety/Smoke Alarms: Feels safe in home. Smoke alarms in place.  Lives alone in 1 story home.  Female:   Pap- pt states she will schedule     Mammo-   09/05/19    CCS-01/03/16. Recall 5 yrs.    Objective:     Vitals: BP 109/78 Comment: pt reported  Wt 162 lb (73.5 kg)   BMI 26.15 kg/m   Body mass index is 26.15 kg/m.  Advanced Directives 03/29/2020 01/28/2019 03/06/2017 11/11/2015 10/11/2015  Does Patient Have a Medical Advance Directive? Yes Yes No No No  Type of Paramedic of [place];Living will [place];Living will - - -  Does patient want to make changes to medical advance directive? No - Patient declined No - Patient declined - - -  Copy of [place] in Chart? No - copy requested Yes - validated most recent copy scanned in chart (See row information) - - -  Would patient  like information on creating a medical advance directive? - - Yes (MAU/Ambulatory/Procedural Areas - Information given) No - patient declined information No - patient declined information    Tobacco Social History   Tobacco Use  Smoking Status Current Every Day Smoker  . Packs/day: 1.00  . Years: 19.00  . Pack years: 19.00  . Types: Cigarettes  Smokeless Tobacco Never Used  Tobacco Comment   couldn't tolerate chantix, cut down to 0.5 pack since 11/2013     Ready to quit: No Counseling given: Yes Comment: couldn't tolerate chantix, cut down to 0.5 pack since 11/2013   Clinical Intake: Pain : No/denies pain     Past Medical History:  Diagnosis Date  . 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 Diabetes mellitus   . Hyperlipidemia   . Tachycardia    Past Surgical History:  Procedure Laterality Date  . BREAST EXCISIONAL BIOPSY Left 2003   Family History  Problem Relation Age of Onset  . Cancer Mother 33       breast  . Hypertension Mother   . Hyperlipidemia Mother   . Diabetes Mother   . Breast cancer Mother 95  . Cancer Father 34       colon  . Hypertension Father   . Hyperlipidemia Father   . Heart disease Father        a fib  . Diabetes Father   . Diabetes Sister   . Breast cancer Other   . Colon  cancer Other    Social History   Socioeconomic History  . Marital status: Single    Spouse name: Not on file  . Number of children: Not on file  . Years of education: Not on file  . Highest education level: Not on file  Occupational History    Employer: UNEMPLOYED  Tobacco Use  . Smoking status: Current Every Day Smoker    Packs/day: 1.00    Years: 19.00    Pack years: 19.00    Types: Cigarettes  . Smokeless tobacco: Never Used  . Tobacco comment: couldn't tolerate chantix, cut down to 0.5 pack since 11/2013  Substance and Sexual Activity  . Alcohol use: No  . Drug use: No  . Sexual activity: Yes     Partners: Female  Other Topics Concern  . Not on file  Social History Narrative   Lives alone.     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 in business administration   Exercise--  3-4 x a week   Social Determinants of Health   Financial Resource Strain: Low Risk   . Difficulty of Paying Living Expenses: Not hard at all  Food Insecurity: No Food Insecurity  . Worried About Charity fundraiser in the Last Year: Never true  . Ran Out of Food in the Last Year: Never true  Transportation Needs: No Transportation Needs  . Lack of Transportation (Medical): No  . Lack of Transportation (Non-Medical): No  Physical Activity:   . Days of Exercise per Week:   . Minutes of Exercise per Session:   Stress:   . Feeling of Stress :   Social Connections:   . Frequency of Communication with Friends and Family:   . Frequency of Social Gatherings with Friends and Family:   . Attends Religious Services:   . Active Member of Clubs or Organizations:   . Attends Archivist Meetings:   Marland Kitchen Marital Status:     Outpatient Encounter Medications as of 03/29/2020  Medication Sig  . Blood Glucose Monitoring Suppl ([place]) w/Device KIT 1 each by Does not apply route daily. Dx code: E11.9  . glucose blood (ONETOUCH VERIO) test strip USE 1 STRIP TO CHECK GLUCOSE ONCE DAILY  . HYDROcodone-acetaminophen (NORCO) 10-325 MG tablet Take 1 tablet by mouth every 8 (eight) hours as needed.  Glory Rosebush DELICA LANCETS 30Q MISC Check sugar every day.  Dx Code: E11.9  . Probiotic Product (PROBIOTIC DAILY PO) Take 1 capsule by mouth daily.  . rosuvastatin (CRESTOR) 40 MG tablet Take 1 tablet (40 mg total) by mouth daily. (Patient not taking: Reported on 03/29/2020)   No facility-administered encounter medications on file as of 03/29/2020.    Activities of Daily Living In your present state of health, do you have any difficulty performing the following activities: 03/29/2020  Hearing? N  Vision? N    Difficulty concentrating or making decisions? N  Walking or climbing stairs? N  Dressing or bathing? N  Doing errands, shopping? N  Preparing Food and eating ? N  Using the Toilet? N  In the past six months, have you accidently leaked urine? N  Do you have problems with loss of bowel control? N  Managing your Medications? N  Managing your Finances? N  Housekeeping or managing your Housekeeping? N  Some recent data might be hidden    Patient Care Team: Carollee Herter, Alferd Apa, DO as PCP - General Juanita Craver, MD as Consulting Physician (Gastroenterology) Linda Hedges, DO  as Consulting Physician (Obstetrics and Gynecology)    Assessment:   This is a routine wellness examination for Delanna. Physical assessment deferred to PCP.  Exercise Activities and Dietary recommendations Current Exercise Habits: Home exercise routine, Type of exercise: walking(bike), Time (Minutes): 30, Frequency (Times/Week): 3, Weekly Exercise (Minutes/Week): 90, Intensity: Mild, Exercise limited by: None identified   Diet (meal preparation, eat out, water intake, caffeinated beverages, dairy products, fruits and vegetables): 24 hr recall Breakfast: crackers and applesauce Lunch: pot pie Dinner:  Fried Geophysicist/field seismologist.    Goals    . Increase physical activity    . Quit smoking / using tobacco       Fall Risk Fall Risk  03/29/2020 01/28/2019 03/06/2017 11/11/2015  Falls in the past year? 0 0 Yes Yes  Number falls in past yr: 0 - 2 or more 2 or more  Injury with Fall? 0 - No No  Risk Factor Category  - - - High Fall Risk  Risk for fall due to : - - Impaired balance/gait Impaired balance/gait;History of fall(s)  Risk for fall due to: Comment - - - occasional leg weakness and numbness  Follow up Education provided;Falls prevention discussed - - Education provided;Falls prevention discussed   Depression Screen PHQ 2/9 Scores 03/29/2020 01/28/2019 11/14/2018 03/06/2017  PHQ - 2 Score 0 0 0 0  PHQ- 9 Score - - 4 -      Cognitive Function Ad8 score reviewed for issues:  Issues making decisions:no  Less interest in hobbies / activities:no  Repeats questions, stories (family complaining):no  Trouble using ordinary gadgets (microwave, computer, phone):no  Forgets the month or year: no  Mismanaging finances: no  Remembering appts:no  Daily problems with thinking and/or memory:no Ad8 score is=0       Immunization History  Administered Date(s) Administered  . Hep A / Hep B 02/03/2009, 04/05/2009, 09/03/2009, 10/31/2010  . PPD Test 08/08/2017  . Td 02/03/2009   Screening Tests Health Maintenance  Topic Date Due  . Hepatitis C Screening  Never done  . PNEUMOCOCCAL POLYSACCHARIDE VACCINE AGE 65-64 HIGH RISK  Never done  . COVID-19 Vaccine (1) Never done  . PAP SMEAR-Modifier  07/27/2017  . OPHTHALMOLOGY EXAM  11/23/2017  . FOOT EXAM  10/29/2018  . URINE MICROALBUMIN  10/29/2018  . TETANUS/TDAP  02/04/2019  . INFLUENZA VACCINE  05/23/2020  . HEMOGLOBIN A1C  06/03/2020  . MAMMOGRAM  09/03/2020  . COLONOSCOPY  01/02/2021  . HIV Screening  Completed      Plan:   See you next year!  Continue to eat heart healthy diet (full of fruits, vegetables, whole grains, lean protein, water--limit salt, fat, and sugar intake) and increase physical activity as tolerated.  Continue doing brain stimulating activities (puzzles, reading, adult coloring books, staying active) to keep memory sharp.   Bring a copy of your living will and/or healthcare power of attorney to your next office visit.   I have personally reviewed and noted the following in the patient's chart:   . Medical and social history . Use of alcohol, tobacco or illicit drugs  . Current medications and supplements . Functional ability and status . Nutritional status . Physical activity . Advanced directives . List of other physicians . Hospitalizations, surgeries, and ER visits in previous 12 months . Vitals . Screenings to  include cognitive, depression, and falls . Referrals and appointments  In addition, I have reviewed and discussed with patient certain preventive protocols, quality metrics, and best practice recommendations. A written  personalized care plan for preventive services as well as general preventive health recommendations were provided to patient.     Shela Nevin, [place]  03/29/2020

## 2020-03-26 ENCOUNTER — Ambulatory Visit: Payer: Medicare HMO | Admitting: *Deleted

## 2020-03-29 ENCOUNTER — Encounter: Payer: Self-pay | Admitting: *Deleted

## 2020-03-29 ENCOUNTER — Other Ambulatory Visit: Payer: Self-pay

## 2020-03-29 ENCOUNTER — Ambulatory Visit (INDEPENDENT_AMBULATORY_CARE_PROVIDER_SITE_OTHER): Payer: Medicare HMO | Admitting: *Deleted

## 2020-03-29 VITALS — BP 109/78 | Wt 162.0 lb

## 2020-03-29 DIAGNOSIS — Z0000 Encounter for general adult medical examination without abnormal findings: Secondary | ICD-10-CM | POA: Diagnosis not present

## 2020-03-29 NOTE — Patient Instructions (Addendum)
See you next year!  Continue to eat heart healthy diet (full of fruits, vegetables, whole grains, lean protein, water--limit salt, fat, and sugar intake) and increase physical activity as tolerated.  Continue doing brain stimulating activities (puzzles, reading, adult coloring books, staying active) to keep memory sharp.   Bring a copy of your living will and/or healthcare power of attorney to your next office visit.   Emily Maddox , Thank you for taking time to come for your Medicare Wellness Visit. I appreciate your ongoing commitment to your health goals. Please review the following plan we discussed and let me know if I can assist you in the future.   These are the goals we discussed: Goals    . Increase physical activity    . Quit smoking / using tobacco       This is a list of the screening recommended for you and due dates:  Health Maintenance  Topic Date Due  .  Hepatitis C: One time screening is recommended by Center for Disease Control  (CDC) for  adults born from [birth year] through 1964.   Never done  . Pneumococcal vaccine  Never done  . COVID-19 Vaccine (1) Never done  . Pap Smear  07/27/2017  . Eye exam for diabetics  11/23/2017  . Complete foot exam   10/29/2018  . Urine Protein Check  10/29/2018  . Tetanus Vaccine  02/04/2019  . Flu Shot  05/23/2020  . Hemoglobin A1C  06/03/2020  . Mammogram  09/03/2020  . Colon Cancer Screening  01/02/2021  . HIV Screening  Completed    Preventive Care 35-42 Years Old, Female Preventive care refers to visits with your health care provider and lifestyle choices that can promote health and wellness. This includes:  A yearly physical exam. This may also be called an annual well check.  Regular dental visits and eye exams.  Immunizations.  Screening for certain conditions.  Healthy lifestyle choices, such as eating a healthy diet, getting regular exercise, not using drugs or products that contain nicotine and tobacco, and  limiting alcohol use. What can I expect for my preventive care visit? Physical exam Your health care provider will check your:  Height and weight. This may be used to calculate body mass index (BMI), which tells if you are at a healthy weight.  Heart rate and blood pressure.  Skin for abnormal spots. Counseling Your health care provider may ask you questions about your:  Alcohol, tobacco, and drug use.  Emotional well-being.  Home and relationship well-being.  Sexual activity.  Eating habits.  Work and work Statistician.  Method of birth control.  Menstrual cycle.  Pregnancy history. What immunizations do I need?  Influenza (flu) vaccine  This is recommended every year. Tetanus, diphtheria, and pertussis (Tdap) vaccine  You may need a Td booster every 10 years. Varicella (chickenpox) vaccine  You may need this if you have not been vaccinated. Zoster (shingles) vaccine  You may need this after age 58. Measles, mumps, and rubella (MMR) vaccine  You may need at least one dose of MMR if you were born in 1957 or later. You may also need a second dose. Pneumococcal conjugate (PCV13) vaccine  You may need this if you have certain conditions and were not previously vaccinated. Pneumococcal polysaccharide (PPSV23) vaccine  You may need one or two doses if you smoke cigarettes or if you have certain conditions. Meningococcal conjugate (MenACWY) vaccine  You may need this if you have certain conditions. Hepatitis  A vaccine  You may need this if you have certain conditions or if you travel or work in places where you may be exposed to hepatitis A. Hepatitis B vaccine  You may need this if you have certain conditions or if you travel or work in places where you may be exposed to hepatitis B. Haemophilus influenzae type b (Hib) vaccine  You may need this if you have certain conditions. Human papillomavirus (HPV) vaccine  If recommended by your health care provider,  you may need three doses over 6 months. You may receive vaccines as individual doses or as more than one vaccine together in one shot (combination vaccines). Talk with your health care provider about the risks and benefits of combination vaccines. What tests do I need? Blood tests  Lipid and cholesterol levels. These may be checked every 5 years, or more frequently if you are over 64 years old.  Hepatitis C test.  Hepatitis B test. Screening  Lung cancer screening. You may have this screening every year starting at age 15 if you have a 30-pack-year history of smoking and currently smoke or have quit within the past 15 years.  Colorectal cancer screening. All adults should have this screening starting at age 49 and continuing until age 45. Your health care provider may recommend screening at age 46 if you are at increased risk. You will have tests every 1-10 years, depending on your results and the type of screening test.  Diabetes screening. This is done by checking your blood sugar (glucose) after you have not eaten for a while (fasting). You may have this done every 1-3 years.  Mammogram. This may be done every 1-2 years. Talk with your health care provider about when you should start having regular mammograms. This may depend on whether you have a family history of breast cancer.  BRCA-related cancer screening. This may be done if you have a family history of breast, ovarian, tubal, or peritoneal cancers.  Pelvic exam and Pap test. This may be done every 3 years starting at age 67. Starting at age 51, this may be done every 5 years if you have a Pap test in combination with an HPV test. Other tests  Sexually transmitted disease (STD) testing.  Bone density scan. This is done to screen for osteoporosis. You may have this scan if you are at high risk for osteoporosis. Follow these instructions at home: Eating and drinking  Eat a diet that includes fresh fruits and vegetables, whole  grains, lean protein, and low-fat dairy.  Take vitamin and mineral supplements as recommended by your health care provider.  Do not drink alcohol if: ? Your health care provider tells you not to drink. ? You are pregnant, may be pregnant, or are planning to become pregnant.  If you drink alcohol: ? Limit how much you have to 0-1 drink a day. ? Be aware of how much alcohol is in your drink. In the [place] one drink equals one 12 oz bottle of beer (355 mL), one 5 oz glass of wine (148 mL), or one 1 oz glass of hard liquor (44 mL). Lifestyle  Take daily care of your teeth and gums.  Stay active. Exercise for at least 30 minutes on 5 or more days each week.  Do not use any products that contain nicotine or tobacco, such as cigarettes, e-cigarettes, and chewing tobacco. If you need help quitting, ask your health care provider.  If you are sexually active, practice safe sex. Use  a condom or other form of birth control (contraception) in order to prevent pregnancy and STIs (sexually transmitted infections).  If told by your health care provider, take low-dose aspirin daily starting at age 52. What's next?  Visit your health care provider once a year for a well check visit.  Ask your health care provider how often you should have your eyes and teeth checked.  Stay up to date on all vaccines. This information is not intended to replace advice given to you by your health care provider. Make sure you discuss any questions you have with your health care provider. Document Revised: 06/20/2018 Document Reviewed: 06/20/2018 Elsevier Patient Education  2020 [employer].

## 2020-04-20 ENCOUNTER — Other Ambulatory Visit: Payer: Self-pay | Admitting: Family Medicine

## 2020-04-20 DIAGNOSIS — G8929 Other chronic pain: Secondary | ICD-10-CM

## 2020-04-20 DIAGNOSIS — G894 Chronic pain syndrome: Secondary | ICD-10-CM

## 2020-04-20 MED ORDER — HYDROCODONE-ACETAMINOPHEN 10-325 MG PO TABS: 1.0000 | ORAL_TABLET | Freq: Three times a day (TID) | ORAL | 0 refills | Status: DC | PRN

## 2020-04-20 NOTE — Telephone Encounter (Signed)
Requesting: NORCO Contract: 10/29/18 UDS: 12/03/2018, low risk Last OV: 03/23/20 Next OV: 05/31/2020 Last Refill: 03/23/2020, #60--0 RF Database:   Please advise

## 2020-04-20 NOTE — Telephone Encounter (Signed)
Medication:  HYDROcodone-acetaminophen (NORCO) 10-325 MG tablet   Has the patient contacted their pharmacy? No. (If no, request that the patient contact the pharmacy for the refill.) (If yes, when and what did the pharmacy advise?)  Preferred Pharmacy (with phone number or street name):  [facility] - [place], [place] - [address]  [address], [place] [address]  Phone:  [phone] Fax:  [phone]  DEA #:  -- Agent: Please be advised that RX refills may take up to 3 business days. We ask that you follow-up with your pharmacy.  

## 2020-04-20 NOTE — Telephone Encounter (Signed)
I'll refill but we need contract and uds

## 2020-05-09 ENCOUNTER — Encounter: Payer: Self-pay | Admitting: Family Medicine

## 2020-05-10 ENCOUNTER — Encounter: Payer: Self-pay | Admitting: Family Medicine

## 2020-05-10 NOTE — Telephone Encounter (Signed)
Note in my chart 

## 2020-05-10 NOTE — Telephone Encounter (Signed)
Okay to write?   Please advise.  

## 2020-05-18 ENCOUNTER — Encounter: Payer: Self-pay | Admitting: Family Medicine

## 2020-05-18 DIAGNOSIS — G894 Chronic pain syndrome: Secondary | ICD-10-CM

## 2020-05-18 DIAGNOSIS — G8929 Other chronic pain: Secondary | ICD-10-CM

## 2020-05-18 MED ORDER — HYDROCODONE-ACETAMINOPHEN 10-325 MG PO TABS: 1.0000 | ORAL_TABLET | Freq: Three times a day (TID) | ORAL | 0 refills | Status: DC | PRN

## 2020-05-18 NOTE — Telephone Encounter (Signed)
Request for: Hydrocodone-acetaminophen 10-325mg .  Last refill: 04/20/20, #60, No refills.  Last OV: 03/23/2020 Last contract: 10/29/2018 Last UDS: 12/03/2018 Next OV: 05/31/2020 for 6 mth f/u, added "contract/uds" to appt note.

## 2020-05-20 ENCOUNTER — Other Ambulatory Visit: Payer: Self-pay | Admitting: Family Medicine

## 2020-05-20 DIAGNOSIS — E1149 Type 2 diabetes mellitus with other diabetic neurological complication: Secondary | ICD-10-CM

## 2020-05-20 DIAGNOSIS — R69 Illness, unspecified: Secondary | ICD-10-CM | POA: Diagnosis not present

## 2020-05-31 ENCOUNTER — Ambulatory Visit (INDEPENDENT_AMBULATORY_CARE_PROVIDER_SITE_OTHER): Payer: Medicare HMO | Admitting: Family Medicine

## 2020-05-31 ENCOUNTER — Other Ambulatory Visit: Payer: Self-pay

## 2020-05-31 ENCOUNTER — Encounter: Payer: Self-pay | Admitting: Family Medicine

## 2020-05-31 VITALS — BP 131/88 | HR 117 | Temp 98.6°F | Resp 18 | Ht 66.0 in | Wt 168.2 lb

## 2020-05-31 DIAGNOSIS — E785 Hyperlipidemia, unspecified: Secondary | ICD-10-CM

## 2020-05-31 DIAGNOSIS — Z79899 Other long term (current) drug therapy: Secondary | ICD-10-CM | POA: Diagnosis not present

## 2020-05-31 DIAGNOSIS — G894 Chronic pain syndrome: Secondary | ICD-10-CM

## 2020-05-31 DIAGNOSIS — E1165 Type 2 diabetes mellitus with hyperglycemia: Secondary | ICD-10-CM | POA: Insufficient documentation

## 2020-05-31 DIAGNOSIS — E1169 Type 2 diabetes mellitus with other specified complication: Secondary | ICD-10-CM | POA: Diagnosis not present

## 2020-05-31 LAB — COMPREHENSIVE METABOLIC PANEL
ALT: 8 U/L (ref 0–35)
AST: 14 U/L (ref 0–37)
Albumin: 4.1 g/dL (ref 3.5–5.2)
Alkaline Phosphatase: 46 U/L (ref 39–117)
BUN: 9 mg/dL (ref 6–23)
CO2: 25 mEq/L (ref 19–32)
Calcium: 9.2 mg/dL (ref 8.4–10.5)
Chloride: 105 mEq/L (ref 96–112)
Creatinine, Ser: 0.92 mg/dL (ref 0.40–1.20)
GFR: 78.91 mL/min (ref >60.00)
Glucose, Bld: 90 mg/dL (ref 70–99)
Potassium: 4.2 mEq/L (ref 3.5–5.1)
Sodium: 137 mEq/L (ref 135–145)
Total Bilirubin: 0.2 mg/dL (ref 0.2–1.2)
Total Protein: 7.4 g/dL (ref 6.0–8.3)

## 2020-05-31 LAB — LIPID PANEL
Cholesterol: 236 mg/dL — ABNORMAL HIGH (ref 0–200)
HDL: 58.6 mg/dL (ref >39.00)
LDL Cholesterol: 155 mg/dL — ABNORMAL HIGH (ref 0–99)
NonHDL: 176.95
Total CHOL/HDL Ratio: 4
Triglycerides: 111 mg/dL (ref 0.0–149.0)
VLDL: 22.2 mg/dL (ref 0.0–40.0)

## 2020-05-31 LAB — HEMOGLOBIN A1C: Hgb A1c MFr Bld: 6 % (ref 4.6–6.5)

## 2020-05-31 NOTE — Progress Notes (Signed)
Patient ID: Emily Maddox, female    DOB: 05/10/1971  Age: 49 y.o. MRN: [id number]    Subjective:  Subjective  HPI Emily Maddox presents for f/u dm and cholesterol.  She stopped the cholesterol med.    Review of Systems  Constitutional: Negative for appetite change, diaphoresis, fatigue and unexpected weight change.  Eyes: Negative for pain, redness and visual disturbance.  Respiratory: Negative for cough, chest tightness, shortness of breath and wheezing.   Cardiovascular: Negative for chest pain, palpitations and leg swelling.  Endocrine: Negative for cold intolerance, heat intolerance, polydipsia, polyphagia and polyuria.  Genitourinary: Positive for frequency. Negative for difficulty urinating and dysuria.  Neurological: Negative for dizziness, light-headedness, numbness and headaches.    History Past Medical History:  Diagnosis Date  . 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 Diabetes mellitus   . Hyperlipidemia   . Tachycardia     She has a past surgical history that includes Breast excisional biopsy (Left, 2003).   Her family history includes Breast cancer in an other family member; Breast cancer (age of onset: 66) in her mother; Cancer (age of onset: 21) in her father; Cancer (age of onset: 51) in her mother; Colon cancer in an other family member; Diabetes in her father, mother, and sister; Heart disease in her father; Hyperlipidemia in her father and mother; Hypertension in her father and mother.She reports that she has been smoking cigarettes. She has a 19.00 pack-year smoking history. She has never used smokeless tobacco. She reports that she does not drink alcohol and does not use drugs.  Current Outpatient Medications on File Prior to Visit  Medication Sig Dispense Refill  . Blood Glucose Monitoring Suppl (ONETOUCH VERIO FLEX SYSTEM) w/Device KIT 1 each by Does not apply route daily. Dx code: E11.9 1 kit 0  . glucose  blood (ONETOUCH VERIO) test strip USE 1 STRIP TO CHECK GLUCOSE ONCE DAILY 100 each 12  . HYDROcodone-acetaminophen (NORCO) 10-325 MG tablet Take 1 tablet by mouth every 8 (eight) hours as needed. 60 tablet 0  . ONETOUCH DELICA LANCETS 33G MISC Check sugar every day.  Dx Code: E11.9 100 each 1  . Probiotic Product (PROBIOTIC DAILY PO) Take 1 capsule by mouth daily.    . rosuvastatin (CRESTOR) 40 MG tablet Take 1 tablet (40 mg total) by mouth daily. (Patient not taking: Reported on 03/29/2020) 90 tablet 1   No current facility-administered medications on file prior to visit.     Objective:  Objective  Physical Exam Vitals and nursing note reviewed.  Constitutional:      Appearance: She is well-developed.  HENT:     Head: Normocephalic and atraumatic.  Eyes:     Conjunctiva/sclera: Conjunctivae normal.  Neck:     Thyroid: No thyromegaly.     Vascular: No carotid bruit or JVD.  Cardiovascular:     Rate and Rhythm: Normal rate and regular rhythm.     Heart sounds: Normal heart sounds. No murmur heard.   Pulmonary:     Effort: Pulmonary effort is normal. No respiratory distress.     Breath sounds: Normal breath sounds. No wheezing or rales.  Chest:     Chest wall: No tenderness.  Musculoskeletal:     Cervical back: Normal range of motion and neck supple.  Neurological:     Mental Status: She is alert and oriented to person, place, and time.    BP 131/88 (BP Location: Right Arm, Patient Position: Sitting,  Cuff Size: Normal)   Pulse (!) 117   Temp 98.6 F (37 C) (Oral)   Resp 18   Ht '5\' 6"'$  (1.676 m)   Wt 168 lb 3.2 oz (76.3 kg)   SpO2 96%   BMI 27.15 kg/m  Wt Readings from Last 3 Encounters:  05/31/20 168 lb 3.2 oz (76.3 kg)  03/29/20 162 lb (73.5 kg)  12/15/19 160 lb (72.6 kg)     Lab Results  Component Value Date   WBC 8.1 12/03/2018   HGB 12.9 12/03/2018   HCT 38.4 12/03/2018   PLT 222.0 12/03/2018   GLUCOSE 89 12/05/2019   CHOL 216 (H) 12/05/2019   TRIG 65.0  12/05/2019   HDL 67.40 12/05/2019   LDLDIRECT 143.7 11/12/2013   LDLCALC 136 (H) 12/05/2019   ALT 8 12/05/2019   AST 12 12/05/2019   NA 138 12/05/2019   K 4.6 12/05/2019   CL 104 12/05/2019   CREATININE 0.92 12/05/2019   BUN 13 12/05/2019   CO2 27 12/05/2019   TSH 0.95 04/09/2018   HGBA1C 6.0 12/05/2019   MICROALBUR <0.7 10/29/2017    MM 3D SCREEN BREAST BILATERAL  Result Date: 09/05/2019 CLINICAL DATA:  Screening. EXAM: DIGITAL SCREENING BILATERAL MAMMOGRAM WITH TOMO AND CAD COMPARISON:  Previous exam(s). ACR Breast Density Category b: There are scattered areas of fibroglandular density. FINDINGS: There are no findings suspicious for malignancy. Images were processed with CAD. IMPRESSION: No mammographic evidence of malignancy. A result letter of this screening mammogram will be mailed directly to the patient. RECOMMENDATION: Screening mammogram in one year. (Code:SM-B-01Y) BI-RADS CATEGORY  1: Negative. Electronically Signed   By: Nolon Nations M.D.   On: 09/05/2019 14:56     Assessment & Plan:  Plan  I am having Emily Maddox maintain her Probiotic Product (PROBIOTIC DAILY PO), OneTouch Verio Flex System, OneTouch Delica Lancets 25D, rosuvastatin, HYDROcodone-acetaminophen, and OneTouch Verio.  No orders of the defined types were placed in this encounter.   Problem List Items Addressed This Visit      Unprioritized   Chronic pain syndrome    uds and contract updated  con't meds       Hyperlipidemia    Encouraged heart healthy diet, increase exercise, avoid trans fats, consider a krill oil cap daily Will recheck labs and restart statin       Hyperlipidemia associated with type 2 diabetes mellitus ([place])   Relevant Orders   Lipid panel   Comprehensive metabolic panel   Uncontrolled type 2 diabetes mellitus with hyperglycemia ([facility]) - Primary   Relevant Orders   Comprehensive metabolic panel   Hemoglobin A1c      Follow-up: Return in about 3 months (around  08/31/2020), or if symptoms worsen or fail to improve, for hyperlipidemia, diabetes II, annual exam, fasting.  [name], DO

## 2020-05-31 NOTE — Assessment & Plan Note (Signed)
uds and contract updated  con't meds

## 2020-05-31 NOTE — Patient Instructions (Signed)
Carbohydrate Counting for Diabetes Mellitus, Adult  Carbohydrate counting is a method of keeping track of how many carbohydrates you eat. Eating carbohydrates naturally increases the amount of sugar (glucose) in the blood. Counting how many carbohydrates you eat helps keep your blood glucose within normal limits, which helps you manage your diabetes (diabetes mellitus). It is important to know how many carbohydrates you can safely have in each meal. This is different for every person. A diet and nutrition specialist (registered dietitian) can help you make a meal plan and calculate how many carbohydrates you should have at each meal and snack. Carbohydrates are found in the following foods:  Grains, such as breads and cereals.  Dried beans and soy products.  Starchy vegetables, such as potatoes, peas, and corn.  Fruit and fruit juices.  Milk and yogurt.  Sweets and snack foods, such as cake, cookies, candy, chips, and soft drinks. How do I count carbohydrates? There are two ways to count carbohydrates in food. You can use either of the methods or a combination of both. Reading "Nutrition Facts" on packaged food The "Nutrition Facts" list is included on the labels of almost all packaged foods and beverages in the [place] It includes:  The serving size.  Information about nutrients in each serving, including the grams (g) of carbohydrate per serving. To use the "Nutrition Facts":  Decide how many servings you will have.  Multiply the number of servings by the number of carbohydrates per serving.  The resulting number is the total amount of carbohydrates that you will be having. Learning standard serving sizes of other foods When you eat carbohydrate foods that are not packaged or do not include "Nutrition Facts" on the label, you need to measure the servings in order to count the amount of carbohydrates:  Measure the foods that you will eat with a food scale or measuring cup, if  needed.  Decide how many standard-size servings you will eat.  Multiply the number of servings by 15. Most carbohydrate-rich foods have about 15 g of carbohydrates per serving. ? For example, if you eat 8 oz (170 g) of strawberries, you will have eaten 2 servings and 30 g of carbohydrates (2 servings x 15 g = 30 g).  For foods that have more than one food mixed, such as soups and casseroles, you must count the carbohydrates in each food that is included. The following list contains standard serving sizes of common carbohydrate-rich foods. Each of these servings has about 15 g of carbohydrates:   hamburger bun or  English muffin.   oz (15 mL) syrup.   oz (14 g) jelly.  1 slice of bread.  1 six-inch tortilla.  3 oz (85 g) cooked rice or pasta.  4 oz (113 g) cooked dried beans.  4 oz (113 g) starchy vegetable, such as peas, corn, or potatoes.  4 oz (113 g) hot cereal.  4 oz (113 g) mashed potatoes or  of a large baked potato.  4 oz (113 g) canned or frozen fruit.  4 oz (120 mL) fruit juice.  4-6 crackers.  6 chicken nuggets.  6 oz (170 g) unsweetened dry cereal.  6 oz (170 g) plain fat-free yogurt or yogurt sweetened with artificial sweeteners.  8 oz (240 mL) milk.  8 oz (170 g) fresh fruit or one small piece of fruit.  24 oz (680 g) popped popcorn. Example of carbohydrate counting Sample meal  3 oz (85 g) chicken breast.  6 oz (170 g)   brown rice.  4 oz (113 g) corn.  8 oz (240 mL) milk.  8 oz (170 g) strawberries with sugar-free whipped topping. Carbohydrate calculation 1. Identify the foods that contain carbohydrates: ? Rice. ? Corn. ? Milk. ? Strawberries. 2. Calculate how many servings you have of each food: ? 2 servings rice. ? 1 serving corn. ? 1 serving milk. ? 1 serving strawberries. 3. Multiply each number of servings by 15 g: ? 2 servings rice x 15 g = 30 g. ? 1 serving corn x 15 g = 15 g. ? 1 serving milk x 15 g = 15 g. ? 1  serving strawberries x 15 g = 15 g. 4. Add together all of the amounts to find the total grams of carbohydrates eaten: ? 30 g + 15 g + 15 g + 15 g = 75 g of carbohydrates total. Summary  Carbohydrate counting is a method of keeping track of how many carbohydrates you eat.  Eating carbohydrates naturally increases the amount of sugar (glucose) in the blood.  Counting how many carbohydrates you eat helps keep your blood glucose within normal limits, which helps you manage your diabetes.  A diet and nutrition specialist (registered dietitian) can help you make a meal plan and calculate how many carbohydrates you should have at each meal and snack. This information is not intended to replace advice given to you by your health care provider. Make sure you discuss any questions you have with your health care provider. Document Revised: 05/03/2017 Document Reviewed: 03/22/2016 Elsevier Patient Education  2020 Elsevier Inc.  

## 2020-05-31 NOTE — Addendum Note (Signed)
Addended by: Sanda Linger on: 05/31/2020 03:38 PM   Modules accepted: Orders

## 2020-05-31 NOTE — Assessment & Plan Note (Signed)
Encouraged heart healthy diet, increase exercise, avoid trans fats, consider a krill oil cap daily Will recheck labs and restart statin

## 2020-06-01 DIAGNOSIS — R69 Illness, unspecified: Secondary | ICD-10-CM | POA: Diagnosis not present

## 2020-06-02 LAB — DRUG MONITORING, PANEL 8 WITH CONFIRMATION, URINE
6 Acetylmorphine: NEGATIVE ng/mL (ref <10)
Alcohol Metabolites: NEGATIVE ng/mL
Amphetamines: NEGATIVE ng/mL (ref <500)
Benzodiazepines: NEGATIVE ng/mL (ref <100)
Buprenorphine, Urine: NEGATIVE ng/mL (ref <5)
Cocaine Metabolite: NEGATIVE ng/mL (ref <150)
Codeine: NEGATIVE ng/mL (ref <50)
Creatinine: 163.4 mg/dL
Hydrocodone: 379 ng/mL — ABNORMAL HIGH (ref <50)
Hydromorphone: 228 ng/mL — ABNORMAL HIGH (ref <50)
MDMA: NEGATIVE ng/mL (ref <500)
Marijuana Metabolite: NEGATIVE ng/mL (ref <20)
Morphine: NEGATIVE ng/mL (ref <50)
Norhydrocodone: 1542 ng/mL — ABNORMAL HIGH (ref <50)
Opiates: POSITIVE ng/mL — AB (ref <100)
Oxidant: NEGATIVE ug/mL
Oxycodone: NEGATIVE ng/mL (ref <100)
pH: 5.7 (ref 4.5–9.0)

## 2020-06-02 LAB — DM TEMPLATE

## 2020-06-14 ENCOUNTER — Other Ambulatory Visit: Payer: Self-pay | Admitting: Family Medicine

## 2020-06-17 ENCOUNTER — Other Ambulatory Visit: Payer: Self-pay | Admitting: Family Medicine

## 2020-06-17 DIAGNOSIS — G8929 Other chronic pain: Secondary | ICD-10-CM

## 2020-06-17 DIAGNOSIS — G894 Chronic pain syndrome: Secondary | ICD-10-CM

## 2020-06-17 MED ORDER — HYDROCODONE-ACETAMINOPHEN 10-325 MG PO TABS: 1.0000 | ORAL_TABLET | Freq: Three times a day (TID) | ORAL | 0 refills | Status: DC | PRN

## 2020-06-17 NOTE — Telephone Encounter (Signed)
New Message:    1.Medication Requested: HYDROcodone-acetaminophen (NORCO) 10-325 MG tablet 2. Pharmacy (Name, Street, [place]): [facility] [place] [address] 3. On Med List: yes  4. Last Visit with PCP:   5. Next visit date with PCP:   Agent: Please be advised that RX refills may take up to 3 business days. We ask that you follow-up with your pharmacy.

## 2020-06-17 NOTE — Telephone Encounter (Signed)
Requesting: NORCO Contract: 05/31/2020 UDS: 05/31/2020 Last OV: 05/31/2020 Next OV: N/A Last Refill: 05/18/2020, #60--0 RF Database:   Please advise

## 2020-07-16 ENCOUNTER — Other Ambulatory Visit: Payer: Self-pay | Admitting: Family Medicine

## 2020-07-16 ENCOUNTER — Encounter: Payer: Self-pay | Admitting: Family Medicine

## 2020-07-16 DIAGNOSIS — G894 Chronic pain syndrome: Secondary | ICD-10-CM

## 2020-07-16 DIAGNOSIS — G8929 Other chronic pain: Secondary | ICD-10-CM

## 2020-07-16 MED ORDER — HYDROCODONE-ACETAMINOPHEN 10-325 MG PO TABS: 1.0000 | ORAL_TABLET | Freq: Three times a day (TID) | ORAL | 0 refills | Status: DC | PRN

## 2020-07-16 NOTE — Telephone Encounter (Signed)
Refill sent.

## 2020-07-16 NOTE — Telephone Encounter (Signed)
Hydrocodone refill.   Last OV: 05/31/2020 Last Fill: 06/17/2020 #60 and 0RF Pt sig: 1 tab q8h prn UDS: 05/31/2020 Low risk

## 2020-08-11 ENCOUNTER — Encounter: Payer: Self-pay | Admitting: Family Medicine

## 2020-08-11 DIAGNOSIS — G894 Chronic pain syndrome: Secondary | ICD-10-CM

## 2020-08-11 DIAGNOSIS — G8929 Other chronic pain: Secondary | ICD-10-CM

## 2020-08-11 MED ORDER — HYDROCODONE-ACETAMINOPHEN 10-325 MG PO TABS: 1.0000 | ORAL_TABLET | Freq: Three times a day (TID) | ORAL | 0 refills | Status: DC | PRN

## 2020-08-11 NOTE — Telephone Encounter (Signed)
**  Request is early, see Mychart message**   Requesting: [place]: 12/03/18 UDS: 12/03/2018, low risk Last OV: 05/31/20 Next OV: N/A Last Refill: 07/16/2020, #60--0 RF Database:   Please advise

## 2020-09-06 DIAGNOSIS — D259 Leiomyoma of uterus, unspecified: Secondary | ICD-10-CM | POA: Diagnosis not present

## 2020-09-10 ENCOUNTER — Other Ambulatory Visit: Payer: Self-pay | Admitting: Family Medicine

## 2020-09-10 DIAGNOSIS — G8929 Other chronic pain: Secondary | ICD-10-CM

## 2020-09-10 DIAGNOSIS — G894 Chronic pain syndrome: Secondary | ICD-10-CM

## 2020-09-10 NOTE — Telephone Encounter (Signed)
Medication: HYDROcodone-acetaminophen (NORCO) 10-325 MG tablet [[id number]]   Has the patient contacted their pharmacy? No. (If no, request that the patient contact the pharmacy for the refill.) (If yes, when and what did the pharmacy advise?)  Preferred Pharmacy (with phone number or street name): [facility] [address] [place] - [address] [place], [place] [address]  Phone:  [phone] Fax:  [phone]  DEA #:  --  Agent: Please be advised that RX refills may take up to 3 business days. We ask that you follow-up with your pharmacy.

## 2020-09-13 MED ORDER — HYDROCODONE-ACETAMINOPHEN 10-325 MG PO TABS: 1.0000 | ORAL_TABLET | Freq: Three times a day (TID) | ORAL | 0 refills | Status: DC | PRN

## 2020-09-13 NOTE — Telephone Encounter (Signed)
Requesting: NORCO Contract:  05/31/20 UDS: 05/31/2020 Last OV: 05/31/2020 Next OV: N/A Last Refill:  08/11/2020, #60--0 RF Database:   Please advise

## 2020-09-14 ENCOUNTER — Ambulatory Visit: Payer: Medicare HMO | Admitting: Family Medicine

## 2020-10-12 ENCOUNTER — Other Ambulatory Visit: Payer: Self-pay | Admitting: Family Medicine

## 2020-10-12 DIAGNOSIS — G894 Chronic pain syndrome: Secondary | ICD-10-CM

## 2020-10-12 DIAGNOSIS — G8929 Other chronic pain: Secondary | ICD-10-CM

## 2020-10-12 MED ORDER — HYDROCODONE-ACETAMINOPHEN 10-325 MG PO TABS: 1.0000 | ORAL_TABLET | Freq: Three times a day (TID) | ORAL | 0 refills | Status: DC | PRN

## 2020-10-12 NOTE — Telephone Encounter (Signed)
Medication: HYDROcodone-acetaminophen ([place]) 10-325 MG tablet [[id number]]   Has the patient contacted their pharmacy? No. (If no, request that the patient contact the pharmacy for the refill.) (If yes, when and what did the pharmacy advise?)  Preferred Pharmacy (with phone number or street name):  [facility], [place], [place] [address]  Phone:  [phone] Fax:  [phone]  DEA #:  --  DAW Reason: --   Agent: Please be advised that RX refills may take up to 3 business days. We ask that you follow-up with your pharmacy.

## 2020-10-12 NOTE — Telephone Encounter (Signed)
Requesting: NORCO Contract: 05/31/20 UDS: 05/31/20 Last OV: 05/31/2020 Next OV: 10/27/2020 Last Refill: 09/13/20, #60--0 RF Database:   Please advise

## 2020-10-27 ENCOUNTER — Telehealth (INDEPENDENT_AMBULATORY_CARE_PROVIDER_SITE_OTHER): Payer: Medicare HMO | Admitting: Family Medicine

## 2020-10-27 ENCOUNTER — Encounter: Payer: Self-pay | Admitting: Family Medicine

## 2020-10-27 ENCOUNTER — Other Ambulatory Visit: Payer: Self-pay

## 2020-10-27 VITALS — BP 114/80 | Ht 67.0 in | Wt 170.0 lb

## 2020-10-27 DIAGNOSIS — N912 Amenorrhea, unspecified: Secondary | ICD-10-CM

## 2020-10-27 DIAGNOSIS — E785 Hyperlipidemia, unspecified: Secondary | ICD-10-CM

## 2020-10-27 DIAGNOSIS — I10 Essential (primary) hypertension: Secondary | ICD-10-CM | POA: Diagnosis not present

## 2020-10-27 DIAGNOSIS — E1169 Type 2 diabetes mellitus with other specified complication: Secondary | ICD-10-CM | POA: Diagnosis not present

## 2020-10-27 DIAGNOSIS — E1165 Type 2 diabetes mellitus with hyperglycemia: Secondary | ICD-10-CM | POA: Diagnosis not present

## 2020-10-27 NOTE — Progress Notes (Signed)
Virtual Visit via Video Note  I connected with Emily Maddox on 10/27/20 at  8:00 AM EST by a video enabled telemedicine application and verified that I am speaking with the correct person using two identifiers.  Location/ people involved  Patient: home alone  Provider: home    I discussed the limitations of evaluation and management by telemedicine and the availability of in person appointments. The patient expressed understanding and agreed to proceed.  History of Present Illness: Pt is home and needs f/u ---- she got a job in [place] , [place] with the [employer]   She needs f/u dm, chol and bp.   HYPERTENSION   Blood pressure range-good per pt   Chest pain- no      Dyspnea- no Lightheadedness- no   Edema- no  Other side effects - no   Medication compliance: good Low salt diet- yes     DIABETES    Blood Sugar ranges-good per pt   Polyuria- no New Visual problems- no  Hypoglycemic symptoms- no  Other side effects-no Medication compliance - good Last eye exam-  Due  HYPERLIPIDEMIA  Medication compliance- good RUQ pain- no  Muscle aches- no Other side effects-no   \   Observations/Objective: Vitals:   10/27/20 0818  BP: 114/80  afebrile  Pt is in NAD   Assessment and Plan: 1. Uncontrolled type 2 diabetes mellitus with hyperglycemia (HCC) hgba1c to be checked , minimize simple carbs. Increase exercise as tolerated. Continue current meds  - Hemoglobin A1c; Future - Comprehensive metabolic panel; Future - Microalbumin / creatinine urine ratio; Future  2. Primary hypertension Well controlled, no changes to meds. Encouraged heart healthy diet such as the DASH diet and exercise as tolerated.  Pt off meds  - Lipid panel; Future - Hemoglobin A1c; Future - Comprehensive metabolic panel; Future - Microalbumin / creatinine urine ratio; Future  3. Hyperlipidemia associated with type 2 diabetes mellitus (HCC) Tolerating statin, encouraged heart healthy diet, avoid trans  fats, minimize simple carbs and saturated fats. Increase exercise as tolerated - Lipid panel; Future - Comprehensive metabolic panel; Future  4. Amenorrhea F/u gyn  - TSH; Future  Follow Up Instructions:    I discussed the assessment and treatment plan with the patient. The patient was provided an opportunity to ask questions and all were answered. The patient agreed with the plan and demonstrated an understanding of the instructions.   The patient was advised to call back or seek an in-person evaluation if the symptoms worsen or if the condition fails to improve as anticipated.  I provided 25 minutes of non-face-to-face time during this encounter.   Donato Schultz, DO

## 2020-11-02 ENCOUNTER — Other Ambulatory Visit: Payer: Medicare HMO

## 2020-11-02 DIAGNOSIS — Z20822 Contact with and (suspected) exposure to covid-19: Secondary | ICD-10-CM | POA: Diagnosis not present

## 2020-11-09 ENCOUNTER — Other Ambulatory Visit: Payer: Medicare HMO

## 2020-11-10 ENCOUNTER — Other Ambulatory Visit: Payer: Self-pay | Admitting: Family Medicine

## 2020-11-10 DIAGNOSIS — G894 Chronic pain syndrome: Secondary | ICD-10-CM

## 2020-11-10 DIAGNOSIS — G8929 Other chronic pain: Secondary | ICD-10-CM

## 2020-11-10 MED ORDER — HYDROCODONE-ACETAMINOPHEN 10-325 MG PO TABS: 1.0000 | ORAL_TABLET | Freq: Three times a day (TID) | ORAL | 0 refills | Status: DC | PRN

## 2020-11-10 NOTE — Telephone Encounter (Signed)
Requesting: NORCO Contract: 12/03/18 UDS: 12/03/18 Last OV: 10/27/2020 Next OV: N/A Last Refill:  10/12/2020, #60--0 RF Database:   Please advise

## 2020-11-10 NOTE — Telephone Encounter (Signed)
Medication: HYDROcodone-acetaminophen (NORCO) 10-325 MG tablet    Has the patient contacted their pharmacy? No. (If no, request that the patient contact the pharmacy for the refill.) (If yes, when and what did the pharmacy advise?)  Preferred Pharmacy (with phone number or street name):  [facility] - [place], [place] - [address]  [address], [place]  [address]  Phone:  [phone] Fax:  [phone]  DEA #:  --  DAW Reason: --     Agent: Please be advised that RX refills may take up to 3 business days. We ask that you follow-up with your pharmacy.  

## 2020-11-11 ENCOUNTER — Other Ambulatory Visit: Payer: Medicare HMO

## 2020-12-13 ENCOUNTER — Other Ambulatory Visit: Payer: Self-pay | Admitting: Family Medicine

## 2020-12-13 DIAGNOSIS — G894 Chronic pain syndrome: Secondary | ICD-10-CM

## 2020-12-13 DIAGNOSIS — G8929 Other chronic pain: Secondary | ICD-10-CM

## 2020-12-13 MED ORDER — HYDROCODONE-ACETAMINOPHEN 10-325 MG PO TABS: 1.0000 | ORAL_TABLET | Freq: Three times a day (TID) | ORAL | 0 refills | Status: DC | PRN

## 2020-12-13 NOTE — Telephone Encounter (Signed)
Requesting: NORCO Contract: 12/03/2018 UDS: 12/03/2018, low risk  Last OV: 10/27/2020 Next OV: N/A Last Refill: 11/10/2020, #60--0 RF Database:   Please advise

## 2020-12-13 NOTE — Telephone Encounter (Signed)
Medication:  HYDROcodone-acetaminophen (NORCO) 10-325 MG tablet   Has the patient contacted their pharmacy? No. (If no, request that the patient contact the pharmacy for the refill.) (If yes, when and what did the pharmacy advise?)  Preferred Pharmacy (with phone number or street name):  [facility] [address] [place] - [address] [place], [place] [address]  Phone:  [phone] Fax:  [phone]  DEA #:  -- Agent: Please be advised that RX refills may take up to 3 business days. We ask that you follow-up with your pharmacy.

## 2021-01-07 ENCOUNTER — Other Ambulatory Visit: Payer: Self-pay | Admitting: Family Medicine

## 2021-01-07 DIAGNOSIS — G894 Chronic pain syndrome: Secondary | ICD-10-CM

## 2021-01-07 DIAGNOSIS — G8929 Other chronic pain: Secondary | ICD-10-CM

## 2021-01-07 MED ORDER — HYDROCODONE-ACETAMINOPHEN 10-325 MG PO TABS: 1.0000 | ORAL_TABLET | Freq: Three times a day (TID) | ORAL | 0 refills | Status: DC | PRN

## 2021-01-07 NOTE — Telephone Encounter (Signed)
Requesting: NORCO Contract: 12/03/18 UDS: 12/03/18 Last OV: 10/27/2020, virtual Next OV: 01/21/21-- Lab Last Refill: 12/13/2020, #60--0 RF Database:   Please advise

## 2021-01-07 NOTE — Telephone Encounter (Signed)
Medication: HYDROcodone-acetaminophen (NORCO) 10-325 MG tablet    Has the patient contacted their pharmacy? No. (If no, request that the patient contact the pharmacy for the refill.) (If yes, when and what did the pharmacy advise?)  Preferred Pharmacy (with phone number or street name):  [facility], [place], [place] [address]  Phone:  [phone] Fax:  [phone]  DEA #:  --  DAW Reason: --     Agent: Please be advised that RX refills may take up to 3 business days. We ask that you follow-up with your pharmacy.

## 2021-01-10 DIAGNOSIS — D259 Leiomyoma of uterus, unspecified: Secondary | ICD-10-CM | POA: Diagnosis not present

## 2021-01-21 ENCOUNTER — Other Ambulatory Visit (INDEPENDENT_AMBULATORY_CARE_PROVIDER_SITE_OTHER): Payer: Medicare HMO

## 2021-01-21 ENCOUNTER — Other Ambulatory Visit: Payer: Self-pay

## 2021-01-21 DIAGNOSIS — E1169 Type 2 diabetes mellitus with other specified complication: Secondary | ICD-10-CM | POA: Diagnosis not present

## 2021-01-21 DIAGNOSIS — N912 Amenorrhea, unspecified: Secondary | ICD-10-CM | POA: Diagnosis not present

## 2021-01-21 DIAGNOSIS — I10 Essential (primary) hypertension: Secondary | ICD-10-CM | POA: Diagnosis not present

## 2021-01-21 DIAGNOSIS — E785 Hyperlipidemia, unspecified: Secondary | ICD-10-CM

## 2021-01-21 DIAGNOSIS — E1165 Type 2 diabetes mellitus with hyperglycemia: Secondary | ICD-10-CM | POA: Diagnosis not present

## 2021-01-21 LAB — COMPREHENSIVE METABOLIC PANEL
ALT: 7 U/L (ref 0–35)
AST: 13 U/L (ref 0–37)
Albumin: 4.1 g/dL (ref 3.5–5.2)
Alkaline Phosphatase: 55 U/L (ref 39–117)
BUN: 12 mg/dL (ref 6–23)
CO2: 27 mEq/L (ref 19–32)
Calcium: 9.1 mg/dL (ref 8.4–10.5)
Chloride: 104 mEq/L (ref 96–112)
Creatinine, Ser: 0.98 mg/dL (ref 0.40–1.20)
GFR: 68.3 mL/min (ref >60.00)
Glucose, Bld: 84 mg/dL (ref 70–99)
Potassium: 4.7 mEq/L (ref 3.5–5.1)
Sodium: 139 mEq/L (ref 135–145)
Total Bilirubin: 0.3 mg/dL (ref 0.2–1.2)
Total Protein: 6.8 g/dL (ref 6.0–8.3)

## 2021-01-21 LAB — MICROALBUMIN / CREATININE URINE RATIO
Creatinine,U: 115.2 mg/dL
Microalb Creat Ratio: 0.6 mg/g (ref 0.0–30.0)
Microalb, Ur: <0.7 mg/dL (ref 0.0–1.9)

## 2021-01-21 LAB — HEMOGLOBIN A1C: Hgb A1c MFr Bld: 5.9 % (ref 4.6–6.5)

## 2021-01-21 LAB — LIPID PANEL
Cholesterol: 149 mg/dL (ref 0–200)
HDL: 58.6 mg/dL (ref >39.00)
LDL Cholesterol: 73 mg/dL (ref 0–99)
NonHDL: 90.54
Total CHOL/HDL Ratio: 3
Triglycerides: 90 mg/dL (ref 0.0–149.0)
VLDL: 18 mg/dL (ref 0.0–40.0)

## 2021-01-21 LAB — TSH: TSH: 1.99 u[IU]/mL (ref 0.35–4.50)

## 2021-02-07 ENCOUNTER — Other Ambulatory Visit: Payer: Self-pay | Admitting: Family Medicine

## 2021-02-07 DIAGNOSIS — G894 Chronic pain syndrome: Secondary | ICD-10-CM

## 2021-02-07 DIAGNOSIS — G8929 Other chronic pain: Secondary | ICD-10-CM

## 2021-02-07 NOTE — Telephone Encounter (Signed)
Medication: HYDROcodone-acetaminophen (NORCO) 10-325 MG tablet [[id number]]     Has the patient contacted their pharmacy?    Preferred Pharmacy (with phone number [facility] street [facility] [place] [facility]

## 2021-02-08 MED ORDER — HYDROCODONE-ACETAMINOPHEN 10-325 MG PO TABS: 1.0000 | ORAL_TABLET | Freq: Three times a day (TID) | ORAL | 0 refills | Status: DC | PRN

## 2021-02-08 NOTE — Telephone Encounter (Signed)
Requesting: Norco  Contract: 12/03/18 UDS: 12/03/18 Last Visit: 10/27/2020 Next Visit: none Last Refill: 01/07/2021

## 2021-03-10 ENCOUNTER — Other Ambulatory Visit: Payer: Self-pay | Admitting: Family Medicine

## 2021-03-10 ENCOUNTER — Other Ambulatory Visit: Payer: Self-pay

## 2021-03-10 DIAGNOSIS — G8929 Other chronic pain: Secondary | ICD-10-CM

## 2021-03-10 DIAGNOSIS — G894 Chronic pain syndrome: Secondary | ICD-10-CM

## 2021-03-10 MED ORDER — HYDROCODONE-ACETAMINOPHEN 10-325 MG PO TABS: 1.0000 | ORAL_TABLET | Freq: Three times a day (TID) | ORAL | 0 refills | Status: DC | PRN

## 2021-03-10 NOTE — Progress Notes (Signed)
sent 

## 2021-03-10 NOTE — Progress Notes (Unsigned)
Requesting: Hydrocodone Contract: 05/31/20 UDS:  05/31/20 Last Visit: 01/21/21 Next Visit: 03/14/21 (video) Last Refill: 01/21/2021  Please Advise

## 2021-03-14 ENCOUNTER — Telehealth (INDEPENDENT_AMBULATORY_CARE_PROVIDER_SITE_OTHER): Payer: Medicare HMO | Admitting: Family Medicine

## 2021-03-14 ENCOUNTER — Encounter: Payer: Self-pay | Admitting: Family Medicine

## 2021-03-14 ENCOUNTER — Other Ambulatory Visit: Payer: Self-pay

## 2021-03-14 VITALS — BP 140/90 | HR 88 | Ht 66.0 in | Wt 180.0 lb

## 2021-03-14 DIAGNOSIS — E663 Overweight: Secondary | ICD-10-CM

## 2021-03-14 DIAGNOSIS — M5136 Other intervertebral disc degeneration, lumbar region: Secondary | ICD-10-CM

## 2021-03-14 NOTE — Progress Notes (Signed)
MyChart Video Visit    Virtual Visit via Video Note   This visit type was conducted due to national recommendations for restrictions regarding the COVID-19 Pandemic (e.g. social distancing) in an effort to limit this patient's exposure and mitigate transmission in our community. This patient is at least at moderate risk for complications without adequate follow up. This format is felt to be most appropriate for this patient at this time. Physical exam was limited by quality of the video and audio technology used for the visit. Luster Landsberg was able to get the patient set up on a video visit.  Patient location: homealone Patient and provider in visit Provider location: Office  I discussed the limitations of evaluation and management by telemedicine and the availability of in person appointments. The patient expressed understanding and agreed to proceed.  Visit Date: 03/14/2021  Today's healthcare provider: Donato Schultz, DO     Subjective:    Patient ID: Emily Maddox, female    DOB: 03/05/1971, 50 y.o.   MRN: [id number]  No chief complaint on file.    HPI Patient is in today for f/u and is struggling with weight gain with the new job    She also needs accomodation letter filled out for work  ---  She needs the letter to stay she needs to sit and stand as needed during the day.  She also needs to work closer to home   Past Medical History:  Diagnosis Date  . 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 Diabetes mellitus   . Hyperlipidemia   . Tachycardia     Past Surgical History:  Procedure Laterality Date  . BREAST EXCISIONAL BIOPSY Left 2003    Family History  Problem Relation Age of Onset  . Cancer Mother 12       breast  . Hypertension Mother   . Hyperlipidemia Mother   . Diabetes Mother   . Breast cancer Mother 71  . Cancer Father 41       colon  . Hypertension Father   . Hyperlipidemia Father   .  Heart disease Father        a fib  . Diabetes Father   . Diabetes Sister   . Breast cancer Other   . Colon cancer Other     Social History   Socioeconomic History  . Marital status: Single    Spouse name: Not on file  . Number of children: Not on file  . Years of education: Not on file  . Highest education level: Not on file  Occupational History    Employer: UNEMPLOYED  Tobacco Use  . Smoking status: Current Every Day Smoker    Packs/day: 1.00    Years: 19.00    Pack years: 19.00    Types: Cigarettes  . Smokeless tobacco: Never Used  . Tobacco comment: couldn't tolerate chantix, cut down to 0.5 pack since 11/2013  Vaping Use  . Vaping Use: Never used  Substance and Sexual Activity  . Alcohol use: No  . Drug use: No  . Sexual activity: Yes    Partners: Female  Other Topics Concern  . Not on file  Social History Narrative   Lives alone.     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 in business administration   Exercise--  3-4 x a week   Social Determinants of Health   Financial Resource Strain: Low Risk   . Difficulty of Paying  Living Expenses: Not hard at all  Food Insecurity: No Food Insecurity  . Worried About Charity fundraiser in the Last Year: Never true  . Ran Out of Food in the Last Year: Never true  Transportation Needs: No Transportation Needs  . Lack of Transportation (Medical): No  . Lack of Transportation (Non-Medical): No  Physical Activity: Not on file  Stress: Not on file  Social Connections: Not on file  Intimate Partner Violence: Not on file    Outpatient Medications Prior to Visit  Medication Sig Dispense Refill  . Blood Glucose Monitoring Suppl ([place]) w/Device KIT 1 each by Does not apply route daily. Dx code: E11.9 1 kit 0  . glucose blood (ONETOUCH VERIO) test strip USE 1 STRIP TO CHECK GLUCOSE ONCE DAILY 100 each 12  . HYDROcodone-acetaminophen (NORCO) 10-325 MG tablet Take 1 tablet by mouth every 8 (eight) hours as  needed. 60 tablet 0  . ONETOUCH DELICA LANCETS 61P MISC Check sugar every day.  Dx Code: E11.9 100 each 1  . Probiotic Product (PROBIOTIC DAILY PO) Take 1 capsule by mouth daily.    . rosuvastatin (CRESTOR) 40 MG tablet Take 1 tablet by mouth once daily 90 tablet 3   No facility-administered medications prior to visit.    Allergies  Allergen Reactions  . Metoprolol Other (See Comments)    exhaustion  . Iodinated Diagnostic Agents   . Latex   . Shellfish Allergy   . Ultram [Tramadol] Palpitations    ROS     Objective:    Physical Exam Vitals and nursing note reviewed.  Constitutional:      Appearance: Normal appearance.  Pulmonary:     Effort: Pulmonary effort is normal.  Neurological:     Mental Status: She is alert.     BP 140/90   Pulse 88   Ht $R'5\' 6"'pk$  (1.676 m)   Wt 180 lb (81.6 kg)   BMI 29.05 kg/m  Wt Readings from Last 3 Encounters:  03/14/21 180 lb (81.6 kg)  10/27/20 170 lb (77.1 kg)  05/31/20 168 lb 3.2 oz (76.3 kg)    Diabetic Foot Exam - Simple   No data filed    Lab Results  Component Value Date   WBC 8.1 12/03/2018   HGB 12.9 12/03/2018   HCT 38.4 12/03/2018   PLT 222.0 12/03/2018   GLUCOSE 84 01/21/2021   CHOL 149 01/21/2021   TRIG 90.0 01/21/2021   HDL 58.60 01/21/2021   LDLDIRECT 143.7 11/12/2013   LDLCALC 73 01/21/2021   ALT 7 01/21/2021   AST 13 01/21/2021   NA 139 01/21/2021   K 4.7 01/21/2021   CL 104 01/21/2021   CREATININE 0.98 01/21/2021   BUN 12 01/21/2021   CO2 27 01/21/2021   TSH 1.99 01/21/2021   HGBA1C 5.9 01/21/2021   MICROALBUR <0.7 01/21/2021    Lab Results  Component Value Date   TSH 1.99 01/21/2021   Lab Results  Component Value Date   WBC 8.1 12/03/2018   HGB 12.9 12/03/2018   HCT 38.4 12/03/2018   MCV 98.0 12/03/2018   PLT 222.0 12/03/2018   Lab Results  Component Value Date   NA 139 01/21/2021   K 4.7 01/21/2021   CO2 27 01/21/2021   GLUCOSE 84 01/21/2021   BUN 12 01/21/2021   CREATININE  0.98 01/21/2021   BILITOT 0.3 01/21/2021   ALKPHOS 55 01/21/2021   AST 13 01/21/2021   ALT 7 01/21/2021   PROT  6.8 01/21/2021   ALBUMIN 4.1 01/21/2021   CALCIUM 9.1 01/21/2021   ANIONGAP 10 04/07/2018   GFR 68.30 01/21/2021   Lab Results  Component Value Date   CHOL 149 01/21/2021   Lab Results  Component Value Date   HDL 58.60 01/21/2021   Lab Results  Component Value Date   LDLCALC 73 01/21/2021   Lab Results  Component Value Date   TRIG 90.0 01/21/2021   Lab Results  Component Value Date   CHOLHDL 3 01/21/2021   Lab Results  Component Value Date   HGBA1C 5.9 01/21/2021       Assessment & Plan:   Problem List Items Addressed This Visit      Unprioritized   Lumbar degenerative disc disease    Database ok  uds / contract ok  Accommodation form will be filled out       Other Visit Diagnoses    Overweight (BMI 25.0-29.9)    -  Primary      I am having [name] maintain her Probiotic Product (PROBIOTIC DAILY PO), OneTouch Verio Flex System, OneTouch Delica Lancets 67O, OneTouch Verio, rosuvastatin, and HYDROcodone-acetaminophen.  No orders of the defined types were placed in this encounter.   I discussed the assessment and treatment plan with the patient. The patient was provided an opportunity to ask questions and all were answered. The patient agreed with the plan and demonstrated an understanding of the instructions.   The patient was advised to call back or seek an in-person evaluation if the symptoms worsen or if the condition fails to improve as anticipated.     [name], DO [place] [facility] [phone] (phone) [phone] (fax)  [place]

## 2021-03-14 NOTE — Assessment & Plan Note (Signed)
Database ok  uds / contract ok  Accommodation form will be filled out

## 2021-03-30 NOTE — Progress Notes (Signed)
Subjective:   Emily Maddox is a 50 y.o. female who presents for Medicare Annual (Subsequent) preventive examination.  I connected with Tonea today by telephone and verified that I am speaking with the correct person using two identifiers. Location patient: home Location provider: work Persons participating in the virtual visit: patient, Engineer, civil (consulting).    I discussed the limitations, risks, security and privacy concerns of performing an evaluation and management service by telephone and the availability of in person appointments. I also discussed with the patient that there may be a patient responsible charge related to this service. The patient expressed understanding and verbally consented to this telephonic visit.    Interactive audio and video telecommunications were attempted between this provider and patient, however failed, due to patient having technical difficulties OR patient did not have access to video capability.  We continued and completed visit with audio only.  Some vital signs may be absent or patient reported.   Time Spent with patient on telephone encounter: 20 minutes   Review of Systems     Cardiac Risk Factors include: diabetes mellitus;dyslipidemia;hypertension     Objective:    Today's Vitals   03/31/21 1458  Weight: 180 lb (81.6 kg)  Height: 5\' 6"  (1.676 m)  PainSc: 5    Body mass index is 29.05 kg/m.  Advanced Directives 03/31/2021 03/29/2020 01/28/2019 03/06/2017 11/11/2015 10/11/2015  Does Patient Have a Medical Advance Directive? Yes Yes Yes No No No  Type of 10/13/2015 of [place];Living will Healthcare Power of [place];Living will Healthcare Power of [place];Living will - - -  Does patient want to make changes to medical advance directive? - No - Patient declined No - Patient declined - - -  Copy of Healthcare Power of Attorney in Chart? No - copy requested No - copy requested Yes - validated most recent copy scanned in chart (See  row information) - - -  Would patient like information on creating a medical advance directive? - - - Yes (MAU/Ambulatory/Procedural Areas - Information given) No - patient declined information No - patient declined information    Current Medications (verified) Outpatient Encounter Medications as of 03/31/2021  Medication Sig   Blood Glucose Monitoring Suppl (ONETOUCH VERIO FLEX SYSTEM) w/Device KIT 1 each by Does not apply route daily. Dx code: E11.9   glucose blood (ONETOUCH VERIO) test strip USE 1 STRIP TO CHECK GLUCOSE ONCE DAILY   HYDROcodone-acetaminophen (NORCO) 10-325 MG tablet Take 1 tablet by mouth every 8 (eight) hours as needed.   ONETOUCH DELICA LANCETS 33G MISC Check sugar every day.  Dx Code: E11.9   Probiotic Product (PROBIOTIC DAILY PO) Take 1 capsule by mouth daily.   rosuvastatin (CRESTOR) 40 MG tablet Take 1 tablet by mouth once daily   No facility-administered encounter medications on file as of 03/31/2021.    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   Diabetes mellitus    Hyperlipidemia    Tachycardia    Past Surgical History:  Procedure Laterality Date   BREAST EXCISIONAL BIOPSY Left 2003   Family History  Problem Relation Age of Onset   Cancer Mother 51       breast   Hypertension Mother    Hyperlipidemia Mother    Diabetes Mother    Breast cancer Mother 34   Cancer Father 1  colon   Hypertension Father    Hyperlipidemia Father    Heart disease Father        a fib   Diabetes Father    Diabetes Sister    Breast cancer Other    Colon cancer Other    Social History   Socioeconomic History   Marital status: Single    Spouse name: Not on file   Number of children: Not on file   Years of education: Not on file   Highest education level:  Not on file  Occupational History    Employer: UNEMPLOYED  Tobacco Use   Smoking status: Every Day    Packs/day: 1.00    Years: 19.00    Pack years: 19.00    Types: Cigarettes   Smokeless tobacco: Never   Tobacco comments:    couldn't tolerate chantix, cut down to 0.5 pack since 11/2013  Vaping Use   Vaping Use: Never used  Substance and Sexual Activity   Alcohol use: No   Drug use: No   Sexual activity: Yes    Partners: Female  Other Topics Concern   Not on file  Social History Narrative   Lives alone.     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 in business administration   Exercise--  3-4 x a week   Social Determinants of Health   Financial Resource Strain: Low Risk    Difficulty of Paying Living Expenses: Not hard at all  Food Insecurity: No Food Insecurity   Worried About Charity fundraiser in the Last Year: Never true   Arboriculturist in the Last Year: Never true  Transportation Needs: No Transportation Needs   Lack of Transportation (Medical): No   Lack of Transportation (Non-Medical): No  Physical Activity: Sufficiently Active   Days of Exercise per Week: 3 days   Minutes of Exercise per Session: 50 min  Stress: Stress Concern Present   Feeling of Stress : To some extent  Social Connections: Moderately Isolated   Frequency of Communication with Friends and Family: More than three times a week   Frequency of Social Gatherings with Friends and Family: More than three times a week   Attends Religious Services: More than 4 times per year   Active Member of [employer] or Organizations: Not on file   Attends Archivist Meetings: Never   Marital Status: Never married    Tobacco Counseling Ready to quit: Not Answered Counseling given: Not Answered Tobacco comments: couldn't tolerate chantix, cut down to 0.5 pack since 11/2013   Clinical Intake:  Pre-visit preparation completed: Yes  Pain : 0-10 Pain Score: 5  Pain Type: Chronic pain Pain Location: Back  (and pelvis) Pain Onset: More than a month ago Pain Frequency: Intermittent     Nutritional Status: BMI 25 -29 Overweight Nutritional Risks: None Diabetes: Yes CBG done?: No Did pt. bring in CBG monitor from home?: No (phone visit)  How often do you need to have someone help you when you read instructions, pamphlets, or other written materials from your doctor or pharmacy?: 1 - Never  Diabetes:  Is the patient diabetic?  No  If diabetic, was a CBG obtained today?  No  Did the patient bring in their glucometer from home?  No phone visit How often do you monitor your CBG's? daily.   Financial Strains and Diabetes Management:  Are you having any financial strains with the device, your supplies or your medication? No .  Does the patient  want to be seen by Chronic Care Management for management of their diabetes?  No  Would the patient like to be referred to a Nutritionist or for Diabetic Management?  Yes   Diabetic Exams:  Diabetic Eye Exam: . Overdue for diabetic eye exam. Pt has been advised about the importance in completing this exam. Patient plans to make an appt soon  Diabetic Foot Exam:  Pt has been advised about the importance in completing this exam. To be completed by PCP        Information entered by :: Caroleen Hamman LPN   Activities of Daily Living In your present state of health, do you have any difficulty performing the following activities: 03/31/2021  Hearing? N  Vision? N  Difficulty concentrating or making decisions? N  Walking or climbing stairs? N  Dressing or bathing? N  Doing errands, shopping? N  Preparing Food and eating ? N  Using the Toilet? N  In the past six months, have you accidently leaked urine? Y  Do you have problems with loss of bowel control? N  Managing your Medications? N  Managing your Finances? N  Housekeeping or managing your Housekeeping? N  Some recent data might be hidden    Patient Care Team: Carollee Herter, Alferd Apa, DO  as PCP - General Juanita Craver, MD as Consulting Physician (Gastroenterology) Linda Hedges, DO as Consulting Physician (Obstetrics and Gynecology)  Indicate any recent Medical Services you may have received from other than [facility] providers in the past year (date may be approximate).     Assessment:   This is a routine wellness examination for [place].  Hearing/Vision screen Hearing Screening - Comments:: No issues Vision Screening - Comments:: Wears contacts Last eye exam-2 years ago-  Dietary issues and exercise activities discussed: Current Exercise Habits: Home exercise routine, Type of exercise: Other - see comments (Recumbent bike), Time (Minutes): 45, Frequency (Times/Week): 3, Weekly Exercise (Minutes/Week): 135, Intensity: Mild, Exercise limited by: None identified   Goals Addressed             This Visit's Progress    Quit smoking / using tobacco   Not on track      Depression Screen PHQ 2/9 Scores 03/31/2021 03/29/2020 01/28/2019 11/14/2018 03/06/2017 08/29/2016 11/11/2015  PHQ - 2 Score 0 0 0 0 0 0 0  PHQ- 9 Score - - - 4 - - -    Fall Risk Fall Risk  03/31/2021 03/29/2020 01/28/2019 03/06/2017 11/11/2015  Falls in the past year? 0 0 0 Yes Yes  Number falls in past yr: 0 0 - 2 or more 2 or more  Injury with Fall? 0 0 - No No  Risk Factor Category  - - - - High Fall Risk  Risk for fall due to : - - - Impaired balance/gait Impaired balance/gait;History of fall(s)  Risk for fall due to: Comment - - - - occasional leg weakness and numbness  Follow up Falls prevention discussed Education provided;Falls prevention discussed - - Education provided;Falls prevention discussed    FALL RISK PREVENTION PERTAINING TO THE HOME:  Any stairs in or around the home? Yes  If so, are there any without handrails? No  Home free of loose throw rugs in walkways, pet beds, electrical cords, etc? Yes  Adequate lighting in your home to reduce risk of falls? Yes   ASSISTIVE DEVICES UTILIZED TO PREVENT  FALLS:  Life alert? No  Use of a cane, walker or w/c? No  Grab bars in the  bathroom? No  Shower chair or bench in shower? No  Elevated toilet seat or a handicapped toilet? Yes   TIMED UP AND GO:  Was the test performed? No . Phone visit   Cognitive Function:Normal cognitive status assessed by this Nurse Health Advisor. No abnormalities found.          Immunizations Immunization History  Administered Date(s) Administered   Hep A / Hep B 02/03/2009, 04/05/2009, 09/03/2009, 10/31/2010   PFIZER(Purple Top)SARS-COV-2 Vaccination 05/04/2020, 05/26/2020   PPD Test 08/08/2017   Td 02/03/2009    TDAP status: Due, Education has been provided regarding the importance of this vaccine. Advised may receive this vaccine at local pharmacy or Health Dept. Aware to provide a copy of the vaccination record if obtained from local pharmacy or Health Dept. Verbalized acceptance and understanding.  Flu vaccine status: Due 06/2021  Pneumococcal vaccine status:Not yet indicated  Covid-19 vaccine status: Information provided on how to obtain vaccines. Booster due  Qualifies for Shingles Vaccine? No  not yet indicated   Screening Tests Health Maintenance  Topic Date Due   PNEUMOCOCCAL POLYSACCHARIDE VACCINE AGE 89-64 HIGH RISK  Never done   Pneumococcal Vaccine 86-37 Years old (1 - PCV) Never done   Hepatitis C Screening  Never done   Zoster Vaccines- Shingrix (1 of 2) Never done   PAP SMEAR-Modifier  07/27/2017   OPHTHALMOLOGY EXAM  11/23/2017   FOOT EXAM  10/29/2018   TETANUS/TDAP  02/04/2019   COVID-19 Vaccine (3 - Pfizer risk series) 06/23/2020   MAMMOGRAM  09/03/2020   COLONOSCOPY (Pts 45-75yrs Insurance coverage will need to be confirmed)  01/02/2021   INFLUENZA VACCINE  05/23/2021   HEMOGLOBIN A1C  07/23/2021   URINE MICROALBUMIN  01/21/2022   HIV Screening  Completed   HPV VACCINES  Aged Out    Health Maintenance  Health Maintenance Due  Topic Date Due   PNEUMOCOCCAL  POLYSACCHARIDE VACCINE AGE 89-64 HIGH RISK  Never done   Pneumococcal Vaccine 37-42 Years old (1 - PCV) Never done   Hepatitis C Screening  Never done   Zoster Vaccines- Shingrix (1 of 2) Never done   PAP SMEAR-Modifier  07/27/2017   OPHTHALMOLOGY EXAM  11/23/2017   FOOT EXAM  10/29/2018   TETANUS/TDAP  02/04/2019   COVID-19 Vaccine (3 - Pfizer risk series) 06/23/2020   MAMMOGRAM  09/03/2020   COLONOSCOPY (Pts 45-84yrs Insurance coverage will need to be confirmed)  01/02/2021    Colorectal Cancer screening: Due-Patient states she has received a reminder letter from GI & plans to call them to schedule.  Mammogram status: Ordered today. Pt provided with contact info and advised to call to schedule appt.   Bone Density status: Not yet indicated  Lung Cancer Screening: (Low Dose CT Chest recommended if Age 71-80 years, 30 pack-year currently smoking OR have quit w/in 15years.) does not qualify.     Additional Screening:  Hepatitis C Screening: does not qualify  Vision Screening: Recommended annual ophthalmology exams for early detection of glaucoma and other disorders of the eye. Is the patient up to date with their annual eye exam?  No  Who is the provider or what is the name of the office in which the patient attends annual eye exams? unsure   Dental Screening: Recommended annual dental exams for proper oral hygiene  Community Resource Referral / Chronic Care Management: CRR required this visit?  No   CCM required this visit?  No      Plan:  I have personally reviewed and noted the following in the patient's chart:   Medical and social history Use of alcohol, tobacco or illicit drugs  Current medications and supplements including opioid prescriptions.  Functional ability and status Nutritional status Physical activity Advanced directives List of other physicians Hospitalizations, surgeries, and ER visits in previous 12 months Vitals Screenings to include  cognitive, depression, and falls Referrals and appointments  In addition, I have reviewed and discussed with patient certain preventive protocols, quality metrics, and best practice recommendations. A written personalized care plan for preventive services as well as general preventive health recommendations were provided to patient.   Due to this being a telephonic visit, the after visit summary with patients personalized plan was offered to patient via mail or my-chart. Patient would like to access on my-chart.   Marta Antu, LPN   12/26/2479  Nurse Health Advisor  Nurse Notes: None

## 2021-03-31 ENCOUNTER — Ambulatory Visit (INDEPENDENT_AMBULATORY_CARE_PROVIDER_SITE_OTHER): Payer: Medicare HMO

## 2021-03-31 VITALS — Ht 66.0 in | Wt 180.0 lb

## 2021-03-31 DIAGNOSIS — Z0000 Encounter for general adult medical examination without abnormal findings: Secondary | ICD-10-CM | POA: Diagnosis not present

## 2021-03-31 DIAGNOSIS — Z1231 Encounter for screening mammogram for malignant neoplasm of breast: Secondary | ICD-10-CM | POA: Diagnosis not present

## 2021-03-31 NOTE — Patient Instructions (Signed)
Emily Maddox , Thank you for taking time to complete your Medicare Wellness Visit. I appreciate your ongoing commitment to your health goals. Please review the following plan we discussed and let me know if I can assist you in the future.   Screening recommendations/referrals: Colonoscopy: Due. Per pour conversation, you will call GI to schedule. Mammogram: Ordered today. Someone will be calling you to schedule. Bone Density: Not yet indicated. Due at age 50. Recommended yearly ophthalmology/optometry visit for glaucoma screening and checkup Recommended yearly dental visit for hygiene and checkup  Vaccinations: Influenza vaccine: Due-06/2921 Pneumococcal vaccine: Not yet indicated-Due at age 3 Tdap vaccine: Discuss with pharmacy Shingles vaccine: Not yet indicated-Due at age 19  Covid-19: Booster due  Advanced directives: Please bring a copy for your chart  Conditions/risks identified: See problem list  Next appointment: Follow up in one year for your annual wellness visit. 04/06/2022 @ 3:40.  Preventive Care 40-64 Years, Female Preventive care refers to lifestyle choices and visits with your health care provider that can promote health and wellness. What does preventive care include? A yearly physical exam. This is also called an annual well check. Dental exams once or twice a year. Routine eye exams. Ask your health care provider how often you should have your eyes checked. Personal lifestyle choices, including: Daily care of your teeth and gums. Regular physical activity. Eating a healthy diet. Avoiding tobacco and drug use. Limiting alcohol use. Practicing safe sex. Taking low-dose aspirin daily starting at age 58. Taking vitamin and mineral supplements as recommended by your health care provider. What happens during an annual well check? The services and screenings done by your health care provider during your annual well check will depend on your age, overall health,  lifestyle risk factors, and family history of disease. Counseling  Your health care provider may ask you questions about your: Alcohol use. Tobacco use. Drug use. Emotional well-being. Home and relationship well-being. Sexual activity. Eating habits. Work and work Statistician. Method of birth control. Menstrual cycle. Pregnancy history. Screening  You may have the following tests or measurements: Height, weight, and BMI. Blood pressure. Lipid and cholesterol levels. These may be checked every 5 years, or more frequently if you are over 63 years old. Skin check. Lung cancer screening. You may have this screening every year starting at age 49 if you have a 30-pack-year history of smoking and currently smoke or have quit within the past 15 years. Fecal occult blood test (FOBT) of the stool. You may have this test every year starting at age 25. Flexible sigmoidoscopy or colonoscopy. You may have a sigmoidoscopy every 5 years or a colonoscopy every 10 years starting at age 96. Hepatitis C blood test. Hepatitis B blood test. Sexually transmitted disease (STD) testing. Diabetes screening. This is done by checking your blood sugar (glucose) after you have not eaten for a while (fasting). You may have this done every 1-3 years. Mammogram. This may be done every 1-2 years. Talk to your health care provider about when you should start having regular mammograms. This may depend on whether you have a family history of breast cancer. BRCA-related cancer screening. This may be done if you have a family history of breast, ovarian, tubal, or peritoneal cancers. Pelvic exam and Pap test. This may be done every 3 years starting at age 2. Starting at age 51, this may be done every 5 years if you have a Pap test in combination with an HPV test. Bone density scan. This is  done to screen for osteoporosis. You may have this scan if you are at high risk for osteoporosis. Discuss your test results, treatment  options, and if necessary, the need for more tests with your health care provider. Vaccines  Your health care provider may recommend certain vaccines, such as: Influenza vaccine. This is recommended every year. Tetanus, diphtheria, and acellular pertussis (Tdap, Td) vaccine. You may need a Td booster every 10 years. Zoster vaccine. You may need this after age 8. Pneumococcal 13-valent conjugate (PCV13) vaccine. You may need this if you have certain conditions and were not previously vaccinated. Pneumococcal polysaccharide (PPSV23) vaccine. You may need one or two doses if you smoke cigarettes or if you have certain conditions. Talk to your health care provider about which screenings and vaccines you need and how often you need them. This information is not intended to replace advice given to you by your health care provider. Make sure you discuss any questions you have with your health care provider. Document Released: 11/05/2015 Document Revised: 06/28/2016 Document Reviewed: 08/10/2015 Elsevier Interactive Patient Education  2017 [place] Prevention in the Home Falls can cause injuries. They can happen to people of all ages. There are many things you can do to make your home safe and to help prevent falls. What can I do on the outside of my home? Regularly fix the edges of walkways and driveways and fix any cracks. Remove anything that might make you trip as you walk through a door, such as a raised step or threshold. Trim any bushes or trees on the path to your home. Use bright outdoor lighting. Clear any walking paths of anything that might make someone trip, such as rocks or tools. Regularly check to see if handrails are loose or broken. Make sure that both sides of any steps have handrails. Any raised decks and porches should have guardrails on the edges. Have any leaves, snow, or ice cleared regularly. Use sand or salt on walking paths during winter. Clean up any  spills in your garage right away. This includes oil or grease spills. What can I do in the bathroom? Use night lights. Install grab bars by the toilet and in the tub and shower. Do not use towel bars as grab bars. Use non-skid mats or decals in the tub or shower. If you need to sit down in the shower, use a plastic, non-slip stool. Keep the floor dry. Clean up any water that spills on the floor as soon as it happens. Remove soap buildup in the tub or shower regularly. Attach bath mats securely with double-sided non-slip rug tape. Do not have throw rugs and other things on the floor that can make you trip. What can I do in the bedroom? Use night lights. Make sure that you have a light by your bed that is easy to reach. Do not use any sheets or blankets that are too big for your bed. They should not hang down onto the floor. Have a firm chair that has side arms. You can use this for support while you get dressed. Do not have throw rugs and other things on the floor that can make you trip. What can I do in the kitchen? Clean up any spills right away. Avoid walking on wet floors. Keep items that you use a lot in easy-to-reach places. If you need to reach something above you, use a strong step stool that has a grab bar. Keep electrical cords out of  the way. Do not use floor polish or wax that makes floors slippery. If you must use wax, use non-skid floor wax. Do not have throw rugs and other things on the floor that can make you trip. What can I do with my stairs? Do not leave any items on the stairs. Make sure that there are handrails on both sides of the stairs and use them. Fix handrails that are broken or loose. Make sure that handrails are as long as the stairways. Check any carpeting to make sure that it is firmly attached to the stairs. Fix any carpet that is loose or worn. Avoid having throw rugs at the top or bottom of the stairs. If you do have throw rugs, attach them to the floor  with carpet tape. Make sure that you have a light switch at the top of the stairs and the bottom of the stairs. If you do not have them, ask someone to add them for you. What else can I do to help prevent falls? Wear shoes that: Do not have high heels. Have rubber bottoms. Are comfortable and fit you well. Are closed at the toe. Do not wear sandals. If you use a stepladder: Make sure that it is fully opened. Do not climb a closed stepladder. Make sure that both sides of the stepladder are locked into place. Ask someone to hold it for you, if possible. Clearly mark and make sure that you can see: Any grab bars or handrails. First and last steps. Where the edge of each step is. Use tools that help you move around (mobility aids) if they are needed. These include: Canes. Walkers. Scooters. Crutches. Turn on the lights when you go into a dark area. Replace any light bulbs as soon as they burn out. Set up your furniture so you have a clear path. Avoid moving your furniture around. If any of your floors are uneven, fix them. If there are any pets around you, be aware of where they are. Review your medicines with your doctor. Some medicines can make you feel dizzy. This can increase your chance of falling. Ask your doctor what other things that you can do to help prevent falls. This information is not intended to replace advice given to you by your health care provider. Make sure you discuss any questions you have with your health care provider. Document Released: 08/05/2009 Document Revised: 03/16/2016 Document Reviewed: 11/13/2014 Elsevier Interactive Patient Education  2017 [employer].

## 2021-04-04 ENCOUNTER — Other Ambulatory Visit: Payer: Self-pay | Admitting: Family Medicine

## 2021-04-04 DIAGNOSIS — G894 Chronic pain syndrome: Secondary | ICD-10-CM

## 2021-04-04 DIAGNOSIS — G8929 Other chronic pain: Secondary | ICD-10-CM

## 2021-04-04 MED ORDER — HYDROCODONE-ACETAMINOPHEN 10-325 MG PO TABS: 1.0000 | ORAL_TABLET | Freq: Three times a day (TID) | ORAL | 0 refills | Status: DC | PRN

## 2021-04-15 ENCOUNTER — Encounter: Payer: Self-pay | Admitting: Family Medicine

## 2021-05-05 ENCOUNTER — Other Ambulatory Visit: Payer: Self-pay | Admitting: Family Medicine

## 2021-05-05 DIAGNOSIS — G8929 Other chronic pain: Secondary | ICD-10-CM

## 2021-05-05 DIAGNOSIS — G894 Chronic pain syndrome: Secondary | ICD-10-CM

## 2021-05-05 MED ORDER — HYDROCODONE-ACETAMINOPHEN 10-325 MG PO TABS: 1.0000 | ORAL_TABLET | Freq: Three times a day (TID) | ORAL | 0 refills | Status: DC | PRN

## 2021-05-05 NOTE — Telephone Encounter (Signed)
Requesting: hydrocodone 10-325mg  Contract: 05/31/2020 UDS: 05/31/2020 Last Visit: 03/14/2021 Next Visit: None Last Refill: 04/04/2021 #60 and 0RF  Please Advise

## 2021-05-05 NOTE — Telephone Encounter (Signed)
Medication: HYDROcodone-acetaminophen (NORCO) 10-325 MG tablet   Has the patient contacted their pharmacy? No. (If no, request that the patient contact the pharmacy for the refill.) (If yes, when and what did the pharmacy advise?)  Preferred Pharmacy (with phone number or street name):  [place], [place] - [address] [place], [place] [address]  Phone:  [phone]  Fax:  [phone]   Agent: Please be advised that RX refills may take up to 3 business days. We ask that you follow-up with your pharmacy.

## 2021-05-13 ENCOUNTER — Other Ambulatory Visit: Payer: Self-pay

## 2021-05-13 MED ORDER — ROSUVASTATIN CALCIUM 40 MG PO TABS: 40.0000 mg | ORAL_TABLET | Freq: Every day | ORAL | 0 refills | Status: DC

## 2021-05-14 ENCOUNTER — Ambulatory Visit
Admission: RE | Admit: 2021-05-14 | Discharge: 2021-05-14 | Disposition: A | Discharge status: Home / Self Care | Payer: Medicare HMO | Source: Ambulatory Visit | Attending: Family Medicine | Admitting: Family Medicine

## 2021-05-14 DIAGNOSIS — Z1231 Encounter for screening mammogram for malignant neoplasm of breast: Secondary | ICD-10-CM

## 2021-05-18 NOTE — Telephone Encounter (Signed)
Lvm for patient to call and set up VV appt

## 2021-05-20 ENCOUNTER — Other Ambulatory Visit: Payer: Self-pay

## 2021-05-20 ENCOUNTER — Telehealth (INDEPENDENT_AMBULATORY_CARE_PROVIDER_SITE_OTHER): Payer: Medicare HMO | Admitting: Family Medicine

## 2021-05-20 ENCOUNTER — Encounter: Payer: Self-pay | Admitting: Family Medicine

## 2021-05-20 DIAGNOSIS — M797 Fibromyalgia: Secondary | ICD-10-CM | POA: Diagnosis not present

## 2021-05-20 NOTE — Progress Notes (Addendum)
MyChart Video Visit    Virtual Visit via Video Note   This visit type was conducted due to national recommendations for restrictions regarding the COVID-19 Pandemic (e.g. social distancing) in an effort to limit this patient's exposure and mitigate transmission in our community. This patient is at least at moderate risk for complications without adequate follow up. This format is felt to be most appropriate for this patient at this time. Physical exam was limited by quality of the video and audio technology used for the visit. Marin Roberts  was able to get the patient set up on a video visit.  Patient location: Home Patient and provider in visit Provider location: Office  I discussed the limitations of evaluation and management by telemedicine and the availability of in person appointments. The patient expressed understanding and agreed to proceed.  Visit Date: 05/20/2021  Today's healthcare provider: [name], DO     Subjective:    Patient ID: Emily Maddox, female    DOB: 08/20/1971, 50 y.o.   MRN: [id number]  Chief Complaint  Patient presents with   Follow-up    To have form completed    HPI Patient is in today for for fmla and accommodation to work from home due to fibromyalgia and severe pain with long travel times .  Pt lives in [place] and the job is in [place] , [place] .   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   Diabetes mellitus    Hyperlipidemia    Tachycardia     Past Surgical History:  Procedure Laterality Date   BREAST EXCISIONAL BIOPSY Left 2003    Family History  Problem Relation Age of Onset   Cancer Mother 70       breast   Hypertension Mother    Hyperlipidemia Mother    Diabetes Mother    Breast cancer Mother 29   Cancer Father 40       colon   Hypertension Father    Hyperlipidemia  Father    Heart disease Father        a fib   Diabetes Father    Diabetes Sister    Breast cancer Other    Colon cancer Other     Social History   Socioeconomic History   Marital status: Single    Spouse name: Not on file   Number of children: Not on file   Years of education: Not on file   Highest education level: Not on file  Occupational History    Employer: UNEMPLOYED  Tobacco Use   Smoking status: Every Day    Packs/day: 1.00    Years: 19.00    Pack years: 19.00    Types: Cigarettes   Smokeless tobacco: Never   Tobacco comments:    couldn't tolerate chantix, cut down to 0.5 pack since 11/2013  Vaping Use   Vaping Use: Never used  Substance and Sexual Activity   Alcohol use: No   Drug use: No   Sexual activity: Yes    Partners: Female  Other Topics Concern   Not on file  Social History Narrative   Lives alone.     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 in business administration   Exercise--  3-4 x a week   Social Determinants of Health   Financial Resource Strain: Low  Risk    Difficulty of Paying Living Expenses: Not hard at all  Food Insecurity: No Food Insecurity   Worried About [place] in the Last Year: Never true   Ran Out of Food in the Last Year: Never true  Transportation Needs: No Transportation Needs   Lack of Transportation (Medical): No   Lack of Transportation (Non-Medical): No  Physical Activity: Sufficiently Active   Days of Exercise per Week: 3 days   Minutes of Exercise per Session: 50 min  Stress: Stress Concern Present   Feeling of Stress : To some extent  Social Connections: Moderately Isolated   Frequency of Communication with Friends and Family: More than three times a week   Frequency of Social Gatherings with Friends and Family: More than three times a week   Attends Religious Services: More than 4 times per year   Active Member of [employer] or Organizations: Not on file   Attends Archivist Meetings: Never    Marital Status: Never married  Human resources officer Violence: Not At Risk   Fear of Current or Ex-Partner: No   Emotionally Abused: No   Physically Abused: No   Sexually Abused: No    Outpatient Medications Prior to Visit  Medication Sig Dispense Refill   Blood Glucose Monitoring Suppl ([place]) w/Device KIT 1 each by Does not apply route daily. Dx code: E11.9 1 kit 0   glucose blood (ONETOUCH VERIO) test strip USE 1 STRIP TO CHECK GLUCOSE ONCE DAILY 100 each 12   HYDROcodone-acetaminophen (NORCO) 10-325 MG tablet Take 1 tablet by mouth every 8 (eight) hours as needed. 60 tablet 0   ONETOUCH DELICA LANCETS 67Y MISC Check sugar every day.  Dx Code: E11.9 100 each 1   Probiotic Product (PROBIOTIC DAILY PO) Take 1 capsule by mouth daily.     rosuvastatin (CRESTOR) 40 MG tablet Take 1 tablet (40 mg total) by mouth daily. 100 tablet 0   No facility-administered medications prior to visit.    Allergies  Allergen Reactions   Metoprolol Other (See Comments)    exhaustion   Iodinated Diagnostic Agents    Latex    Shellfish Allergy    Ultram [Tramadol] Palpitations    Review of Systems  Constitutional:  Negative for diaphoresis, fever and malaise/fatigue.  HENT:  Negative for congestion, ear discharge, nosebleeds and sore throat.   Eyes:  Negative for pain, discharge and redness.  Respiratory:  Negative for hemoptysis, sputum production and stridor.   Cardiovascular:  Negative for palpitations, orthopnea, claudication and leg swelling.  Gastrointestinal:  Negative for abdominal pain, diarrhea, nausea and vomiting.  Genitourinary:  Negative for frequency, hematuria and urgency.  Musculoskeletal:  Positive for back pain, joint pain, myalgias and neck pain. Negative for falls.  Skin:  Negative for rash.  Neurological:  Negative for focal weakness, seizures and weakness.  Psychiatric/Behavioral:  Negative for hallucinations and substance abuse.       Objective:     Physical Exam Vitals and nursing note reviewed.  Neurological:     Mental Status: She is alert and oriented to person, place, and time. Mental status is at baseline.  Psychiatric:        Behavior: Behavior normal.        Thought Content: Thought content normal.    There were no vitals taken for this visit. Wt Readings from Last 3 Encounters:  03/31/21 180 lb (81.6 kg)  03/14/21 180 lb (81.6 kg)  10/27/20 170 lb (77.1  kg)    Diabetic Foot Exam - Simple   No data filed    Lab Results  Component Value Date   WBC 8.1 12/03/2018   HGB 12.9 12/03/2018   HCT 38.4 12/03/2018   PLT 222.0 12/03/2018   GLUCOSE 84 01/21/2021   CHOL 149 01/21/2021   TRIG 90.0 01/21/2021   HDL 58.60 01/21/2021   LDLDIRECT 143.7 11/12/2013   LDLCALC 73 01/21/2021   ALT 7 01/21/2021   AST 13 01/21/2021   NA 139 01/21/2021   K 4.7 01/21/2021   CL 104 01/21/2021   CREATININE 0.98 01/21/2021   BUN 12 01/21/2021   CO2 27 01/21/2021   TSH 1.99 01/21/2021   HGBA1C 5.9 01/21/2021   MICROALBUR <0.7 01/21/2021    Lab Results  Component Value Date   TSH 1.99 01/21/2021   Lab Results  Component Value Date   WBC 8.1 12/03/2018   HGB 12.9 12/03/2018   HCT 38.4 12/03/2018   MCV 98.0 12/03/2018   PLT 222.0 12/03/2018   Lab Results  Component Value Date   NA 139 01/21/2021   K 4.7 01/21/2021   CO2 27 01/21/2021   GLUCOSE 84 01/21/2021   BUN 12 01/21/2021   CREATININE 0.98 01/21/2021   BILITOT 0.3 01/21/2021   ALKPHOS 55 01/21/2021   AST 13 01/21/2021   ALT 7 01/21/2021   PROT 6.8 01/21/2021   ALBUMIN 4.1 01/21/2021   CALCIUM 9.1 01/21/2021   ANIONGAP 10 04/07/2018   GFR 68.30 01/21/2021   Lab Results  Component Value Date   CHOL 149 01/21/2021   Lab Results  Component Value Date   HDL 58.60 01/21/2021   Lab Results  Component Value Date   LDLCALC 73 01/21/2021   Lab Results  Component Value Date   TRIG 90.0 01/21/2021   Lab Results  Component Value Date   CHOLHDL 3  01/21/2021   Lab Results  Component Value Date   HGBA1C 5.9 01/21/2021       Assessment & Plan:   Problem List Items Addressed This Visit       Unprioritized   Fibromyalgia - Primary   Accommodation forms filled out for pt to work from home   No orders of the defined types were placed in this encounter.   I discussed the assessment and treatment plan with the patient. The patient was provided an opportunity to ask questions and all were answered. The patient agreed with the plan and demonstrated an understanding of the instructions.   The patient was advised to call back or seek an in-person evaluation if the symptoms worsen or if the condition fails to improve as anticipated.     [name], DO [facility] [phone] (phone) [phone] (fax)  [place]

## 2021-05-27 ENCOUNTER — Other Ambulatory Visit: Payer: Self-pay | Admitting: Family Medicine

## 2021-05-27 DIAGNOSIS — E1149 Type 2 diabetes mellitus with other diabetic neurological complication: Secondary | ICD-10-CM

## 2021-06-06 ENCOUNTER — Other Ambulatory Visit: Payer: Self-pay | Admitting: Family Medicine

## 2021-06-06 DIAGNOSIS — G894 Chronic pain syndrome: Secondary | ICD-10-CM

## 2021-06-06 DIAGNOSIS — G8929 Other chronic pain: Secondary | ICD-10-CM

## 2021-06-06 MED ORDER — HYDROCODONE-ACETAMINOPHEN 10-325 MG PO TABS: 1.0000 | ORAL_TABLET | Freq: Three times a day (TID) | ORAL | 0 refills | Status: DC | PRN

## 2021-06-06 NOTE — Telephone Encounter (Signed)
Requesting: NORCO Contract: 05/31/20 UDS: 05/31/2020 Last OV: 05/20/21 Next OV: N/A Last Refill:  05/05/21, #60--0 RF Database:   Please advise

## 2021-06-06 NOTE — Telephone Encounter (Signed)
Patient would like her HYDROcodone-acetaminophen (NORCO) 10-325 MG tablet  refilled.  Send to: [facility] [address], [place] - [address] [place], [place] [address]  Phone:  [phone]  Fax:  [phone]

## 2021-07-06 ENCOUNTER — Other Ambulatory Visit: Payer: Self-pay | Admitting: Family Medicine

## 2021-07-06 DIAGNOSIS — G8929 Other chronic pain: Secondary | ICD-10-CM

## 2021-07-06 DIAGNOSIS — G894 Chronic pain syndrome: Secondary | ICD-10-CM

## 2021-07-06 MED ORDER — HYDROCODONE-ACETAMINOPHEN 10-325 MG PO TABS: 1.0000 | ORAL_TABLET | Freq: Three times a day (TID) | ORAL | 0 refills | Status: DC | PRN

## 2021-07-06 NOTE — Telephone Encounter (Signed)
Medication: HYDROcodone-acetaminophen (NORCO) 10-325 MG tablet   Has the patient contacted their pharmacy? No. (If no, request that the patient contact the pharmacy for the refill.) (If yes, when and what did the pharmacy advise?)  Preferred Pharmacy (with phone number or street name): [place], [place] - [address] [place], [place] [address]  Phone:  [phone]  Fax:  [phone]  Agent: Please be advised that RX refills may take up to 3 business days. We ask that you follow-up with your pharmacy.

## 2021-07-06 NOTE — Telephone Encounter (Signed)
Requesting: NORCO Contract: 05/31/2020 UDS: 05/31/2020 Last OV: 05/20/21--VV Next OV: N/A Last Refill: 06/06/21, #60--0 RF Database:   Please advise

## 2021-08-05 ENCOUNTER — Other Ambulatory Visit: Payer: Self-pay

## 2021-08-05 DIAGNOSIS — G894 Chronic pain syndrome: Secondary | ICD-10-CM

## 2021-08-05 DIAGNOSIS — G8929 Other chronic pain: Secondary | ICD-10-CM

## 2021-08-05 MED ORDER — HYDROCODONE-ACETAMINOPHEN 10-325 MG PO TABS: 1.0000 | ORAL_TABLET | Freq: Three times a day (TID) | ORAL | 0 refills | Status: DC | PRN

## 2021-08-05 NOTE — Telephone Encounter (Signed)
Last RX:07-06-21 #60 Last OV: video visit 05-20-21 Next OV:04-06-2022 UDS: 05-31-20 CSC:18-06-2020

## 2021-08-05 NOTE — Telephone Encounter (Signed)
Patient scheduled for 09-01-21

## 2021-09-01 ENCOUNTER — Encounter: Payer: Self-pay | Admitting: Family Medicine

## 2021-09-01 ENCOUNTER — Other Ambulatory Visit: Payer: Self-pay

## 2021-09-01 ENCOUNTER — Ambulatory Visit (INDEPENDENT_AMBULATORY_CARE_PROVIDER_SITE_OTHER): Payer: Medicare HMO | Admitting: Family Medicine

## 2021-09-01 VITALS — BP 116/88 | HR 110 | Temp 97.5°F | Resp 18 | Ht 65.0 in | Wt 187.0 lb

## 2021-09-01 DIAGNOSIS — R739 Hyperglycemia, unspecified: Secondary | ICD-10-CM

## 2021-09-01 DIAGNOSIS — E1149 Type 2 diabetes mellitus with other diabetic neurological complication: Secondary | ICD-10-CM

## 2021-09-01 DIAGNOSIS — M51369 Other intervertebral disc degeneration, lumbar region without mention of lumbar back pain or lower extremity pain: Secondary | ICD-10-CM

## 2021-09-01 DIAGNOSIS — M5136 Other intervertebral disc degeneration, lumbar region: Secondary | ICD-10-CM

## 2021-09-01 DIAGNOSIS — G8929 Other chronic pain: Secondary | ICD-10-CM | POA: Diagnosis not present

## 2021-09-01 DIAGNOSIS — E1169 Type 2 diabetes mellitus with other specified complication: Secondary | ICD-10-CM | POA: Diagnosis not present

## 2021-09-01 DIAGNOSIS — G894 Chronic pain syndrome: Secondary | ICD-10-CM | POA: Diagnosis not present

## 2021-09-01 DIAGNOSIS — Z23 Encounter for immunization: Secondary | ICD-10-CM

## 2021-09-01 DIAGNOSIS — Z79899 Other long term (current) drug therapy: Secondary | ICD-10-CM

## 2021-09-01 DIAGNOSIS — E785 Hyperlipidemia, unspecified: Secondary | ICD-10-CM | POA: Diagnosis not present

## 2021-09-01 DIAGNOSIS — M797 Fibromyalgia: Secondary | ICD-10-CM | POA: Diagnosis not present

## 2021-09-01 DIAGNOSIS — I10 Essential (primary) hypertension: Secondary | ICD-10-CM

## 2021-09-01 LAB — LIPID PANEL
Cholesterol: 163 mg/dL (ref 0–200)
HDL: 57.1 mg/dL (ref >39.00)
LDL Cholesterol: 89 mg/dL (ref 0–99)
NonHDL: 105.53
Total CHOL/HDL Ratio: 3
Triglycerides: 81 mg/dL (ref 0.0–149.0)
VLDL: 16.2 mg/dL (ref 0.0–40.0)

## 2021-09-01 LAB — COMPREHENSIVE METABOLIC PANEL
ALT: 10 U/L (ref 0–35)
AST: 16 U/L (ref 0–37)
Albumin: 4.2 g/dL (ref 3.5–5.2)
Alkaline Phosphatase: 54 U/L (ref 39–117)
BUN: 12 mg/dL (ref 6–23)
CO2: 29 mEq/L (ref 19–32)
Calcium: 9.1 mg/dL (ref 8.4–10.5)
Chloride: 103 mEq/L (ref 96–112)
Creatinine, Ser: 0.89 mg/dL (ref 0.40–1.20)
GFR: 76.34 mL/min (ref >60.00)
Glucose, Bld: 91 mg/dL (ref 70–99)
Potassium: 4.8 mEq/L (ref 3.5–5.1)
Sodium: 138 mEq/L (ref 135–145)
Total Bilirubin: 0.3 mg/dL (ref 0.2–1.2)
Total Protein: 6.8 g/dL (ref 6.0–8.3)

## 2021-09-01 LAB — HEMOGLOBIN A1C: Hgb A1c MFr Bld: 6.7 % — ABNORMAL HIGH (ref 4.6–6.5)

## 2021-09-01 MED ORDER — HYDROCODONE-ACETAMINOPHEN 10-325 MG PO TABS: 1.0000 | ORAL_TABLET | Freq: Three times a day (TID) | ORAL | 0 refills | Status: DC | PRN

## 2021-09-01 NOTE — Patient Instructions (Signed)

## 2021-09-01 NOTE — Progress Notes (Signed)
Subjective:   By signing my name below, I, Emily Maddox, attest that this documentation has been prepared under the direction and in the presence of Dr. [name], DO. 09/01/2021    Patient ID: Emily Maddox, female    DOB: 1970/12/19, 50 y.o.   MRN: [id number]  Chief Complaint  Patient presents with   Pain   Follow-up    HPI Patient is in today for a office visit.  Fibromyalgia/Chronic Back pain- She continues having pain from her fibromyalgia and back. She is requesting a refill on 10-325 mg hydrocodone-acetaminophen every 8 hours PRN.  Hot flashes- She complains of hot flashes for the past couple of months. She reports no having a menstrual cycle for the past 2 months. She reports having similar symptoms last year but found it resolved shortly after. She is managing it on her own at this time.  Immunizations- She is interested in receiving the flu vaccine during this visit. She has 2 Covid-19 vaccines and was informed of the bivalent Covid-19 vaccine. She is interested in receiving it at a later time.    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   Diabetes mellitus    Hyperlipidemia    Tachycardia     Past Surgical History:  Procedure Laterality Date   BREAST EXCISIONAL BIOPSY Left 2003    Family History  Problem Relation Age of Onset   Cancer Mother 37       breast   Hypertension Mother    Hyperlipidemia Mother    Diabetes Mother    Breast cancer Mother 22   Cancer Father 10       colon   Hypertension Father    Hyperlipidemia Father    Heart disease Father        a fib   Diabetes Father    Diabetes Sister    Breast cancer Other    Colon cancer Other     Social History   Socioeconomic History   Marital status: Single    Spouse name: Not on file   Number of children: Not on file   Years of education: Not on file   Highest education level: Not on file   Occupational History    Employer: UNEMPLOYED  Tobacco Use   Smoking status: Every Day    Packs/day: 1.00    Years: 19.00    Pack years: 19.00    Types: Cigarettes   Smokeless tobacco: Never   Tobacco comments:    couldn't tolerate chantix, cut down to 0.5 pack since 11/2013  Vaping Use   Vaping Use: Never used  Substance and Sexual Activity   Alcohol use: No   Drug use: No   Sexual activity: Yes    Partners: Female  Other Topics Concern   Not on file  Social History Narrative   Lives alone.     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 in business administration   Exercise--  3-4 x a week   Social Determinants of Health   Financial Resource Strain: Low Risk    Difficulty of Paying Living Expenses: Not hard at all  Food Insecurity: No Food Insecurity   Worried About Programme researcher, broadcasting/film/video in the Last Year: Never true   Barista in the Last Year: Never true  Transportation Needs: No Transportation Needs   Lack of Transportation (Medical): No   Lack of Transportation (Non-Medical): No  Physical Activity: Sufficiently Active   Days of Exercise per Week: 3 days   Minutes of Exercise per Session: 50 min  Stress: Stress Concern Present   Feeling of Stress : To some extent  Social Connections: Moderately Isolated   Frequency of Communication with Friends and Family: More than three times a week   Frequency of Social Gatherings with Friends and Family: More than three times a week   Attends Religious Services: More than 4 times per year   Active Member of [employer] or Organizations: Not on file   Attends Archivist Meetings: Never   Marital Status: Never married  Human resources officer Violence: Not At Risk   Fear of Current or Ex-Partner: No   Emotionally Abused: No   Physically Abused: No   Sexually Abused: No    Outpatient Medications Prior to Visit  Medication Sig Dispense Refill   Blood Glucose Monitoring Suppl ([place]) w/Device KIT 1 each by Does  not apply route daily. Dx code: E11.9 1 kit 0   glucose blood (ONETOUCH VERIO) test strip USE 1 STRIP TO CHECK GLUCOSE ONCE DAILY 100 each 12   ONETOUCH DELICA LANCETS 17O MISC Check sugar every day.  Dx Code: E11.9 100 each 1   Probiotic Product (PROBIOTIC DAILY PO) Take 1 capsule by mouth daily.     rosuvastatin (CRESTOR) 40 MG tablet Take 1 tablet (40 mg total) by mouth daily. 100 tablet 0   HYDROcodone-acetaminophen (NORCO) 10-325 MG tablet Take 1 tablet by mouth every 8 (eight) hours as needed. 60 tablet 0   No facility-administered medications prior to visit.    Allergies  Allergen Reactions   Metoprolol Other (See Comments)    exhaustion   Iodinated Diagnostic Agents    Latex    Shellfish Allergy    Ultram [Tramadol] Palpitations    Review of Systems  Constitutional:  Negative for fever and malaise/fatigue.  HENT:  Negative for congestion.   Eyes:  Negative for blurred vision.  Respiratory:  Negative for shortness of breath.   Cardiovascular:  Negative for chest pain, palpitations and leg swelling.  Gastrointestinal:  Negative for abdominal pain, blood in stool and nausea.  Genitourinary:  Negative for dysuria and frequency.  Musculoskeletal:  Negative for falls.  Skin:  Negative for rash.  Neurological:  Negative for dizziness, loss of consciousness and headaches.  Endo/Heme/Allergies:  Negative for environmental allergies.  Psychiatric/Behavioral:  Negative for depression. The patient is not nervous/anxious.       Objective:    Physical Exam Vitals and nursing note reviewed.  Constitutional:      General: She is not in acute distress.    Appearance: Normal appearance. She is not ill-appearing.  HENT:     Head: Normocephalic and atraumatic.     Right Ear: External ear normal.     Left Ear: External ear normal.  Eyes:     Extraocular Movements: Extraocular movements intact.     Pupils: Pupils are equal, round, and reactive to light.  Cardiovascular:     Rate  and Rhythm: Normal rate and regular rhythm.     Heart sounds: Normal heart sounds. No murmur heard.   No gallop.  Pulmonary:     Effort: Pulmonary effort is normal. No respiratory distress.     Breath sounds: Normal breath sounds. No wheezing or rales.  Skin:    General: Skin is warm and dry.  Neurological:     Mental Status: She is alert and oriented to  person, place, and time.  Psychiatric:        Behavior: Behavior normal.        Judgment: Judgment normal.    BP 116/88 (BP Location: Right Arm, Patient Position: Sitting, Cuff Size: Normal)   Pulse (!) 110   Temp (!) 97.5 F (36.4 C) (Oral)   Resp 18   Ht 5\' 5"  (1.651 m)   Wt 187 lb (84.8 kg)   SpO2 98%   BMI 31.12 kg/m  Wt Readings from Last 3 Encounters:  09/01/21 187 lb (84.8 kg)  03/31/21 180 lb (81.6 kg)  03/14/21 180 lb (81.6 kg)    Diabetic Foot Exam - Simple   No data filed    Lab Results  Component Value Date   WBC 8.1 12/03/2018   HGB 12.9 12/03/2018   HCT 38.4 12/03/2018   PLT 222.0 12/03/2018   GLUCOSE 84 01/21/2021   CHOL 149 01/21/2021   TRIG 90.0 01/21/2021   HDL 58.60 01/21/2021   LDLDIRECT 143.7 11/12/2013   LDLCALC 73 01/21/2021   ALT 7 01/21/2021   AST 13 01/21/2021   NA 139 01/21/2021   K 4.7 01/21/2021   CL 104 01/21/2021   CREATININE 0.98 01/21/2021   BUN 12 01/21/2021   CO2 27 01/21/2021   TSH 1.99 01/21/2021   HGBA1C 5.9 01/21/2021   MICROALBUR <0.7 01/21/2021    Lab Results  Component Value Date   TSH 1.99 01/21/2021   Lab Results  Component Value Date   WBC 8.1 12/03/2018   HGB 12.9 12/03/2018   HCT 38.4 12/03/2018   MCV 98.0 12/03/2018   PLT 222.0 12/03/2018   Lab Results  Component Value Date   NA 139 01/21/2021   K 4.7 01/21/2021   CO2 27 01/21/2021   GLUCOSE 84 01/21/2021   BUN 12 01/21/2021   CREATININE 0.98 01/21/2021   BILITOT 0.3 01/21/2021   ALKPHOS 55 01/21/2021   AST 13 01/21/2021   ALT 7 01/21/2021   PROT 6.8 01/21/2021   ALBUMIN 4.1 01/21/2021    CALCIUM 9.1 01/21/2021   ANIONGAP 10 04/07/2018   GFR 68.30 01/21/2021   Lab Results  Component Value Date   CHOL 149 01/21/2021   Lab Results  Component Value Date   HDL 58.60 01/21/2021   Lab Results  Component Value Date   LDLCALC 73 01/21/2021   Lab Results  Component Value Date   TRIG 90.0 01/21/2021   Lab Results  Component Value Date   CHOLHDL 3 01/21/2021   Lab Results  Component Value Date   HGBA1C 5.9 01/21/2021       Assessment & Plan:   Problem List Items Addressed This Visit       Unprioritized   High risk medication use   Relevant Orders   Drug Monitoring Panel [phone] , Urine   Hyperglycemia   Relevant Orders   Lipid panel   Comprehensive metabolic panel   Hemoglobin A1c   Chronic pain syndrome - Primary   Relevant Medications   HYDROcodone-acetaminophen (NORCO) 10-325 MG tablet   Diabetes mellitus type II, controlled (HCC)    Check labs  con't diet       Essential hypertension    Well controlled, no changes to meds. Encouraged heart healthy diet such as the DASH diet and exercise as tolerated.       Fibromyalgia    Stable con't prn pain med      Relevant Medications   HYDROcodone-acetaminophen (NORCO) 10-325 MG tablet  Hyperlipidemia    Encourage heart healthy diet such as MIND or DASH diet, increase exercise, avoid trans fats, simple carbohydrates and processed foods, consider a krill or fish or flaxseed oil cap daily.       Relevant Orders   Lipid panel   Comprehensive metabolic panel   Hemoglobin A1c   Hyperlipidemia associated with type 2 diabetes mellitus (HCC)    Encourage heart healthy diet such as MIND or DASH diet, increase exercise, avoid trans fats, simple carbohydrates and processed foods, consider a krill or fish or flaxseed oil cap daily.       Lumbar degenerative disc disease    uds updated  Contract updated        Relevant Medications   HYDROcodone-acetaminophen (NORCO) 10-325 MG tablet   Other  Visit Diagnoses     Other chronic pain       Relevant Medications   HYDROcodone-acetaminophen (NORCO) 10-325 MG tablet   Other Relevant Orders   Lipid panel   Comprehensive metabolic panel   Hemoglobin A1c   Need for influenza vaccination       Relevant Orders   Flu Vaccine QUAD 26mo+IM (Fluarix, Fluzone & Alfiuria Quad PF) (Completed)        Meds ordered this encounter  Medications   HYDROcodone-acetaminophen (NORCO) 10-325 MG tablet    Sig: Take 1 tablet by mouth every 8 (eight) hours as needed.    Dispense:  60 tablet    Refill:  0    I, Dr. Roma Schanz, DO, personally preformed the services described in this documentation.  All medical record entries made by the scribe were at my direction and in my presence.  I have reviewed the chart and discharge instructions (if applicable) and agree that the record reflects my personal performance and is accurate and complete. 09/01/2021   I,Emily Maddox,acting as a scribe for [name], DO.,have documented all relevant documentation on the behalf of [name], DO,as directed by  [name], DO while in the presence of [name], DO.   [name], DO

## 2021-09-01 NOTE — Assessment & Plan Note (Signed)
Encourage heart healthy diet such as MIND or DASH diet, increase exercise, avoid trans fats, simple carbohydrates and processed foods, consider a krill or fish or flaxseed oil cap daily.  °

## 2021-09-01 NOTE — Assessment & Plan Note (Signed)
uds updated  Contract updated

## 2021-09-01 NOTE — Assessment & Plan Note (Signed)
Stable con't prn pain med

## 2021-09-01 NOTE — Assessment & Plan Note (Signed)
Well controlled, no changes to meds. Encouraged heart healthy diet such as the DASH diet and exercise as tolerated.  °

## 2021-09-01 NOTE — Assessment & Plan Note (Signed)
Check labs  con't diet 

## 2021-09-03 LAB — DRUG MONITORING PANEL 376104, URINE
Amphetamines: NEGATIVE ng/mL (ref <500)
Barbiturates: NEGATIVE ng/mL (ref <300)
Benzodiazepines: NEGATIVE ng/mL (ref <100)
Cocaine Metabolite: NEGATIVE ng/mL (ref <150)
Codeine: NEGATIVE ng/mL (ref <50)
Desmethyltramadol: NEGATIVE ng/mL (ref <100)
Hydrocodone: 306 ng/mL — ABNORMAL HIGH (ref <50)
Hydromorphone: 310 ng/mL — ABNORMAL HIGH (ref <50)
Morphine: NEGATIVE ng/mL (ref <50)
Norhydrocodone: 976 ng/mL — ABNORMAL HIGH (ref <50)
Opiates: POSITIVE ng/mL — AB (ref <100)
Oxycodone: NEGATIVE ng/mL (ref <100)
Tramadol: NEGATIVE ng/mL (ref <100)

## 2021-09-03 LAB — DM TEMPLATE

## 2021-10-03 ENCOUNTER — Other Ambulatory Visit: Payer: Self-pay | Admitting: Family Medicine

## 2021-10-03 DIAGNOSIS — G894 Chronic pain syndrome: Secondary | ICD-10-CM

## 2021-10-03 DIAGNOSIS — G8929 Other chronic pain: Secondary | ICD-10-CM

## 2021-10-03 MED ORDER — HYDROCODONE-ACETAMINOPHEN 10-325 MG PO TABS: 1.0000 | ORAL_TABLET | Freq: Three times a day (TID) | ORAL | 0 refills | Status: DC | PRN

## 2021-10-03 NOTE — Telephone Encounter (Signed)
Medication: HYDROcodone-acetaminophen (NORCO) 10-325 MG tablet   Has the patient contacted their pharmacy? No. (If no, request that the patient contact the pharmacy for the refill.) (If yes, when and what did the pharmacy advise?)  Preferred Pharmacy (with phone number or street name): [place], [place] - [address] [place], [place] [address]  Phone:  [phone]  Fax:  [phone]   Agent: Please be advised that RX refills may take up to 3 business days. We ask that you follow-up with your pharmacy.

## 2021-10-03 NOTE — Telephone Encounter (Signed)
Requesting: NORCO Contract: 09/01/2021 UDS: 09/01/2021 Last OV: 09/01/2021 Next OV: N/A Last Refill: 09/01/2021, #[id number] RF Database:   Please advise

## 2021-10-11 ENCOUNTER — Telehealth: Payer: Self-pay | Admitting: Family Medicine

## 2021-10-11 NOTE — Telephone Encounter (Signed)
Pt states pharmacy said there was a manufacture problem and they are unable to fill rx and she has been out for a week and would like to know what can be done.   Medication: HYDROcodone-acetaminophen (NORCO) 10-325 MG tablet  Has the patient contacted their pharmacy? Yes.     Preferred Pharmacy: [facility] [address], [place] - [address] [place], [place]  [address]  Phone:  [phone]  Fax:  [phone]

## 2021-10-14 ENCOUNTER — Other Ambulatory Visit: Payer: Self-pay | Admitting: Family Medicine

## 2021-10-14 ENCOUNTER — Telehealth: Payer: Self-pay | Admitting: Family Medicine

## 2021-10-14 DIAGNOSIS — G8929 Other chronic pain: Secondary | ICD-10-CM

## 2021-10-14 DIAGNOSIS — G894 Chronic pain syndrome: Secondary | ICD-10-CM

## 2021-10-14 MED ORDER — OXYCODONE-ACETAMINOPHEN 5-325 MG PO TABS
1.0000 | ORAL_TABLET | ORAL | 0 refills | Status: DC | PRN
Start: 2021-10-14 — End: 2021-10-14

## 2021-10-14 MED ORDER — OXYCODONE HCL 5 MG PO TABS: 5.0000 mg | ORAL_TABLET | ORAL | 0 refills | Status: DC | PRN

## 2021-10-14 NOTE — Telephone Encounter (Signed)
See other message

## 2021-10-14 NOTE — Telephone Encounter (Signed)
Pharmacy states they are out of  oxyCODONE-acetaminophen (PERCOCET/ROXICET) 5-325 MG tablet and only have plain oxycodone 5mg .   [place], [place] - [address] [place], [place] [address]  Phone:  [phone]  Fax:  [phone]

## 2021-10-14 NOTE — Telephone Encounter (Signed)
Noted  

## 2021-10-14 NOTE — Telephone Encounter (Signed)
Spoke with the pharmacy. The do not have any NORCO in any dose. Please advise

## 2021-11-02 ENCOUNTER — Other Ambulatory Visit: Payer: Self-pay | Admitting: Family Medicine

## 2021-11-16 DIAGNOSIS — Z20822 Contact with and (suspected) exposure to covid-19: Secondary | ICD-10-CM | POA: Diagnosis not present

## 2021-12-22 ENCOUNTER — Other Ambulatory Visit: Payer: Self-pay | Admitting: Family Medicine

## 2021-12-22 DIAGNOSIS — G8929 Other chronic pain: Secondary | ICD-10-CM

## 2021-12-22 MED ORDER — OXYCODONE HCL 5 MG PO TABS: 5.0000 mg | ORAL_TABLET | ORAL | 0 refills | Status: DC | PRN

## 2021-12-22 NOTE — Telephone Encounter (Signed)
Requesting: Oxycodone ?Contract: 09/01/2021 ?UDS: 09/01/21 ?Last OV: 09/01/21 ?Next OV: N/A ?Last Refill:  ?Database: ? ? ?Please advise  ? ?

## 2021-12-22 NOTE — Telephone Encounter (Signed)
Medication:  ?oxyCODONE-acetaminophen (PERCOCET/ROXICET) 5-325 MG tablet  ?  ? ?Has the patient contacted their pharmacy? No. ?(If no, request that the patient contact the pharmacy for the refill.) ?(If yes, when and what did the pharmacy advise?) ? ?  ? ?Preferred Pharmacy (with phone number or street name):  ?[facility] [address] [place], [place] [address]  ?Phone:  [phone]  Fax:  [phone]  ?  ? ?Agent: Please be advised that RX refills may take up to 3 business days. We ask that you follow-up with your pharmacy. ? ?

## 2021-12-27 ENCOUNTER — Other Ambulatory Visit: Payer: Self-pay | Admitting: Family Medicine

## 2021-12-27 ENCOUNTER — Other Ambulatory Visit (HOSPITAL_BASED_OUTPATIENT_CLINIC_OR_DEPARTMENT_OTHER): Payer: Self-pay

## 2021-12-27 MED ORDER — HYDROCODONE-ACETAMINOPHEN 10-325 MG PO TABS: 1.0000 | ORAL_TABLET | Freq: Four times a day (QID) | ORAL | 0 refills | Status: DC | PRN

## 2021-12-27 MED ORDER — HYDROCODONE-ACETAMINOPHEN 10-325 MG PO TABS
1.0000 | ORAL_TABLET | Freq: Four times a day (QID) | ORAL | 0 refills | Status: DC | PRN
  Filled 2021-12-27: qty 60, 15d supply, fill #0

## 2021-12-27 NOTE — Telephone Encounter (Signed)
Patient called back, states she does not like the oxcycode and would like her old prescription of hydrocone [facility] since they do have it in stock. Please advise.  ?

## 2021-12-27 NOTE — Telephone Encounter (Signed)
[facility] the hydrocodone is on back order.  The pharmacy [facility] has it in [place].  Can you resend there please? ?

## 2021-12-28 NOTE — Telephone Encounter (Signed)
Patient stated that [facility] called and said they had it so rx was cancelled at [facility]. ?

## 2022-01-02 ENCOUNTER — Ambulatory Visit (INDEPENDENT_AMBULATORY_CARE_PROVIDER_SITE_OTHER): Payer: Medicare HMO | Admitting: Family Medicine

## 2022-01-02 ENCOUNTER — Encounter: Payer: Self-pay | Admitting: Family Medicine

## 2022-01-02 VITALS — BP 120/90 | HR 102 | Temp 97.5°F | Resp 18 | Ht 65.0 in | Wt 189.6 lb

## 2022-01-02 DIAGNOSIS — E1165 Type 2 diabetes mellitus with hyperglycemia: Secondary | ICD-10-CM

## 2022-01-02 DIAGNOSIS — I10 Essential (primary) hypertension: Secondary | ICD-10-CM

## 2022-01-02 DIAGNOSIS — M51369 Other intervertebral disc degeneration, lumbar region without mention of lumbar back pain or lower extremity pain: Secondary | ICD-10-CM

## 2022-01-02 DIAGNOSIS — M5136 Other intervertebral disc degeneration, lumbar region: Secondary | ICD-10-CM

## 2022-01-02 DIAGNOSIS — Z79899 Other long term (current) drug therapy: Secondary | ICD-10-CM | POA: Diagnosis not present

## 2022-01-02 DIAGNOSIS — E1149 Type 2 diabetes mellitus with other diabetic neurological complication: Secondary | ICD-10-CM | POA: Diagnosis not present

## 2022-01-02 DIAGNOSIS — E1169 Type 2 diabetes mellitus with other specified complication: Secondary | ICD-10-CM | POA: Diagnosis not present

## 2022-01-02 DIAGNOSIS — E785 Hyperlipidemia, unspecified: Secondary | ICD-10-CM | POA: Diagnosis not present

## 2022-01-02 DIAGNOSIS — F5101 Primary insomnia: Secondary | ICD-10-CM

## 2022-01-02 DIAGNOSIS — R69 Illness, unspecified: Secondary | ICD-10-CM | POA: Diagnosis not present

## 2022-01-02 LAB — HEMOGLOBIN A1C: Hgb A1c MFr Bld: 6.4 % (ref 4.6–6.5)

## 2022-01-02 LAB — COMPREHENSIVE METABOLIC PANEL
ALT: 8 U/L (ref 0–35)
AST: 13 U/L (ref 0–37)
Albumin: 4.3 g/dL (ref 3.5–5.2)
Alkaline Phosphatase: 53 U/L (ref 39–117)
BUN: 12 mg/dL (ref 6–23)
CO2: 28 mEq/L (ref 19–32)
Calcium: 9.3 mg/dL (ref 8.4–10.5)
Chloride: 105 mEq/L (ref 96–112)
Creatinine, Ser: 0.91 mg/dL (ref 0.40–1.20)
GFR: 74.15 mL/min (ref >60.00)
Glucose, Bld: 84 mg/dL (ref 70–99)
Potassium: 4.5 mEq/L (ref 3.5–5.1)
Sodium: 139 mEq/L (ref 135–145)
Total Bilirubin: 0.3 mg/dL (ref 0.2–1.2)
Total Protein: 6.8 g/dL (ref 6.0–8.3)

## 2022-01-02 LAB — LIPID PANEL
Cholesterol: 179 mg/dL (ref 0–200)
HDL: 66.3 mg/dL (ref >39.00)
LDL Cholesterol: 95 mg/dL (ref 0–99)
NonHDL: 112.29
Total CHOL/HDL Ratio: 3
Triglycerides: 88 mg/dL (ref 0.0–149.0)
VLDL: 17.6 mg/dL (ref 0.0–40.0)

## 2022-01-02 LAB — MICROALBUMIN / CREATININE URINE RATIO
Creatinine,U: 166.1 mg/dL
Microalb Creat Ratio: 0.4 mg/g (ref 0.0–30.0)
Microalb, Ur: <0.7 mg/dL (ref 0.0–1.9)

## 2022-01-02 LAB — CBC WITH DIFFERENTIAL/PLATELET
Basophils Absolute: 0.1 K/uL (ref 0.0–0.1)
Basophils Relative: 0.8 % (ref 0.0–3.0)
Eosinophils Absolute: 0.1 K/uL (ref 0.0–0.7)
Eosinophils Relative: 1.7 % (ref 0.0–5.0)
HCT: 41.3 % (ref 36.0–46.0)
Hemoglobin: 13.7 g/dL (ref 12.0–15.0)
Lymphocytes Relative: 26 % (ref 12.0–46.0)
Lymphs Abs: 1.9 K/uL (ref 0.7–4.0)
MCHC: 33.2 g/dL (ref 30.0–36.0)
MCV: 98.3 fl (ref 78.0–100.0)
Monocytes Absolute: 0.5 K/uL (ref 0.1–1.0)
Monocytes Relative: 7.1 % (ref 3.0–12.0)
Neutro Abs: 4.7 K/uL (ref 1.4–7.7)
Neutrophils Relative %: 64.4 % (ref 43.0–77.0)
Platelets: 245 K/uL (ref 150.0–400.0)
RBC: 4.2 Mil/uL (ref 3.87–5.11)
RDW: 13.6 % (ref 11.5–15.5)
WBC: 7.3 K/uL (ref 4.0–10.5)

## 2022-01-02 LAB — VITAMIN B12: Vitamin B-12: 246 pg/mL (ref 211–911)

## 2022-01-02 LAB — TSH: TSH: 1.97 u[IU]/mL (ref 0.35–5.50)

## 2022-01-02 NOTE — Assessment & Plan Note (Signed)
Well controlled, no changes to meds. Encouraged heart healthy diet such as the DASH diet and exercise as tolerated.  °

## 2022-01-02 NOTE — Assessment & Plan Note (Signed)
Tolerating statin, encouraged heart healthy diet, avoid trans fats, minimize simple carbs and saturated fats. Increase exercise as tolerated 

## 2022-01-02 NOTE — Assessment & Plan Note (Signed)
Check labs  hgba1c to be checked, minimize simple carbs. Increase exercise as tolerated. Continue current meds  

## 2022-01-02 NOTE — Patient Instructions (Signed)
Debrox for ear wax ? ? ? ? ? ?Insomnia ?Insomnia is a sleep disorder that makes it difficult to fall asleep or stay asleep. Insomnia can cause fatigue, low energy, difficulty concentrating, mood swings, and poor performance at work or school. ?There are three different ways to classify insomnia: ?Difficulty falling asleep. ?Difficulty staying asleep. ?Waking up too early in the morning. ?Any type of insomnia can be long-term (chronic) or short-term (acute). Both are common. Short-term insomnia usually lasts for three months or less. Chronic insomnia occurs at least three times a week for longer than three months. ?What are the causes? ?Insomnia may be caused by another condition, situation, or substance, such as: ?Anxiety. ?Certain medicines. ?Gastroesophageal reflux disease (GERD) or other gastrointestinal conditions. ?Asthma or other breathing conditions. ?Restless legs syndrome, sleep apnea, or other sleep disorders. ?Chronic pain. ?Menopause. ?Stroke. ?Abuse of alcohol, tobacco, or illegal drugs. ?Mental health conditions, such as depression. ?Caffeine. ?Neurological disorders, such as Alzheimer's disease. ?An overactive thyroid (hyperthyroidism). ?Sometimes, the cause of insomnia may not be known. ?What increases the risk? ?Risk factors for insomnia include: ?Gender. Women are affected more often than men. ?Age. Insomnia is more common as you get older. ?Stress. ?Lack of exercise. ?Irregular work schedule or working night shifts. ?Traveling between different time zones. ?Certain medical and mental health conditions. ?What are the signs or symptoms? ?If you have insomnia, the main symptom is having trouble falling asleep or having trouble staying asleep. This may lead to other symptoms, such as: ?Feeling fatigued or having low energy. ?Feeling nervous about going to sleep. ?Not feeling rested in the morning. ?Having trouble concentrating. ?Feeling irritable, anxious, or depressed. ?How is this diagnosed? ?This  condition may be diagnosed based on: ?Your symptoms and medical history. Your health care provider may ask about: ?Your sleep habits. ?Any medical conditions you have. ?Your mental health. ?A physical exam. ?How is this treated? ?Treatment for insomnia depends on the cause. Treatment may focus on treating an underlying condition that is causing insomnia. Treatment may also include: ?Medicines to help you sleep. ?Counseling or therapy. ?Lifestyle adjustments to help you sleep better. ?Follow these instructions at home: ?Eating and drinking ? ?Limit or avoid alcohol, caffeinated beverages, and cigarettes, especially close to bedtime. These can disrupt your sleep. ?Do not eat a large meal or eat spicy foods right before bedtime. This can lead to digestive discomfort that can make it hard for you to sleep. ?Sleep habits ? ?Keep a sleep diary to help you and your health care provider figure out what could be causing your insomnia. Write down: ?When you sleep. ?When you wake up during the night. ?How well you sleep. ?How rested you feel the next day. ?Any side effects of medicines you are taking. ?What you eat and drink. ?Make your bedroom a dark, comfortable place where it is easy to fall asleep. ?Put up shades or blackout curtains to block light from outside. ?Use a white noise machine to block noise. ?Keep the temperature cool. ?Limit screen use before bedtime. This includes: ?Watching TV. ?Using your smartphone, tablet, or computer. ?Stick to a routine that includes going to bed and waking up at the same times every day and night. This can help you fall asleep faster. Consider making a quiet activity, such as reading, part of your nighttime routine. ?Try to avoid taking naps during the day so that you sleep better at night. ?Get out of bed if you are still awake after 15 minutes of trying to sleep. Keep  the lights down, but try reading or doing a quiet activity. When you feel sleepy, go back to bed. ?General  instructions ?Take over-the-counter and prescription medicines only as told by your health care provider. ?Exercise regularly, as told by your health care provider. Avoid exercise starting several hours before bedtime. ?Use relaxation techniques to manage stress. Ask your health care provider to suggest some techniques that may work well for you. These may include: ?Breathing exercises. ?Routines to release muscle tension. ?Visualizing peaceful scenes. ?Make sure that you drive carefully. Avoid driving if you feel very sleepy. ?Keep all follow-up visits as told by your health care provider. This is important. ?Contact a health care provider if: ?You are tired throughout the day. ?You have trouble in your daily routine due to sleepiness. ?You continue to have sleep problems, or your sleep problems get worse. ?Get help right away if: ?You have serious thoughts about hurting yourself or someone else. ?If you ever feel like you may hurt yourself or others, or have thoughts about taking your own life, get help right away. You can go to your nearest emergency department or call: ?Your local emergency services (911 in the [place] ?A suicide crisis helpline, such as the [place] at (956) 425-3569 or 988 in the [place]. This is open 24 hours a day. ?Summary ?Insomnia is a sleep disorder that makes it difficult to fall asleep or stay asleep. ?Insomnia can be long-term (chronic) or short-term (acute). ?Treatment for insomnia depends on the cause. Treatment may focus on treating an underlying condition that is causing insomnia. ?Keep a sleep diary to help you and your health care provider figure out what could be causing your insomnia. ?This information is not intended to replace advice given to you by your health care provider. Make sure you discuss any questions you have with your health care provider. ?Document Revised: 05/04/2021 Document Reviewed: 08/19/2020 ?Elsevier Patient Education ? [place]. ? ?

## 2022-01-02 NOTE — Assessment & Plan Note (Signed)
uds updated  ?Database reviewed  ?

## 2022-01-02 NOTE — Progress Notes (Signed)
Subjective:   By signing my name below, I, Emily Maddox, attest that this documentation has been prepared under the direction and in the presence of Emily Spates DO, 01/02/2022   Patient ID: Emily Maddox, female    DOB: 10-13-1971, 51 y.o.   MRN: [id number]  No chief complaint on file.   HPI Patient is in today for an office visit.  Patient complains of intense exhaustion. She mentions that she occasionally falls asleep at work. When she eats a meal, she falls asleep for an hour but eventually wakes up. She states that she has an ongoing cycle of infrequent sleep. Her appetite has also increased.  She also complains of minor loss of hearing in the right ear.     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   Diabetes mellitus    Hyperlipidemia    Tachycardia     Past Surgical History:  Procedure Laterality Date   BREAST EXCISIONAL BIOPSY Left 2003    Family History  Problem Relation Age of Onset   Cancer Mother 65       breast   Hypertension Mother    Hyperlipidemia Mother    Diabetes Mother    Breast cancer Mother 37   Cancer Father 33       colon   Hypertension Father    Hyperlipidemia Father    Heart disease Father        a fib   Diabetes Father    Diabetes Sister    Breast cancer Other    Colon cancer Other     Social History   Socioeconomic History   Marital status: Single    Spouse name: Not on file   Number of children: Not on file   Years of education: Not on file   Highest education level: Not on file  Occupational History    Employer: UNEMPLOYED  Tobacco Use   Smoking status: Every Day    Packs/day: 1.00    Years: 19.00    Pack years: 19.00    Types: Cigarettes   Smokeless tobacco: Never   Tobacco comments:    couldn't tolerate chantix, cut down to 0.5 pack since 11/2013  Vaping Use   Vaping Use: Never used  Substance  and Sexual Activity   Alcohol use: No   Drug use: No   Sexual activity: Yes    Partners: Female  Other Topics Concern   Not on file  Social History Narrative   Lives alone.     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 in business administration   Exercise--  3-4 x a week   Social Determinants of Health   Financial Resource Strain: Low Risk    Difficulty of Paying Living Expenses: Not hard at all  Food Insecurity: No Food Insecurity   Worried About Programme researcher, broadcasting/film/video in the Last Year: Never true   Barista in the Last Year: Never true  Transportation Needs: No Transportation Needs   Lack of Transportation (Medical): No   Lack of Transportation (Non-Medical): No  Physical Activity: Sufficiently Active   Days of Exercise per Week: 3 days   Minutes of Exercise per Session: 50 min  Stress: Stress Concern Present   Feeling of Stress : To some extent  Social Connections: Moderately Isolated   Frequency of Communication with Friends and Family: More than three times a week   Frequency of Social  Gatherings with Friends and Family: More than three times a week   Attends Religious Services: More than 4 times per year   Active Member of [employer] or Organizations: Not on file   Attends Archivist Meetings: Never   Marital Status: Never married  Human resources officer Violence: Not At Risk   Fear of Current or Ex-Partner: No   Emotionally Abused: No   Physically Abused: No   Sexually Abused: No    Outpatient Medications Prior to Visit  Medication Sig Dispense Refill   Blood Glucose Monitoring Suppl ([place]) w/Device KIT 1 each by Does not apply route daily. Dx code: E11.9 1 kit 0   glucose blood (ONETOUCH VERIO) test strip USE 1 STRIP TO CHECK GLUCOSE ONCE DAILY 100 each 12   HYDROcodone-acetaminophen (NORCO) 10-325 MG tablet Take 1 tablet by mouth every 6 (six) hours as needed. 60 tablet 0   ONETOUCH DELICA LANCETS 03J MISC Check sugar  every day.  Dx Code: E11.9 100 each 1   Probiotic Product (PROBIOTIC DAILY PO) Take 1 capsule by mouth daily.     rosuvastatin (CRESTOR) 40 MG tablet Take 1 tablet by mouth once daily 30 tablet 0   No facility-administered medications prior to visit.    Allergies  Allergen Reactions   Metoprolol Other (See Comments)    exhaustion   Iodinated Contrast Media    Latex    Shellfish Allergy    Ultram [Tramadol] Palpitations    Review of Systems  Constitutional:  Positive for malaise/fatigue. Negative for fever.  HENT:  Positive for hearing loss (Right ear). Negative for congestion.   Eyes:  Negative for blurred vision.  Respiratory:  Negative for cough and shortness of breath.   Cardiovascular:  Negative for chest pain, palpitations and leg swelling.  Gastrointestinal:  Negative for vomiting.  Musculoskeletal:  Negative for back pain.  Skin:  Negative for rash.  Neurological:  Negative for loss of consciousness and headaches.  Psychiatric/Behavioral:  The patient has insomnia.       Objective:    Physical Exam Constitutional:      General: She is not in acute distress.    Appearance: Normal appearance. She is not ill-appearing.  HENT:     Head: Normocephalic and atraumatic.     Right Ear: External ear normal. There is impacted cerumen.     Left Ear: External ear normal.  Eyes:     Extraocular Movements: Extraocular movements intact.     Pupils: Pupils are equal, round, and reactive to light.  Cardiovascular:     Rate and Rhythm: Normal rate and regular rhythm.     Heart sounds: Normal heart sounds. No murmur heard.   No gallop.  Pulmonary:     Effort: Pulmonary effort is normal. No respiratory distress.     Breath sounds: Normal breath sounds. No wheezing or rales.  Skin:    General: Skin is warm and dry.  Neurological:     Mental Status: She is alert and oriented to person, place, and time.  Psychiatric:        Judgment: Judgment normal.    There were no  vitals taken for this visit. Wt Readings from Last 3 Encounters:  09/01/21 187 lb (84.8 kg)  03/31/21 180 lb (81.6 kg)  03/14/21 180 lb (81.6 kg)    Diabetic Foot Exam - Simple   No data filed    Lab Results  Component Value Date   WBC 8.1 12/03/2018   HGB  12.9 12/03/2018   HCT 38.4 12/03/2018   PLT 222.0 12/03/2018   GLUCOSE 91 09/01/2021   CHOL 163 09/01/2021   TRIG 81.0 09/01/2021   HDL 57.10 09/01/2021   LDLDIRECT 143.7 11/12/2013   LDLCALC 89 09/01/2021   ALT 10 09/01/2021   AST 16 09/01/2021   NA 138 09/01/2021   K 4.8 09/01/2021   CL 103 09/01/2021   CREATININE 0.89 09/01/2021   BUN 12 09/01/2021   CO2 29 09/01/2021   TSH 1.99 01/21/2021   HGBA1C 6.7 (H) 09/01/2021   MICROALBUR <0.7 01/21/2021    Lab Results  Component Value Date   TSH 1.99 01/21/2021   Lab Results  Component Value Date   WBC 8.1 12/03/2018   HGB 12.9 12/03/2018   HCT 38.4 12/03/2018   MCV 98.0 12/03/2018   PLT 222.0 12/03/2018   Lab Results  Component Value Date   NA 138 09/01/2021   K 4.8 09/01/2021   CO2 29 09/01/2021   GLUCOSE 91 09/01/2021   BUN 12 09/01/2021   CREATININE 0.89 09/01/2021   BILITOT 0.3 09/01/2021   ALKPHOS 54 09/01/2021   AST 16 09/01/2021   ALT 10 09/01/2021   PROT 6.8 09/01/2021   ALBUMIN 4.2 09/01/2021   CALCIUM 9.1 09/01/2021   ANIONGAP 10 04/07/2018   GFR 76.34 09/01/2021   Lab Results  Component Value Date   CHOL 163 09/01/2021   Lab Results  Component Value Date   HDL 57.10 09/01/2021   Lab Results  Component Value Date   LDLCALC 89 09/01/2021   Lab Results  Component Value Date   TRIG 81.0 09/01/2021   Lab Results  Component Value Date   CHOLHDL 3 09/01/2021   Lab Results  Component Value Date   HGBA1C 6.7 (H) 09/01/2021       Assessment & Plan:   Problem List Items Addressed This Visit   None     No orders of the defined types were placed in this encounter.   I, Carylon Perches, personally preformed the services  described in this documentation.  All medical record entries made by the scribe were at my direction and in my presence.  I have reviewed the chart and discharge instructions (if applicable) and agree that the record reflects my personal performance and is accurate and complete. 01/02/2022   I,Amber Collins,acting as a scribe for [employer], DO.,have documented all relevant documentation on the behalf of [name], DO,as directed by  [name], DO while in the presence of [name], DO.    [employer]

## 2022-01-04 LAB — DRUG MONITORING PANEL 376104, URINE
Amphetamines: NEGATIVE ng/mL (ref <500)
Barbiturates: NEGATIVE ng/mL (ref <300)
Benzodiazepines: NEGATIVE ng/mL (ref <100)
Cocaine Metabolite: NEGATIVE ng/mL (ref <150)
Codeine: NEGATIVE ng/mL (ref <50)
Desmethyltramadol: NEGATIVE ng/mL (ref <100)
Hydrocodone: 373 ng/mL — ABNORMAL HIGH (ref <50)
Hydromorphone: 437 ng/mL — ABNORMAL HIGH (ref <50)
Morphine: NEGATIVE ng/mL (ref <50)
Norhydrocodone: 1658 ng/mL — ABNORMAL HIGH (ref <50)
Opiates: POSITIVE ng/mL — AB (ref <100)
Oxycodone: NEGATIVE ng/mL (ref <100)
Tramadol: NEGATIVE ng/mL (ref <100)

## 2022-01-04 LAB — DM TEMPLATE

## 2022-01-05 ENCOUNTER — Encounter: Payer: Self-pay | Admitting: Family Medicine

## 2022-01-12 ENCOUNTER — Ambulatory Visit (INDEPENDENT_AMBULATORY_CARE_PROVIDER_SITE_OTHER): Payer: Medicare HMO

## 2022-01-12 DIAGNOSIS — E538 Deficiency of other specified B group vitamins: Secondary | ICD-10-CM

## 2022-01-12 MED ORDER — CYANOCOBALAMIN 1000 MCG/ML IJ SOLN
1000.0000 ug | Freq: Once | INTRAMUSCULAR | Status: AC
  Administered 2022-01-12: 1000 ug via INTRAMUSCULAR

## 2022-01-12 NOTE — Progress Notes (Signed)
Pt here for monthly B12 injection per Carollee Herter. ? ?B12 1060mg given IM, and pt tolerated injection well. RD  ? ?Next B12 injection scheduled for 02/13/22 at 9am.  ?

## 2022-01-30 ENCOUNTER — Other Ambulatory Visit: Payer: Self-pay | Admitting: Family Medicine

## 2022-01-30 MED ORDER — HYDROCODONE-ACETAMINOPHEN 10-325 MG PO TABS: 1.0000 | ORAL_TABLET | Freq: Four times a day (QID) | ORAL | 0 refills | Status: DC | PRN

## 2022-01-30 NOTE — Telephone Encounter (Signed)
Medication: HYDROcodone-acetaminophen (NORCO) 10-325 MG tablet   Has the patient contacted their pharmacy? No.   Preferred Pharmacy:   [id number] - [place], [place] - [address]   [address], [place] [address]  Phone:  [phone]  Fax:  [phone] 

## 2022-01-30 NOTE — Telephone Encounter (Signed)
Requesting: NORCO ?Contract: 01/02/22 ?UDS: 01/02/22 ?Last OV: 01/02/2022 ?Next OV: N/A ?Last Refill: 12/27/2021, #60--0 RF ?Database: ? ? ?Please advise  ? ?

## 2022-02-14 ENCOUNTER — Ambulatory Visit: Payer: Medicare HMO

## 2022-02-21 ENCOUNTER — Ambulatory Visit (INDEPENDENT_AMBULATORY_CARE_PROVIDER_SITE_OTHER): Payer: Medicare HMO

## 2022-02-21 ENCOUNTER — Ambulatory Visit: Payer: Medicare HMO

## 2022-02-21 DIAGNOSIS — E538 Deficiency of other specified B group vitamins: Secondary | ICD-10-CM

## 2022-02-21 MED ORDER — CYANOCOBALAMIN 1000 MCG/ML IJ SOLN
1000.0000 ug | Freq: Once | INTRAMUSCULAR | Status: AC
  Administered 2022-02-21: 1000 ug via INTRAMUSCULAR

## 2022-02-21 NOTE — Progress Notes (Signed)
Pt here for monthly B12 injection per Carollee Herter. ? ?B12 1021mg given IM, and pt tolerated injection well. Left Deltoid   ? ?Next B12 injection scheduled for 6//6/23  ?

## 2022-02-27 ENCOUNTER — Other Ambulatory Visit: Payer: Self-pay | Admitting: Family Medicine

## 2022-02-27 MED ORDER — HYDROCODONE-ACETAMINOPHEN 10-325 MG PO TABS: 1.0000 | ORAL_TABLET | Freq: Four times a day (QID) | ORAL | 0 refills | Status: DC | PRN

## 2022-02-27 NOTE — Telephone Encounter (Signed)
Pt called stating she needs a refill on her HYDROcodone since she only has a few pills left.  ? ?Medication:  ? ?HYDROcodone-acetaminophen (NORCO) 10-325 MG tablet [[id number]]  ? ?Has the patient contacted their pharmacy? No. ?(If no, request that the patient contact the pharmacy for the refill.) ?(If yes, when and what did the pharmacy advise?) ? ?Preferred Pharmacy (with phone number or street name):  ? ?[facility], [place], [place] [address]  ?Phone:  [phone]  Fax:  [phone]  ? ?Agent: Please be advised that RX refills may take up to 3 business days. We ask that you follow-up with your pharmacy. ? ?

## 2022-02-27 NOTE — Telephone Encounter (Signed)
Requesting: NORCO ?Contract: 01/02/2022 ?UDS: 01/02/2022 ?Last OV: 01/02/2022 ?Next OV: N/A ?Last Refill: 01/30/2022, #60--0 RF ?Database: ? ? ?Please advise  ? ?

## 2022-03-06 ENCOUNTER — Ambulatory Visit: Payer: Medicare HMO | Admitting: Neurology

## 2022-03-06 ENCOUNTER — Encounter: Payer: Self-pay | Admitting: Neurology

## 2022-03-06 VITALS — BP 141/88 | HR 98 | Ht 66.5 in | Wt 192.5 lb

## 2022-03-06 DIAGNOSIS — G4719 Other hypersomnia: Secondary | ICD-10-CM | POA: Diagnosis not present

## 2022-03-06 DIAGNOSIS — Z9189 Other specified personal risk factors, not elsewhere classified: Secondary | ICD-10-CM

## 2022-03-06 DIAGNOSIS — R799 Abnormal finding of blood chemistry, unspecified: Secondary | ICD-10-CM | POA: Diagnosis not present

## 2022-03-06 DIAGNOSIS — R7301 Impaired fasting glucose: Secondary | ICD-10-CM | POA: Diagnosis not present

## 2022-03-06 DIAGNOSIS — G478 Other sleep disorders: Secondary | ICD-10-CM

## 2022-03-06 DIAGNOSIS — G9332 Myalgic encephalomyelitis/chronic fatigue syndrome: Secondary | ICD-10-CM | POA: Diagnosis not present

## 2022-03-06 DIAGNOSIS — Z79899 Other long term (current) drug therapy: Secondary | ICD-10-CM | POA: Diagnosis not present

## 2022-03-06 DIAGNOSIS — Z0389 Encounter for observation for other suspected diseases and conditions ruled out: Secondary | ICD-10-CM | POA: Diagnosis not present

## 2022-03-06 DIAGNOSIS — R0689 Other abnormalities of breathing: Secondary | ICD-10-CM

## 2022-03-06 DIAGNOSIS — Z0001 Encounter for general adult medical examination with abnormal findings: Secondary | ICD-10-CM | POA: Diagnosis not present

## 2022-03-06 DIAGNOSIS — Z5181 Encounter for therapeutic drug level monitoring: Secondary | ICD-10-CM | POA: Diagnosis not present

## 2022-03-06 NOTE — Patient Instructions (Signed)
Muscle Pain, Adult ?Muscle pain, also called myalgia, is a condition in which a person has pain in one or more muscles in the body. Muscle pain may be mild, moderate, or severe. It may feel sharp, achy, or burning. In most cases, the pain lasts only a short time and goes away without treatment. ?Muscle pain can result from using muscles in a new or different way or after a period of inactivity. It is normal to feel some muscle pain after starting an exercise program. Muscles that have not been used often will be sore at first. ?What are the causes? ?This condition is caused by using muscles in a new or different way after a period of inactivity. Other causes may include: ?Overuse or muscle strain, especially if you are not in shape. This is the most common cause of muscle pain. ?Injury or bruising. ?Infectious diseases, including diseases caused by viruses, such as the flu (influenza). ?Fibromyalgia.This is a long-term, or chronic, condition that causes muscle tenderness, tiredness (fatigue), and headache. ?Autoimmune or rheumatologic diseases. These are conditions, such as lupus, in which the body's defense system (immunesystem) attacks areas in the body. ?Certain medicines, including ACE inhibitors and statins. ?What are the signs or symptoms? ?The main symptom of this condition is sore or painful muscles, including during activity and when stretching. You may also have slight swelling. ?How is this diagnosed? ?This condition is diagnosed with a physical exam. Your health care provider will ask questions about your pain and when it began. If you have not had muscle pain for very long, your health care provider may want to wait before doing much testing. If your muscle pain has lasted a long time, tests may be done right away. In some cases, this may include tests to rule out certain conditions or illnesses. ?How is this treated? ?Treatment for this condition depends on the cause. Home care is often enough to  relieve muscle pain. Your health care provider may also prescribe NSAIDs, such as ibuprofen. ?Follow these instructions at home: ?Medicines ?Take over-the-counter and prescription medicines only as told by your health care provider. ?Ask your health care provider if the medicine prescribed to you requires you to avoid driving or using machinery. ?Managing pain, swelling, and discomfort ? ?  ? ?If directed, put ice on the painful area. To do this: ?Put ice in a plastic bag. ?Place a towel between your skin and the bag. ?Leave the ice on for 20 minutes, 2-3 times a day. ?For the first 2 days of muscle soreness, or if there is swelling: ?Do not soak in hot baths. ?Do not use a hot tub, steam room, sauna, heating pad, or other heat source. ?After 48-72 hours, you may alternate between applying ice and applying heat as told by your health care provider. If directed, apply heat to the affected area as often as told by your health care provider. Use the heat source that your health care provider recommends, such as a moist heat pack or a heating pad. ?Place a towel between your skin and the heat source. ?Leave the heat on for 20-30 minutes. ?Remove the heat if your skin turns bright red. This is especially important if you are unable to feel pain, heat, or cold. You may have a greater risk of getting burned. ?If you have an injury, raise (elevate) the injured area above the level of your heart while you are sitting or lying down. ?Activity ? ?If overuse is causing your muscle pain: ?Slow down  your activities until the pain goes away. ?Do regular, gentle exercises if you are not usually active. ?Warm up before exercising. Stretch before and after exercising. This can help lower the risk of muscle pain. ?Do not continue working out if the pain is severe. Severe pain could mean that you have injured a muscle. ?Do not lift anything that is heavier than 5-10 lb (2.3-4.5 kg), or the limit that you are told, until your health  care provider says that it is safe. ?Return to your normal activities as told by your health care provider. Ask your health care provider what activities are safe for you. ?General instructions ?Do not use any products that contain nicotine or tobacco, such as cigarettes, e-cigarettes, and chewing tobacco. These can delay healing. If you need help quitting, ask your health care provider. ?Keep all follow-up visits as told by your health care provider. This is important. ?Contact a health care provider if you have: ?Muscle pain that gets worse and medicines do not help. ?Muscle pain that lasts longer than 3 days. ?A rash or fever along with muscle pain. ?Muscle pain after a tick bite. ?Muscle pain while working out, even though you are in good physical condition. ?Redness, soreness, or swelling along with muscle pain. ?Muscle pain after starting a new medicine or changing the dose of a medicine. ?Get help right away if you have: ?Trouble breathing. ?Trouble swallowing. ?Muscle pain along with a stiff neck, fever, and vomiting. ?Severe muscle weakness or you cannot move part of your body. ?These symptoms may represent a serious problem that is an emergency. Do not wait to see if the symptoms will go away. Get medical help right away. Call your local emergency services (911 in the [place] Do not drive yourself to the hospital. ?Summary ?Muscle pain usually lasts only a short time and goes away without treatment. ?This condition is caused by using muscles in a new or different way after a period of inactivity. ?If your muscle pain lasts longer than 3 days, tell your health care provider. ?This information is not intended to replace advice given to you by your health care provider. Make sure you discuss any questions you have with your health care provider. ?Document Revised: 04/29/2021 Document Reviewed: 04/29/2021 ?Elsevier Patient Education ? [place]. ?Fatigue ?If you have fatigue, you feel tired all the time  and have a lack of energy or a lack of motivation. Fatigue may make it difficult to start or complete tasks because of exhaustion. ?Occasional or mild fatigue is often a normal response to activity or life. However, long-term (chronic) or extreme fatigue may be a symptom of a medical condition such as: ?Depression. ?Not having enough red blood cells or hemoglobin in the blood (anemia). ?A problem with a small gland located in the lower front part of the neck (thyroid disorder). ?Rheumatologic conditions. These are problems related to the body's defense system (immune system). ?Infections, especially certain viral infections. ?Fatigue can also lead to negative health outcomes over time. ?Follow these instructions at home: ?Medicines ?Take over-the-counter and prescription medicines only as told by your health care provider. ?Take a multivitamin if told by your health care provider. ?Do not use herbal or dietary supplements unless they are approved by your health care provider. ?Eating and drinking ? ?Avoid heavy meals in the evening. ?Eat a well-balanced diet, which includes lean proteins, whole grains, plenty of fruits and vegetables, and low-fat dairy products. ?Avoid eating or drinking too many products with caffeine in  them. ?Avoid alcohol. ?Drink enough fluid to keep your urine pale yellow. ?Activity ? ?Exercise regularly, as told by your health care provider. ?Use or practice techniques to help you relax, such as yoga, tai chi, meditation, or massage therapy. ?Lifestyle ?Change situations that cause you stress. Try to keep your work and personal schedules in balance. ?Do not use recreational or illegal drugs. ?General instructions ?Monitor your fatigue for any changes. ?Go to bed and get up at the same time every day. ?Avoid fatigue by pacing yourself during the day and getting enough sleep at night. ?Maintain a healthy weight. ?Contact a health care provider if: ?Your fatigue does not get better. ?You have a  fever. ?You suddenly lose or gain weight. ?You have headaches. ?You have trouble falling asleep or sleeping through the night. ?You feel angry, guilty, anxious, or sad. ?You have swelling in your legs or anot
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
## 2022-03-08 ENCOUNTER — Encounter: Payer: Self-pay | Admitting: Neurology

## 2022-03-08 LAB — VITAMIN D 25 HYDROXY (VIT D DEFICIENCY, FRACTURES): Vit D, 25-Hydroxy: 18.8 ng/mL — ABNORMAL LOW (ref 30.0–100.0)

## 2022-03-08 LAB — PROTEIN ELECTROPHORESIS, SERUM
A/G Ratio: 1.4 (ref 0.7–1.7)
Albumin ELP: 4.1 g/dL (ref 2.9–4.4)
Alpha 1: 0.2 g/dL (ref 0.0–0.4)
Alpha 2: 0.7 g/dL (ref 0.4–1.0)
Beta: 1.2 g/dL (ref 0.7–1.3)
Gamma Globulin: 0.8 g/dL (ref 0.4–1.8)
Globulin, Total: 2.9 g/dL (ref 2.2–3.9)
Total Protein: 7 g/dL (ref 6.0–8.5)

## 2022-03-08 LAB — CK TOTAL AND CKMB (NOT AT ARMC)
CK-MB Index: 1.5 ng/mL (ref 0.0–5.3)
Total CK: 119 U/L (ref 32–182)

## 2022-03-08 LAB — C-REACTIVE PROTEIN: CRP: 9 mg/L (ref 0–10)

## 2022-03-09 ENCOUNTER — Encounter: Payer: Self-pay | Admitting: *Deleted

## 2022-03-09 NOTE — Telephone Encounter (Signed)
Called pt. LVM about lab results. Asked her to call back if she has any further questions. Advised CB,RN also sent [facility] yesterday if she wanted to reference that as well.

## 2022-03-15 ENCOUNTER — Telehealth: Payer: Self-pay | Admitting: Neurology

## 2022-03-15 NOTE — Telephone Encounter (Signed)
NPSG- [employer] medicare Josem Kaufmann: [id number] (exp. 03/09/22 to 09/05/22). Patient is scheduled at [facility] for 03/20/22 at 9 PM.

## 2022-03-20 ENCOUNTER — Ambulatory Visit (INDEPENDENT_AMBULATORY_CARE_PROVIDER_SITE_OTHER): Payer: Medicare HMO | Admitting: Neurology

## 2022-03-20 DIAGNOSIS — G4710 Hypersomnia, unspecified: Secondary | ICD-10-CM

## 2022-03-20 DIAGNOSIS — G9332 Myalgic encephalomyelitis/chronic fatigue syndrome: Secondary | ICD-10-CM

## 2022-03-20 DIAGNOSIS — G4719 Other hypersomnia: Secondary | ICD-10-CM

## 2022-03-20 DIAGNOSIS — Z9189 Other specified personal risk factors, not elsewhere classified: Secondary | ICD-10-CM

## 2022-03-28 ENCOUNTER — Ambulatory Visit (INDEPENDENT_AMBULATORY_CARE_PROVIDER_SITE_OTHER): Payer: Medicare HMO

## 2022-03-28 ENCOUNTER — Other Ambulatory Visit: Payer: Self-pay

## 2022-03-28 DIAGNOSIS — E538 Deficiency of other specified B group vitamins: Secondary | ICD-10-CM | POA: Diagnosis not present

## 2022-03-28 MED ORDER — CYANOCOBALAMIN 1000 MCG/ML IJ SOLN
1000.0000 ug | Freq: Once | INTRAMUSCULAR | Status: AC
  Administered 2022-03-28: 1000 ug via INTRAMUSCULAR

## 2022-03-28 MED ORDER — HYDROCODONE-ACETAMINOPHEN 10-325 MG PO TABS: 1.0000 | ORAL_TABLET | Freq: Four times a day (QID) | ORAL | 0 refills | Status: DC | PRN

## 2022-03-28 NOTE — Progress Notes (Signed)
Pt here for monthly B12 injection per Carollee Herter.  B12 1058mg given IM, and pt tolerated injection well. Left Deltoid   Next B12 injection scheduled for 6//6/23

## 2022-04-03 ENCOUNTER — Encounter: Payer: Self-pay | Admitting: Neurology
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
## 2022-04-06 ENCOUNTER — Ambulatory Visit (INDEPENDENT_AMBULATORY_CARE_PROVIDER_SITE_OTHER): Payer: Medicare HMO

## 2022-04-06 DIAGNOSIS — Z0000 Encounter for general adult medical examination without abnormal findings: Secondary | ICD-10-CM

## 2022-04-06 NOTE — Patient Instructions (Signed)
Emily Maddox , Thank you for taking time to come for your Medicare Wellness Visit. I appreciate your ongoing commitment to your health goals. Please review the following plan we discussed and let me know if I can assist you in the future.   Screening recommendations/referrals: Colonoscopy: declined Mammogram: 05/14/21 due 05/14/22 Bone Density: N/A Recommended yearly ophthalmology/optometry visit for glaucoma screening and checkup Recommended yearly dental visit for hygiene and checkup  Vaccinations: Influenza vaccine: up to date Pneumococcal vaccine: N/A Tdap vaccine: Due-May obtain vaccine at our office or your local pharmacy.  Shingles vaccine: N/A  Covid-19: Due-May obtain vaccine at your local pharmacy.   Advanced directives: yes, not on file  Conditions/risks identified: see problem list  Next appointment: Follow up in one year for your annual wellness visit.   Preventive Care 40-64 Years, Female Preventive care refers to lifestyle choices and visits with your health care provider that can promote health and wellness. What does preventive care include? A yearly physical exam. This is also called an annual well check. Dental exams once or twice a year. Routine eye exams. Ask your health care provider how often you should have your eyes checked. Personal lifestyle choices, including: Daily care of your teeth and gums. Regular physical activity. Eating a healthy diet. Avoiding tobacco and drug use. Limiting alcohol use. Practicing safe sex. Taking low-dose aspirin daily starting at age 69. Taking vitamin and mineral supplements as recommended by your health care provider. What happens during an annual well check? The services and screenings done by your health care provider during your annual well check will depend on your age, overall health, lifestyle risk factors, and family history of disease. Counseling  Your health care provider may ask you questions about your: Alcohol  use. Tobacco use. Drug use. Emotional well-being. Home and relationship well-being. Sexual activity. Eating habits. Work and work Statistician. Method of birth control. Menstrual cycle. Pregnancy history. Screening  You may have the following tests or measurements: Height, weight, and BMI. Blood pressure. Lipid and cholesterol levels. These may be checked every 5 years, or more frequently if you are over 51 years old. Skin check. Lung cancer screening. You may have this screening every year starting at age 80 if you have a 30-pack-year history of smoking and currently smoke or have quit within the past 15 years. Fecal occult blood test (FOBT) of the stool. You may have this test every year starting at age 50. Flexible sigmoidoscopy or colonoscopy. You may have a sigmoidoscopy every 5 years or a colonoscopy every 10 years starting at age 12. Hepatitis C blood test. Hepatitis B blood test. Sexually transmitted disease (STD) testing. Diabetes screening. This is done by checking your blood sugar (glucose) after you have not eaten for a while (fasting). You may have this done every 1-3 years. Mammogram. This may be done every 1-2 years. Talk to your health care provider about when you should start having regular mammograms. This may depend on whether you have a family history of breast cancer. BRCA-related cancer screening. This may be done if you have a family history of breast, ovarian, tubal, or peritoneal cancers. Pelvic exam and Pap test. This may be done every 3 years starting at age 32. Starting at age 73, this may be done every 5 years if you have a Pap test in combination with an HPV test. Bone density scan. This is done to screen for osteoporosis. You may have this scan if you are at high risk for osteoporosis. Discuss  your test results, treatment options, and if necessary, the need for more tests with your health care provider. Vaccines  Your health care provider may recommend  certain vaccines, such as: Influenza vaccine. This is recommended every year. Tetanus, diphtheria, and acellular pertussis (Tdap, Td) vaccine. You may need a Td booster every 10 years. Zoster vaccine. You may need this after age 61. Pneumococcal 13-valent conjugate (PCV13) vaccine. You may need this if you have certain conditions and were not previously vaccinated. Pneumococcal polysaccharide (PPSV23) vaccine. You may need one or two doses if you smoke cigarettes or if you have certain conditions. Talk to your health care provider about which screenings and vaccines you need and how often you need them. This information is not intended to replace advice given to you by your health care provider. Make sure you discuss any questions you have with your health care provider. Document Released: 11/05/2015 Document Revised: 06/28/2016 Document Reviewed: 08/10/2015 Elsevier Interactive Patient Education  2017 [place] Prevention in the Home Falls can cause injuries. They can happen to people of all ages. There are many things you can do to make your home safe and to help prevent falls. What can I do on the outside of my home? Regularly fix the edges of walkways and driveways and fix any cracks. Remove anything that might make you trip as you walk through a door, such as a raised step or threshold. Trim any bushes or trees on the path to your home. Use bright outdoor lighting. Clear any walking paths of anything that might make someone trip, such as rocks or tools. Regularly check to see if handrails are loose or broken. Make sure that both sides of any steps have handrails. Any raised decks and porches should have guardrails on the edges. Have any leaves, snow, or ice cleared regularly. Use sand or salt on walking paths during winter. Clean up any spills in your garage right away. This includes oil or grease spills. What can I do in the bathroom? Use night lights. Install grab bars  by the toilet and in the tub and shower. Do not use towel bars as grab bars. Use non-skid mats or decals in the tub or shower. If you need to sit down in the shower, use a plastic, non-slip stool. Keep the floor dry. Clean up any water that spills on the floor as soon as it happens. Remove soap buildup in the tub or shower regularly. Attach bath mats securely with double-sided non-slip rug tape. Do not have throw rugs and other things on the floor that can make you trip. What can I do in the bedroom? Use night lights. Make sure that you have a light by your bed that is easy to reach. Do not use any sheets or blankets that are too big for your bed. They should not hang down onto the floor. Have a firm chair that has side arms. You can use this for support while you get dressed. Do not have throw rugs and other things on the floor that can make you trip. What can I do in the kitchen? Clean up any spills right away. Avoid walking on wet floors. Keep items that you use a lot in easy-to-reach places. If you need to reach something above you, use a strong step stool that has a grab bar. Keep electrical cords out of the way. Do not use floor polish or wax that makes floors slippery. If you must use wax, use  non-skid floor wax. Do not have throw rugs and other things on the floor that can make you trip. What can I do with my stairs? Do not leave any items on the stairs. Make sure that there are handrails on both sides of the stairs and use them. Fix handrails that are broken or loose. Make sure that handrails are as long as the stairways. Check any carpeting to make sure that it is firmly attached to the stairs. Fix any carpet that is loose or worn. Avoid having throw rugs at the top or bottom of the stairs. If you do have throw rugs, attach them to the floor with carpet tape. Make sure that you have a light switch at the top of the stairs and the bottom of the stairs. If you do not have them, ask  someone to add them for you. What else can I do to help prevent falls? Wear shoes that: Do not have high heels. Have rubber bottoms. Are comfortable and fit you well. Are closed at the toe. Do not wear sandals. If you use a stepladder: Make sure that it is fully opened. Do not climb a closed stepladder. Make sure that both sides of the stepladder are locked into place. Ask someone to hold it for you, if possible. Clearly mark and make sure that you can see: Any grab bars or handrails. First and last steps. Where the edge of each step is. Use tools that help you move around (mobility aids) if they are needed. These include: Canes. Walkers. Scooters. Crutches. Turn on the lights when you go into a dark area. Replace any light bulbs as soon as they burn out. Set up your furniture so you have a clear path. Avoid moving your furniture around. If any of your floors are uneven, fix them. If there are any pets around you, be aware of where they are. Review your medicines with your doctor. Some medicines can make you feel dizzy. This can increase your chance of falling. Ask your doctor what other things that you can do to help prevent falls. This information is not intended to replace advice given to you by your health care provider. Make sure you discuss any questions you have with your health care provider. Document Released: 08/05/2009 Document Revised: 03/16/2016 Document Reviewed: 11/13/2014 Elsevier Interactive Patient Education  2017 [employer].

## 2022-04-06 NOTE — Progress Notes (Signed)
Subjective:   Emily Maddox is a 51 y.o. female who presents for Medicare Annual (Subsequent) preventive examination.  I connected with  [name] on 04/06/22 by a audio enabled telemedicine application and verified that I am speaking with the correct person using two identifiers.  Patient Location: Home  Provider Location: Office/Clinic  I discussed the limitations of evaluation and management by telemedicine. The patient expressed understanding and agreed to proceed.   Review of Systems     Cardiac Risk Factors include: advanced age (>25men, >93 women);obesity (BMI >30kg/m2);hypertension;diabetes mellitus;dyslipidemia     Objective:    Today's Vitals   04/06/22 1546  PainSc: 5    There is no height or weight on file to calculate BMI.     04/06/2022    3:44 PM 03/31/2021    3:03 PM 03/29/2020    1:57 PM 01/28/2019    3:17 PM 03/06/2017   10:31 AM 11/11/2015    1:24 PM 10/11/2015    3:23 PM  Advanced Directives  Does Patient Have a Medical Advance Directive? Yes Yes Yes Yes No No No  Type of Paramedic of [place];Out of facility DNR (pink MOST or yellow form);Living will [place];Living will [place];Living will [place];Living will     Does patient want to make changes to medical advance directive?   No - Patient declined No - Patient declined     Copy of [place] in Chart? No - copy requested No - copy requested No - copy requested Yes - validated most recent copy scanned in chart (See row information)     Would patient like information on creating a medical advance directive?     Yes (MAU/Ambulatory/Procedural Areas - Information given) No - patient declined information No - patient declined information    Current Medications (verified) Outpatient Encounter Medications as of 04/06/2022  Medication Sig   Blood Glucose Monitoring Suppl (ONETOUCH VERIO FLEX SYSTEM)  w/Device KIT 1 each by Does not apply route daily. Dx code: E11.9   glucose blood (ONETOUCH VERIO) test strip USE 1 STRIP TO CHECK GLUCOSE ONCE DAILY   HYDROcodone-acetaminophen (NORCO) 10-325 MG tablet Take 1 tablet by mouth every 6 (six) hours as needed.   Multiple Vitamin (MULTIVITAMIN) tablet Take 1 tablet by mouth daily.   ONETOUCH DELICA LANCETS 36U MISC Check sugar every day.  Dx Code: E11.9   Probiotic Product (PROBIOTIC DAILY PO) Take 1 capsule by mouth daily.   rosuvastatin (CRESTOR) 40 MG tablet Take 1 tablet by mouth once daily   VITAMIN D PO Take 2,000 Units by mouth daily.   No facility-administered encounter medications on file as of 04/06/2022.    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   Diabetes mellitus    Hyperlipidemia    Tachycardia    Past Surgical History:  Procedure Laterality Date   BREAST EXCISIONAL BIOPSY Left 2003   Family History  Problem Relation Age of Onset   Cancer Mother 30       breast   Hypertension Mother    Hyperlipidemia Mother    Diabetes Mother    Breast cancer Mother 60   Cancer Father 44       colon   Hypertension Father    Hyperlipidemia Father  Heart disease Father        a fib   Diabetes Father    Diabetes Sister    Breast cancer Other    Colon cancer Other    Social History   Socioeconomic History   Marital status: Single    Spouse name: Not on file   Number of children: Not on file   Years of education: Not on file   Highest education level: Bachelor's degree (e.g., BA, AB, BS)  Occupational History    Employer: UNEMPLOYED  Tobacco Use   Smoking status: Every Day    Packs/day: 1.00    Years: 19.00    Total pack years: 19.00    Types: Cigarettes   Smokeless tobacco: Never   Tobacco comments:    couldn't  tolerate chantix, cut down to 0.5 pack since 11/2013  Vaping Use   Vaping Use: Never used  Substance and Sexual Activity   Alcohol use: No   Drug use: No   Sexual activity: Yes    Partners: Female  Other Topics Concern   Not on file  Social History Narrative   Lives alone.     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 in business administration   Exercise--  3-4 x a week   R handed   Caffeine: 3 c of coffee a day   Social Determinants of Health   Financial Resource Strain: Low Risk  (03/31/2021)   Overall Financial Resource Strain (CARDIA)    Difficulty of Paying Living Expenses: Not hard at all  Food Insecurity: No Food Insecurity (03/31/2021)   Hunger Vital Sign    Worried About Running Out of Food in the Last Year: Never true    Ran Out of Food in the Last Year: Never true  Transportation Needs: No Transportation Needs (03/31/2021)   PRAPARE - Hydrologist (Medical): No    Lack of Transportation (Non-Medical): No  Physical Activity: Sufficiently Active (03/31/2021)   Exercise Vital Sign    Days of Exercise per Week: 3 days    Minutes of Exercise per Session: 50 min  Stress: Stress Concern Present (03/31/2021)   [place]    Feeling of Stress : To some extent  Social Connections: Moderately Isolated (03/31/2021)   Social Connection and Isolation Panel [NHANES]    Frequency of Communication with Friends and Family: More than three times a week    Frequency of Social Gatherings with Friends and Family: More than three times a week    Attends Religious Services: More than 4 times per year    Active Member of [employer] or Organizations: Not on file    Attends Archivist Meetings: Never    Marital Status: Never married    Tobacco Counseling Ready to quit: Not Answered Counseling given: Not Answered Tobacco comments: couldn't tolerate chantix, cut down to 0.5 pack since 11/2013   Clinical  Intake:  Pre-visit preparation completed: Yes  Pain : 0-10 Pain Score: 5  Pain Location: Other (Comment) (thighs, legs,and lower back) Pain Descriptors / Indicators: Aching, Dull, Sore Pain Onset: More than a month ago Pain Frequency: Constant     BMI - recorded: 31.55 Nutritional Status: BMI > 30  Obese Diabetes: Yes CBG done?: No Did pt. bring in CBG monitor from home?: No  How often do you need to have someone help you when you read instructions, pamphlets, or other written materials from your doctor or pharmacy?: 1 -  Never  Diabetic?yes Nutrition Risk Assessment:  Has the patient had any N/V/D within the last 2 months?  No  Does the patient have any non-healing wounds?  No  Has the patient had any unintentional weight loss or weight gain?  No   Diabetes:  Is the patient diabetic?  Yes  If diabetic, was a CBG obtained today?  No  Did the patient bring in their glucometer from home?  No  How often do you monitor your CBG's? Once daily.   Financial Strains and Diabetes Management:  Are you having any financial strains with the device, your supplies or your medication? No .  Does the patient want to be seen by Chronic Care Management for management of their diabetes?  No  Would the patient like to be referred to a Nutritionist or for Diabetic Management?  No   Diabetic Exams:  Diabetic Eye Exam: Overdue for diabetic eye exam. Pt has been advised about the importance in completing this exam. Patient advised to call and schedule an eye exam. Diabetic Foot Exam: Overdue, Pt has been advised about the importance in completing this exam. Pt is scheduled for diabetic foot exam on n/a.   Interpreter Needed?: No  Information entered by :: [place] of Daily Living    04/06/2022    3:50 PM  In your present state of health, do you have any difficulty performing the following activities:  Hearing? 0  Vision? 0  Difficulty concentrating or making  decisions? 0  Walking or climbing stairs? 1  Dressing or bathing? 0  Doing errands, shopping? 1  Comment when she has pain  Preparing Food and eating ? N  Using the Toilet? N  In the past six months, have you accidently leaked urine? Y  Do you have problems with loss of bowel control? N  Managing your Medications? N  Managing your Finances? N  Housekeeping or managing your Housekeeping? N    Patient Care Team: Carollee Herter, Alferd Apa, DO as PCP - General Juanita Craver, MD as Consulting Physician (Gastroenterology) Linda Hedges, DO as Consulting Physician (Obstetrics and Gynecology)  Indicate any recent Medical Services you may have received from other than [facility] providers in the past year (date may be approximate).     Assessment:   This is a routine wellness examination for Carolena.  Hearing/Vision screen No results found.  Dietary issues and exercise activities discussed: Current Exercise Habits: The patient does not participate in regular exercise at present, Exercise limited by: None identified   Goals Addressed             This Visit's Progress    Increase physical activity   Not on track    Quit smoking / using tobacco   Not on track      Depression Screen    04/06/2022    3:45 PM 03/31/2021    3:05 PM 03/29/2020    2:13 PM 01/28/2019    3:17 PM 11/14/2018    3:23 PM 03/06/2017   10:30 AM 08/29/2016    9:24 AM  PHQ 2/9 Scores  PHQ - 2 Score 0 0 0 0 0 0 0  PHQ- 9 Score     4      Fall Risk    04/06/2022    3:45 PM 03/31/2021    3:04 PM 03/29/2020    2:02 PM 01/28/2019    3:17 PM 03/06/2017   10:30 AM  Fall Risk   Falls in the  past year? 0 0 0 0 Yes  Number falls in past yr: 0 0 0  2 or more  Injury with Fall? 0 0 0  No  Risk for fall due to : No Fall Risks    Impaired balance/gait  Follow up Falls evaluation completed Falls prevention discussed Education provided;Falls prevention discussed      FALL RISK PREVENTION PERTAINING TO THE HOME:  Any stairs in or  around the home? Yes  If so, are there any without handrails? No  Home free of loose throw rugs in walkways, pet beds, electrical cords, etc? Yes  Adequate lighting in your home to reduce risk of falls? Yes   ASSISTIVE DEVICES UTILIZED TO PREVENT FALLS:  Life alert? No  Use of a cane, walker or w/c? No  Grab bars in the bathroom? No  Shower chair or bench in shower? Yes  Elevated toilet seat or a handicapped toilet? Yes   TIMED UP AND GO:  Was the test performed? No .    Cognitive Function:        04/06/2022    3:52 PM  6CIT Screen  What Year? 0 points  What month? 0 points  What time? 0 points  Count back from 20 0 points  Months in reverse 0 points  Repeat phrase 0 points  Total Score 0 points    Immunizations Immunization History  Administered Date(s) Administered   Hep A / Hep B 02/03/2009, 04/05/2009, 09/03/2009, 10/31/2010   Influenza,inj,Quad PF,6+ Mos 09/01/2021   PFIZER(Purple Top)SARS-COV-2 Vaccination 05/04/2020, 05/26/2020   PPD Test 08/08/2017   Td 02/03/2009    TDAP status: Due, Education has been provided regarding the importance of this vaccine. Advised may receive this vaccine at local pharmacy or Health Dept. Aware to provide a copy of the vaccination record if obtained from local pharmacy or Health Dept. Verbalized acceptance and understanding.  Flu Vaccine status: Up to date    Covid-19 vaccine status: Information provided on how to obtain vaccines.    Screening Tests Health Maintenance  Topic Date Due   Hepatitis C Screening  Never done   PAP SMEAR-Modifier  07/27/2017   OPHTHALMOLOGY EXAM  11/23/2017   FOOT EXAM  10/29/2018   TETANUS/TDAP  02/04/2019   COVID-19 Vaccine (3 - Pfizer risk series) 06/23/2020   COLONOSCOPY (Pts 45-58yrs Insurance coverage will need to be confirmed)  01/02/2021   MAMMOGRAM  05/14/2022   INFLUENZA VACCINE  05/23/2022   HEMOGLOBIN A1C  07/05/2022   URINE MICROALBUMIN  01/03/2023   HIV Screening   Completed   HPV VACCINES  Aged Out    Health Maintenance  Health Maintenance Due  Topic Date Due   Hepatitis C Screening  Never done   PAP SMEAR-Modifier  07/27/2017   OPHTHALMOLOGY EXAM  11/23/2017   FOOT EXAM  10/29/2018   TETANUS/TDAP  02/04/2019   COVID-19 Vaccine (3 - Pfizer risk series) 06/23/2020   COLONOSCOPY (Pts 45-53yrs Insurance coverage will need to be confirmed)  01/02/2021    Colorectal cancer screening: Referral to GI placed declined. Pt aware the office will call re: appt.  Mammogram status: Completed 05/14/21. Repeat every year    Lung Cancer Screening: (Low Dose CT Chest recommended if Age 41-80 years, 30 pack-year currently smoking OR have quit w/in 15years.) does not qualify.   Lung Cancer Screening Referral: N/A  Additional Screening:  Hepatitis C Screening: does qualify; Completed not yet  Vision Screening: Recommended annual ophthalmology exams for early detection of glaucoma and  other disorders of the eye. Is the patient up to date with their annual eye exam?  Yes  Who is the provider or what is the name of the office in which the patient attends annual eye exams? [facility] If pt is not established with a provider, would they like to be referred to a provider to establish care? No .   Dental Screening: Recommended annual dental exams for proper oral hygiene  Community Resource Referral / Chronic Care Management: CRR required this visit?  No   CCM required this visit?  No      Plan:     I have personally reviewed and noted the following in the patient's chart:   Medical and social history Use of alcohol, tobacco or illicit drugs  Current medications and supplements including opioid prescriptions.  Functional ability and status Nutritional status Physical activity Advanced directives List of other physicians Hospitalizations, surgeries, and ER visits in previous 12 months Vitals Screenings to include cognitive, depression, and  falls Referrals and appointments  In addition, I have reviewed and discussed with patient certain preventive protocols, quality metrics, and best practice recommendations. A written personalized care plan for preventive services as well as general preventive health recommendations were provided to patient.   Due to this being a telephonic visit, the after visit summary with patients personalized plan was offered to patient via mail or my-chart.  Patient would like to access on my-chart.   Duard Brady Mckynleigh Mussell, [place]   04/06/2022   Nurse Notes: none

## 2022-04-14 ENCOUNTER — Ambulatory Visit (INDEPENDENT_AMBULATORY_CARE_PROVIDER_SITE_OTHER): Payer: Medicare HMO | Admitting: Family Medicine

## 2022-04-14 ENCOUNTER — Encounter: Payer: Self-pay | Admitting: Family Medicine

## 2022-04-14 VITALS — BP 116/69 | HR 103 | Ht 66.5 in | Wt 192.4 lb

## 2022-04-14 DIAGNOSIS — R5383 Other fatigue: Secondary | ICD-10-CM

## 2022-04-14 LAB — COMPREHENSIVE METABOLIC PANEL
ALT: 10 U/L (ref 0–35)
AST: 15 U/L (ref 0–37)
Albumin: 4.2 g/dL (ref 3.5–5.2)
Alkaline Phosphatase: 65 U/L (ref 39–117)
BUN: 9 mg/dL (ref 6–23)
CO2: 29 mEq/L (ref 19–32)
Calcium: 9.5 mg/dL (ref 8.4–10.5)
Chloride: 103 mEq/L (ref 96–112)
Creatinine, Ser: 0.97 mg/dL (ref 0.40–1.20)
GFR: 68.55 mL/min (ref >60.00)
Glucose, Bld: 64 mg/dL — ABNORMAL LOW (ref 70–99)
Potassium: 4.2 mEq/L (ref 3.5–5.1)
Sodium: 140 mEq/L (ref 135–145)
Total Bilirubin: 0.4 mg/dL (ref 0.2–1.2)
Total Protein: 7 g/dL (ref 6.0–8.3)

## 2022-04-14 LAB — CBC
HCT: 41.3 % (ref 36.0–46.0)
Hemoglobin: 13.7 g/dL (ref 12.0–15.0)
MCHC: 33.2 g/dL (ref 30.0–36.0)
MCV: 99.1 fl (ref 78.0–100.0)
Platelets: 270 10*3/uL (ref 150.0–400.0)
RBC: 4.17 Mil/uL (ref 3.87–5.11)
RDW: 13.9 % (ref 11.5–15.5)
WBC: 9.9 10*3/uL (ref 4.0–10.5)

## 2022-04-14 LAB — VITAMIN B12: Vitamin B-12: 551 pg/mL (ref 211–911)

## 2022-04-14 LAB — TSH: TSH: 1.11 u[IU]/mL (ref 0.35–5.50)

## 2022-04-26 ENCOUNTER — Telehealth: Payer: Self-pay

## 2022-04-26 ENCOUNTER — Other Ambulatory Visit: Payer: Self-pay | Admitting: Family Medicine

## 2022-04-26 ENCOUNTER — Ambulatory Visit: Payer: Medicare HMO

## 2022-04-26 MED ORDER — HYDROCODONE-ACETAMINOPHEN 10-325 MG PO TABS: 1.0000 | ORAL_TABLET | Freq: Four times a day (QID) | ORAL | 0 refills | Status: DC | PRN

## 2022-04-26 NOTE — Telephone Encounter (Signed)
[facility] - Client Client Site [facility] - Night Provider Roma Schanz- MD Contact Type Call Who Is Calling Patient / Member / Family / Caregiver Caller Name [place] Phone Number [phone] Patient Name Emily Maddox Patient DOB 09/08/1971 Call Type Message Only Information Provided Reason for Call Request to Reschedule Office Appointment Initial Comment Caller states, need to reschedule appt today at 9am. Will call back to reschedule. Patient request to speak to RN No Disp. Time Disposition Final User 04/26/2022 7:46:50 AM General Information Provided Yes Jobie Quaker Call Closed By: Jobie Quaker Transaction Date/Time: 04/26/2022 7:44:56 AM (ET)

## 2022-04-26 NOTE — Telephone Encounter (Signed)
Noted.  Appt cancelled.

## 2022-04-26 NOTE — Telephone Encounter (Signed)
Medication: HYDROcodone-acetaminophen (NORCO) 10-325 MG tablet   Has the patient contacted their pharmacy? No.   Preferred Pharmacy:   [facility] [address], [place] - [place] [address] [place], [place] [address]  Phone:  [phone]  Fax:  [phone]

## 2022-04-26 NOTE — Telephone Encounter (Signed)
Requesting: NORCO Contract: 01/02/2022 UDS: 01/02/2022 Last OV: 04/14/2022 Next OV: 05/15/2022 Last Refill: 03/28/2022, #[id number] RF Database:   Please advise

## 2022-05-15 ENCOUNTER — Ambulatory Visit: Payer: Medicare HMO | Admitting: Family Medicine

## 2022-05-19 ENCOUNTER — Encounter: Payer: Self-pay | Admitting: Family Medicine

## 2022-05-19 ENCOUNTER — Ambulatory Visit (INDEPENDENT_AMBULATORY_CARE_PROVIDER_SITE_OTHER): Payer: Medicare HMO | Admitting: Family Medicine

## 2022-05-19 VITALS — BP 112/88 | HR 108 | Temp 97.8°F | Resp 18 | Ht 66.5 in | Wt 195.8 lb

## 2022-05-19 DIAGNOSIS — E1149 Type 2 diabetes mellitus with other diabetic neurological complication: Secondary | ICD-10-CM

## 2022-05-19 DIAGNOSIS — G894 Chronic pain syndrome: Secondary | ICD-10-CM

## 2022-05-19 DIAGNOSIS — M797 Fibromyalgia: Secondary | ICD-10-CM

## 2022-05-19 DIAGNOSIS — M5136 Other intervertebral disc degeneration, lumbar region: Secondary | ICD-10-CM | POA: Diagnosis not present

## 2022-05-19 DIAGNOSIS — I10 Essential (primary) hypertension: Secondary | ICD-10-CM | POA: Diagnosis not present

## 2022-05-19 DIAGNOSIS — E785 Hyperlipidemia, unspecified: Secondary | ICD-10-CM

## 2022-05-19 DIAGNOSIS — E538 Deficiency of other specified B group vitamins: Secondary | ICD-10-CM | POA: Diagnosis not present

## 2022-05-19 LAB — CBC WITH DIFFERENTIAL/PLATELET
Basophils Absolute: 0.1 10*3/uL (ref 0.0–0.1)
Basophils Relative: 0.8 % (ref 0.0–3.0)
Eosinophils Absolute: 0.2 10*3/uL (ref 0.0–0.7)
Eosinophils Relative: 2.2 % (ref 0.0–5.0)
HCT: 42 % (ref 36.0–46.0)
Hemoglobin: 13.9 g/dL (ref 12.0–15.0)
Lymphocytes Relative: 26.4 % (ref 12.0–46.0)
Lymphs Abs: 2.2 10*3/uL (ref 0.7–4.0)
MCHC: 33.1 g/dL (ref 30.0–36.0)
MCV: 98.7 fl (ref 78.0–100.0)
Monocytes Absolute: 0.5 10*3/uL (ref 0.1–1.0)
Monocytes Relative: 5.9 % (ref 3.0–12.0)
Neutro Abs: 5.3 10*3/uL (ref 1.4–7.7)
Neutrophils Relative %: 64.7 % (ref 43.0–77.0)
Platelets: 297 10*3/uL (ref 150.0–400.0)
RBC: 4.26 Mil/uL (ref 3.87–5.11)
RDW: 13.8 % (ref 11.5–15.5)
WBC: 8.2 10*3/uL (ref 4.0–10.5)

## 2022-05-19 LAB — COMPREHENSIVE METABOLIC PANEL
ALT: 8 U/L (ref 0–35)
AST: 13 U/L (ref 0–37)
Albumin: 4.2 g/dL (ref 3.5–5.2)
Alkaline Phosphatase: 63 U/L (ref 39–117)
BUN: 11 mg/dL (ref 6–23)
CO2: 28 mEq/L (ref 19–32)
Calcium: 9.4 mg/dL (ref 8.4–10.5)
Chloride: 104 mEq/L (ref 96–112)
Creatinine, Ser: 0.89 mg/dL (ref 0.40–1.20)
GFR: 75.96 mL/min (ref >60.00)
Glucose, Bld: 78 mg/dL (ref 70–99)
Potassium: 4.7 mEq/L (ref 3.5–5.1)
Sodium: 139 mEq/L (ref 135–145)
Total Bilirubin: 0.3 mg/dL (ref 0.2–1.2)
Total Protein: 7 g/dL (ref 6.0–8.3)

## 2022-05-19 LAB — VITAMIN D 25 HYDROXY (VIT D DEFICIENCY, FRACTURES): VITD: 24.7 ng/mL — ABNORMAL LOW (ref 30.00–100.00)

## 2022-05-19 LAB — LIPID PANEL
Cholesterol: 169 mg/dL (ref 0–200)
HDL: 61.8 mg/dL (ref >39.00)
LDL Cholesterol: 93 mg/dL (ref 0–99)
NonHDL: 106.88
Total CHOL/HDL Ratio: 3
Triglycerides: 69 mg/dL (ref 0.0–149.0)
VLDL: 13.8 mg/dL (ref 0.0–40.0)

## 2022-05-19 LAB — VITAMIN B12: Vitamin B-12: 424 pg/mL (ref 211–911)

## 2022-05-19 LAB — HEMOGLOBIN A1C: Hgb A1c MFr Bld: 6.2 % (ref 4.6–6.5)

## 2022-05-19 LAB — TSH: TSH: 1.18 u[IU]/mL (ref 0.35–5.50)

## 2022-05-19 NOTE — Assessment & Plan Note (Signed)
From bulging disc con't pain med

## 2022-05-19 NOTE — Patient Instructions (Signed)
Myofascial Pain Syndrome and Fibromyalgia ?Myofascial pain syndrome and fibromyalgia are both pain disorders. You may feel this pain mainly in your muscles. ?Myofascial pain syndrome: ?Always has tender points in the muscles that will cause pain when pressed (trigger points). The pain may come and go. ?Usually affects your neck, upper back, and shoulder areas. The pain often moves into your arms and hands. ?Fibromyalgia: ?Has muscle pains and tenderness that come and go. ?Is often associated with tiredness (fatigue) and sleep problems. ?Has trigger points. ?Tends to be long-lasting (chronic), but is not life-threatening. ?Fibromyalgia and myofascial pain syndrome are not the same. However, they often occur together. If you have both conditions, each can make the other worse. Both are common and can cause enough pain and fatigue to make day-to-day activities difficult. Both can be hard to diagnose because their symptoms are common in many other conditions. ?What are the causes? ?The exact causes of these conditions are not known. ?What increases the risk? ?You are more likely to develop either of these conditions if: ?You have a family history of the condition. ?You are female. ?You have certain triggers, such as: ?Spine disorders. ?An injury (trauma) or other physical stressors. ?Being under a lot of stress. ?Medical conditions such as osteoarthritis, rheumatoid arthritis, or lupus. ?What are the signs or symptoms? ?Fibromyalgia ?The main symptom of fibromyalgia is widespread pain and tenderness in your muscles. Pain is sometimes described as stabbing, shooting, or burning. ?You may also have: ?Tingling or numbness. ?Sleep problems and fatigue. ?Problems with attention and concentration (fibro fog). ?Other symptoms may include: ?Bowel and bladder problems. ?Headaches. ?Vision problems. ?Sensitivity to odors and noises. ?Depression or mood changes. ?Painful menstrual periods (dysmenorrhea). ?Dry skin or eyes. ?These  symptoms can vary over time. ?Myofascial pain syndrome ?Symptoms of myofascial pain syndrome include: ?Tight, ropy bands of muscle. ?Uncomfortable sensations in muscle areas. These may include aching, cramping, burning, numbness, tingling, and weakness. ?Difficulty moving certain parts of the body freely (poor range of motion). ?How is this diagnosed? ?This condition may be diagnosed by your symptoms and medical history. You will also have a physical exam. In general: ?Fibromyalgia is diagnosed if you have pain, fatigue, and other symptoms for more than 3 months, and symptoms cannot be explained by another condition. ?Myofascial pain syndrome is diagnosed if you have trigger points in your muscles, and those trigger points are tender and cause pain elsewhere in your body (referred pain). ?How is this treated? ?Treatment for these conditions depends on the type that you have. ?For fibromyalgia a healthy lifestyle is the most important treatment including aerobic and strength exercises. Different types of medicines are used to help treat pain and include: ?NSAIDs. ?Medicines for treating depression. ?Medicines that help control seizures. ?Medicines that relax the muscles. ?Treatment for myofascial pain syndrome includes: ?Pain medicines, such as NSAIDs. ?Cooling and stretching of muscles. ?Massage therapy with myofascial release technique. ?Trigger point injections. ?Treating these conditions often requires a team of health care providers. These may include: ?Your primary care provider. ?A physical therapist. ?Complementary health care providers, such as massage therapists or acupuncturists. ?A psychiatrist for cognitive behavioral therapy. ?Follow these instructions at home: ?Medicines ?Take over-the-counter and prescription medicines only as told by your health care provider. ?Ask your health care provider if the medicine prescribed to you: ?Requires you to avoid driving or using machinery. ?Can cause constipation.  You may need to take these actions to prevent or treat constipation: ?Drink enough fluid to keep your urine pale   yellow. ?Take over-the-counter or prescription medicines. ?Eat foods that are high in fiber, such as beans, whole grains, and fresh fruits and vegetables. ?Limit foods that are high in fat and processed sugars, such as fried or sweet foods. ?Lifestyle ? ?Do exercises as told by your health care provider or physical therapist. ?Practice relaxation techniques to control your stress. You may want to try: ?Biofeedback. ?Visual imagery. ?Hypnosis. ?Muscle relaxation. ?Yoga. ?Meditation. ?Maintain a healthy lifestyle. This includes eating a healthy diet and getting enough sleep. ?Do not use any products that contain nicotine or tobacco. These products include cigarettes, chewing tobacco, and vaping devices, such as e-cigarettes. If you need help quitting, ask your health care provider. ?General instructions ?Talk to your health care provider about complementary treatments, such as acupuncture or massage. ?Do not do activities that stress or strain your muscles. This includes repetitive motions and heavy lifting. ?Keep all follow-up visits. This is important. ?Where to find support ?Consider joining a support group with others who are diagnosed with this condition. ?National Fibromyalgia Association: www.fmaware.org ?Where to find more information ?American Chronic Pain Association: www.theacpa.org ?Contact a health care provider if: ?You have new symptoms. ?Your symptoms get worse or your pain is severe. ?You have side effects from your medicines. ?You have trouble sleeping. ?Your condition is causing depression or anxiety. ?Get help right away if: ?You have thoughts of hurting yourself or others. ?Get help right awayif you feel like you may hurt yourself or others, or have thoughts about taking your own life. Go to your nearest emergency room or: ?Call 911. ?Call the National Suicide Prevention Lifeline at  1-800-273-8255 or 988. This is open 24 hours a day. ?Text the Crisis Text Line at 741741. ?Summary ?Myofascial pain syndrome and fibromyalgia are pain disorders. ?Myofascial pain syndrome has tender points in the muscles that will cause pain when pressed (trigger points). Fibromyalgia also has muscle pains and tenderness that come and go, but this condition is often associated with fatigue and sleep disturbances. ?Fibromyalgia and myofascial pain syndrome are not the same but often occur together, causing pain and fatigue that make day-to-day activities difficult. ?Follow your health care provider's instructions for taking medicines and maintaining a healthy lifestyle. ?This information is not intended to replace advice given to you by your health care provider. Make sure you discuss any questions you have with your health care provider. ?Document Revised: 09/09/2021 Document Reviewed: 09/09/2021 ?Elsevier Patient Education ? 2023 [employer] ? ?

## 2022-05-19 NOTE — Assessment & Plan Note (Signed)
Well controlled, no changes to meds. Encouraged heart healthy diet such as the DASH diet and exercise as tolerated.  °

## 2022-05-19 NOTE — Progress Notes (Signed)
 Subjective:   By signing my name below, I, Tinashe Williams, attest that this documentation has been prepared under the direction and in the presence of Lowne Chase, Yvonne R, DO 05/19/2022    Patient ID: Emily Maddox, female    DOB: 06/17/1971, 50 y.o.   MRN: [id number]  Chief Complaint  Patient presents with   Fatigue    Pt states fatigue still comes and goes and states she would like to discuss future work accommodations    Follow-up    HPI Patient is in today for follow up visit.  She has not been feeling well lately. She has been experiencing episodes of fatigue and exhaustion. She has done a sleep study prior to the visit, but it showed no signs of sleep apnea.  Reports that she has worsening uncontrollable frequent urination. Her last menstrual period was 5 months ago.  She has recently changed jobs. Her current job is less stressful but it now takes more time to commute to work. She now has to wake up earlier in the morning to get to work on time.   She used to exercise 4-5 times a week, but now she can only walk for about 15 minutes each day. She has been gaining weight, and says she is currently close to 200 lbs.    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   Diabetes mellitus    Hyperlipidemia    Tachycardia     Past Surgical History:  Procedure Laterality Date   BREAST EXCISIONAL BIOPSY Left 2003    Family History  Problem Relation Age of Onset   Cancer Mother 46       breast   Hypertension Mother    Hyperlipidemia Mother    Diabetes Mother    Breast cancer Mother 46   Cancer Father 40       colon   Hypertension Father    Hyperlipidemia Father    Heart disease Father        a fib   Diabetes Father    Diabetes Sister    Breast cancer Other    Colon cancer Other     Social History   Socioeconomic History   Marital status: Single    Spouse name: Not on file    Number of children: Not on file   Years of education: Not on file   Highest education level: Bachelor's degree (e.g., BA, AB, BS)  Occupational History    Employer: UNEMPLOYED  Tobacco Use   Smoking status: Every Day    Packs/day: 1.00    Years: 19.00    Total pack years: 19.00    Types: Cigarettes   Smokeless tobacco: Never   Tobacco comments:    couldn't tolerate chantix, cut down to 0.5 pack since 11/2013  Vaping Use   Vaping Use: Never used  Substance and Sexual Activity   Alcohol use: No   Drug use: No   Sexual activity: Yes    Partners: Female  Other Topics Concern   Not on file  Social History Narrative   Lives alone.     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 in business administration   Exercise--  3-4 x a week   R handed   Caffeine: 3 c of coffee a day   Social Determinants of Health   Financial Resource Strain: Low Risk  (03/31/2021)   Overall Financial Resource Strain (CARDIA)      Difficulty of Paying Living Expenses: Not hard at all  Food Insecurity: No Food Insecurity (03/31/2021)   Hunger Vital Sign    Worried About Running Out of Food in the Last Year: Never true    Ran Out of Food in the Last Year: Never true  Transportation Needs: No Transportation Needs (03/31/2021)   PRAPARE - Hydrologist (Medical): No    Lack of Transportation (Non-Medical): No  Physical Activity: Sufficiently Active (03/31/2021)   Exercise Vital Sign    Days of Exercise per Week: 3 days    Minutes of Exercise per Session: 50 min  Stress: Stress Concern Present (03/31/2021)   [place]    Feeling of Stress : To some extent  Social Connections: Moderately Isolated (03/31/2021)   Social Connection and Isolation Panel [NHANES]    Frequency of Communication with Friends and Family: More than three times a week    Frequency of Social Gatherings with Friends and Family: More than three times a week     Attends Religious Services: More than 4 times per year    Active Member of [employer] or Organizations: Not on file    Attends Archivist Meetings: Never    Marital Status: Never married  Intimate Partner Violence: Not At Risk (03/31/2021)   Humiliation, Afraid, Rape, and Kick questionnaire    Fear of Current or Ex-Partner: No    Emotionally Abused: No    Physically Abused: No    Sexually Abused: No    Outpatient Medications Prior to Visit  Medication Sig Dispense Refill   Blood Glucose Monitoring Suppl ([place]) w/Device KIT 1 each by Does not apply route daily. Dx code: E11.9 1 kit 0   glucose blood (ONETOUCH VERIO) test strip USE 1 STRIP TO CHECK GLUCOSE ONCE DAILY 100 each 12   HYDROcodone-acetaminophen (NORCO) 10-325 MG tablet Take 1 tablet by mouth every 6 (six) hours as needed. 60 tablet 0   Multiple Vitamin (MULTIVITAMIN) tablet Take 1 tablet by mouth daily.     ONETOUCH DELICA LANCETS 76O MISC Check sugar every day.  Dx Code: E11.9 100 each 1   Probiotic Product (PROBIOTIC DAILY PO) Take 1 capsule by mouth daily.     rosuvastatin (CRESTOR) 40 MG tablet Take 1 tablet by mouth once daily 30 tablet 0   VITAMIN D PO Take 2,000 Units by mouth daily.     No facility-administered medications prior to visit.    Allergies  Allergen Reactions   Metoprolol Other (See Comments)    exhaustion   Iodinated Contrast Media    Latex    Shellfish Allergy    Ultram [Tramadol] Palpitations    Review of Systems  Constitutional:  Positive for malaise/fatigue.       (+) Weight gain  Genitourinary:  Positive for frequency and urgency (uncontrollable urination).  Psychiatric/Behavioral:         (+) Difficulty Sleeping       Objective:    Physical Exam Constitutional:      Appearance: Normal appearance. She is not ill-appearing.  HENT:     Head: Normocephalic and atraumatic.     Right Ear: External ear normal.     Left Ear: External ear normal.  Eyes:      Extraocular Movements: Extraocular movements intact.     Pupils: Pupils are equal, round, and reactive to light.  Cardiovascular:     Rate and Rhythm: Normal rate  and regular rhythm.     Pulses: Normal pulses.     Heart sounds: Normal heart sounds. No murmur heard.    No gallop.  Pulmonary:     Effort: Pulmonary effort is normal. No respiratory distress.     Breath sounds: Normal breath sounds. No wheezing or rales.  Skin:    General: Skin is warm and dry.  Neurological:     Mental Status: She is alert and oriented to person, place, and time.  Psychiatric:        Judgment: Judgment normal.     BP 112/88 (BP Location: Left Arm, Patient Position: Sitting, Cuff Size: Large)   Pulse (!) 108   Temp 97.8 F (36.6 C) (Oral)   Resp 18   Ht 5' 6.5" (1.689 m)   Wt 195 lb 12.8 oz (88.8 kg)   SpO2 99%   BMI 31.13 kg/m  Wt Readings from Last 3 Encounters:  05/19/22 195 lb 12.8 oz (88.8 kg)  04/14/22 192 lb 6.4 oz (87.3 kg)  03/06/22 192 lb 8 oz (87.3 kg)    Diabetic Foot Exam - Simple   No data filed    Lab Results  Component Value Date   WBC 9.9 04/14/2022   HGB 13.7 04/14/2022   HCT 41.3 04/14/2022   PLT 270.0 04/14/2022   GLUCOSE 64 (L) 04/14/2022   CHOL 179 01/02/2022   TRIG 88.0 01/02/2022   HDL 66.30 01/02/2022   LDLDIRECT 143.7 11/12/2013   LDLCALC 95 01/02/2022   ALT 10 04/14/2022   AST 15 04/14/2022   NA 140 04/14/2022   K 4.2 04/14/2022   CL 103 04/14/2022   CREATININE 0.97 04/14/2022   BUN 9 04/14/2022   CO2 29 04/14/2022   TSH 1.11 04/14/2022   HGBA1C 6.4 01/02/2022   MICROALBUR <0.7 01/02/2022    Lab Results  Component Value Date   TSH 1.11 04/14/2022   Lab Results  Component Value Date   WBC 9.9 04/14/2022   HGB 13.7 04/14/2022   HCT 41.3 04/14/2022   MCV 99.1 04/14/2022   PLT 270.0 04/14/2022   Lab Results  Component Value Date   NA 140 04/14/2022   K 4.2 04/14/2022   CO2 29 04/14/2022   GLUCOSE 64 (L) 04/14/2022   BUN 9 04/14/2022    CREATININE 0.97 04/14/2022   BILITOT 0.4 04/14/2022   ALKPHOS 65 04/14/2022   AST 15 04/14/2022   ALT 10 04/14/2022   PROT 7.0 04/14/2022   ALBUMIN 4.2 04/14/2022   CALCIUM 9.5 04/14/2022   ANIONGAP 10 04/07/2018   GFR 68.55 04/14/2022   Lab Results  Component Value Date   CHOL 179 01/02/2022   Lab Results  Component Value Date   HDL 66.30 01/02/2022   Lab Results  Component Value Date   LDLCALC 95 01/02/2022   Lab Results  Component Value Date   TRIG 88.0 01/02/2022   Lab Results  Component Value Date   CHOLHDL 3 01/02/2022   Lab Results  Component Value Date   HGBA1C 6.4 01/02/2022       Assessment & Plan:   Problem List Items Addressed This Visit       Unprioritized   B12 deficiency - Primary   Relevant Orders   Vitamin B12   Hyperlipidemia   Relevant Orders   CBC with Differential/Platelet   Comprehensive metabolic panel   Lipid panel   Hemoglobin A1c   TSH   Vitamin B12   VITAMIN D 25 Hydroxy (Vit-D Deficiency,  Fractures)   Lumbar degenerative disc disease   Relevant Orders   CBC with Differential/Platelet   Comprehensive metabolic panel   Lipid panel   Hemoglobin A1c   TSH   Vitamin B12   VITAMIN D 25 Hydroxy (Vit-D Deficiency, Fractures)   Fibromyalgia    Stable Work accomodation to be filled out       Essential hypertension    Well controlled, no changes to meds. Encouraged heart healthy diet such as the DASH diet and exercise as tolerated.       Relevant Orders   CBC with Differential/Platelet   Comprehensive metabolic panel   Lipid panel   Hemoglobin A1c   TSH   Vitamin B12   VITAMIN D 25 Hydroxy (Vit-D Deficiency, Fractures)   Diabetes mellitus type II, controlled (HCC)    .hgba1c to be checked , minimize simple carbs. Increase exercise as tolerated. Continue current meds       Chronic pain syndrome    From bulging disc con't pain med       Relevant Orders   CBC with Differential/Platelet   Comprehensive  metabolic panel   Lipid panel   Hemoglobin A1c   TSH   Vitamin B12   VITAMIN D 25 Hydroxy (Vit-D Deficiency, Fractures)     No orders of the defined types were placed in this encounter.   I, Yvonne R Lowne Chase, DO, personally preformed the services described in this documentation.  All medical record entries made by the scribe were at my direction and in my presence.  I have reviewed the chart and discharge instructions (if applicable) and agree that the record reflects my personal performance and is accurate and complete. 05/19/2022   I,Tinashe Williams,acting as a scribe for Yvonne R Lowne Chase, DO.,have documented all relevant documentation on the behalf of Yvonne R Lowne Chase, DO,as directed by  Yvonne R Lowne Chase, DO while in the presence of Yvonne R Lowne Chase, DO.    Yvonne R Lowne Chase, DO  

## 2022-05-19 NOTE — Assessment & Plan Note (Signed)
hgba1c to be checked, minimize simple carbs. Increase exercise as tolerated. Continue current meds  

## 2022-05-19 NOTE — Assessment & Plan Note (Signed)
Stable Work accomodation to be filled out

## 2022-05-22 ENCOUNTER — Encounter: Payer: Self-pay | Admitting: Family Medicine

## 2022-05-23 MED ORDER — HYDROCODONE-ACETAMINOPHEN 10-325 MG PO TABS: 1.0000 | ORAL_TABLET | Freq: Four times a day (QID) | ORAL | 0 refills | Status: DC | PRN

## 2022-05-23 NOTE — Telephone Encounter (Signed)
Requesting: NORCO Contract: 01/02/2022 UDS: 01/02/2022 Last OV: 05/19/2022 Next OV: 11/28/2022 Last Refill: 04/26/2022, #60--0 RF Database:   Please advise

## 2022-05-29 NOTE — Telephone Encounter (Signed)
Pt called back regarding paperwork.

## 2022-05-31 ENCOUNTER — Other Ambulatory Visit: Payer: Self-pay

## 2022-05-31 MED ORDER — VITAMIN D (ERGOCALCIFEROL) 1.25 MG (50000 UNIT) PO CAPS: 50000.0000 [IU] | ORAL_CAPSULE | ORAL | 1 refills | Status: DC

## 2022-06-19 ENCOUNTER — Other Ambulatory Visit: Payer: Self-pay | Admitting: Family Medicine

## 2022-06-19 MED ORDER — HYDROCODONE-ACETAMINOPHEN 10-325 MG PO TABS: 1.0000 | ORAL_TABLET | Freq: Four times a day (QID) | ORAL | 0 refills | Status: DC | PRN

## 2022-06-19 NOTE — Telephone Encounter (Signed)
Medication: HYDROcodone-acetaminophen (NORCO) 10-325 MG tablet    Has the patient contacted their pharmacy? No.   Preferred Pharmacy (with phone number or street name):  [place], [place] - [address] [place], [place] [address]  Phone:  [phone]  Fax:  [phone]   Agent: Please be advised that RX refills may take up to 3 business days. We ask that you follow-up with your pharmacy.

## 2022-06-19 NOTE — Telephone Encounter (Signed)
Requesting: NORCO Contract: 01/02/2022 UDS: 01/02/2022 Last OV: 05/19/2022 Next OV: 11/28/2022 Last Refill: 05/23/2022, #60--0 RF Database:   Please advise

## 2022-07-14 ENCOUNTER — Telehealth: Payer: Self-pay | Admitting: Family Medicine

## 2022-07-14 NOTE — Telephone Encounter (Signed)
Received via [place] renewal form for completion from patient.  Per note on form, pt will come pick up completed form once it is ready.  Form placed in PCP's box for pick up and completion.

## 2022-07-17 ENCOUNTER — Other Ambulatory Visit: Payer: Self-pay | Admitting: Family Medicine

## 2022-07-17 MED ORDER — HYDROCODONE-ACETAMINOPHEN 10-325 MG PO TABS: 1.0000 | ORAL_TABLET | Freq: Four times a day (QID) | ORAL | 0 refills | Status: DC | PRN

## 2022-07-17 NOTE — Telephone Encounter (Signed)
Medication: HYDROcodone-acetaminophen (NORCO) 10-325 MG tablet [[id number]]     Preferred Pharmacy (with phone number or street name): [place], [place] - [address] [place], [place] [address]  Phone:  [phone]  Fax:  [phone]   Agent: Please be advised that RX refills may take up to 3 business days. We ask that you follow-up with your pharmacy.

## 2022-07-17 NOTE — Telephone Encounter (Signed)
Requesting: NORCO Contract: 01/02/2022 UDS: 01/02/2022 Last OV: 05/19/2022 Next OV: 11/20/2022 Last Refill: 06/19/2022, #60--0 RF Database:   Please advise

## 2022-07-19 NOTE — Telephone Encounter (Signed)
Received. Partially completed. Needs sig. Placed in folder

## 2022-07-25 NOTE — Telephone Encounter (Signed)
Pt made aware that placard was ready for pick up. Placard placed at the front

## 2022-08-08 ENCOUNTER — Telehealth: Payer: Self-pay | Admitting: Family Medicine

## 2022-08-08 ENCOUNTER — Other Ambulatory Visit: Payer: Self-pay | Admitting: Family Medicine

## 2022-08-08 ENCOUNTER — Ambulatory Visit (INDEPENDENT_AMBULATORY_CARE_PROVIDER_SITE_OTHER): Payer: Medicare HMO | Admitting: Family Medicine

## 2022-08-08 ENCOUNTER — Encounter: Payer: Self-pay | Admitting: Family Medicine

## 2022-08-08 VITALS — BP 118/80 | HR 108 | Temp 98.1°F | Resp 18 | Ht 66.5 in | Wt 195.2 lb

## 2022-08-08 DIAGNOSIS — I10 Essential (primary) hypertension: Secondary | ICD-10-CM

## 2022-08-08 DIAGNOSIS — M5442 Lumbago with sciatica, left side: Secondary | ICD-10-CM

## 2022-08-08 DIAGNOSIS — M797 Fibromyalgia: Secondary | ICD-10-CM

## 2022-08-08 DIAGNOSIS — Z780 Asymptomatic menopausal state: Secondary | ICD-10-CM | POA: Diagnosis not present

## 2022-08-08 DIAGNOSIS — M5441 Lumbago with sciatica, right side: Secondary | ICD-10-CM

## 2022-08-08 DIAGNOSIS — E538 Deficiency of other specified B group vitamins: Secondary | ICD-10-CM | POA: Diagnosis not present

## 2022-08-08 DIAGNOSIS — M5136 Other intervertebral disc degeneration, lumbar region: Secondary | ICD-10-CM | POA: Diagnosis not present

## 2022-08-08 DIAGNOSIS — E1165 Type 2 diabetes mellitus with hyperglycemia: Secondary | ICD-10-CM

## 2022-08-08 DIAGNOSIS — G4709 Other insomnia: Secondary | ICD-10-CM | POA: Diagnosis not present

## 2022-08-08 DIAGNOSIS — R5383 Other fatigue: Secondary | ICD-10-CM | POA: Diagnosis not present

## 2022-08-08 DIAGNOSIS — E785 Hyperlipidemia, unspecified: Secondary | ICD-10-CM | POA: Diagnosis not present

## 2022-08-08 DIAGNOSIS — Z23 Encounter for immunization: Secondary | ICD-10-CM | POA: Diagnosis not present

## 2022-08-08 DIAGNOSIS — M791 Myalgia, unspecified site: Secondary | ICD-10-CM

## 2022-08-08 DIAGNOSIS — G8929 Other chronic pain: Secondary | ICD-10-CM

## 2022-08-08 DIAGNOSIS — E1149 Type 2 diabetes mellitus with other diabetic neurological complication: Secondary | ICD-10-CM | POA: Diagnosis not present

## 2022-08-08 LAB — CBC WITH DIFFERENTIAL/PLATELET
Basophils Absolute: 0.1 10*3/uL (ref 0.0–0.1)
Basophils Relative: 1 % (ref 0.0–3.0)
Eosinophils Absolute: 0.1 10*3/uL (ref 0.0–0.7)
Eosinophils Relative: 1.7 % (ref 0.0–5.0)
HCT: 43.7 % (ref 36.0–46.0)
Hemoglobin: 14.6 g/dL (ref 12.0–15.0)
Lymphocytes Relative: 22.7 % (ref 12.0–46.0)
Lymphs Abs: 1.9 10*3/uL (ref 0.7–4.0)
MCHC: 33.4 g/dL (ref 30.0–36.0)
MCV: 98.4 fl (ref 78.0–100.0)
Monocytes Absolute: 0.5 10*3/uL (ref 0.1–1.0)
Monocytes Relative: 5.6 % (ref 3.0–12.0)
Neutro Abs: 5.7 10*3/uL (ref 1.4–7.7)
Neutrophils Relative %: 69 % (ref 43.0–77.0)
Platelets: 270 10*3/uL (ref 150.0–400.0)
RBC: 4.45 Mil/uL (ref 3.87–5.11)
RDW: 13.3 % (ref 11.5–15.5)
WBC: 8.3 10*3/uL (ref 4.0–10.5)

## 2022-08-08 LAB — COMPREHENSIVE METABOLIC PANEL
ALT: 9 U/L (ref 0–35)
AST: 14 U/L (ref 0–37)
Albumin: 4.5 g/dL (ref 3.5–5.2)
Alkaline Phosphatase: 66 U/L (ref 39–117)
BUN: 9 mg/dL (ref 6–23)
CO2: 28 mEq/L (ref 19–32)
Calcium: 9.7 mg/dL (ref 8.4–10.5)
Chloride: 102 mEq/L (ref 96–112)
Creatinine, Ser: 0.96 mg/dL (ref 0.40–1.20)
GFR: 69.25 mL/min (ref >60.00)
Glucose, Bld: 96 mg/dL (ref 70–99)
Potassium: 4.9 mEq/L (ref 3.5–5.1)
Sodium: 139 mEq/L (ref 135–145)
Total Bilirubin: 0.4 mg/dL (ref 0.2–1.2)
Total Protein: 7.4 g/dL (ref 6.0–8.3)

## 2022-08-08 LAB — LIPID PANEL
Cholesterol: 173 mg/dL (ref 0–200)
HDL: 61.1 mg/dL (ref >39.00)
LDL Cholesterol: 95 mg/dL (ref 0–99)
NonHDL: 112.11
Total CHOL/HDL Ratio: 3
Triglycerides: 86 mg/dL (ref 0.0–149.0)
VLDL: 17.2 mg/dL (ref 0.0–40.0)

## 2022-08-08 LAB — VITAMIN B12: Vitamin B-12: 344 pg/mL (ref 211–911)

## 2022-08-08 LAB — TSH: TSH: 0.94 u[IU]/mL (ref 0.35–5.50)

## 2022-08-08 LAB — VITAMIN D 25 HYDROXY (VIT D DEFICIENCY, FRACTURES): VITD: 34.82 ng/mL (ref 30.00–100.00)

## 2022-08-08 MED ORDER — TIRZEPATIDE 2.5 MG/0.5ML ~~LOC~~ SOAJ: 2.5000 mg | SUBCUTANEOUS | 1 refills | Status: DC

## 2022-08-08 NOTE — Progress Notes (Signed)
Subjective:   By signing my name below, I, [name], attest that this documentation has been prepared under the direction and in the presence of [name], DO. 08/08/2022    Patient ID: Emily Maddox, female    DOB: 1971/07/19, 51 y.o.   MRN: [id number]  Chief Complaint  Patient presents with   Sleeping Problem   Leg Pain    Leg Pain  Associated symptoms include tingling (neck).   Patient is in today for a office visit.   She complains of neck pain with burning and tingling pain.  She continues complaining of fatigue, headache, and leg pain. Her leg pain has worsened since she started working and she thinks it is due to sitting for long periods. She has difficulty sleeping at night. She continues gaining weight. She continues having hot flashes. She has completed a sleep study and found she did not have sleep apnea. Her other provider thought her insomnia is due to her pain.  Wt Readings from Last 3 Encounters:  08/08/22 195 lb 3.2 oz (88.5 kg)  05/19/22 195 lb 12.8 oz (88.8 kg)  04/14/22 192 lb 6.4 oz (87.3 kg)   She continues having urinary incontinence. She is following up with her GYN specialist.  She continues monitoring her blood sugars at home and reports they are stable.  Lab Results  Component Value Date   HGBA1C 6.2 05/19/2022   Her blood pressure is stable but her heart rate is elevated. This could be due to her current pain.  BP Readings from Last 3 Encounters:  08/08/22 118/80  05/19/22 112/88  04/14/22 116/69   Pulse Readings from Last 3 Encounters:  08/08/22 (!) 108  05/19/22 (!) 108  04/14/22 (!) 103   She is interested in receiving the flu vaccine during this visit.    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   Diabetes mellitus    Hyperlipidemia    Tachycardia     Past Surgical History:  Procedure Laterality Date   BREAST EXCISIONAL BIOPSY  Left 2003    Family History  Problem Relation Age of Onset   Cancer Mother 56       breast   Hypertension Mother    Hyperlipidemia Mother    Diabetes Mother    Breast cancer Mother 62   Cancer Father 18       colon   Hypertension Father    Hyperlipidemia Father    Heart disease Father        a fib   Diabetes Father    Diabetes Sister    Breast cancer Other    Colon cancer Other     Social History   Socioeconomic History   Marital status: Single    Spouse name: Not on file   Number of children: Not on file   Years of education: Not on file   Highest education level: Bachelor's degree (e.g., BA, AB, BS)  Occupational History    Employer: UNEMPLOYED  Tobacco Use   Smoking status: Every Day    Packs/day: 1.00    Years: 19.00    Total pack years: 19.00    Types: Cigarettes   Smokeless tobacco: Never   Tobacco comments:    couldn't tolerate chantix, cut down to 0.5 pack since 11/2013  Vaping Use   Vaping Use: Never used  Substance and Sexual Activity   Alcohol use: No  Drug use: No   Sexual activity: Yes    Partners: Female  Other Topics Concern   Not on file  Social History Narrative   Lives alone.     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 in business administration   Exercise--  3-4 x a week   R handed   Caffeine: 3 c of coffee a day   Social Determinants of Health   Financial Resource Strain: Low Risk  (03/31/2021)   Overall Financial Resource Strain (CARDIA)    Difficulty of Paying Living Expenses: Not hard at all  Food Insecurity: No Food Insecurity (03/31/2021)   Hunger Vital Sign    Worried About Running Out of Food in the Last Year: Never true    Ran Out of Food in the Last Year: Never true  Transportation Needs: No Transportation Needs (03/31/2021)   PRAPARE - Hydrologist (Medical): No    Lack of Transportation (Non-Medical): No  Physical Activity: Sufficiently Active (03/31/2021)   Exercise Vital Sign    Days of Exercise  per Week: 3 days    Minutes of Exercise per Session: 50 min  Stress: Stress Concern Present (03/31/2021)   [place]    Feeling of Stress : To some extent  Social Connections: Moderately Isolated (03/31/2021)   Social Connection and Isolation Panel [NHANES]    Frequency of Communication with Friends and Family: More than three times a week    Frequency of Social Gatherings with Friends and Family: More than three times a week    Attends Religious Services: More than 4 times per year    Active Member of [employer] or Organizations: Not on file    Attends Archivist Meetings: Never    Marital Status: Never married  Intimate Partner Violence: Not At Risk (03/31/2021)   Humiliation, Afraid, Rape, and Kick questionnaire    Fear of Current or Ex-Partner: No    Emotionally Abused: No    Physically Abused: No    Sexually Abused: No    Outpatient Medications Prior to Visit  Medication Sig Dispense Refill   Blood Glucose Monitoring Suppl ([place]) w/Device KIT 1 each by Does not apply route daily. Dx code: E11.9 1 kit 0   glucose blood (ONETOUCH VERIO) test strip USE 1 STRIP TO CHECK GLUCOSE ONCE DAILY 100 each 12   HYDROcodone-acetaminophen (NORCO) 10-325 MG tablet Take 1 tablet by mouth every 6 (six) hours as needed. 60 tablet 0   Multiple Vitamin (MULTIVITAMIN) tablet Take 1 tablet by mouth daily.     ONETOUCH DELICA LANCETS 53M MISC Check sugar every day.  Dx Code: E11.9 100 each 1   Probiotic Product (PROBIOTIC DAILY PO) Take 1 capsule by mouth daily.     rosuvastatin (CRESTOR) 40 MG tablet Take 1 tablet by mouth once daily 30 tablet 0   VITAMIN D PO Take 2,000 Units by mouth daily.     Vitamin D, Ergocalciferol, (DRISDOL) 1.25 MG (50000 UNIT) CAPS capsule Take 1 capsule (50,000 Units total) by mouth every 7 (seven) days. 12 capsule 1   No facility-administered medications prior to visit.    Allergies   Allergen Reactions   Metoprolol Other (See Comments)    exhaustion   Iodinated Contrast Media    Latex    Shellfish Allergy    Ultram [Tramadol] Palpitations    Review of Systems  Constitutional:  Positive for malaise/fatigue.  Musculoskeletal:  Positive  for neck pain ((+)burning neck pain).       (+)bilateral leg pain  Neurological:  Positive for tingling (neck) and headaches.  Psychiatric/Behavioral:  The patient has insomnia.        Objective:    Physical Exam Constitutional:      General: She is not in acute distress.    Appearance: Normal appearance. She is not ill-appearing.  HENT:     Head: Normocephalic and atraumatic.     Right Ear: External ear normal.     Left Ear: External ear normal.  Eyes:     Extraocular Movements: Extraocular movements intact.     Pupils: Pupils are equal, round, and reactive to light.  Cardiovascular:     Rate and Rhythm: Normal rate and regular rhythm.     Heart sounds: Normal heart sounds. No murmur heard.    No gallop.  Pulmonary:     Effort: Pulmonary effort is normal. No respiratory distress.     Breath sounds: Normal breath sounds. No wheezing or rales.  Skin:    General: Skin is warm and dry.  Neurological:     Mental Status: She is alert and oriented to person, place, and time.  Psychiatric:        Judgment: Judgment normal.     BP 118/80 (BP Location: Left Arm, Patient Position: Sitting, Cuff Size: Large)   Pulse (!) 108   Temp 98.1 F (36.7 C) (Oral)   Resp 18   Ht 5' 6.5" (1.689 m)   Wt 195 lb 3.2 oz (88.5 kg)   SpO2 99%   BMI 31.03 kg/m  Wt Readings from Last 3 Encounters:  08/08/22 195 lb 3.2 oz (88.5 kg)  05/19/22 195 lb 12.8 oz (88.8 kg)  04/14/22 192 lb 6.4 oz (87.3 kg)    Diabetic Foot Exam - Simple   No data filed    Lab Results  Component Value Date   WBC 8.2 05/19/2022   HGB 13.9 05/19/2022   HCT 42.0 05/19/2022   PLT 297.0 05/19/2022   GLUCOSE 78 05/19/2022   CHOL 169 05/19/2022   TRIG  69.0 05/19/2022   HDL 61.80 05/19/2022   LDLDIRECT 143.7 11/12/2013   LDLCALC 93 05/19/2022   ALT 8 05/19/2022   AST 13 05/19/2022   NA 139 05/19/2022   K 4.7 05/19/2022   CL 104 05/19/2022   CREATININE 0.89 05/19/2022   BUN 11 05/19/2022   CO2 28 05/19/2022   TSH 1.18 05/19/2022   HGBA1C 6.2 05/19/2022   MICROALBUR <0.7 01/02/2022    Lab Results  Component Value Date   TSH 1.18 05/19/2022   Lab Results  Component Value Date   WBC 8.2 05/19/2022   HGB 13.9 05/19/2022   HCT 42.0 05/19/2022   MCV 98.7 05/19/2022   PLT 297.0 05/19/2022   Lab Results  Component Value Date   NA 139 05/19/2022   K 4.7 05/19/2022   CO2 28 05/19/2022   GLUCOSE 78 05/19/2022   BUN 11 05/19/2022   CREATININE 0.89 05/19/2022   BILITOT 0.3 05/19/2022   ALKPHOS 63 05/19/2022   AST 13 05/19/2022   ALT 8 05/19/2022   PROT 7.0 05/19/2022   ALBUMIN 4.2 05/19/2022   CALCIUM 9.4 05/19/2022   ANIONGAP 10 04/07/2018   GFR 75.96 05/19/2022   Lab Results  Component Value Date   CHOL 169 05/19/2022   Lab Results  Component Value Date   HDL 61.80 05/19/2022   Lab Results  Component Value Date  [place] 93 05/19/2022   Lab Results  Component Value Date   TRIG 69.0 05/19/2022   Lab Results  Component Value Date   CHOLHDL 3 05/19/2022   Lab Results  Component Value Date   HGBA1C 6.2 05/19/2022       Assessment & Plan:   Problem List Items Addressed This Visit   None    No orders of the defined types were placed in this encounter.   I, Shehryar [place], personally preformed the services described in this documentation.  All medical record entries made by the scribe were at my direction and in my presence.  I have reviewed the chart and discharge instructions (if applicable) and agree that the record reflects my personal performance and is accurate and complete. 08/08/2022   I,[name],acting as a scribe for [name], DO.,have documented all relevant documentation  on the behalf of [name], DO,as directed by  [name], DO while in the presence of [name], DO.   Shehryar [employer]

## 2022-08-08 NOTE — Telephone Encounter (Signed)
Pt was seen today and forgot to ask the provider if she can get a referral for a Neurologist, pt mentioned her sleep doctor recommended pt needed to see Neurologist and send the information to PCP, pt wanted to know if provider received the info and would like to have the referral put in for Neuro. Please advise.

## 2022-08-09 NOTE — Assessment & Plan Note (Signed)
Due to worsening pain

## 2022-08-09 NOTE — Assessment & Plan Note (Signed)
Encourage heart healthy diet such as MIND or DASH diet, increase exercise, avoid trans fats, simple carbohydrates and processed foods, consider a krill or fish or flaxseed oil cap daily.  °

## 2022-08-09 NOTE — Assessment & Plan Note (Signed)
Pain not controlled  Refer to pain management per neuro

## 2022-08-09 NOTE — Assessment & Plan Note (Signed)
Pt struggles with worsening pain and trying to work and lead a productive life We have discussed management in past but pt was doing ok with current regimen---pain now worsening due to work  Refer to pain management and neuro for help with this

## 2022-08-09 NOTE — Assessment & Plan Note (Signed)
Hx chronic pain---- pain worsening --- refer to neuromuscular specialist  Refer to pain management

## 2022-08-09 NOTE — Assessment & Plan Note (Signed)
hgba1c to be checked, minimize simple carbs. Increase exercise as tolerated. Continue current meds  

## 2022-08-10 ENCOUNTER — Telehealth: Payer: Medicare HMO

## 2022-08-10 ENCOUNTER — Other Ambulatory Visit: Payer: Self-pay | Admitting: Family Medicine

## 2022-08-10 DIAGNOSIS — M791 Myalgia, unspecified site: Secondary | ICD-10-CM

## 2022-08-10 DIAGNOSIS — G8929 Other chronic pain: Secondary | ICD-10-CM

## 2022-08-10 NOTE — Telephone Encounter (Signed)
PA initiated via Covermymeds; KEY: [id number]. Awaiting determination.

## 2022-08-14 ENCOUNTER — Other Ambulatory Visit: Payer: Self-pay | Admitting: Family Medicine

## 2022-08-14 NOTE — Telephone Encounter (Signed)
PA denied.   You have tried the formulary drugs for the treatment of your condition and they did not work for you. OR - The formulary drugs could cause adverse effects. OR - The formulary drugs would be less effective for your condition than the requested drug.   Formulary alternatives:  Rybelsus tablet (requires prior authorization) (quantity limit of 30 tablets every 30 days) Victoza pen-injector (requires prior authorization) (quantity limit of 9 mL every 30 days) Bydureon BCise auto-injector (requires prior authorization) (quantity limit of 3.40 mL every 28 days) Byetta pen-injector (requires prior authorization) (Different quantity limits apply depending on the strength of  the medication prescribed. Please consult the Medicare Part D plan's formulary for the specific quantity limit.) Trulicity pen-injector (requires prior authorization) (quantity limit of 2 mL every 28 days) Xultophy 100/3.6 pen-injector (quantity limit of 15 mL every 30 days) Soliqua 100/33 pen-injector (quantity limit of 15 mL every 25 days).

## 2022-08-14 NOTE — Telephone Encounter (Signed)
Requesting: Hydrocodone Contract:  01/02/2022 UDS: 01/02/2022 Last OV: 08/08/2022 Next OV: 11/20/2022 Last Refill: 07/17/2022, #60--0 RF Database:   Please advise

## 2022-08-14 NOTE — Telephone Encounter (Signed)
Pt called requesting refill on hydrocodone.  Pharmacy is [facility] in [place], [place].

## 2022-08-15 LAB — ESTRADIOL, ULTRA SENS: Estradiol, Ultra Sensitive: 6 pg/mL

## 2022-08-15 LAB — TESTOS,TOTAL,FREE AND SHBG (FEMALE)
Free Testosterone: 1.7 pg/mL (ref 0.1–6.4)
Sex Hormone Binding: 128.2 nmol/L — ABNORMAL HIGH (ref 17–124)
Testosterone, Total, LC-MS-MS: 30 ng/dL (ref 2–45)

## 2022-08-15 MED ORDER — HYDROCODONE-ACETAMINOPHEN 10-325 MG PO TABS: 1.0000 | ORAL_TABLET | Freq: Four times a day (QID) | ORAL | 0 refills | Status: DC | PRN

## 2022-08-16 NOTE — Telephone Encounter (Signed)
Spoke with patient and she advised that she had another insurance card and they may cover it.  So we will keep Mounjaro and she will give her pharmacy the other card and see if it will cover.

## 2022-08-17 ENCOUNTER — Telehealth: Payer: Self-pay | Admitting: *Deleted

## 2022-08-17 NOTE — Telephone Encounter (Signed)
Prior auth approved and left detailed message on machine that it was approved.  The authorization is valid from 07/18/2022 through 08/17/2023

## 2022-08-17 NOTE — Telephone Encounter (Signed)
Prior auth started via cover my meds.  Awaiting determination.  Key: [id number]

## 2022-08-21 DIAGNOSIS — N959 Unspecified menopausal and perimenopausal disorder: Secondary | ICD-10-CM | POA: Diagnosis not present

## 2022-08-21 DIAGNOSIS — D259 Leiomyoma of uterus, unspecified: Secondary | ICD-10-CM | POA: Diagnosis not present

## 2022-08-21 DIAGNOSIS — R32 Unspecified urinary incontinence: Secondary | ICD-10-CM | POA: Diagnosis not present

## 2022-08-25 DIAGNOSIS — M797 Fibromyalgia: Secondary | ICD-10-CM | POA: Diagnosis not present

## 2022-08-25 DIAGNOSIS — M5416 Radiculopathy, lumbar region: Secondary | ICD-10-CM | POA: Diagnosis not present

## 2022-08-25 DIAGNOSIS — M5136 Other intervertebral disc degeneration, lumbar region: Secondary | ICD-10-CM | POA: Diagnosis not present

## 2022-09-04 ENCOUNTER — Encounter: Payer: Self-pay | Admitting: Physical Medicine & Rehabilitation

## 2022-09-05 LAB — STATUS REPORT

## 2022-09-06 LAB — HORMONE PANEL (T4,TSH,FSH,TESTT,SHBG,DHEA,ETC)
DHEA-Sulfate, LCMS: 24 ug/dL
Estradiol, Serum, MS: 7.4 pg/mL
Free T-3: 3.1 pg/mL
Free Testosterone, Serum: 1.6 pg/mL
Progesterone, Serum: <10 ng/dL
Sex Hormone Binding Globulin: 135 nmol/L — ABNORMAL HIGH
T4: 8.8 ug/dL
TSH: 1.2 uU/mL
Testosterone, Serum (Total): 31 ng/dL
Testosterone-% Free: 0.5 %
Triiodothyronine (T-3), Serum: 103 ng/dL

## 2022-09-18 ENCOUNTER — Telehealth: Payer: Self-pay | Admitting: Family Medicine

## 2022-09-18 MED ORDER — HYDROCODONE-ACETAMINOPHEN 10-325 MG PO TABS: 1.0000 | ORAL_TABLET | Freq: Four times a day (QID) | ORAL | 0 refills | Status: DC | PRN

## 2022-09-18 NOTE — Telephone Encounter (Signed)
Medication:  1.HYDROcodone-acetaminophen (NORCO) 10-325 MG tablet  2.rosuvastatin (CRESTOR) 40 MG tablet    Has the patient contacted their pharmacy? No.  Preferred Pharmacy (with phone number or street name):  [facility] [address], [place] - [address] [place], [place] [address] Phone: [phone]  Fax: [phone]

## 2022-09-18 NOTE — Telephone Encounter (Signed)
Chief Complaint Prescription Refill or Medication Request (non symptomatic) Reason for Call Medication Question / Request Initial Comment Caller states she needs a refill on her meds that she will be out of over the weekend. Translation No Disp. Time Eilene Ghazi Time) Disposition Final User 09/15/2022 8:21:02 AM Attempt made - message left Lynett Fish 09/15/2022 8:53:45 AM FINAL ATTEMPT MADE - message left Yes Eugenio Hoes RN, Jenny Reichmann Final Disposition 09/15/2022 8:53:45 AM FINAL ATTEMPT MADE - message left Yes Eugenio Hoes, RN, Jenny Reichmann

## 2022-09-18 NOTE — Telephone Encounter (Signed)
Nurse Assessment Nurse: Lucky Cowboy, RN, Levada Dy Date/Time (Eastern Time): 09/15/2022 10:18:01 AM Confirm and document reason for call. If symptomatic, describe symptoms. ---Caller stated that she takes Hydrocodone for back & neck pain. She will run out over the weekend. She missed the earlier call from the nurse. Denies any new/ worsening s/s. Does the patient have any new or worsening symptoms? ---No Guidelines Guideline Title Affirmed Question Affirmed Notes Nurse Date/Time Eilene Ghazi Time) Neck Pain or Stiffness [1] MODERATE neck pain (e.g., interferes with normal activities) AND [2] present > 3 days Lucky Cowboy, RN, Levada Dy 09/15/2022 10:19:14 AM Disp. Time Eilene Ghazi Time) Disposition Final User 09/15/2022 10:23:19 AM SEE PCP WITHIN 3 DAYS Yes Dew, RN, Levada Dy Final Disposition 09/15/2022 10:23:19 AM SEE PCP WITHIN 3 DAYS Yes Dew, RN, Marin Shutter Disagree/Comply Comply Caller Understands Yes PLEASE NOTE: All timestamps contained within this report are represented as [place] Standard Time. CONFIDENTIALTY NOTICE: This fax transmission is intended only for the addressee. It contains information that is legally privileged, confidential or otherwise protected from use or disclosure. If you are not the intended recipient, you are strictly prohibited from reviewing, disclosing, copying using or disseminating any of this information or taking any action in reliance on or regarding this information. If you have received this fax in error, please notify us immediately by telephone so that we can arrange for its return to [place]. Phone: [phone], Toll-Free: [phone], Fax: [phone] Page: 2 of 2 Call Id: [id number] PreDisposition Call Doctor Care Advice Given Per Guideline SEE PCP WITHIN 3 DAYS: * You need to be seen within 2 or 3 days. * PCP VISIT: Call your doctor (or NP/PA) during regular office hours and make an appointment. A clinic or urgent care center are good places to go for care if your  doctor's office is closed or you can't get an appointment. NOTE: If office will be open tomorrow, tell caller to call then, not in 3 days. PAIN MEDICINES: * For pain relief, you can take either acetaminophen, ibuprofen, or naproxen. * They are over-the-counter (OTC) pain drugs. You can buy them at the drugstore. SLEEP: * Sleep on the back or side, not the abdomen. * Sleep with a neck collar. Use a foam neck collar (from a pharmacy) OR a small towel wrapped around the neck. Reason: Keep the head from moving too much during sleep. AVOID: * Avoid activity that puts stress on the neck. * Do not work with your neck turned or bent back. * Do not carry heavy objects on your head. * Do not carry heavy objects with one arm. Use both arms instead. CALL BACK IF: * You become worse CARE ADVICE given per Neck Pain (Adult) guideline. Referrals REFERRED TO PCP OFFICE

## 2022-09-18 NOTE — Telephone Encounter (Signed)
Requesting: NORCO Contract: 01/02/2022 UDS: 01/02/2022 Last OV: 08/08/2022 Next OV: 11/20/2022 Last Refill: 08/15/2022, #60--0 RF Database:   Please advise

## 2022-09-18 NOTE — Addendum Note (Signed)
Addended by: Sanda Linger on: 09/18/2022 02:45 PM   Modules accepted: Orders

## 2022-09-21 NOTE — Telephone Encounter (Signed)
Pt called to follow up on med refill request stating she had not heard anything. Advised pt that the Orting had been sent in but for some reason the rosuvastatin had not. Advised that a high priority note would be sent back to get this corrected. Pt acknowledged understanding.

## 2022-09-21 NOTE — Telephone Encounter (Signed)
Patient called back to advise that she is taking the rosuvastatin and the pharmacy is indicating additional information is needed for the hydrocodone but she does not know what that is.

## 2022-09-21 NOTE — Telephone Encounter (Signed)
Pt called. LVM. Pt was advised to return call to discuss medication. Med hasn't been refilled since Jan 2023.

## 2022-09-22 MED ORDER — ROSUVASTATIN CALCIUM 40 MG PO TABS: 40.0000 mg | ORAL_TABLET | Freq: Every day | ORAL | 0 refills | Status: DC

## 2022-09-22 NOTE — Addendum Note (Signed)
Addended by: Sanda Linger on: 09/22/2022 09:18 AM   Modules accepted: Orders

## 2022-10-04 ENCOUNTER — Telehealth: Payer: Medicare HMO

## 2022-10-04 NOTE — Telephone Encounter (Signed)
PA initiated via Covermymeds; KEY: [id number]. PA approved.   The authorization is valid from 09/04/2022 through 04/02/2023

## 2022-10-17 NOTE — Telephone Encounter (Signed)
Pt no longer has [employer], just bcbs. She stated she will need a new PA.

## 2022-10-19 NOTE — Telephone Encounter (Signed)
BCBS- PA initiated via Covermymeds;KEY: [facility]    Your PA has been resolved, no additional PA is required. For further inquiries please contact the number on the back of the member prescription card. (Message [phone])

## 2022-10-20 ENCOUNTER — Encounter
Payer: Federal, State, Local not specified - PPO | Attending: Physical Medicine & Rehabilitation | Admitting: Physical Medicine & Rehabilitation

## 2022-10-20 ENCOUNTER — Other Ambulatory Visit: Payer: Self-pay | Admitting: Family Medicine

## 2022-10-20 ENCOUNTER — Encounter: Payer: Self-pay | Admitting: Physical Medicine & Rehabilitation

## 2022-10-20 ENCOUNTER — Encounter: Payer: Federal, State, Local not specified - PPO | Admitting: Physical Medicine & Rehabilitation

## 2022-10-20 VITALS — BP 115/83 | HR 85 | Ht 66.0 in | Wt 196.0 lb

## 2022-10-20 DIAGNOSIS — M25512 Pain in left shoulder: Secondary | ICD-10-CM | POA: Insufficient documentation

## 2022-10-20 DIAGNOSIS — M5136 Other intervertebral disc degeneration, lumbar region: Secondary | ICD-10-CM | POA: Diagnosis not present

## 2022-10-20 DIAGNOSIS — Z1231 Encounter for screening mammogram for malignant neoplasm of breast: Secondary | ICD-10-CM

## 2022-10-20 DIAGNOSIS — G8929 Other chronic pain: Secondary | ICD-10-CM | POA: Insufficient documentation

## 2022-10-20 DIAGNOSIS — M25511 Pain in right shoulder: Secondary | ICD-10-CM | POA: Diagnosis not present

## 2022-10-20 NOTE — Progress Notes (Signed)
Subjective:    Patient ID: Emily Maddox, female    DOB: 06-Jul-1971, 51 y.o.   MRN: [id number]  HPI 51 year old female with multiple pain complaints Chief complaint is low back pain, chronic Patient states she has had low back pain for much of her adult life.  She was seen in this clinic in 2007 and 2008.  Based on prior evaluation her original injury was with a fall in 1999 right-sided back and buttock pain since that time.  Had lumbar epidural injection in [place] prior to relocating to [place] around 2006.  Also had thoracic epidural which was not helpful.  MRI of the thoracic spine showed mild central disc bulge at T6-T7 area.  MRI lumbar showed no disc herniation at that time.  Had a right sacroiliac joint injection October 5027 gave her temporary relief of symptoms.  Had gone through outpatient therapy September 04, 2016 January 08/2007.  She was treated with Ultracet previously 1 p.o. twice daily she has been seeing her primary physician who has been prescribing hydrocodone 10 mg twice daily She has pain that goes into her thighs but this is not always related to her back pain. 2015 Lumbar discogram recreated Bilateral ant thigh pain 2016 lumbar MRI showed lumbar degenerative disc at L4-5 no significant spinal stenosis  Left hand and wrist pain, no sig tingling , symptoms worse at night and with driving, no injury to left hand she is right-handed.  Her work activities include a lot of typing.  Was exercising a lot until COVID, feels like she has gotten weaker since that time.  Works for [employer] started back full-time a couple years ago.  Left greater than right shoulder pain when elevating arm, no prior shoulder injury or surgeries.   Pain Inventory Average Pain 5 Pain Right Now 5 My pain is burning and aching  In the last 24 hours, has pain interfered with the following? General activity 7 Relation with others 7 Enjoyment of life 7 What TIME of day is your pain at  its worst? morning , evening, and night Sleep (in general) Poor  Pain is worse with: walking, sitting, and standing Pain improves with: rest and heat/ice Relief from Meds: 5  walk without assistance how many minutes can you walk? depends ability to climb steps?  yes do you drive?  yes Do you have any goals in this area?  yes  employed # of hrs/week 40  bladder control problems weakness trouble walking dizziness  Any changes since last visit?  no  Any changes since last visit?  no    Family History  Problem Relation Age of Onset   Cancer Mother 38       breast   Hypertension Mother    Hyperlipidemia Mother    Diabetes Mother    Breast cancer Mother 50   Cancer Father 53       colon   Hypertension Father    Hyperlipidemia Father    Heart disease Father        a fib   Diabetes Father    Diabetes Sister    Breast cancer Other    Colon cancer Other    Social History   Socioeconomic History   Marital status: Single    Spouse name: Not on file   Number of children: Not on file   Years of education: Not on file   Highest education level: Bachelor's degree (e.g., BA, AB, BS)  Occupational History    Employer:  UNEMPLOYED  Tobacco Use   Smoking status: Every Day    Packs/day: 1.00    Years: 19.00    Total pack years: 19.00    Types: Cigarettes   Smokeless tobacco: Never   Tobacco comments:    couldn't tolerate chantix, cut down to 0.5 pack since 11/2013  Vaping Use   Vaping Use: Never used  Substance and Sexual Activity   Alcohol use: No   Drug use: No   Sexual activity: Yes    Partners: Female  Other Topics Concern   Not on file  Social History Narrative   Lives alone.     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 in business administration   Exercise--  3-4 x a week   R handed   Caffeine: 3 c of coffee a day   Social Determinants of Health   Financial Resource Strain: Low Risk  (03/31/2021)   Overall Financial Resource Strain (CARDIA)    Difficulty of  Paying Living Expenses: Not hard at all  Food Insecurity: No Food Insecurity (03/31/2021)   Hunger Vital Sign    Worried About Running Out of Food in the Last Year: Never true    Ran Out of Food in the Last Year: Never true  Transportation Needs: No Transportation Needs (03/31/2021)   PRAPARE - Hydrologist (Medical): No    Lack of Transportation (Non-Medical): No  Physical Activity: Sufficiently Active (03/31/2021)   Exercise Vital Sign    Days of Exercise per Week: 3 days    Minutes of Exercise per Session: 50 min  Stress: Stress Concern Present (03/31/2021)   [place]    Feeling of Stress : To some extent  Social Connections: Moderately Isolated (03/31/2021)   Social Connection and Isolation Panel [NHANES]    Frequency of Communication with Friends and Family: More than three times a week    Frequency of Social Gatherings with Friends and Family: More than three times a week    Attends Religious Services: More than 4 times per year    Active Member of [employer] or Organizations: Not on file    Attends Archivist Meetings: Never    Marital Status: Never married   Past Surgical History:  Procedure Laterality Date   BREAST EXCISIONAL BIOPSY Left 2003   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   Diabetes mellitus    Hyperlipidemia    Tachycardia    Ht '5\' 6"'$  (1.676 m)   BMI 31.51 kg/m   Opioid Risk Score:   Fall Risk Score:  `1  Depression screen [facility] 2/9     04/14/2022    9:54 AM 04/06/2022    3:45 PM 03/31/2021    3:05 PM 03/29/2020    2:13 PM 01/28/2019    3:17 PM 11/14/2018    3:23 PM 03/06/2017   10:30 AM  Depression screen PHQ 2/9  Decreased Interest 2 0 0 0 0 0 0  Down, Depressed, Hopeless 0 0 0 0 0 0 0  PHQ - 2 Score 2 0 0 0 0 0 0  Altered sleeping 3     2   Tired, decreased energy 3     2    Change in appetite 0     0   Feeling bad or failure about yourself  0     0   Trouble  concentrating 3     0   Moving slowly or fidgety/restless 0     0   Suicidal thoughts 0     0   PHQ-9 Score 11     4   Difficult doing work/chores Extremely dIfficult     Very difficult       Review of Systems  Musculoskeletal:  Positive for back pain and neck pain.       B/L shoulder pain  Left hand pain  B/L thigh pain   All other systems reviewed and are negative.     Objective:   Physical Exam  General no acute distress Mood and affect are appropriate Extremities without edema Negative straight leg raising bilaterally Motor strength is 5/5 bilateral deltoid bicep tricep grip hip flexor knee extensor ankle dorsiflexor Left wrist negative Tinel's negative Phalen's no sensory loss on light touch evaluation. No thenar atrophy. Bilateral shoulders have impingement signs at 90 degrees.  Has full range of motion negative drop arm test No evidence of joint deformity at the shoulders. Low back has tenderness palpation right greater left side lumbar paraspinals going into the PSIS area. Positive thigh thrust test bilaterally Positive Faber's bilaterally at the SI area Positive compression test.  Negative distraction test There is pain over the greater trochanters bilaterally       Assessment & Plan:  1.  Chronic low back pain imaging studies shows degenerative disc as well as annular tear noted on discogram lower lumbar disks. No significant sciatic symptoms no clinical signs of radiculopathy.  Anterior thigh pain most likely radiation pattern from L5-S1 disc. Will start in aquatic exercise program, I do think in the long-term she will do best with community-based program. Will order x-rays of the lumbar spine 2.  Bilateral shoulder impingement may have degenerative changes as well we will check shoulder x-rays 3.  Left wrist and hand pain no clear-cut exam signs of carpal tunnel but  musculoskeletal exam is otherwise negative.  Will get EMG/NCV to further evaluate.

## 2022-10-20 NOTE — Patient Instructions (Signed)
Your physician has ordered a Nerve Conduction Study (NCV) and/or EMG testing.  This is a test to assess the status of your nerves and muscles. For the NCV portion of the test , sticky tabs will be placed on either your hands or feet.  Your nerves will be stimulated using small electrical charges and the speed of the impulse will be measured as it travels down the nerve. For the EMG portion of the test, a small pin will be placed below the surface of the skin to measure the electrical activity in certain muscles in your arms or legs. Eating/drinking prior to testing is ok.  Please DO NOT discontinue ANY medications prior to testing  Your appointment will be scheduled by a member of our team.  You will need to check in with the main reception desk. Your test could take approximately 30 minutes to 1 hour to complete depending on the extent of the test that has been ordered. TESTING ON LEGS/BACK/HIP/FOOT AREA: Bring/wear a pair of shorts.  **ABSOLUTELY NO LOTIONS, MOISTURIZERS, VASELINE, OILS OR CREAMS OF ANY KIND ON ANY BODY PART THE DAY OF THE TEST**  **DEODORANT IS OK TO WEAR**

## 2022-10-25 ENCOUNTER — Other Ambulatory Visit: Payer: Self-pay | Admitting: Family Medicine

## 2022-10-25 MED ORDER — HYDROCODONE-ACETAMINOPHEN 10-325 MG PO TABS: 1.0000 | ORAL_TABLET | Freq: Four times a day (QID) | ORAL | 0 refills | Status: DC | PRN

## 2022-10-25 NOTE — Telephone Encounter (Signed)
Requesting: NORCO Contract: 01/02/2022 UDS: 01/02/2022 Last OV: 08/08/2022 Next OV: 11/10/2022 Last Refill: 09/18/2022, #60--0 RF Database:   Please advise

## 2022-10-25 NOTE — Telephone Encounter (Signed)
Prescription Request  10/25/2022  Is this a "Controlled Substance" medicine? Yes  LOV: 08/08/2022  What is the name of the medication or equipment? HYDROcodone-acetaminophen (NORCO) 10-325 MG tablet [[id number]]   Have you contacted your pharmacy to request a refill? No   Which pharmacy would you like this sent to?  [facility], [address] [place], [place] [address] Phone: [phone]  Fax: [phone] DEA #: --   Patient notified that their request is being sent to the clinical staff for review and that they should receive a response within 2 business days.   Please advise at Mobile [phone] (mobile)

## 2022-10-30 ENCOUNTER — Encounter: Payer: Self-pay | Admitting: Family Medicine

## 2022-10-30 ENCOUNTER — Ambulatory Visit (INDEPENDENT_AMBULATORY_CARE_PROVIDER_SITE_OTHER): Payer: Medicare HMO | Admitting: Family Medicine

## 2022-10-30 VITALS — BP 112/80 | HR 114 | Temp 97.5°F | Resp 18 | Ht 66.0 in | Wt 197.6 lb

## 2022-10-30 DIAGNOSIS — M5441 Lumbago with sciatica, right side: Secondary | ICD-10-CM

## 2022-10-30 DIAGNOSIS — Z1159 Encounter for screening for other viral diseases: Secondary | ICD-10-CM | POA: Diagnosis not present

## 2022-10-30 DIAGNOSIS — E1169 Type 2 diabetes mellitus with other specified complication: Secondary | ICD-10-CM

## 2022-10-30 DIAGNOSIS — E538 Deficiency of other specified B group vitamins: Secondary | ICD-10-CM | POA: Diagnosis not present

## 2022-10-30 DIAGNOSIS — G4719 Other hypersomnia: Secondary | ICD-10-CM | POA: Diagnosis not present

## 2022-10-30 DIAGNOSIS — M5442 Lumbago with sciatica, left side: Secondary | ICD-10-CM

## 2022-10-30 DIAGNOSIS — G8929 Other chronic pain: Secondary | ICD-10-CM

## 2022-10-30 DIAGNOSIS — E1165 Type 2 diabetes mellitus with hyperglycemia: Secondary | ICD-10-CM

## 2022-10-30 DIAGNOSIS — Z6831 Body mass index (BMI) 31.0-31.9, adult: Secondary | ICD-10-CM | POA: Diagnosis not present

## 2022-10-30 DIAGNOSIS — G894 Chronic pain syndrome: Secondary | ICD-10-CM

## 2022-10-30 DIAGNOSIS — I10 Essential (primary) hypertension: Secondary | ICD-10-CM | POA: Diagnosis not present

## 2022-10-30 DIAGNOSIS — Z01419 Encounter for gynecological examination (general) (routine) without abnormal findings: Secondary | ICD-10-CM | POA: Diagnosis not present

## 2022-10-30 DIAGNOSIS — E785 Hyperlipidemia, unspecified: Secondary | ICD-10-CM

## 2022-10-30 DIAGNOSIS — M797 Fibromyalgia: Secondary | ICD-10-CM | POA: Diagnosis not present

## 2022-10-30 DIAGNOSIS — R5383 Other fatigue: Secondary | ICD-10-CM

## 2022-10-30 DIAGNOSIS — E1149 Type 2 diabetes mellitus with other diabetic neurological complication: Secondary | ICD-10-CM

## 2022-10-30 LAB — HM PAP SMEAR
HM Pap smear: NORMAL
HPV, high-risk: NEGATIVE

## 2022-10-30 NOTE — Assessment & Plan Note (Signed)
Encourage heart healthy diet such as MIND or DASH diet, increase exercise, avoid trans fats, simple carbohydrates and processed foods, consider a krill or fish or flaxseed oil cap daily.  °

## 2022-10-30 NOTE — Progress Notes (Signed)
Subjective:   By signing my name below, I, [name], attest that this documentation has been prepared under the direction and in the presence of [name], DO 10/30/22   Patient ID: Emily Maddox, female    DOB: 1971/04/03, 52 y.o.   MRN: [id number]  Chief Complaint  Patient presents with   Fatigue    Pt states having fatigue for some time but states getting worse. Pt thinks she may need to reduce work hours.     HPI Patient is in today for evaluation of fatigue.  She reports feeling exhausted for the last several months. Her fatigue has progressively worsened. She reports that she has not been sleeping well for a long time. She states that she has had to call out of work or go into work late due to the severity of her fatigue. She complains of accompanying diffuse aches and weakness. She does not snore and she had a negative sleep study. Patient states that her perimenopausal hot flashes sometimes wake her at night but she will also wake up during the night without the hot flashes. She has discussed her hot flashes with her OBGYN but she does not want to start any hormonal supplements due to her family history of cancer. She is scheduled to see her OBGYN later today and plans to discuss her options further.  She is not exercising. She is taking vitamin D and B12. 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   Diabetes mellitus    Hyperlipidemia    Tachycardia     Past Surgical History:  Procedure  Laterality Date   BREAST EXCISIONAL BIOPSY Left 2003    Family History  Problem Relation Age of Onset   Cancer Mother 21       breast   Hypertension Mother    Hyperlipidemia Mother    Diabetes Mother    Breast cancer Mother 57   Cancer Father 28       colon   Hypertension Father    Hyperlipidemia Father    Heart disease Father        a fib   Diabetes Father    Diabetes Sister    Breast cancer Other    Colon cancer Other     Social History   Socioeconomic History   Marital status: Single    Spouse name: Not on file   Number of children: Not on file   Years of education: Not on file   Highest education level: Bachelor's degree (e.g., BA, AB, BS)  Occupational History    Employer: UNEMPLOYED  Tobacco Use   Smoking status: Every Day    Packs/day: 1.00    Years: 19.00    Total pack years: 19.00    Types: Cigarettes   Smokeless tobacco: Never   Tobacco comments:    couldn't tolerate chantix, cut down to 0.5 pack since 11/2013  Vaping Use   Vaping Use: Never  used  Substance and Sexual Activity   Alcohol use: No   Drug use: No   Sexual activity: Yes    Partners: Female  Other Topics Concern   Not on file  Social History Narrative   Lives alone.     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 in business administration   Exercise--  3-4 x a week   R handed   Caffeine: 3 c of coffee a day   Social Determinants of Health   Financial Resource Strain: Low Risk  (03/31/2021)   Overall Financial Resource Strain (CARDIA)    Difficulty of Paying Living Expenses: Not hard at all  Food Insecurity: No Food Insecurity (03/31/2021)   Hunger Vital Sign    Worried About Running Out of Food in the Last Year: Never true    Ran Out of Food in the Last Year: Never true  Transportation Needs: No Transportation Needs (03/31/2021)   PRAPARE - Hydrologist (Medical): No    Lack of Transportation (Non-Medical): No  Physical Activity: Sufficiently Active (03/31/2021)    Exercise Vital Sign    Days of Exercise per Week: 3 days    Minutes of Exercise per Session: 50 min  Stress: Stress Concern Present (03/31/2021)   [place]    Feeling of Stress : To some extent  Social Connections: Moderately Isolated (03/31/2021)   Social Connection and Isolation Panel [NHANES]    Frequency of Communication with Friends and Family: More than three times a week    Frequency of Social Gatherings with Friends and Family: More than three times a week    Attends Religious Services: More than 4 times per year    Active Member of [employer] or Organizations: Not on file    Attends Archivist Meetings: Never    Marital Status: Never married  Intimate Partner Violence: Not At Risk (03/31/2021)   Humiliation, Afraid, Rape, and Kick questionnaire    Fear of Current or Ex-Partner: No    Emotionally Abused: No    Physically Abused: No    Sexually Abused: No    Outpatient Medications Prior to Visit  Medication Sig Dispense Refill   Blood Glucose Monitoring Suppl ([place]) w/Device KIT 1 each by Does not apply route daily. Dx code: E11.9 1 kit 0   glucose blood (ONETOUCH VERIO) test strip USE 1 STRIP TO CHECK GLUCOSE ONCE DAILY 100 each 12   HYDROcodone-acetaminophen (NORCO) 10-325 MG tablet Take 1 tablet by mouth every 6 (six) hours as needed. 60 tablet 0   MOUNJARO 2.5 MG/0.5ML Pen INJECT 2.5 MG SUBCUTANEOUSLY  ONCE A WEEK 4 mL 1   Multiple Vitamin (MULTIVITAMIN) tablet Take 1 tablet by mouth daily.     ONETOUCH DELICA LANCETS 40N MISC Check sugar every day.  Dx Code: E11.9 100 each 1   Probiotic Product (PROBIOTIC DAILY PO) Take 1 capsule by mouth daily.     rosuvastatin (CRESTOR) 40 MG tablet Take 1 tablet (40 mg total) by mouth daily. 90 tablet 0   VITAMIN D PO Take 2,000 Units by mouth daily.     Vitamin D, Ergocalciferol, (DRISDOL) 1.25 MG (50000 UNIT) CAPS capsule Take 1 capsule (50,000  Units total) by mouth every 7 (seven) days. 12 capsule 1   No facility-administered medications prior to visit.    Allergies  Allergen Reactions   Metoprolol Other (See Comments)    exhaustion   Iodinated Contrast  Media    Latex    Shellfish Allergy    Ultram [Tramadol] Palpitations    Review of Systems  Constitutional:  Positive for malaise/fatigue. Negative for fever.  HENT:  Negative for congestion.   Eyes:  Negative for blurred vision.  Respiratory:  Negative for shortness of breath.   Cardiovascular:  Negative for chest pain, palpitations and leg swelling.  Gastrointestinal:  Negative for abdominal pain, blood in stool and nausea.  Genitourinary:  Negative for dysuria and frequency.  Musculoskeletal:  Positive for joint pain and myalgias. Negative for falls.  Skin:  Negative for rash.  Neurological:  Positive for weakness. Negative for dizziness, loss of consciousness and headaches.  Endo/Heme/Allergies:  Negative for environmental allergies.       + Hot flashes  Psychiatric/Behavioral:  Negative for depression. The patient has insomnia. The patient is not nervous/anxious.        Objective:    Physical Exam Vitals and nursing note reviewed.  Constitutional:      General: She is not in acute distress.    Appearance: She is well-developed.  HENT:     Head: Normocephalic and atraumatic.     Nose: Nose normal.  Eyes:     General:        Right eye: No discharge.        Left eye: No discharge.  Cardiovascular:     Rate and Rhythm: Normal rate and regular rhythm.     Heart sounds: No murmur heard. Pulmonary:     Effort: Pulmonary effort is normal.     Breath sounds: Normal breath sounds.  Abdominal:     General: Bowel sounds are normal.     Palpations: Abdomen is soft.     Tenderness: There is no abdominal tenderness.  Musculoskeletal:     Cervical back: Normal range of motion and neck supple.  Skin:    General: Skin is warm and dry.  Neurological:      Mental Status: She is alert and oriented to person, place, and time.     BP 112/80 (BP Location: Left Arm, Patient Position: Sitting, Cuff Size: Normal)   Pulse (!) 114   Temp (!) 97.5 F (36.4 C) (Oral)   Resp 18   Ht '5\' 6"'$  (1.676 m)   Wt 197 lb 9.6 oz (89.6 kg)   SpO2 99%   BMI 31.89 kg/m  Wt Readings from Last 3 Encounters:  10/30/22 197 lb 9.6 oz (89.6 kg)  10/20/22 196 lb (88.9 kg)  08/08/22 195 lb 3.2 oz (88.5 kg)       Assessment & Plan:  Chronic bilateral low back pain with bilateral sciatica -     VITAMIN D 25 Hydroxy (Vit-D Deficiency, Fractures) -     Vitamin B12 -     Hemoglobin A1c -     Comprehensive metabolic panel -     Lipid panel -     TSH  B12 deficiency Assessment & Plan: Recheck labs   Orders: -     Vitamin B12  Hyperlipidemia, unspecified hyperlipidemia type -     VITAMIN D 25 Hydroxy (Vit-D Deficiency, Fractures) -     Vitamin B12 -     Hemoglobin A1c -     Comprehensive metabolic panel -     Lipid panel -     TSH  Essential hypertension Assessment & Plan: Well controlled, no changes to meds. Encouraged heart healthy diet such as the DASH diet and exercise as tolerated.  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, without long-term current use of insulin (HCC) -     VITAMIN D 25 Hydroxy (Vit-D Deficiency, Fractures) -     Vitamin B12 -     Hemoglobin A1c -     Comprehensive metabolic panel -     Lipid panel -     TSH  Other fatigue -     VITAMIN D 25 Hydroxy (Vit-D Deficiency, Fractures) -     Vitamin B12 -     Hemoglobin A1c -     Comprehensive metabolic panel -     Lipid panel -     TSH  Fibromyalgia Assessment & Plan: Pt to start PT soon  Orders: -     VITAMIN D 25 Hydroxy (Vit-D Deficiency, Fractures) -     Vitamin  B12 -     Hemoglobin A1c -     Comprehensive metabolic panel -     Lipid panel -     TSH  Need for hepatitis C screening test -     Hepatitis C antibody  Chronic pain syndrome Assessment & Plan: Con't pain management    Excessive daytime sleepiness Assessment & Plan: Pt is not sleeping well Sleep study showed no osa Hot flashes wake her up--  she sill see her gyn today to discuss further Pt is starting PT in pool as well  She will contact us if getting more sleep does not help her fatigue    Hyperlipidemia associated with type 2 diabetes mellitus ([place]) Assessment & Plan: Encourage heart healthy diet such as MIND or DASH diet, increase exercise, avoid trans fats, simple carbohydrates and processed foods, consider a krill or fish or flaxseed oil cap daily.     Uncontrolled type 2 diabetes mellitus with hyperglycemia (HCC) Assessment & Plan: hgba1c to be checked , minimize simple carbs. Increase exercise as tolerated. Continue current meds       I,Alexis Herring,acting as a scribe for [name], DO.,have documented all relevant documentation on the behalf of [name], DO,as directed by  [name], DO while in the presence of [name], DO.   I, [name], DO, personally preformed the services described in this documentation.  All medical record entries made by the scribe were at my direction and in my presence.  I have reviewed the chart and discharge instructions (if applicable) and agree that the record reflects my personal performance and is accurate and complete. 10/30/22   [name], DO

## 2022-10-30 NOTE — Patient Instructions (Signed)
Menopause Menopause is the normal time of a woman's life when menstrual periods stop completely. It marks the natural end to a woman's ability to become pregnant. It can be defined as the absence of a menstrual period for 12 months without another medical cause. The transition to menopause (perimenopause) most often happens between the ages of 45 and 55, and can last for many years. During perimenopause, hormone levels change in your body, which can cause symptoms and affect your health. Menopause may increase your risk for: Weakened bones (osteoporosis), which causes fractures. Depression. Hardening and narrowing of the arteries (atherosclerosis), which can cause heart attacks and strokes. What are the causes? This condition is usually caused by a natural change in hormone levels that happens as you get older. The condition may also be caused by changes that are not natural, including: Surgery to remove both ovaries (surgical menopause). Side effects from some medicines, such as chemotherapy used to treat cancer (chemical menopause). What increases the risk? This condition is more likely to start at an earlier age if you have certain medical conditions or have undergone treatments, including: A tumor of the pituitary gland in the brain. A disease that affects the ovaries and hormones. Certain cancer treatments, such as chemotherapy or hormone therapy, or radiation therapy on the pelvis. Heavy smoking and excessive alcohol use. Family history of early menopause. This condition is also more likely to develop earlier in women who are very thin. What are the signs or symptoms? Symptoms of this condition include: Hot flashes. Irregular menstrual periods. Night sweats. Changes in feelings about sex. This could be a decrease in sex drive or an increased discomfort around your sexuality. Vaginal dryness and thinning of the vaginal walls. This may cause painful sex. Dryness of the skin and  development of wrinkles. Headaches. Problems sleeping (insomnia). Mood swings or irritability. Memory problems. Weight gain. Hair growth on the face and chest. Bladder infections or problems with urinating. How is this diagnosed? This condition is diagnosed based on your medical history, a physical exam, your age, your menstrual history, and your symptoms. Hormone tests may also be done. How is this treated? In some cases, no treatment is needed. You and your health care provider should make a decision together about whether treatment is necessary. Treatment will be based on your individual condition and preferences. Treatment for this condition focuses on managing symptoms. Treatment may include: Menopausal hormone therapy (MHT). Medicines to treat specific symptoms or complications. Acupuncture. Vitamin or herbal supplements. Before starting treatment, make sure to let your health care provider know if you have a personal or family history of these conditions: Heart disease. Breast cancer. Blood clots. Diabetes. Osteoporosis. Follow these instructions at home: Lifestyle Do not use any products that contain nicotine or tobacco, such as cigarettes, e-cigarettes, and chewing tobacco. If you need help quitting, ask your health care provider. Get at least 30 minutes of physical activity on 5 or more days each week. Avoid alcoholic and caffeinated beverages, as well as spicy foods. This may help prevent hot flashes. Get 7-8 hours of sleep each night. If you have hot flashes, try: Dressing in layers. Avoiding things that may trigger hot flashes, such as spicy food, warm places, or stress. Taking slow, deep breaths when a hot flash starts. Keeping a fan in your home and office. Find ways to manage stress, such as deep breathing, meditation, or journaling. Consider going to group therapy with other women who are having menopause symptoms. Ask your health care   provider about recommended  group therapy meetings. Eating and drinking  Eat a healthy, balanced diet that contains whole grains, lean protein, low-fat dairy, and plenty of fruits and vegetables. Your health care provider may recommend adding more soy to your diet. Foods that contain soy include tofu, tempeh, and soy milk. Eat plenty of foods that contain calcium and vitamin D for bone health. Items that are rich in calcium include low-fat milk, yogurt, beans, almonds, sardines, broccoli, and kale. Medicines Take over-the-counter and prescription medicines only as told by your health care provider. Talk with your health care provider before starting any herbal supplements. If prescribed, take vitamins and supplements as told by your health care provider. General instructions  Keep track of your menstrual periods, including: When they occur. How heavy they are and how long they last. How much time passes between periods. Keep track of your symptoms, noting when they start, how often you have them, and how long they last. Use vaginal lubricants or moisturizers to help with vaginal dryness and improve comfort during sex. Keep all follow-up visits. This is important. This includes any group therapy or counseling. Contact a health care provider if: You are still having menstrual periods after age 55. You have pain during sex. You have not had a period for 12 months and you develop vaginal bleeding. Get help right away if you have: Severe depression. Excessive vaginal bleeding. Pain when you urinate. A fast or irregular heartbeat (palpitations). Severe headaches. Abdominal pain or severe indigestion. Summary Menopause is a normal time of life when menstrual periods stop completely. It is usually defined as the absence of a menstrual period for 12 months without another medical cause. The transition to menopause (perimenopause) most often happens between the ages of 45 and 55 and can last for several years. Symptoms  can be managed through medicines, lifestyle changes, and complementary therapies such as acupuncture. Eat a balanced diet that is rich in nutrients to promote bone health and heart health and to manage symptoms during menopause. This information is not intended to replace advice given to you by your health care provider. Make sure you discuss any questions you have with your health care provider. Document Revised: 07/09/2020 Document Reviewed: 03/25/2020 Elsevier Patient Education  2023 [employer] Inc[employer]  

## 2022-10-30 NOTE — Assessment & Plan Note (Signed)
Pt is not sleeping well Sleep study showed no osa Hot flashes wake her up--  she sill see her gyn today to discuss further Pt is starting PT in pool as well  She will contact us if getting more sleep does not help her fatigue

## 2022-10-30 NOTE — Assessment & Plan Note (Signed)
Recheck labs 

## 2022-10-30 NOTE — Assessment & Plan Note (Signed)
Pt to start PT soon

## 2022-10-30 NOTE — Addendum Note (Signed)
Addended by: Manuela Schwartz on: 10/30/2022 12:27 PM   Modules accepted: Orders

## 2022-10-30 NOTE — Assessment & Plan Note (Signed)
Con't pain management

## 2022-10-30 NOTE — Assessment & Plan Note (Signed)
hgba1c to be checked, minimize simple carbs. Increase exercise as tolerated. Continue current meds  

## 2022-10-30 NOTE — Assessment & Plan Note (Signed)
Well controlled, no changes to meds. Encouraged heart healthy diet such as the DASH diet and exercise as tolerated.  °

## 2022-10-31 ENCOUNTER — Other Ambulatory Visit: Payer: Self-pay | Admitting: Family Medicine

## 2022-10-31 ENCOUNTER — Other Ambulatory Visit (HOSPITAL_BASED_OUTPATIENT_CLINIC_OR_DEPARTMENT_OTHER): Payer: Self-pay

## 2022-10-31 ENCOUNTER — Telehealth: Payer: Self-pay | Admitting: Family Medicine

## 2022-10-31 MED ORDER — HYDROCODONE-ACETAMINOPHEN 10-325 MG PO TABS
1.0000 | ORAL_TABLET | Freq: Four times a day (QID) | ORAL | 0 refills | Status: DC | PRN
  Filled 2022-10-31 (×2): qty 60, 15d supply, fill #0
  Filled ????-??-??: fill #0

## 2022-10-31 NOTE — Telephone Encounter (Signed)
Pt states [facility] is out of HYDROcodone-acetaminophen (NORCO) 10-325 MG tablet and she would like to know what to do. Advised pt to see if there is another pharmacy that may have it in stock. She stated she may call back.

## 2022-10-31 NOTE — Telephone Encounter (Signed)
Pt called back stating that the [facility] does have this medication in stock and to route it there when possible. Please Advise.

## 2022-11-03 ENCOUNTER — Ambulatory Visit
Admission: RE | Admit: 2022-11-03 | Discharge: 2022-11-03 | Disposition: A | Discharge status: Home / Self Care | Payer: Medicare HMO | Source: Ambulatory Visit | Attending: Family Medicine | Admitting: Family Medicine

## 2022-11-03 DIAGNOSIS — Z1231 Encounter for screening mammogram for malignant neoplasm of breast: Secondary | ICD-10-CM

## 2022-11-07 ENCOUNTER — Other Ambulatory Visit: Payer: Medicare HMO

## 2022-11-20 ENCOUNTER — Encounter: Payer: Self-pay | Admitting: Family Medicine

## 2022-11-20 ENCOUNTER — Ambulatory Visit (INDEPENDENT_AMBULATORY_CARE_PROVIDER_SITE_OTHER): Payer: Medicare HMO | Admitting: Family Medicine

## 2022-11-20 VITALS — BP 120/80 | HR 86 | Temp 98.1°F | Resp 18 | Ht 66.0 in | Wt 198.6 lb

## 2022-11-20 DIAGNOSIS — E1169 Type 2 diabetes mellitus with other specified complication: Secondary | ICD-10-CM | POA: Diagnosis not present

## 2022-11-20 DIAGNOSIS — Z0000 Encounter for general adult medical examination without abnormal findings: Secondary | ICD-10-CM

## 2022-11-20 DIAGNOSIS — I10 Essential (primary) hypertension: Secondary | ICD-10-CM

## 2022-11-20 DIAGNOSIS — E1165 Type 2 diabetes mellitus with hyperglycemia: Secondary | ICD-10-CM | POA: Diagnosis not present

## 2022-11-20 DIAGNOSIS — Z1159 Encounter for screening for other viral diseases: Secondary | ICD-10-CM | POA: Diagnosis not present

## 2022-11-20 DIAGNOSIS — E785 Hyperlipidemia, unspecified: Secondary | ICD-10-CM

## 2022-11-20 DIAGNOSIS — R232 Flushing: Secondary | ICD-10-CM | POA: Diagnosis not present

## 2022-11-20 DIAGNOSIS — E1149 Type 2 diabetes mellitus with other diabetic neurological complication: Secondary | ICD-10-CM

## 2022-11-20 DIAGNOSIS — E538 Deficiency of other specified B group vitamins: Secondary | ICD-10-CM | POA: Diagnosis not present

## 2022-11-20 DIAGNOSIS — M797 Fibromyalgia: Secondary | ICD-10-CM

## 2022-11-20 DIAGNOSIS — G894 Chronic pain syndrome: Secondary | ICD-10-CM | POA: Diagnosis not present

## 2022-11-20 LAB — COMPREHENSIVE METABOLIC PANEL
ALT: 8 U/L (ref 0–35)
AST: 14 U/L (ref 0–37)
Albumin: 4.1 g/dL (ref 3.5–5.2)
Alkaline Phosphatase: 59 U/L (ref 39–117)
BUN: 11 mg/dL (ref 6–23)
CO2: 26 mEq/L (ref 19–32)
Calcium: 9.2 mg/dL (ref 8.4–10.5)
Chloride: 105 mEq/L (ref 96–112)
Creatinine, Ser: 0.94 mg/dL (ref 0.40–1.20)
GFR: 70.88 mL/min (ref >60.00)
Glucose, Bld: 91 mg/dL (ref 70–99)
Potassium: 4.8 mEq/L (ref 3.5–5.1)
Sodium: 140 mEq/L (ref 135–145)
Total Bilirubin: 0.3 mg/dL (ref 0.2–1.2)
Total Protein: 6.9 g/dL (ref 6.0–8.3)

## 2022-11-20 LAB — MICROALBUMIN / CREATININE URINE RATIO
Creatinine,U: 60.8 mg/dL
Microalb Creat Ratio: 1.2 mg/g (ref 0.0–30.0)
Microalb, Ur: <0.7 mg/dL (ref 0.0–1.9)

## 2022-11-20 LAB — VITAMIN D 25 HYDROXY (VIT D DEFICIENCY, FRACTURES): VITD: 30.28 ng/mL (ref 30.00–100.00)

## 2022-11-20 LAB — CBC WITH DIFFERENTIAL/PLATELET
Basophils Absolute: 0.1 10*3/uL (ref 0.0–0.1)
Basophils Relative: 0.8 % (ref 0.0–3.0)
Eosinophils Absolute: 0.1 10*3/uL (ref 0.0–0.7)
Eosinophils Relative: 1.3 % (ref 0.0–5.0)
HCT: 41.4 % (ref 36.0–46.0)
Hemoglobin: 14 g/dL (ref 12.0–15.0)
Lymphocytes Relative: 24 % (ref 12.0–46.0)
Lymphs Abs: 1.9 10*3/uL (ref 0.7–4.0)
MCHC: 33.8 g/dL (ref 30.0–36.0)
MCV: 99.2 fl (ref 78.0–100.0)
Monocytes Absolute: 0.4 10*3/uL (ref 0.1–1.0)
Monocytes Relative: 5.3 % (ref 3.0–12.0)
Neutro Abs: 5.5 10*3/uL (ref 1.4–7.7)
Neutrophils Relative %: 68.6 % (ref 43.0–77.0)
Platelets: 312 10*3/uL (ref 150.0–400.0)
RBC: 4.17 Mil/uL (ref 3.87–5.11)
RDW: 13.8 % (ref 11.5–15.5)
WBC: 8 10*3/uL (ref 4.0–10.5)

## 2022-11-20 LAB — LIPID PANEL
Cholesterol: 181 mg/dL (ref 0–200)
HDL: 57.8 mg/dL (ref >39.00)
LDL Cholesterol: 106 mg/dL — ABNORMAL HIGH (ref 0–99)
NonHDL: 123.46
Total CHOL/HDL Ratio: 3
Triglycerides: 89 mg/dL (ref 0.0–149.0)
VLDL: 17.8 mg/dL (ref 0.0–40.0)

## 2022-11-20 LAB — VITAMIN B12: Vitamin B-12: 346 pg/mL (ref 211–911)

## 2022-11-20 LAB — HEMOGLOBIN A1C: Hgb A1c MFr Bld: 6.1 % (ref 4.6–6.5)

## 2022-11-20 MED ORDER — ROSUVASTATIN CALCIUM 40 MG PO TABS: 40.0000 mg | ORAL_TABLET | Freq: Every day | ORAL | 1 refills | Status: DC

## 2022-11-20 NOTE — Assessment & Plan Note (Signed)
Database reviewed  Contract and uds utd

## 2022-11-20 NOTE — Assessment & Plan Note (Signed)
Well controlled, no changes to meds. Encouraged heart healthy diet such as the DASH diet and exercise as tolerated.  °

## 2022-11-20 NOTE — Assessment & Plan Note (Signed)
Check labs 

## 2022-11-20 NOTE — Progress Notes (Signed)
Subjective:   By signing my name below, I, Emily Maddox, attest that this documentation has been prepared under the direction and in the presence of Emily Held, DO 11/20/22   Patient ID: Emily Maddox, female    DOB: 06-03-1971, 52 y.o.   MRN: [id number]  Chief Complaint  Patient presents with   Annual Exam    Pt states fasting     HPI Patient is in today for CPE.  Her last mammogram was done on 11/03/22 and was negative for malignancy. Her last colonoscopy was done on 01/03/2016 revealing 3 polyps. Biopsy was negative for malignancy. 5 year recall was recommended at that time. She states that she needs to call and schedule a repeat colonoscopy. She has established care with GI Dr. Juanita Maddox. Her last pap smear was done on 10/30/2022. She is UTD on eye and dental exams. Patient is due for Shingrix vaccine but is otherwise UTD on immunizations.  She reports intermittent lower abdominal pain. She states that she does have a Hx of fibroids which were again palpated on her recent OB exam. She notes intermittent constipation but she is passing bowel movements.  She states that she rarely checks her blood sugar at home but when she does check it it is normal. She has not yet started the [facility]. Lab Results  Component Value Date   HGBA1C 6.2 05/19/2022   She reports that she was recently started on Veozah with relief of her hot flashes. However, she started her menstrual period again after starting the medication. She is followed by OB Dr. Linda Maddox.  She continues to have intermittent upper back pain. Patient states that she has not taken anything for her pain yet today. She does have a prescription for Norco 10. She denies any known injuries. She is followed by physiatrist Dr. Alysia Penna for DDD and chronic upper back pain.  She has not been exercising due to fatigue by the time she gets home from work. She reports associated weight gain. Wt Readings from Last 5  Encounters:  11/20/22 198 lb 9.6 oz (90.1 kg)  10/30/22 197 lb 9.6 oz (89.6 kg)  10/20/22 196 lb (88.9 kg)  08/08/22 195 lb 3.2 oz (88.5 kg)  05/19/22 195 lb 12.8 oz (88.8 kg)    She has been compliant with Crestor '40mg'$  daily. She requests a refill of this today. 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   Diabetes mellitus    Hyperlipidemia    Tachycardia     Past Surgical History:  Procedure Laterality Date   BREAST EXCISIONAL BIOPSY Left 2003    Family History  Problem Relation Age of Onset   Cancer Mother 95       breast   Hypertension Mother    Hyperlipidemia Mother    Diabetes Mother    Breast cancer Mother 89   Cancer Father 64       colon   Hypertension Father    Hyperlipidemia Father    Heart disease Father        a fib   Diabetes Father    Diabetes Sister    Breast cancer Other  Colon cancer Other     Social History   Socioeconomic History   Marital status: Single    Spouse name: Not on file   Number of children: Not on file   Years of education: Not on file   Highest education level: Bachelor's degree (e.g., BA, AB, BS)  Occupational History    Employer: [employer]   Occupation: [employer]  Tobacco Use   Smoking status: Every Day    Packs/day: 1.00    Years: 19.00    Total pack years: 19.00    Types: Cigarettes   Smokeless tobacco: Never   Tobacco comments:    couldn't tolerate chantix, cut down to 0.5 pack since 11/2013  Vaping Use   Vaping Use: Never used  Substance and Sexual Activity   Alcohol use: No   Drug use: No   Sexual activity: Yes    Partners: Female  Other Topics Concern   Not on file  Social History Narrative   Lives alone.     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  in business administration   Exercise--no     R handed   Caffeine: 3 c of coffee a day   Social Determinants of Health   Financial Resource Strain: Low Risk  (03/31/2021)   Overall Financial Resource Strain (CARDIA)    Difficulty of Paying Living Expenses: Not hard at all  Food Insecurity: No Food Insecurity (03/31/2021)   Hunger Vital Sign    Worried About Running Out of Food in the Last Year: Never true    Ran Out of Food in the Last Year: Never true  Transportation Needs: No Transportation Needs (03/31/2021)   PRAPARE - Hydrologist (Medical): No    Lack of Transportation (Non-Medical): No  Physical Activity: Sufficiently Active (03/31/2021)   Exercise Vital Sign    Days of Exercise per Week: 3 days    Minutes of Exercise per Session: 50 min  Stress: Stress Concern Present (03/31/2021)   [place]    Feeling of Stress : To some extent  Social Connections: Moderately Isolated (03/31/2021)   Social Connection and Isolation Panel [NHANES]    Frequency of Communication with Friends and Family: More than three times a week    Frequency of Social Gatherings with Friends and Family: More than three times a week    Attends Religious Services: More than 4 times per year    Active Member of [employer] or Organizations: Not on file    Attends Archivist Meetings: Never    Marital Status: Never married  Intimate Partner Violence: Not At Risk (03/31/2021)   Humiliation, Afraid, Rape, and Kick questionnaire    Fear of Current or Ex-Partner: No    Emotionally Abused: No    Physically Abused: No    Sexually Abused: No    Outpatient Medications Prior to Visit  Medication Sig Dispense Refill   Blood Glucose Monitoring Suppl ([place]) w/Device KIT 1 each by Does not apply route daily. Dx code: E11.9 1 kit 0   Fezolinetant (VEOZAH) 45 MG TABS Take 1 tablet (45 mg total) by mouth daily.  30 tablet    glucose blood (ONETOUCH VERIO) test strip USE 1 STRIP TO CHECK GLUCOSE ONCE DAILY 100 each 12   HYDROcodone-acetaminophen (NORCO) 10-325 MG tablet Take 1 tablet by mouth every 6 (six) hours as needed. 60 tablet 0   MOUNJARO 2.5 MG/0.5ML Pen INJECT 2.5 MG SUBCUTANEOUSLY  ONCE A WEEK 4 mL 1   Multiple Vitamin (MULTIVITAMIN) tablet Take 1 tablet by mouth daily.     ONETOUCH DELICA LANCETS 78E MISC Check sugar every day.  Dx Code: E11.9 100 each 1   Probiotic Product (PROBIOTIC DAILY PO) Take 1 capsule by mouth daily.     VITAMIN D PO Take 2,000 Units by mouth daily.     Vitamin D, Ergocalciferol, (DRISDOL) 1.25 MG (50000 UNIT) CAPS capsule Take 1 capsule (50,000 Units total) by mouth every 7 (seven) days. 12 capsule 1   rosuvastatin (CRESTOR) 40 MG tablet Take 1 tablet (40 mg total) by mouth daily. 90 tablet 0   No facility-administered medications prior to visit.    Allergies  Allergen Reactions   Metoprolol Other (See Comments)    exhaustion   Iodinated Contrast Media    Latex    Shellfish Allergy    Ultram [Tramadol] Palpitations    Review of Systems  Constitutional:  Negative for fever.  HENT:  Negative for congestion, sinus pain and sore throat.   Respiratory:  Negative for cough, shortness of breath and wheezing.   Cardiovascular:  Negative for chest pain and palpitations.  Gastrointestinal:  Positive for abdominal pain and constipation. Negative for diarrhea, nausea and vomiting.  Genitourinary:  Negative for dysuria, frequency and hematuria.  Musculoskeletal:  Positive for back pain and myalgias. Negative for joint pain.  Skin:        (-)New moles  Neurological:  Negative for headaches.       Objective:    Physical Exam Constitutional:      General: She is not in acute distress.    Appearance: Normal appearance. She is not ill-appearing.  HENT:     Head: Normocephalic and atraumatic.     Right Ear: Tympanic membrane, ear canal and external ear normal.      Left Ear: Tympanic membrane, ear canal and external ear normal.  Eyes:     Extraocular Movements: Extraocular movements intact.     Pupils: Pupils are equal, round, and reactive to light.  Cardiovascular:     Rate and Rhythm: Normal rate and regular rhythm.     Heart sounds: Normal heart sounds. No murmur heard.    No gallop.  Pulmonary:     Effort: Pulmonary effort is normal. No respiratory distress.     Breath sounds: Normal breath sounds. No wheezing or rales.  Abdominal:     General: Bowel sounds are normal. There is no distension.     Palpations: Abdomen is soft.     Tenderness: There is no abdominal tenderness. There is no guarding.  Skin:    General: Skin is warm and dry.  Neurological:     Mental Status: She is alert and oriented to person, place, and time.  Psychiatric:        Judgment: Judgment normal.     BP 120/80 (BP Location: Left Arm, Patient Position: Sitting, Cuff Size: Large)   Pulse 86   Temp 98.1 F (36.7 C) (Oral)   Resp 18   Ht '5\' 6"'$  (1.676 m)   Wt 198 lb 9.6 oz (90.1 kg)   SpO2 99%   BMI 32.05 kg/m  Wt Readings from Last 3 Encounters:  11/20/22 198 lb 9.6 oz (90.1 kg)  10/30/22 197 lb 9.6 oz (89.6 kg)  10/20/22 196 lb (88.9 kg)       Assessment & Plan:  Preventative health care -     Lipid panel -  CBC with Differential/Platelet -     Comprehensive metabolic panel -     Hemoglobin A1c -     Microalbumin / creatinine urine ratio  Hot flashes -     Vitamin B12 -     VITAMIN D 25 Hydroxy (Vit-D Deficiency, Fractures)  B12 deficiency Assessment & Plan: Check labs   Orders: -     Vitamin B12 -     VITAMIN D 25 Hydroxy (Vit-D Deficiency, Fractures)  Essential hypertension Assessment & Plan: Well controlled, no changes to meds. Encouraged heart healthy diet such as the DASH diet and exercise as tolerated.    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, without long-term current use of insulin (HCC) -     Lipid panel -     CBC with Differential/Platelet -     Comprehensive metabolic panel -     Hemoglobin A1c -     Microalbumin / creatinine urine ratio  Fibromyalgia -     Lipid panel -     CBC with Differential/Platelet -     Comprehensive metabolic panel -     Hemoglobin A1c -     Microalbumin / creatinine urine ratio  Hyperlipidemia associated with type 2 diabetes mellitus (HCC) -     Lipid panel -     CBC with Differential/Platelet -     Comprehensive metabolic panel -     Hemoglobin A1c -     Microalbumin / creatinine urine ratio -     Rosuvastatin Calcium; Take 1 tablet (40 mg total) by mouth daily.  Dispense: 90 tablet; Refill: 1  Uncontrolled type 2 diabetes mellitus with hyperglycemia (HCC) -     Lipid panel -     CBC with Differential/Platelet -     Comprehensive metabolic panel -     Hemoglobin A1c -     Microalbumin / creatinine urine ratio  Need for hepatitis C screening test -     Hepatitis C antibody  Chronic pain syndrome Assessment & Plan: Database reviewed  Contract and uds utd       I,Alexis Herring,acting as a scribe for [employer], DO.,have documented all relevant documentation on the behalf of Emily Held, DO,as directed by  Emily Held, DO while in the presence of [place], DO, personally preformed the services described in this documentation.  All medical record entries made by the scribe were at my direction and in my presence.  I have reviewed the chart and discharge instructions (if applicable) and agree that the record reflects my personal performance and is accurate and complete. 11/20/22   Emily Held, DO

## 2022-11-20 NOTE — Patient Instructions (Signed)

## 2022-11-21 LAB — HEPATITIS C ANTIBODY: Hepatitis C Ab: NONREACTIVE

## 2022-11-28 ENCOUNTER — Other Ambulatory Visit (HOSPITAL_BASED_OUTPATIENT_CLINIC_OR_DEPARTMENT_OTHER): Payer: Self-pay

## 2022-11-28 ENCOUNTER — Other Ambulatory Visit: Payer: Self-pay | Admitting: Family Medicine

## 2022-11-28 ENCOUNTER — Encounter: Payer: Medicare HMO | Admitting: Family Medicine

## 2022-11-28 MED ORDER — HYDROCODONE-ACETAMINOPHEN 10-325 MG PO TABS
1.0000 | ORAL_TABLET | Freq: Four times a day (QID) | ORAL | 0 refills | Status: DC | PRN
  Filled 2022-11-28: qty 60, 15d supply, fill #0

## 2022-11-28 NOTE — Telephone Encounter (Signed)
Medication: HYDROcodone-acetaminophen (NORCO) 10-325 MG tablet   Has the patient contacted their pharmacy? No.   Preferred Pharmacy:    [facility] [address] [place], [place] [address] Phone: [phone]  Fax: [phone]

## 2022-11-28 NOTE — Telephone Encounter (Signed)
Requesting: NORCO Contract: 01/02/2022 UDS: 01/02/2022 Last OV: 11/20/2022 Next OV: N/A Last Refill: 10/31/2022, #60--0 RF Database:   Please advise

## 2022-12-05 ENCOUNTER — Encounter
Payer: Federal, State, Local not specified - PPO | Attending: Physical Medicine & Rehabilitation | Admitting: Physical Medicine & Rehabilitation

## 2022-12-05 ENCOUNTER — Encounter: Payer: Self-pay | Admitting: Physical Medicine & Rehabilitation

## 2022-12-05 VITALS — BP 115/83 | HR 86 | Temp 97.6°F | Ht 66.0 in | Wt 200.0 lb

## 2022-12-05 DIAGNOSIS — M25532 Pain in left wrist: Secondary | ICD-10-CM | POA: Insufficient documentation

## 2022-12-05 NOTE — Patient Instructions (Signed)
Will schedule for wrist tendinitis injection

## 2022-12-05 NOTE — Progress Notes (Signed)
EMG/NCV completed to evaluate LUE wrist and finger pain.  No EDX evidence of carpal tunnel syndrome.  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  Please refer to report scanned under media tab for details,

## 2022-12-11 ENCOUNTER — Ambulatory Visit: Payer: Medicare HMO

## 2022-12-25 ENCOUNTER — Other Ambulatory Visit: Payer: Self-pay | Admitting: Family Medicine

## 2022-12-25 ENCOUNTER — Ambulatory Visit (HOSPITAL_BASED_OUTPATIENT_CLINIC_OR_DEPARTMENT_OTHER)
Payer: Federal, State, Local not specified - PPO | Attending: Physical Medicine & Rehabilitation | Admitting: Physical Therapy

## 2022-12-25 ENCOUNTER — Other Ambulatory Visit: Payer: Self-pay

## 2022-12-25 ENCOUNTER — Encounter (HOSPITAL_BASED_OUTPATIENT_CLINIC_OR_DEPARTMENT_OTHER): Payer: Self-pay | Admitting: Physical Therapy

## 2022-12-25 DIAGNOSIS — M5136 Other intervertebral disc degeneration, lumbar region: Secondary | ICD-10-CM | POA: Diagnosis not present

## 2022-12-25 DIAGNOSIS — M5459 Other low back pain: Secondary | ICD-10-CM | POA: Insufficient documentation

## 2022-12-25 MED ORDER — HYDROCODONE-ACETAMINOPHEN 10-325 MG PO TABS: 1.0000 | ORAL_TABLET | Freq: Four times a day (QID) | ORAL | 0 refills | Status: DC | PRN

## 2022-12-25 NOTE — Telephone Encounter (Signed)
Prescription Request  12/25/2022  Is this a "Controlled Substance" medicine? Yes  LOV: 11/20/2022  What is the name of the medication or equipment?  HYDROcodone-acetaminophen (NORCO) 10-325 MG tablet   Have you contacted your pharmacy to request a refill? No   Which pharmacy would you like this sent to?   [facility] [address] [place] [address] Phone: [phone] Fax: [phone]    Patient notified that their request is being sent to the clinical staff for review and that they should receive a response within 2 business days.   Please advise at Mobile [phone] (mobile)
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
## 2022-12-25 NOTE — Telephone Encounter (Signed)
Requesting: NORCO Contract: 01/02/2022 UDS: 01/02/2022 Last OV: 11/20/22 Next OV: N/A Last Refill: 11/28/2022, #60--0 RF Database:   Please advise

## 2023-01-11 ENCOUNTER — Encounter
Payer: Federal, State, Local not specified - PPO | Attending: Physical Medicine & Rehabilitation | Admitting: Physical Medicine & Rehabilitation

## 2023-01-11 ENCOUNTER — Encounter: Payer: Self-pay | Admitting: Physical Medicine & Rehabilitation

## 2023-01-11 VITALS — BP 132/84 | HR 90 | Temp 98.3°F | Ht 66.0 in | Wt 202.0 lb

## 2023-01-11 DIAGNOSIS — M25532 Pain in left wrist: Secondary | ICD-10-CM | POA: Insufficient documentation

## 2023-01-11 MED ORDER — LIDOCAINE HCL 1 % IJ SOLN
1.0000 mL | Freq: Once | INTRAMUSCULAR | Status: AC
  Administered 2023-01-11: 1 mL

## 2023-01-11 MED ORDER — BETAMETHASONE SOD PHOS & ACET 6 (3-3) MG/ML IJ SUSP
12.0000 mg | Freq: Once | INTRAMUSCULAR | Status: AC
  Administered 2023-01-11: 12 mg via INTRAMUSCULAR

## 2023-01-11 NOTE — Patient Instructions (Signed)
De Quervain's Tenosynovitis  De Quervain's tenosynovitis is a condition that causes inflammation of the tendon on the thumb side of the wrist. Tendons are cords of tissue that connect bones to muscles. The tendons in the hand pass through a tunnel called a sheath. A slippery layer of tissue (synovium) lets the tendons move smoothly in the sheath. With de Quervain's tenosynovitis, the sheath swells or thickens, causing friction and pain. The condition is also called de Quervain's disease and de Quervain's syndrome. It occurs most often in women who are 30-50 years old. What are the causes? The exact cause of this condition is not known. It may be associated with overuse of the hand and wrist. What increases the risk? You are more likely to develop this condition if you: Use your hands far more than normal, especially if you repeat certain movements that involve twisting your hand or using a tight grip. Are pregnant. Are a middle-aged woman. Have rheumatoid arthritis. Have diabetes. What are the signs or symptoms? The main symptom of this condition is pain on the thumb side of the wrist. The pain may get worse when you grasp something or turn your wrist. Other symptoms may include: Pain that extends up the forearm. Swelling of your wrist and hand. Trouble moving the thumb and wrist. A sensation of snapping in the wrist. A bump filled with fluid (cyst) in the area of the pain. How is this diagnosed? This condition may be diagnosed based on: Your symptoms and medical history. A physical exam. During the exam, your health care provider may do a simple test (Finkelstein test) that involves pulling your thumb and wrist to see if this causes pain. You may also need to have an X-ray or ultrasound. How is this treated? Treatment for this condition may include: Avoiding any activity that causes pain and swelling. Taking medicines. Anti-inflammatory medicines and corticosteroid injections may be used  to reduce inflammation and relieve pain. Wearing a splint. Having surgery. This may be needed if other treatments do not work. Once the pain and swelling have gone down, you may start: Physical therapy. This includes exercises to improve movement and strength in your wrist and thumb. Occupational therapy. This includes adjusting how you move your wrist. Follow these instructions at home: If you have a splint: Wear the splint as told by your health care provider. Remove it only as told by your health care provider. Loosen the splint if your fingers tingle, become numb, or turn cold and blue. Keep the splint clean. If the splint is not waterproof: Do not let it get wet. Cover it with a watertight covering when you take a bath or a shower. Managing pain, stiffness, and swelling  Avoid movements and activities that cause pain and swelling in the wrist area. If directed, put ice on the painful area. This may be helpful after doing activities that involve the sore wrist. To do this: Put ice in a plastic bag. Place a towel between your skin and the bag. Leave the ice on for 20 minutes, 2-3 times a day. Remove the ice if your skin turns bright red. This is very important. If you cannot feel pain, heat, or cold, you have a greater risk of damage to the area. Move your fingers often to reduce stiffness and swelling. Raise (elevate) the injured area above the level of your heart while you are sitting or lying down. General instructions Return to your normal activities as told by your health care provider. Ask your   health care provider what activities are safe for you. Take over-the-counter and prescription medicines only as told by your health care provider. Keep all follow-up visits. This is important. Contact a health care provider if: Your pain medicine does not help. Your pain gets worse. You develop new symptoms. Summary De Quervain's tenosynovitis is a condition that causes inflammation  of the tendon on the thumb side of the wrist. The condition occurs most often in women who are 30-50 years old. The exact cause of this condition is not known. It may be associated with overuse of the hand and wrist. Treatment starts with avoiding activity that causes pain or swelling in the wrist area. Other treatments may include wearing a splint and taking medicine. Sometimes, surgery is needed. This information is not intended to replace advice given to you by your health care provider. Make sure you discuss any questions you have with your health care provider. Document Revised: 01/21/2020 Document Reviewed: 01/21/2020 Elsevier Patient Education  2023 [employer]  

## 2023-01-11 NOTE — Progress Notes (Signed)
First extensor compartment of the wrist injection under ultrasound guidance.  LEFT  Indication : De Quervain's tenosynovitis, not reponsive , NSAIDs , icing and other conservative care  Pt in seated position.  Wrist in neutral position.  Area just proximal to the Radial styloid was scanned using 15hz  linear transducer, the EPB and APL tendons were followed proximally until confluent.  Tendon sheath identified.  Gel removed and betadine applied liberally to radial aspect of the wrist.  Sterile transducer cover , gel and gloves utilized for remainder of procedure.  30g 1/2in needle was used to inject .64ml of 1% lidocaine into sub cutaneous tissue.  Needle entered tendon sheath and additional .25ml injected, then a mixture of .56ml lidocaine and .26ml celestone (6mg /ml) was injected . Pt tolerated procedure well.

## 2023-01-12 ENCOUNTER — Encounter: Payer: Self-pay | Admitting: Family Medicine

## 2023-01-12 ENCOUNTER — Ambulatory Visit (HOSPITAL_BASED_OUTPATIENT_CLINIC_OR_DEPARTMENT_OTHER): Payer: Federal, State, Local not specified - PPO | Admitting: Physical Therapy

## 2023-01-12 ENCOUNTER — Ambulatory Visit (INDEPENDENT_AMBULATORY_CARE_PROVIDER_SITE_OTHER): Payer: Federal, State, Local not specified - PPO | Admitting: Family Medicine

## 2023-01-12 ENCOUNTER — Encounter (HOSPITAL_BASED_OUTPATIENT_CLINIC_OR_DEPARTMENT_OTHER): Payer: Self-pay | Admitting: Physical Therapy

## 2023-01-12 VITALS — BP 100/80 | HR 80 | Temp 97.6°F | Resp 18 | Ht 66.0 in | Wt 200.0 lb

## 2023-01-12 DIAGNOSIS — M5136 Other intervertebral disc degeneration, lumbar region: Secondary | ICD-10-CM

## 2023-01-12 DIAGNOSIS — E1149 Type 2 diabetes mellitus with other diabetic neurological complication: Secondary | ICD-10-CM

## 2023-01-12 DIAGNOSIS — M5441 Lumbago with sciatica, right side: Secondary | ICD-10-CM | POA: Diagnosis not present

## 2023-01-12 DIAGNOSIS — G8929 Other chronic pain: Secondary | ICD-10-CM

## 2023-01-12 DIAGNOSIS — M5459 Other low back pain: Secondary | ICD-10-CM

## 2023-01-12 DIAGNOSIS — M5442 Lumbago with sciatica, left side: Secondary | ICD-10-CM

## 2023-01-12 MED ORDER — TIRZEPATIDE 5 MG/0.5ML ~~LOC~~ SOAJ: 5.0000 mg | SUBCUTANEOUS | 0 refills | Status: DC

## 2023-01-12 NOTE — Progress Notes (Signed)
Subjective:   By signing my name below, I, Marlana Latus, attest that this documentation has been prepared under the direction and in the presence of [name], DO 01/12/23   Patient ID: Emily Maddox, female    DOB: July 17, 1971, 52 y.o.   MRN: [id number]  Chief Complaint  Patient presents with   Back Pain    Pt states sxs have gotten worse over the last 2 weeks. No falls or injuries.    Leg Pain    HPI Patient is in today for an office visit.   She complains of chronic back pain, bilateral leg pain with associated thigh cramps. Her pain has recently worsened. She describes a burning sensation in her thighs that tends to stop at the knee. She endorses feeling weakness in her legs. She has been doing aquatic PT and plans to go this evening.   She has not yet started Mounjaro, 2.5 mg. She has had concerns about possible side effects of the medication. After further discussion today, she is receptive to starting it soon. She has been unable to exercise regularly due to chronic pain.   She states taking Emily Maddox has helped with her hot flashes.   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   Diabetes mellitus    Hyperlipidemia    Tachycardia     Past Surgical History:  Procedure Laterality Date   BREAST EXCISIONAL BIOPSY Left 2003    Family History  Problem Relation Age of Onset   Cancer Mother 52       breast   Hypertension Mother    Hyperlipidemia Mother    Diabetes Mother    Breast cancer Mother 47   Cancer Father 69       colon   Hypertension Father    Hyperlipidemia Father    Heart disease Father        a fib   Diabetes Father    Diabetes Sister    Breast cancer Other    Colon cancer Other     Social History   Socioeconomic History   Marital status: Single    Spouse name: Not on file   Number of children: Not on file   Years of education: Not on file   Highest  education level: Bachelor's degree (e.g., BA, AB, BS)  Occupational History    Employer: [employer]   Occupation: [employer]  Tobacco Use   Smoking status: Every Day    Packs/day: 1.00    Years: 19.00    Additional pack years: 0.00    Total pack years: 19.00    Types: Cigarettes   Smokeless tobacco: Never   Tobacco comments:    couldn't tolerate chantix, cut down to 0.5 pack since 11/2013  Vaping Use   Vaping Use: Never used  Substance and Sexual Activity   Alcohol use: No   Drug use: No   Sexual activity: Yes    Partners: Female  Other Topics Concern   Not on file  Social History Narrative   Lives alone.     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 in business administration   Exercise--no     R handed   Caffeine: 3 c of coffee a day   Social Determinants of Health   Financial Resource Strain: Low Risk  (03/31/2021)   Overall Financial Resource Strain (CARDIA)    Difficulty of Paying Living Expenses: Not hard at all  Food Insecurity: No Food Insecurity (03/31/2021)   Hunger Vital Sign    Worried About Running Out of Food in the Last Year: Never true    Ran Out of Food in the Last Year: Never true  Transportation Needs: No Transportation Needs (03/31/2021)   PRAPARE - Hydrologist (Medical): No    Lack of Transportation (Non-Medical): No  Physical Activity: Sufficiently Active (03/31/2021)   Exercise Vital Sign    Days of Exercise per Week: 3 days    Minutes of Exercise per Session: 50 min  Stress: Stress Concern Present (03/31/2021)   [place]    Feeling of Stress : To some extent  Social Connections: Moderately Isolated (03/31/2021)   Social Connection and Isolation Panel [NHANES]    Frequency of Communication with Friends and Family: More than three times a week    Frequency of Social Gatherings with Friends and Family: More than three times a week    Attends Religious Services: More than 4 times  per year    Active Member of [employer] or Organizations: Not on file    Attends Archivist Meetings: Never    Marital Status: Never married  Intimate Partner Violence: Not At Risk (03/31/2021)   Humiliation, Afraid, Rape, and Kick questionnaire    Fear of Current or Ex-Partner: No    Emotionally Abused: No    Physically Abused: No    Sexually Abused: No    Outpatient Medications Prior to Visit  Medication Sig Dispense Refill   Blood Glucose Monitoring Suppl ([place]) w/Device KIT 1 each by Does not apply route daily. Dx code: E11.9 1 kit 0   Fezolinetant (VEOZAH) 45 MG TABS Take 1 tablet (45 mg total) by mouth daily. 30 tablet    glucose blood (ONETOUCH VERIO) test strip USE 1 STRIP TO CHECK GLUCOSE ONCE DAILY 100 each 12   HYDROcodone-acetaminophen (NORCO) 10-325 MG tablet Take 1 tablet by mouth every 6 (six) hours as needed. 60 tablet 0   Multiple Vitamin (MULTIVITAMIN) tablet Take 1 tablet by mouth daily.     ONETOUCH DELICA LANCETS 99991111 MISC Check sugar every day.  Dx Code: E11.9 100 each 1   Probiotic Product (PROBIOTIC DAILY PO) Take 1 capsule by mouth daily.     rosuvastatin (CRESTOR) 40 MG tablet Take 1 tablet (40 mg total) by mouth daily. 90 tablet 1   VITAMIN D PO Take 2,000 Units by mouth daily.     Vitamin D, Ergocalciferol, (DRISDOL) 1.25 MG (50000 UNIT) CAPS capsule Take 1 capsule (50,000 Units total) by mouth every 7 (seven) days. 12 capsule 1   MOUNJARO 2.5 MG/0.5ML Pen INJECT 2.5 MG SUBCUTANEOUSLY  ONCE A WEEK 4 mL 1   No facility-administered medications prior to visit.    Allergies  Allergen Reactions   Metoprolol Other (See Comments)    exhaustion   Iodinated Contrast Media    Latex    Shellfish Allergy    Ultram [Tramadol] Palpitations    Review of Systems  Constitutional:  Negative for fever and malaise/fatigue.  HENT:  Negative for congestion.   Eyes:  Negative for blurred vision.  Respiratory:  Negative for shortness of breath.    Cardiovascular:  Negative for chest pain, palpitations and leg swelling.  Gastrointestinal:  Negative for abdominal pain, blood in stool and nausea.  Genitourinary:  Negative for dysuria and frequency.  Musculoskeletal:  Positive for back pain and myalgias (  bilteral lower extremity pain (thighs) and cramps). Negative for falls.  Skin:  Negative for rash.  Neurological:  Positive for weakness (bilateral lower extremities). Negative for dizziness, loss of consciousness and headaches.  Endo/Heme/Allergies:  Negative for environmental allergies.  Psychiatric/Behavioral:  Negative for depression. The patient is not nervous/anxious.        Objective:    Physical Exam Vitals and nursing note reviewed.  Constitutional:      General: She is not in acute distress.    Appearance: She is well-developed. She is not diaphoretic.  HENT:     Head: Normocephalic and atraumatic.     Right Ear: External ear normal.     Left Ear: External ear normal.     Nose: Nose normal.  Eyes:     General:        Right eye: No discharge.        Left eye: No discharge.     Conjunctiva/sclera: Conjunctivae normal.     Pupils: Pupils are equal, round, and reactive to light.  Neck:     Thyroid: No thyromegaly.     Vascular: No carotid bruit or JVD.  Cardiovascular:     Rate and Rhythm: Normal rate and regular rhythm.     Heart sounds: Normal heart sounds. No murmur heard. Pulmonary:     Effort: Pulmonary effort is normal. No respiratory distress.     Breath sounds: Normal breath sounds. No wheezing or rales.  Chest:     Chest wall: No tenderness.  Abdominal:     General: Bowel sounds are normal. There is no distension.     Palpations: Abdomen is soft. There is no mass.     Tenderness: There is no abdominal tenderness. There is no guarding or rebound.  Genitourinary:    Vagina: Normal. No vaginal discharge.     Rectum: Guaiac result negative.  Musculoskeletal:        General: Tenderness present.      Cervical back: Normal range of motion and neck supple.     Lumbar back: Spasms and tenderness present. Decreased range of motion. Negative right straight leg raise test and negative left straight leg raise test.  Lymphadenopathy:     Cervical: No cervical adenopathy.  Skin:    General: Skin is warm and dry.     Findings: No erythema or rash.  Neurological:     Mental Status: She is alert and oriented to person, place, and time.     Cranial Nerves: No cranial nerve deficit.     Deep Tendon Reflexes: Reflexes are normal and symmetric.  Psychiatric:        Behavior: Behavior normal.        Thought Content: Thought content normal.        Judgment: Judgment normal.    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  Dispense: 6 mL; Refill: 0     I,Rachel Rivera,acting as a scribe for [name], DO.,have documented all relevant documentation on the behalf of [name], DO,as directed by  [name], DO while in the presence of [name], DO.   I, [name], DO, personally preformed the services described in this documentation.  All medical record entries made by the scribe were at my direction and in my presence.  I have reviewed the chart and discharge instructions (if applicable) and agree that the record  reflects my personal performance and is accurate and complete. 01/12/23   [name], DO
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
## 2023-01-19 ENCOUNTER — Encounter: Payer: Self-pay | Admitting: Family Medicine

## 2023-01-19 ENCOUNTER — Other Ambulatory Visit: Payer: Self-pay | Admitting: Family Medicine
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
## 2023-01-22 ENCOUNTER — Ambulatory Visit (HOSPITAL_BASED_OUTPATIENT_CLINIC_OR_DEPARTMENT_OTHER)
Payer: Federal, State, Local not specified - PPO | Attending: Physical Medicine & Rehabilitation | Admitting: Physical Therapy

## 2023-01-22 ENCOUNTER — Encounter (HOSPITAL_BASED_OUTPATIENT_CLINIC_OR_DEPARTMENT_OTHER): Payer: Self-pay | Admitting: Physical Therapy

## 2023-01-22 DIAGNOSIS — M5459 Other low back pain: Secondary | ICD-10-CM | POA: Diagnosis not present

## 2023-01-22 DIAGNOSIS — M5136 Other intervertebral disc degeneration, lumbar region: Secondary | ICD-10-CM | POA: Insufficient documentation

## 2023-01-22 MED ORDER — HYDROCODONE-ACETAMINOPHEN 10-325 MG PO TABS: 1.0000 | ORAL_TABLET | Freq: Four times a day (QID) | ORAL | 0 refills | Status: DC | PRN

## 2023-01-22 NOTE — Telephone Encounter (Signed)
Requesting: Norco 10/325 mg Contract: 11/11/2021 UDS:  01/02/2022 Last Visit: 01/12/2023 Next Visit: not scheduled Last Refill: 12/25/2022 #60 no refills  Please Advise

## 2023-01-22 NOTE — Telephone Encounter (Signed)
Forms printed and placed in folder.

## 2023-01-24 ENCOUNTER — Ambulatory Visit: Payer: Federal, State, Local not specified - PPO | Admitting: Medical

## 2023-01-26 ENCOUNTER — Ambulatory Visit (HOSPITAL_BASED_OUTPATIENT_CLINIC_OR_DEPARTMENT_OTHER): Payer: Federal, State, Local not specified - PPO | Admitting: Physical Therapy

## 2023-01-31 ENCOUNTER — Ambulatory Visit (HOSPITAL_BASED_OUTPATIENT_CLINIC_OR_DEPARTMENT_OTHER): Payer: Federal, State, Local not specified - PPO | Admitting: Physical Therapy

## 2023-01-31 ENCOUNTER — Encounter (HOSPITAL_BASED_OUTPATIENT_CLINIC_OR_DEPARTMENT_OTHER): Payer: Self-pay | Admitting: Physical Therapy

## 2023-01-31 DIAGNOSIS — M5459 Other low back pain: Secondary | ICD-10-CM

## 2023-01-31 DIAGNOSIS — M5136 Other intervertebral disc degeneration, lumbar region: Secondary | ICD-10-CM | POA: Diagnosis not present
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
## 2023-02-02 ENCOUNTER — Ambulatory Visit (HOSPITAL_BASED_OUTPATIENT_CLINIC_OR_DEPARTMENT_OTHER): Payer: Federal, State, Local not specified - PPO | Admitting: Physical Therapy

## 2023-02-12 ENCOUNTER — Other Ambulatory Visit: Payer: Self-pay | Admitting: Family Medicine

## 2023-02-12 DIAGNOSIS — E1149 Type 2 diabetes mellitus with other diabetic neurological complication: Secondary | ICD-10-CM

## 2023-02-16 ENCOUNTER — Encounter: Payer: Self-pay | Admitting: Physical Medicine & Rehabilitation

## 2023-02-16 ENCOUNTER — Encounter
Payer: Federal, State, Local not specified - PPO | Attending: Physical Medicine & Rehabilitation | Admitting: Physical Medicine & Rehabilitation

## 2023-02-16 VITALS — BP 120/84 | HR 92 | Ht 66.0 in | Wt 195.0 lb

## 2023-02-16 DIAGNOSIS — M25532 Pain in left wrist: Secondary | ICD-10-CM | POA: Insufficient documentation

## 2023-02-16 NOTE — Progress Notes (Signed)
Subjective:    Patient ID: Emily Maddox, female    DOB: 06-24-1971, 52 y.o.   MRN: [id number]  HPI  52 year old female with primary complaint of left wrist pain.  Pain is mainly around the base of the thumb and the radial aspect of the wrist.  No falls or trauma to the area. Onset was several months ago.  She does not recall any specific injury.  She works for the [employer], [employer] and does a lot of typing and paperwork.  She is right-hand dominant. She underwent ultrasound-guided first extensor compartment of the wrist injection under ultrasound guidance on 01/11/2023 she has short-term relief of a few days but really nothing long-term. She had EMG/NCV on 12/05/2022 showing no electrodiagnostic evidence of carpal tunnel syndrome.  She did have some slowing of ulnar nerve conduction across the elbow however she has not complained of any pain in the little finger or on the ulnar side of the wrist. Pain Inventory Average Pain 6 Pain Right Now 5 My pain is dull and aching  In the last 24 hours, has pain interfered with the following? General activity 5 Relation with others 5 Enjoyment of life 5 What TIME of day is your pain at its worst? morning  Sleep (in general) Poor  Pain is worse with: walking, sitting, standing, and some activites Pain improves with: rest, heat/ice, and medication Relief from Meds: 3  Family History  Problem Relation Age of Onset   Cancer Mother 71       breast   Hypertension Mother    Hyperlipidemia Mother    Diabetes Mother    Breast cancer Mother 46   Cancer Father 53       colon   Hypertension Father    Hyperlipidemia Father    Heart disease Father        a fib   Diabetes Father    Diabetes Sister    Breast cancer Other    Colon cancer Other    Social History   Socioeconomic History   Marital status: Single    Spouse name: Not on file   Number of children: Not on file   Years of education: Not on file    Highest education level: Bachelor's degree (e.g., BA, AB, BS)  Occupational History    Employer: [employer]   Occupation: [employer]  Tobacco Use   Smoking status: Every Day    Packs/day: 1.00    Years: 19.00    Additional pack years: 0.00    Total pack years: 19.00    Types: Cigarettes   Smokeless tobacco: Never   Tobacco comments:    couldn't tolerate chantix, cut down to 0.5 pack since 11/2013  Vaping Use   Vaping Use: Never used  Substance and Sexual Activity   Alcohol use: No   Drug use: No   Sexual activity: Yes    Partners: Female  Other Topics Concern   Not on file  Social History Narrative   Lives alone.     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 in business administration   Exercise--no     R handed   Caffeine: 3 c of coffee a day   Social Determinants of Health   Financial Resource Strain: Low Risk  (03/31/2021)   Overall Financial Resource Strain (CARDIA)    Difficulty of Paying Living Expenses: Not hard at all  Food Insecurity: No Food Insecurity (03/31/2021)   Hunger Vital Sign    Worried About Running  Out of Food in the Last Year: Never true    Ran Out of Food in the Last Year: Never true  Transportation Needs: No Transportation Needs (03/31/2021)   PRAPARE - Administrator, Civil Service (Medical): No    Lack of Transportation (Non-Medical): No  Physical Activity: Sufficiently Active (03/31/2021)   Exercise Vital Sign    Days of Exercise per Week: 3 days    Minutes of Exercise per Session: 50 min  Stress: Stress Concern Present (03/31/2021)   [employer] of Occupational Health - Occupational Stress Questionnaire    Feeling of Stress : To some extent  Social Connections: Moderately Isolated (03/31/2021)   Social Connection and Isolation Panel [NHANES]    Frequency of Communication with Friends and Family: More than three times a week    Frequency of Social Gatherings with Friends and Family: More than three times a week    Attends Religious Services: More than 4  times per year    Active Member of [employer] or Organizations: Not on file    Attends Banker Meetings: Never    Marital Status: Never married   Past Surgical History:  Procedure Laterality Date   BREAST EXCISIONAL BIOPSY Left 2003   Past Surgical History:  Procedure Laterality Date   BREAST EXCISIONAL BIOPSY Left 2003   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   Diabetes mellitus    Hyperlipidemia    Tachycardia    Ht 5\' 6"  (1.676 m)   BMI 32.28 kg/m   Opioid Risk Score:   Fall Risk Score:  `1  Depression screen [facility] 2/9     01/11/2023    3:22 PM 12/05/2022    2:56 PM 11/20/2022   10:54 AM 10/20/2022    1:38 PM 04/14/2022    9:54 AM 04/06/2022    3:45 PM 03/31/2021    3:05 PM  Depression screen PHQ 2/9  Decreased Interest 0 0 0 1 2 0 0  Down, Depressed, Hopeless 0 0 1 1 0 0 0  PHQ - 2 Score 0 0 1 2 2  0 0  Altered sleeping   3 3 3     Tired, decreased energy   3 3 3     Change in appetite   0 0 0    Feeling bad or failure about yourself    0 1 0    Trouble concentrating   0 3 3    Moving slowly or fidgety/restless   0 0 0    Suicidal thoughts   0 0 0    PHQ-9 Score   7 12 11     Difficult doing work/chores   Very difficult Very difficult Extremely dIfficult        Review of Systems  Musculoskeletal:        Left hand pain  All other systems reviewed and are negative.     Objective:   Physical Exam General no acute distress Mood and affect are appropriate Extremities without edema No pain with left first carpometacarpal dorsiflexion No tenderness over the anatomic snuffbox Positive Finkelstein's Grip strength is mildly reduced on the left side compared to the right side Sensation is intact       Assessment & Plan:   1.  Stenosing tendon synovitis left wrist DeQuervain's syndrome Only short-term relief with injection.  Will make referral to OT hand therapist.   Would like to see her back  in approximately 2 months to follow-up on this.  She would really like to avoid surgery if possible.  [facility] OT hand eval

## 2023-02-16 NOTE — Patient Instructions (Signed)
Voltaren gel breakfast lunch dinner and bedtime

## 2023-02-19 ENCOUNTER — Other Ambulatory Visit: Payer: Self-pay | Admitting: Family Medicine

## 2023-02-19 MED ORDER — HYDROCODONE-ACETAMINOPHEN 10-325 MG PO TABS: 1.0000 | ORAL_TABLET | Freq: Four times a day (QID) | ORAL | 0 refills | Status: DC | PRN

## 2023-02-19 NOTE — Telephone Encounter (Signed)
Emily Maddox Contract: 2023 UDS: 2023 Last OV: 01/12/23 Next OV: N/A Last Refill: 01/22/2023, #[id number] RF Database:   Please advise

## 2023-02-19 NOTE — Telephone Encounter (Signed)
Refill sent.

## 2023-02-19 NOTE — Addendum Note (Signed)
Addended by: Roxanne Gates on: 02/19/2023 11:36 AM   Modules accepted: Orders

## 2023-02-19 NOTE — Telephone Encounter (Signed)
Prescription Request  02/19/2023  Is this a "Controlled Substance" medicine? Yes  LOV: 01/12/2023  What is the name of the medication or equipment? HYDROcodone-acetaminophen (NORCO) 10-325 MG tablet   Have you contacted your pharmacy to request a refill? No   Which pharmacy would you like this sent to?   [facility] [address], [place] - [address] [place] [address] Phone: [phone] Fax: [phone]    Patient notified that their request is being sent to the clinical staff for review and that they should receive a response within 2 business days.   Please advise at Mobile [phone] (mobile)

## 2023-02-21 ENCOUNTER — Ambulatory Visit (HOSPITAL_BASED_OUTPATIENT_CLINIC_OR_DEPARTMENT_OTHER)
Payer: Federal, State, Local not specified - PPO | Attending: Physical Medicine & Rehabilitation | Admitting: Physical Therapy

## 2023-02-21 ENCOUNTER — Encounter (HOSPITAL_BASED_OUTPATIENT_CLINIC_OR_DEPARTMENT_OTHER): Payer: Self-pay | Admitting: Physical Therapy

## 2023-02-21 DIAGNOSIS — M5459 Other low back pain: Secondary | ICD-10-CM | POA: Diagnosis not present

## 2023-02-21 DIAGNOSIS — M5136 Other intervertebral disc degeneration, lumbar region: Secondary | ICD-10-CM | POA: Diagnosis not present

## 2023-02-21 DIAGNOSIS — M51369 Other intervertebral disc degeneration, lumbar region without mention of lumbar back pain or lower extremity pain: Secondary | ICD-10-CM
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
## 2023-02-23 ENCOUNTER — Ambulatory Visit (HOSPITAL_BASED_OUTPATIENT_CLINIC_OR_DEPARTMENT_OTHER): Payer: Federal, State, Local not specified - PPO | Admitting: Physical Therapy

## 2023-02-23 ENCOUNTER — Encounter: Payer: Federal, State, Local not specified - PPO | Admitting: Physical Medicine & Rehabilitation

## 2023-02-23 DIAGNOSIS — M5459 Other low back pain: Secondary | ICD-10-CM | POA: Diagnosis not present

## 2023-02-23 DIAGNOSIS — M5136 Other intervertebral disc degeneration, lumbar region: Secondary | ICD-10-CM
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
## 2023-02-28 DIAGNOSIS — M25642 Stiffness of left hand, not elsewhere classified: Secondary | ICD-10-CM | POA: Diagnosis not present

## 2023-02-28 DIAGNOSIS — M79642 Pain in left hand: Secondary | ICD-10-CM | POA: Diagnosis not present

## 2023-02-28 DIAGNOSIS — M25532 Pain in left wrist: Secondary | ICD-10-CM | POA: Diagnosis not present

## 2023-02-28 DIAGNOSIS — M25632 Stiffness of left wrist, not elsewhere classified: Secondary | ICD-10-CM | POA: Diagnosis not present

## 2023-03-02 DIAGNOSIS — M79642 Pain in left hand: Secondary | ICD-10-CM | POA: Diagnosis not present

## 2023-03-02 DIAGNOSIS — M25632 Stiffness of left wrist, not elsewhere classified: Secondary | ICD-10-CM | POA: Diagnosis not present

## 2023-03-02 DIAGNOSIS — M25532 Pain in left wrist: Secondary | ICD-10-CM | POA: Diagnosis not present

## 2023-03-02 DIAGNOSIS — M25642 Stiffness of left hand, not elsewhere classified: Secondary | ICD-10-CM | POA: Diagnosis not present

## 2023-03-20 ENCOUNTER — Other Ambulatory Visit: Payer: Self-pay | Admitting: Family Medicine

## 2023-03-20 NOTE — Telephone Encounter (Signed)
Patient called to request for a prescription for Mounjaro 2.5 mg or whatever the lowest dose is as well. Pt said she is not ready to move up to the next dose yet as medicine is strong.    Prescription Request  03/20/2023  Is this a "Controlled Substance" medicine? Yes  LOV: 01/12/2023  What is the name of the medication or equipment?  HYDROcodone-acetaminophen (NORCO) 10-325 MG tablet  Have you contacted your pharmacy to request a refill? No   Which pharmacy would you like this sent to?    [facility] [address], [place] - [address] [place] [address] Phone: [phone] Fax: [phone]  Patient notified that their request is being sent to the clinical staff for review and that they should receive a response within 2 business days.   Please advise at Mobile [phone] (mobile)

## 2023-03-21 MED ORDER — HYDROCODONE-ACETAMINOPHEN 10-325 MG PO TABS: 1.0000 | ORAL_TABLET | Freq: Four times a day (QID) | ORAL | 0 refills | Status: DC | PRN

## 2023-03-21 NOTE — Addendum Note (Signed)
Addended by: Roxanne Gates on: 03/21/2023 10:08 AM   Modules accepted: Orders

## 2023-03-21 NOTE — Telephone Encounter (Signed)
Patient called to request for a prescription for Mounjaro 2.5 mg or whatever the lowest dose is as well. Pt said she is not ready to move up to the next dose yet as medicine is strong.          Requesting: Norco Contract: 01/02/22 UDS: 01/02/22 Last OV: 01/12/23 Next OV: N/A Last Refill: 02/19/23, #[id number] RF Database:   Please advise

## 2023-03-28 ENCOUNTER — Ambulatory Visit (HOSPITAL_BASED_OUTPATIENT_CLINIC_OR_DEPARTMENT_OTHER): Payer: Federal, State, Local not specified - PPO | Admitting: Physical Therapy

## 2023-03-30 ENCOUNTER — Encounter (HOSPITAL_BASED_OUTPATIENT_CLINIC_OR_DEPARTMENT_OTHER): Payer: Federal, State, Local not specified - PPO | Admitting: Physical Therapy

## 2023-04-02 ENCOUNTER — Ambulatory Visit (HOSPITAL_BASED_OUTPATIENT_CLINIC_OR_DEPARTMENT_OTHER)
Payer: Federal, State, Local not specified - PPO | Attending: Physical Medicine & Rehabilitation | Admitting: Physical Therapy

## 2023-04-02 DIAGNOSIS — M5459 Other low back pain: Secondary | ICD-10-CM | POA: Insufficient documentation

## 2023-04-02 DIAGNOSIS — M5136 Other intervertebral disc degeneration, lumbar region: Secondary | ICD-10-CM | POA: Insufficient documentation

## 2023-04-04 ENCOUNTER — Ambulatory Visit (HOSPITAL_BASED_OUTPATIENT_CLINIC_OR_DEPARTMENT_OTHER): Payer: Federal, State, Local not specified - PPO | Admitting: Physical Therapy

## 2023-04-13 ENCOUNTER — Encounter: Payer: Federal, State, Local not specified - PPO | Admitting: Physical Medicine & Rehabilitation

## 2023-04-18 ENCOUNTER — Other Ambulatory Visit: Payer: Self-pay | Admitting: Family Medicine

## 2023-04-18 MED ORDER — HYDROCODONE-ACETAMINOPHEN 10-325 MG PO TABS: 1.0000 | ORAL_TABLET | Freq: Four times a day (QID) | ORAL | 0 refills | Status: DC | PRN

## 2023-04-18 NOTE — Telephone Encounter (Signed)
Requesting: NORCO Contract: 01/02/22 UDS: 01/02/22 Last OV: 01/12/23 Next OV: 04/27/23 Last Refill: 03/21/23, #60--0 RF Database:   Please advise

## 2023-04-18 NOTE — Telephone Encounter (Signed)
Medication: HYDROcodone-acetaminophen (NORCO) 10-325 MG tablet   Has the patient contacted their pharmacy? No.   Preferred Pharmacy:   [id number] - [place], [place] - [address]   [address], [place] [address]  Phone:  [phone]  Fax:  [phone] 

## 2023-04-27 ENCOUNTER — Encounter: Payer: Self-pay | Admitting: Family Medicine

## 2023-04-27 ENCOUNTER — Ambulatory Visit: Payer: Federal, State, Local not specified - PPO | Admitting: Family Medicine

## 2023-04-27 VITALS — BP 108/80 | HR 107 | Temp 97.7°F | Resp 18 | Ht 66.0 in | Wt 199.2 lb

## 2023-04-27 DIAGNOSIS — N3281 Overactive bladder: Secondary | ICD-10-CM

## 2023-04-27 DIAGNOSIS — I10 Essential (primary) hypertension: Secondary | ICD-10-CM | POA: Diagnosis not present

## 2023-04-27 DIAGNOSIS — Z79899 Other long term (current) drug therapy: Secondary | ICD-10-CM

## 2023-04-27 DIAGNOSIS — R8290 Unspecified abnormal findings in urine: Secondary | ICD-10-CM

## 2023-04-27 DIAGNOSIS — E785 Hyperlipidemia, unspecified: Secondary | ICD-10-CM | POA: Diagnosis not present

## 2023-04-27 DIAGNOSIS — E1149 Type 2 diabetes mellitus with other diabetic neurological complication: Secondary | ICD-10-CM

## 2023-04-27 DIAGNOSIS — E119 Type 2 diabetes mellitus without complications: Secondary | ICD-10-CM | POA: Diagnosis not present

## 2023-04-27 DIAGNOSIS — G4700 Insomnia, unspecified: Secondary | ICD-10-CM

## 2023-04-27 DIAGNOSIS — R5383 Other fatigue: Secondary | ICD-10-CM | POA: Diagnosis not present

## 2023-04-27 DIAGNOSIS — E1169 Type 2 diabetes mellitus with other specified complication: Secondary | ICD-10-CM | POA: Diagnosis not present

## 2023-04-27 DIAGNOSIS — E538 Deficiency of other specified B group vitamins: Secondary | ICD-10-CM

## 2023-04-27 DIAGNOSIS — E1165 Type 2 diabetes mellitus with hyperglycemia: Secondary | ICD-10-CM

## 2023-04-27 DIAGNOSIS — G894 Chronic pain syndrome: Secondary | ICD-10-CM

## 2023-04-27 DIAGNOSIS — Z7984 Long term (current) use of oral hypoglycemic drugs: Secondary | ICD-10-CM

## 2023-04-27 DIAGNOSIS — M797 Fibromyalgia: Secondary | ICD-10-CM

## 2023-04-27 LAB — CBC WITH DIFFERENTIAL/PLATELET
Basophils Absolute: 0.1 10*3/uL (ref 0.0–0.1)
Basophils Relative: 0.7 % (ref 0.0–3.0)
Eosinophils Absolute: 0.2 10*3/uL (ref 0.0–0.7)
Eosinophils Relative: 2.3 % (ref 0.0–5.0)
HCT: 41 % (ref 36.0–46.0)
Hemoglobin: 13.7 g/dL (ref 12.0–15.0)
Lymphocytes Relative: 29.6 % (ref 12.0–46.0)
Lymphs Abs: 2.5 10*3/uL (ref 0.7–4.0)
MCHC: 33.3 g/dL (ref 30.0–36.0)
MCV: 98.3 fl (ref 78.0–100.0)
Monocytes Absolute: 0.6 10*3/uL (ref 0.1–1.0)
Monocytes Relative: 7.2 % (ref 3.0–12.0)
Neutro Abs: 5 10*3/uL (ref 1.4–7.7)
Neutrophils Relative %: 60.2 % (ref 43.0–77.0)
Platelets: 293 10*3/uL (ref 150.0–400.0)
RBC: 4.18 Mil/uL (ref 3.87–5.11)
RDW: 13.6 % (ref 11.5–15.5)
WBC: 8.3 10*3/uL (ref 4.0–10.5)

## 2023-04-27 LAB — POC URINALSYSI DIPSTICK (AUTOMATED)
Blood, UA: NEGATIVE
Glucose, UA: NEGATIVE
Nitrite, UA: NEGATIVE
Protein, UA: NEGATIVE
Spec Grav, UA: 1.02 (ref 1.010–1.025)
Urobilinogen, UA: 0.2 E.U./dL
pH, UA: 5 (ref 5.0–8.0)

## 2023-04-27 LAB — COMPREHENSIVE METABOLIC PANEL
ALT: 9 U/L (ref 0–35)
AST: 13 U/L (ref 0–37)
Albumin: 4.2 g/dL (ref 3.5–5.2)
Alkaline Phosphatase: 56 U/L (ref 39–117)
BUN: 15 mg/dL (ref 6–23)
CO2: 27 mEq/L (ref 19–32)
Calcium: 9.6 mg/dL (ref 8.4–10.5)
Chloride: 104 mEq/L (ref 96–112)
Creatinine, Ser: 1 mg/dL (ref 0.40–1.20)
GFR: 65.61 mL/min (ref >60.00)
Glucose, Bld: 86 mg/dL (ref 70–99)
Potassium: 4.5 mEq/L (ref 3.5–5.1)
Sodium: 139 mEq/L (ref 135–145)
Total Bilirubin: 0.3 mg/dL (ref 0.2–1.2)
Total Protein: 7 g/dL (ref 6.0–8.3)

## 2023-04-27 LAB — VITAMIN D 25 HYDROXY (VIT D DEFICIENCY, FRACTURES): VITD: 33.73 ng/mL (ref 30.00–100.00)

## 2023-04-27 LAB — LIPID PANEL
Cholesterol: 174 mg/dL (ref 0–200)
HDL: 53.4 mg/dL (ref >39.00)
LDL Cholesterol: 105 mg/dL — ABNORMAL HIGH (ref 0–99)
NonHDL: 120.43
Total CHOL/HDL Ratio: 3
Triglycerides: 78 mg/dL (ref 0.0–149.0)
VLDL: 15.6 mg/dL (ref 0.0–40.0)

## 2023-04-27 LAB — MICROALBUMIN / CREATININE URINE RATIO
Creatinine,U: 165.8 mg/dL
Microalb Creat Ratio: 0.4 mg/g (ref 0.0–30.0)
Microalb, Ur: <0.7 mg/dL (ref 0.0–1.9)

## 2023-04-27 LAB — VITAMIN B12: Vitamin B-12: 278 pg/mL (ref 211–911)

## 2023-04-27 LAB — HEMOGLOBIN A1C: Hgb A1c MFr Bld: 6.3 % (ref 4.6–6.5)

## 2023-04-27 MED ORDER — MIRABEGRON ER 50 MG PO TB24: 50.0000 mg | ORAL_TABLET | Freq: Every day | ORAL | 2 refills | Status: DC

## 2023-04-27 MED ORDER — TRAZODONE HCL 50 MG PO TABS: 25.0000 mg | ORAL_TABLET | Freq: Every evening | ORAL | 3 refills | Status: DC | PRN

## 2023-04-27 MED ORDER — METFORMIN HCL 500 MG PO TABS: 500.0000 mg | ORAL_TABLET | Freq: Two times a day (BID) | ORAL | 3 refills | Status: DC

## 2023-04-27 NOTE — Assessment & Plan Note (Signed)
Well controlled, no changes to meds. Encouraged heart healthy diet such as the DASH diet and exercise as tolerated.  °

## 2023-04-27 NOTE — Addendum Note (Signed)
Addended by: Roxanne Gates on: 04/27/2023 11:08 AM   Modules accepted: Orders

## 2023-04-27 NOTE — Patient Instructions (Signed)

## 2023-04-27 NOTE — Progress Notes (Signed)
Established Patient Office Visit  Subjective   Patient ID: Emily Maddox, female    DOB: [birth year]  Age: 52 y.o. MRN: [id number]  Chief Complaint  Patient presents with   Diabetes   Pain   Follow-up    HPI Discussed the use of AI scribe software for clinical note transcription with the patient, who gave verbal consent to proceed.  History of Present Illness   The patient, with a history of fibroids and perimenopause, presents with fatigue, weight gain, and frequent urination. She reports feeling 'exhausted' after blood work and has been struggling with a lack of energy. She has tried a new medication for weight loss, but it caused her to feel like she had a 'cylinder block' in her stomach and she was unable to eat or drink. Despite this, she lost seven to eight pounds in two weeks.  She is currently taking Veozah for hot flashes, which has been 'life changing,' but it has not helped with her fatigue. She reports waking up multiple times during the night to urinate, which may be contributing to her fatigue. She has seen a urologist in the past who found nothing structurally wrong with her bladder, but she is considering seeing a new urologist for a medication that may help with her frequent urination.  She also has fibroids, which she believes may be growing larger and causing her to feel bloated. She has considered a hysterectomy, but her gynecologist has advised against it unless it is a full hysterectomy. She is also considering trying metformin for weight loss.      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   At risk for central sleep apnea 03/06/2022   Uncontrolled type 2 diabetes mellitus with hyperglycemia (HCC) 05/31/2020   Educated about COVID-19  virus infection 03/23/2020   Influenza 11/05/2018   Hyperglycemia 08/29/2018   Essential hypertension 08/29/2018   Myalgia 05/10/2018   DM (diabetes mellitus) type II uncontrolled, periph vascular disorder 05/10/2018   Hyperlipidemia associated with type 2 diabetes mellitus (HCC) 05/10/2018   B12 deficiency 05/10/2018   Chronic pain syndrome 05/10/2018   High risk medication use 05/10/2018   Snoring 03/26/2018   Nocturia more than twice per night 03/26/2018   Insomnia 01/28/2018   Preventative health care 03/06/2017   Fibromyalgia 07/26/2015   Bulging lumbar disc 05/22/2013   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   Hiatal hernia 08/22/2010   Fibroids 09/03/2009   Diabetes mellitus type II, controlled (HCC) 11/27/2008   Hyperlipidemia 11/27/2008   TOBACCO ABUSE 11/25/2008   Lumbar degenerative disc disease 11/25/2008   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   Diabetes mellitus    Hyperlipidemia    Tachycardia    Past Surgical History:  Procedure Laterality Date   BREAST EXCISIONAL BIOPSY Left 2003   Social History   Tobacco Use   Smoking status: Every Day    Packs/day: 1.00    Years: 19.00    Additional pack years: 0.00    Total pack years: 19.00    Types: Cigarettes   Smokeless tobacco: Never   Tobacco comments:    couldn't tolerate chantix, cut down to 0.5 pack since 11/2013  Vaping Use   Vaping Use: Never used  Substance Use Topics   Alcohol use: No   Drug use: No   Social History   Socioeconomic History   Marital status: Single    Spouse name: Not on file   Number of children: Not on file   Years of education: Not on file   Highest education level: Bachelor's degree (e.g., BA, AB, BS)  Occupational History    Employer: [employer]   Occupation: [employer]  Tobacco Use   Smoking status: Every Day    Packs/day: 1.00    Years: 19.00    Additional pack years: 0.00    Total  pack years: 19.00    Types: Cigarettes   Smokeless tobacco: Never   Tobacco comments:    couldn't tolerate chantix, cut down to 0.5 pack since 11/2013  Vaping Use   Vaping Use: Never used  Substance and Sexual Activity   Alcohol use: No   Drug use: No   Sexual activity: Yes    Partners: Female  Other Topics Concern   Not on file  Social History Narrative   Lives alone.     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 in business administration   Exercise--no     R handed   Caffeine: 3 c of coffee a day   Social Determinants of Health   Financial Resource Strain: Low Risk  (03/31/2021)   Overall Financial Resource Strain (CARDIA)    Difficulty of Paying Living Expenses: Not hard at all  Food Insecurity: No Food Insecurity (03/31/2021)   Hunger Vital Sign    Worried About Running Out of Food in the Last Year: Never true    Ran Out of Food in the Last Year: Never true  Transportation Needs: No Transportation Needs (03/31/2021)   PRAPARE - Administrator, Civil Service (Medical): No    Lack of Transportation (Non-Medical): No  Physical Activity: Sufficiently Active (03/31/2021)   Exercise Vital Sign    Days of Exercise per Week: 3 days    Minutes of Exercise per Session: 50 min  Stress: Stress Concern Present (03/31/2021)   [employer] of Occupational Health - Occupational Stress Questionnaire    Feeling of Stress : To some extent  Social Connections: Moderately Isolated (03/31/2021)   Social Connection and Isolation Panel [NHANES]    Frequency of Communication with Friends and Family: More than three times a week    Frequency of Social Gatherings with Friends and Family: More than three times a week    Attends Religious Services: More than 4 times per year    Active Member of [employer] or Organizations: Not on file    Attends Banker Meetings: Never    Marital Status: Never married  Intimate Partner Violence: Not At Risk (03/31/2021)   Humiliation, Afraid, Rape, and  Kick questionnaire    Fear of Current or Ex-Partner: No    Emotionally Abused: No    Physically Abused: No    Sexually Abused: No   Family Status  Relation Name Status   Mother  Deceased at age 36       breast CA   Father  Deceased at age 40       lawn mower accident   Sister  Alive   Brother  Alive   MGM  Deceased   MGF  Deceased   PGM  Deceased   PGF  Deceased   Other  (Not Specified)   Other  (Not Specified)   Family History  Problem Relation Age of Onset  Cancer Mother 84       breast   Hypertension Mother    Hyperlipidemia Mother    Diabetes Mother    Breast cancer Mother 51   Cancer Father 17       colon   Hypertension Father    Hyperlipidemia Father    Heart disease Father        a fib   Diabetes Father    Diabetes Sister    Breast cancer Other    Colon cancer Other    Allergies  Allergen Reactions   Metoprolol Other (See Comments)    exhaustion   Iodinated Contrast Media    Latex    Shellfish Allergy    Ultram [Tramadol] Palpitations      Review of Systems  Constitutional:  Positive for malaise/fatigue. Negative for fever.  HENT:  Negative for congestion.   Eyes:  Negative for blurred vision.  Respiratory:  Negative for shortness of breath.   Cardiovascular:  Negative for chest pain, palpitations and leg swelling.  Gastrointestinal:  Negative for abdominal pain, blood in stool and nausea.  Genitourinary:  Negative for dysuria and frequency.  Musculoskeletal:  Positive for back pain and myalgias. Negative for falls.  Skin:  Negative for rash.  Neurological:  Negative for dizziness, loss of consciousness and headaches.  Endo/Heme/Allergies:  Negative for environmental allergies.  Psychiatric/Behavioral:  Negative for depression. The patient is not nervous/anxious.       Objective:     BP 108/80 (BP Location: Right Arm, Patient Position: Sitting, Cuff Size: Large)   Pulse (!) 107   Temp 97.7 F (36.5 C) (Oral)   Resp 18   Ht 5\' 6"   (1.676 m)   Wt 199 lb 3.2 oz (90.4 kg)   SpO2 97%   BMI 32.15 kg/m  BP Readings from Last 3 Encounters:  04/27/23 108/80  02/16/23 120/84  01/12/23 100/80   Wt Readings from Last 3 Encounters:  04/27/23 199 lb 3.2 oz (90.4 kg)  02/16/23 195 lb (88.5 kg)  01/12/23 200 lb (90.7 kg)   SpO2 Readings from Last 3 Encounters:  04/27/23 97%  02/16/23 97%  01/12/23 97%      Physical Exam Vitals and nursing note reviewed.  Constitutional:      General: She is not in acute distress.    Appearance: Normal appearance. She is well-developed.  HENT:     Head: Normocephalic and atraumatic.  Eyes:     General: No scleral icterus.       Right eye: No discharge.        Left eye: No discharge.  Cardiovascular:     Rate and Rhythm: Normal rate and regular rhythm.     Heart sounds: No murmur heard. Pulmonary:     Effort: Pulmonary effort is normal. No respiratory distress.     Breath sounds: Normal breath sounds.  Musculoskeletal:        General: Normal range of motion.     Cervical back: Normal range of motion and neck supple.     Right lower leg: No edema.     Left lower leg: No edema.  Skin:    General: Skin is warm and dry.  Neurological:     Mental Status: She is alert and oriented to person, place, and time.  Psychiatric:        Mood and Affect: Mood normal.        Behavior: Behavior normal.        Thought Content: Thought content  normal.        Judgment: Judgment normal.      Results for orders placed or performed in visit on 04/27/23  POCT Urinalysis Dipstick (Automated)  Result Value Ref Range   Color, UA Yellow    Clarity, UA clear    Glucose, UA Negative Negative   Bilirubin, UA small    Ketones, UA trace    Spec Grav, UA 1.020 1.010 - 1.025   Blood, UA Negative    pH, UA 5.0 5.0 - 8.0   Protein, UA Negative Negative   Urobilinogen, UA 0.2 0.2 or 1.0 E.U./dL   Nitrite, UA Negative    Leukocytes, UA Trace (A) Negative    Last CBC Lab Results  Component  Value Date   WBC 8.0 11/20/2022   HGB 14.0 11/20/2022   HCT 41.4 11/20/2022   MCV 99.2 11/20/2022   MCH 32.5 04/07/2018   RDW 13.8 11/20/2022   PLT 312.0 11/20/2022   Last metabolic panel Lab Results  Component Value Date   GLUCOSE 91 11/20/2022   NA 140 11/20/2022   K 4.8 11/20/2022   CL 105 11/20/2022   CO2 26 11/20/2022   BUN 11 11/20/2022   CREATININE 0.94 11/20/2022   GFR 70.88 11/20/2022   CALCIUM 9.2 11/20/2022   PROT 6.9 11/20/2022   ALBUMIN 4.1 11/20/2022   LABGLOB 2.9 03/06/2022   AGRATIO 1.4 03/06/2022   BILITOT 0.3 11/20/2022   ALKPHOS 59 11/20/2022   AST 14 11/20/2022   ALT 8 11/20/2022   ANIONGAP 10 04/07/2018   Last lipids Lab Results  Component Value Date   CHOL 181 11/20/2022   HDL 57.80 11/20/2022   LDLCALC 106 (H) 11/20/2022   LDLDIRECT 143.7 11/12/2013   TRIG 89.0 11/20/2022   CHOLHDL 3 11/20/2022   Last hemoglobin A1c Lab Results  Component Value Date   HGBA1C 6.1 11/20/2022   Last thyroid functions Lab Results  Component Value Date   TSH 0.94 08/08/2022   T4TOTAL 10.1 04/09/2018   Last vitamin D Lab Results  Component Value Date   VD25OH 30.28 11/20/2022   Last vitamin B12 and Folate Lab Results  Component Value Date   VITAMINB12 346 11/20/2022   FOLATE 9.7 05/31/2010      The 10-year ASCVD risk score (Arnett DK, et al., 2019) is: 3.9%    Assessment & Plan:   Problem List Items Addressed This Visit       Unprioritized   High risk medication use   Relevant Orders   Drug Monitoring Panel [phone] , Urine   Insomnia   Relevant Medications   traZODone (DESYREL) 50 MG tablet   Diabetes mellitus type II, controlled (HCC) - Primary   Relevant Medications   metFORMIN (GLUCOPHAGE) 500 MG tablet   Other Relevant Orders   Comprehensive metabolic panel   Hemoglobin A1c   Microalbumin / creatinine urine ratio   Uncontrolled type 2 diabetes mellitus with hyperglycemia (HCC)    hgba1c to be checked,  minimize simple carbs.  Increase exercise as tolerated. Continue current meds       Relevant Medications   metFORMIN (GLUCOPHAGE) 500 MG tablet   Hyperlipidemia associated with type 2 diabetes mellitus (HCC)    Encourage heart healthy diet such as MIND or DASH diet, increase exercise, avoid trans fats, simple carbohydrates and processed foods, consider a krill or fish or flaxseed oil cap daily.        Relevant Medications   metFORMIN (GLUCOPHAGE) 500 MG tablet   Other  Relevant Orders   Lipid panel   Fibromyalgia    Chronic pain Con't with swimming       Relevant Medications   traZODone (DESYREL) 50 MG tablet   Essential hypertension    Well controlled, no changes to meds. Encouraged heart healthy diet such as the DASH diet and exercise as tolerated.        Relevant Orders   CBC with Differential/Platelet   Chronic pain syndrome    Update contract and uds       Relevant Medications   traZODone (DESYREL) 50 MG tablet   B12 deficiency    Check labs today      Other Visit Diagnoses     Other fatigue       Relevant Orders   Vitamin B12   VITAMIN D 25 Hydroxy (Vit-D Deficiency, Fractures)   OAB (overactive bladder)       Relevant Medications   mirabegron ER (MYRBETRIQ) 50 MG TB24 tablet   Other Relevant Orders   POCT Urinalysis Dipstick (Automated) (Completed)     Assessment and Plan    Menopausal Symptoms: Hot flashes improved with Veozah. Persistent fatigue and sleep disturbances. -Continue Veozah. -Prescribe Trazodone for sleep, starting with half a dose and increasing to a full dose if needed.  Overactive Bladder: Frequent nocturia disrupting sleep. Patient has an appointment with Dr. Brunilda Payor, a urologist, for further evaluation. -Prescribe medication for overactive bladder to see if it improves symptoms. -Check urine to ensure it's clear.  Weight Management: Patient reports difficulty with weight loss, possibly related to decreased energy and exercise. -Consider trial of Metformin  for weight loss.  Fibroids: Patient reports feeling bloated, possibly due to fibroids. Last ultrasound was a year or more ago. -Recommend follow-up with Dr. Langston Masker for possible ultrasound to assess fibroid size.  Follow-up: -Check on effectiveness of new medications and progress with urologist.        Return in about 3 months (around 07/28/2023).    Donato Schultz, DO

## 2023-04-27 NOTE — Assessment & Plan Note (Signed)
Update contract and uds

## 2023-04-27 NOTE — Assessment & Plan Note (Signed)
Chronic pain Con't with swimming

## 2023-04-27 NOTE — Assessment & Plan Note (Signed)
Encourage heart healthy diet such as MIND or DASH diet, increase exercise, avoid trans fats, simple carbohydrates and processed foods, consider a krill or fish or flaxseed oil cap daily.  °

## 2023-04-27 NOTE — Assessment & Plan Note (Signed)
hgba1c to be checked, minimize simple carbs. Increase exercise as tolerated. Continue current meds  

## 2023-04-27 NOTE — Assessment & Plan Note (Signed)
Check labs today.

## 2023-04-28 LAB — URINE CULTURE
MICRO NUMBER:: 15164613
SPECIMEN QUALITY:: ADEQUATE

## 2023-04-29 LAB — DRUG MONITORING PANEL 376104, URINE
Amphetamines: NEGATIVE ng/mL (ref <500)
Barbiturates: NEGATIVE ng/mL (ref <300)
Benzodiazepines: NEGATIVE ng/mL (ref <100)
Cocaine Metabolite: NEGATIVE ng/mL (ref <150)
Codeine: NEGATIVE ng/mL (ref <50)
Desmethyltramadol: NEGATIVE ng/mL (ref <100)
Hydrocodone: 440 ng/mL — ABNORMAL HIGH (ref <50)
Hydromorphone: 414 ng/mL — ABNORMAL HIGH (ref <50)
Morphine: NEGATIVE ng/mL (ref <50)
Norhydrocodone: 1390 ng/mL — ABNORMAL HIGH (ref <50)
Opiates: POSITIVE ng/mL — AB (ref <100)
Oxycodone: NEGATIVE ng/mL (ref <100)
Tramadol: NEGATIVE ng/mL (ref <100)

## 2023-04-29 LAB — DM TEMPLATE

## 2023-05-16 ENCOUNTER — Other Ambulatory Visit: Payer: Self-pay | Admitting: Family Medicine

## 2023-05-16 MED ORDER — HYDROCODONE-ACETAMINOPHEN 10-325 MG PO TABS: 1.0000 | ORAL_TABLET | Freq: Four times a day (QID) | ORAL | 0 refills | Status: DC | PRN

## 2023-05-16 NOTE — Telephone Encounter (Signed)
Prescription Request  05/16/2023  Is this a "Controlled Substance" medicine? Yes  LOV: 04/27/2023  What is the name of the medication or equipment?   HYDROcodone-acetaminophen (NORCO) 10-325 MG tablet [[id number]]   Have you contacted your pharmacy to request a refill? No   Which pharmacy would you like this sent to?   [facility] [address] [place] - [address] [place] [address] Phone: [phone] Fax: [phone]  Patient notified that their request is being sent to the clinical staff for review and that they should receive a response within 2 business days.   Please advise at Mobile [phone] (mobile)

## 2023-05-16 NOTE — Telephone Encounter (Signed)
Requesting: NORCO Contract: 01/02/2022 UDS: 01/02/22 Last OV: 04/27/23 Next OV: N/A Last Refill: 04/18/2023, #60--0 RF Database:   Please advise

## 2023-05-16 NOTE — Addendum Note (Signed)
Addended by: Roxanne Gates on: 05/16/2023 01:14 PM   Modules accepted: Orders

## 2023-05-21 ENCOUNTER — Other Ambulatory Visit: Payer: Self-pay | Admitting: Family Medicine

## 2023-05-21 ENCOUNTER — Telehealth: Payer: Self-pay | Admitting: Family Medicine

## 2023-05-21 DIAGNOSIS — N3281 Overactive bladder: Secondary | ICD-10-CM

## 2023-05-21 MED ORDER — TOLTERODINE TARTRATE ER 2 MG PO CP24: 2.0000 mg | ORAL_CAPSULE | Freq: Every day | ORAL | 2 refills | Status: DC

## 2023-05-21 MED ORDER — MIRABEGRON ER 50 MG PO TB24: 50.0000 mg | ORAL_TABLET | Freq: Every day | ORAL | 2 refills | Status: DC

## 2023-05-21 NOTE — Telephone Encounter (Signed)
Pt states MYRBETRIQ is too expensive and she would like something else.

## 2023-05-22 NOTE — Telephone Encounter (Signed)
Pt called. LDVM

## 2023-06-14 ENCOUNTER — Other Ambulatory Visit: Payer: Self-pay | Admitting: Family Medicine

## 2023-06-14 MED ORDER — HYDROCODONE-ACETAMINOPHEN 10-325 MG PO TABS: 1.0000 | ORAL_TABLET | Freq: Four times a day (QID) | ORAL | 0 refills | Status: DC | PRN

## 2023-06-14 NOTE — Telephone Encounter (Signed)
Requesting: NORCO Contract: 04/27/23 UDS: 04/27/23 Last OV: 04/27/23 Next OV: N/A Last Refill: 05/16/23, #60--0 RF Database:   Please advise

## 2023-06-14 NOTE — Addendum Note (Signed)
Addended by: Roxanne Gates on: 06/14/2023 11:22 AM   Modules accepted: Orders

## 2023-06-14 NOTE — Telephone Encounter (Signed)
Pt requesting refill on Hydrocodone. Please send to [facility] in [place]

## 2023-06-19 ENCOUNTER — Encounter: Payer: Self-pay | Admitting: Family Medicine

## 2023-06-19 ENCOUNTER — Telehealth: Payer: Self-pay | Admitting: Family Medicine

## 2023-06-19 ENCOUNTER — Ambulatory Visit: Payer: Federal, State, Local not specified - PPO | Admitting: Family Medicine

## 2023-06-19 VITALS — BP 138/88 | HR 92 | Temp 98.6°F | Resp 18 | Ht 66.0 in | Wt 201.7 lb

## 2023-06-19 DIAGNOSIS — I10 Essential (primary) hypertension: Secondary | ICD-10-CM

## 2023-06-19 DIAGNOSIS — N3941 Urge incontinence: Secondary | ICD-10-CM | POA: Diagnosis not present

## 2023-06-19 MED ORDER — AMLODIPINE BESYLATE 5 MG PO TABS: 5.0000 mg | ORAL_TABLET | Freq: Every day | ORAL | 2 refills | Status: DC

## 2023-06-19 NOTE — Telephone Encounter (Signed)
Noted  

## 2023-06-19 NOTE — Progress Notes (Signed)
Established Patient Office Visit  Subjective   Patient ID: Emily Maddox, female    DOB: [birth year]  Age: 52 y.o. MRN: [id number]  Chief Complaint  Patient presents with   Fatigue    Lightheadedness     HPI Discussed the use of AI scribe software for clinical note transcription with the patient, who gave verbal consent to proceed.  History of Present Illness   The patient, with a history of high blood pressure and premenopause, presents with lightheadedness and fatigue. She describes a feeling of pressure in her eyes, which is intermittent and not related to sinus pressure. The patient reports significant weight gain of approximately 30-40 pounds, which she believes may be contributing to her high blood pressure. She also reports frequent urination, which is particularly bothersome on days when she feels her bladder is "off the chain." The patient has not been taking prescribed medication for this issue due to cost. She also mentions a cyst that may be affecting her bladder. The patient expresses frustration and a feeling of being at her "wit's end" due to her symptoms.        Review of Systems  Constitutional:  Positive for malaise/fatigue. Negative for chills and fever.  HENT:  Negative for congestion and hearing loss.   Eyes:  Negative for discharge.  Respiratory:  Negative for cough, sputum production and shortness of breath.   Cardiovascular:  Negative for chest pain, palpitations and leg swelling.  Gastrointestinal:  Negative for abdominal pain, blood in stool, constipation, diarrhea, heartburn, nausea and vomiting.  Genitourinary:  Negative for dysuria, frequency, hematuria and urgency.  Musculoskeletal:  Negative for back pain, falls and myalgias.  Skin:  Negative for rash.  Neurological:  Positive for dizziness. Negative for sensory change, loss of consciousness, weakness and headaches.  Endo/Heme/Allergies:  Negative for environmental allergies. Does not bruise/bleed  easily.  Psychiatric/Behavioral:  Negative for depression and suicidal ideas. The patient is not nervous/anxious and does not have insomnia.       Objective:     BP 138/88   Pulse 92   Temp 98.6 F (37 C)   Resp 18   Ht 5\' 6"  (1.676 m)   Wt 201 lb 11.2 oz (91.5 kg)   SpO2 100%   BMI 32.56 kg/m    Physical Exam Vitals and nursing note reviewed.  Constitutional:      General: She is not in acute distress.    Appearance: Normal appearance. She is well-developed.  HENT:     Head: Normocephalic and atraumatic.  Eyes:     General: No scleral icterus.       Right eye: No discharge.        Left eye: No discharge.  Cardiovascular:     Rate and Rhythm: Normal rate and regular rhythm.     Heart sounds: No murmur heard. Pulmonary:     Effort: Pulmonary effort is normal. No respiratory distress.     Breath sounds: Normal breath sounds.  Musculoskeletal:        General: Normal range of motion.     Cervical back: Normal range of motion and neck supple.     Right lower leg: No edema.     Left lower leg: No edema.  Skin:    General: Skin is warm and dry.  Neurological:     Mental Status: She is alert and oriented to person, place, and time.  Psychiatric:        Mood and Affect: Mood normal.  Behavior: Behavior normal.        Thought Content: Thought content normal.        Judgment: Judgment normal.       No results found for any visits on 06/19/23.    The 10-year ASCVD risk score (Arnett DK, et al., 2019) is: 18.9%    Assessment & Plan:   Problem List Items Addressed This Visit   None Visit Diagnoses     Urge incontinence of urine    -  Primary   Relevant Orders   Ambulatory referral to Urogynecology   Hypertension, unspecified type       Relevant Medications   amLODipine (NORVASC) 5 MG tablet     Assessment and Plan    Hypertension Newly diagnosed with symptoms of fatigue and lightheadedness. Recent weight gain of approximately 40 pounds likely  contributing to elevated blood pressure. -Start low-dose antihypertensive medication. -Check blood pressure at home and return for a blood pressure check in 2-3 weeks.  Urinary Incontinence Frequent urination and incontinence, impacting quality of life. Previous prescriptions for Myrbetriq and Indetrol were not filled due to cost. -Referral to Urogynecologist for specialized management. -Contact insurance to inquire about coverage for incontinence medications.  General Fatigue Reports of feeling "spacey" and fatigued. No fever or other systemic symptoms reported. -Consider referral to a holistic health center for vitamin infusions. -Encourage patient to take time off work for rest and recovery.  Weight Gain Significant weight gain of approximately 40 pounds. -Encourage lifestyle modifications including diet and exercise.  Follow-up in 2-3 weeks or sooner if symptoms worsen.        No follow-ups on file.    Donato Schultz, DO

## 2023-06-19 NOTE — Telephone Encounter (Signed)
Pt called for an appt for lightheadedness and fatigue. Transferred to triage.

## 2023-06-20 ENCOUNTER — Other Ambulatory Visit: Payer: Self-pay | Admitting: Family Medicine

## 2023-06-20 DIAGNOSIS — N3941 Urge incontinence: Secondary | ICD-10-CM

## 2023-06-20 MED ORDER — MIRABEGRON ER 50 MG PO TB24: 50.0000 mg | ORAL_TABLET | Freq: Every day | ORAL | 2 refills | Status: DC

## 2023-06-29 ENCOUNTER — Telehealth: Payer: Self-pay | Admitting: Family Medicine

## 2023-06-29 NOTE — Telephone Encounter (Signed)
Pt states she would like to extend her fmla an extra week. She states this is because of the holiday she did not go on leave until 9/3. She would like her return date to be 9/17. She asked if this could be uploaded in [place].

## 2023-07-16 ENCOUNTER — Other Ambulatory Visit: Payer: Self-pay | Admitting: Family Medicine

## 2023-07-16 MED ORDER — HYDROCODONE-ACETAMINOPHEN 10-325 MG PO TABS: 1.0000 | ORAL_TABLET | Freq: Four times a day (QID) | ORAL | 0 refills | Status: DC | PRN

## 2023-07-16 NOTE — Addendum Note (Signed)
Addended by: Roxanne Gates on: 07/16/2023 09:32 AM   Modules accepted: Orders

## 2023-07-16 NOTE — Telephone Encounter (Signed)
Requesting: NORCO Contract: 01/02/22 UDS: 01/02/22 Last OV: 06/19/23 Next OV: n/a Last Refill: 06/14/2023, #60--0 RF Database:   Please advise

## 2023-07-16 NOTE — Telephone Encounter (Signed)
Prescription Request  07/16/2023  Is this a "Controlled Substance" medicine? Yes  LOV: 06/19/2023  What is the name of the medication or equipment?   rosuvastatin (CRESTOR) 40 MG tablet [[id number]]  HYDROcodone-acetaminophen (NORCO) 10-325 MG tablet [[id number]]  Have you contacted your pharmacy to request a refill? No   Which pharmacy would you like this sent to?   [facility] [address], [place] - [address] [place] [address] Phone: [phone] Fax: [phone]  Patient notified that their request is being sent to the clinical staff for review and that they should receive a response within 2 business days.   Please advise at Mobile [phone] (mobile)

## 2023-08-13 ENCOUNTER — Other Ambulatory Visit: Payer: Self-pay | Admitting: Family Medicine

## 2023-08-13 NOTE — Telephone Encounter (Signed)
Requesting: NORCO Contract: 04/27/23 UDS: 04/27/23 Last OV: 06/19/23 Next OV: n/a Last Refill: 07/16/23, #60--0 RF Database:   Please advise

## 2023-08-13 NOTE — Telephone Encounter (Signed)
Prescription Request  08/13/2023  Is this a "Controlled Substance" medicine? Yes  LOV: 06/19/2023  What is the name of the medication or equipment?   HYDROcodone-acetaminophen (NORCO) 10-325 MG tablet [[id number]]  Have you contacted your pharmacy to request a refill? No   Which pharmacy would you like this sent to?   [facility] [address], [place] - [address] [place] [address] Phone: [phone] Fax: [phone]  Patient notified that their request is being sent to the clinical staff for review and that they should receive a response within 2 business days.   Please advise at Mobile [phone] (mobile)

## 2023-08-13 NOTE — Addendum Note (Signed)
Addended by: Roxanne Gates on: 08/13/2023 01:35 PM   Modules accepted: Orders

## 2023-08-14 MED ORDER — HYDROCODONE-ACETAMINOPHEN 10-325 MG PO TABS: 1.0000 | ORAL_TABLET | Freq: Four times a day (QID) | ORAL | 0 refills | Status: DC | PRN

## 2023-09-17 ENCOUNTER — Other Ambulatory Visit: Payer: Self-pay | Admitting: Family Medicine

## 2023-09-17 MED ORDER — HYDROCODONE-ACETAMINOPHEN 10-325 MG PO TABS: 1.0000 | ORAL_TABLET | Freq: Four times a day (QID) | ORAL | 0 refills | Status: DC | PRN

## 2023-09-17 NOTE — Telephone Encounter (Signed)
Medication: HYDROcodone-acetaminophen (NORCO) 10-325 MG tablet   Has the patient contacted their pharmacy? No.   Preferred Pharmacy:   [facility] [address] [place] - [address]   [address], [place] [address]  Phone:  [phone]  Fax:  [phone]

## 2023-09-17 NOTE — Telephone Encounter (Signed)
Requesting: NORCO Contract: 04/27/23 UDS: 05/17/23 Last OV: 06/19/2023 Next OV: 11/05/22 Last Refill: 08/14/23, #60--0 RF Database:   Please advise

## 2023-09-25 ENCOUNTER — Encounter: Payer: Self-pay | Admitting: Family Medicine

## 2023-09-25 ENCOUNTER — Ambulatory Visit: Payer: Federal, State, Local not specified - PPO | Admitting: Family Medicine

## 2023-09-25 VITALS — BP 118/88 | HR 106 | Temp 98.4°F | Resp 18 | Ht 66.0 in | Wt 200.6 lb

## 2023-09-25 DIAGNOSIS — M65942 Unspecified synovitis and tenosynovitis, left hand: Secondary | ICD-10-CM

## 2023-09-25 DIAGNOSIS — M65941 Unspecified synovitis and tenosynovitis, right hand: Secondary | ICD-10-CM

## 2023-09-25 DIAGNOSIS — F418 Other specified anxiety disorders: Secondary | ICD-10-CM | POA: Diagnosis not present

## 2023-09-27 NOTE — Progress Notes (Signed)
Established Patient Office Visit  Subjective   Patient ID: Emily Maddox, female    DOB: [birth year]  Age: 52 y.o. MRN: [id number]  No chief complaint on file.   HPI Discussed the use of AI scribe software for clinical note transcription with the patient, who gave verbal consent to proceed.  History of Present Illness   The patient, with a history of De Quervain's tenosynovitis, presents with severe hand pain that has been worsening. The pain is so intense that it often wakes her up from sleep and causes her to scream. The patient reports that the pain is not constant, but when it flares up, it is unbearable, rating it as a 7-8 out of 10. The patient has been wearing splints, but they provide no relief. The patient has previously undergone a nerve conduction study and received an injection for the condition, but the pain persists and seems to be getting worse. The patient is scheduled to see a hand specialist soon and is considering surgery as the next step.  In addition to the hand pain, the patient is experiencing significant stress and agitation due to her work situation. She describes her job as highly stressful, with long hours and a heavy workload. The patient reports feeling constantly on edge and ready to "go off" at any moment. She expresses a strong desire to leave her job, but is concerned about the financial implications and the potential impact on her health insurance coverage.  The patient also mentions experiencing sleep disturbances, with difficulty staying asleep. She attributes this to the combined effects of her hand pain and work stress. The patient also notes that she has been losing hair and believes that the onset of perimenopause is exacerbating her overall health issues. She expresses a desire to lose weight and return to her previous weight of 175 lbs.  The patient is clearly distressed and frustrated with her current situation. She expresses a sense of desperation and  a feeling of being at the end of her rope. Despite the challenges, the patient is determined to find a solution and is actively exploring different options, including disability retirement and finding a different job.      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   At risk for central sleep apnea 03/06/2022   Uncontrolled type 2 diabetes mellitus with hyperglycemia (HCC) 05/31/2020   Educated about COVID-19 virus infection 03/23/2020   Influenza 11/05/2018   Hyperglycemia 08/29/2018   Essential hypertension 08/29/2018   Myalgia 05/10/2018   DM (diabetes mellitus) type II uncontrolled, periph vascular disorder 05/10/2018   Hyperlipidemia associated with type 2 diabetes mellitus (HCC) 05/10/2018   B12 deficiency 05/10/2018   Chronic pain syndrome 05/10/2018   High risk medication use 05/10/2018   Snoring 03/26/2018   Nocturia more than twice per night 03/26/2018   Insomnia 01/28/2018   Preventative health care 03/06/2017   Fibromyalgia 07/26/2015   Bulging lumbar disc 05/22/2013   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   Hiatal hernia 08/22/2010   Fibroids 09/03/2009   Diabetes mellitus type II, controlled (HCC) 11/27/2008   Hyperlipidemia 11/27/2008   TOBACCO ABUSE 11/25/2008   Lumbar degenerative disc disease 11/25/2008   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  Diabetes mellitus    Hyperlipidemia    Tachycardia    Past Surgical History:  Procedure Laterality Date   BREAST EXCISIONAL BIOPSY Left 2003   Social History   Tobacco Use   Smoking status: Every Day    Current packs/day: 1.00    Average packs/day: 1 pack/day for 19.0 years (19.0 ttl pk-yrs)     Types: Cigarettes   Smokeless tobacco: Never   Tobacco comments:    couldn't tolerate chantix, cut down to 0.5 pack since 11/2013  Vaping Use   Vaping status: Never Used  Substance Use Topics   Alcohol use: No   Drug use: No   Social History   Socioeconomic History   Marital status: Single    Spouse name: Not on file   Number of children: Not on file   Years of education: Not on file   Highest education level: Bachelor's degree (e.g., BA, AB, BS)  Occupational History    Employer: [employer]   Occupation: irs  Tobacco Use   Smoking status: Every Day    Current packs/day: 1.00    Average packs/day: 1 pack/day for 19.0 years (19.0 ttl pk-yrs)    Types: Cigarettes   Smokeless tobacco: Never   Tobacco comments:    couldn't tolerate chantix, cut down to 0.5 pack since 11/2013  Vaping Use   Vaping status: Never Used  Substance and Sexual Activity   Alcohol use: No   Drug use: No   Sexual activity: Yes    Partners: Female  Other Topics Concern   Not on file  Social History Narrative   Lives alone.     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 in business administration   Exercise--no     R handed   Caffeine: 3 c of coffee a day   Social Determinants of Health   Financial Resource Strain: Low Risk  (03/31/2021)   Overall Financial Resource Strain (CARDIA)    Difficulty of Paying Living Expenses: Not hard at all  Food Insecurity: No Food Insecurity (03/31/2021)   Hunger Vital Sign    Worried About Running Out of Food in the Last Year: Never true    Ran Out of Food in the Last Year: Never true  Transportation Needs: No Transportation Needs (03/31/2021)   PRAPARE - Administrator, Civil Service (Medical): No    Lack of Transportation (Non-Medical): No  Physical Activity: Sufficiently Active (03/31/2021)   Exercise Vital Sign    Days of Exercise per Week: 3 days    Minutes of Exercise per Session: 50 min  Stress: Stress Concern Present (03/31/2021)   [employer] of  Occupational Health - Occupational Stress Questionnaire    Feeling of Stress : To some extent  Social Connections: Moderately Isolated (03/31/2021)   Social Connection and Isolation Panel [NHANES]    Frequency of Communication with Friends and Family: More than three times a week    Frequency of Social Gatherings with Friends and Family: More than three times a week    Attends Religious Services: More than 4 times per year    Active Member of [employer] or Organizations: Not on file    Attends Banker Meetings: Never    Marital Status: Never married  Intimate Partner Violence: Not At Risk (03/31/2021)   Humiliation, Afraid, Rape, and Kick questionnaire    Fear of Current or Ex-Partner: No    Emotionally Abused: No    Physically Abused: No    Sexually Abused: No  Family Status  Relation Name Status   Mother  Deceased at age 56       breast CA   Father  Deceased at age 6       lawn mower accident   Sister  Alive   Brother  Alive   MGM  Deceased   MGF  Deceased   PGM  Deceased   PGF  Deceased   Other  (Not Specified)   Other  (Not Specified)  No partnership data on file   Family History  Problem Relation Age of Onset   Cancer Mother 77       breast   Hypertension Mother    Hyperlipidemia Mother    Diabetes Mother    Breast cancer Mother 47   Cancer Father 71       colon   Hypertension Father    Hyperlipidemia Father    Heart disease Father        a fib   Diabetes Father    Diabetes Sister    Breast cancer Other    Colon cancer Other    Allergies  Allergen Reactions   Metoprolol Other (See Comments)    exhaustion   Iodinated Contrast Media    Latex    Shellfish Allergy    Ultram [Tramadol] Palpitations    Review of Systems  Constitutional:  Negative for fever and malaise/fatigue.  HENT:  Negative for congestion.   Eyes:  Negative for blurred vision.  Respiratory:  Negative for shortness of breath.   Cardiovascular:  Negative for chest pain,  palpitations and leg swelling.  Gastrointestinal:  Negative for abdominal pain, blood in stool and nausea.  Genitourinary:  Negative for dysuria and frequency.  Musculoskeletal:  Positive for back pain and joint pain. Negative for falls.  Skin:  Negative for rash.  Neurological:  Positive for tingling and sensory change. Negative for dizziness, loss of consciousness and headaches.  Endo/Heme/Allergies:  Negative for environmental allergies.  Psychiatric/Behavioral:  Positive for depression. The patient is nervous/anxious.       Objective:     BP 118/88 (BP Location: Right Arm, Patient Position: Sitting, Cuff Size: Large)   Pulse (!) 106   Temp 98.4 F (36.9 C) (Oral)   Resp 18   Ht 5\' 6"  (1.676 m)   Wt 200 lb 9.6 oz (91 kg)   SpO2 98%   BMI 32.38 kg/m  BP Readings from Last 3 Encounters:  09/25/23 118/88  06/19/23 138/88  04/27/23 108/80   Wt Readings from Last 3 Encounters:  09/25/23 200 lb 9.6 oz (91 kg)  06/19/23 201 lb 11.2 oz (91.5 kg)  04/27/23 199 lb 3.2 oz (90.4 kg)   SpO2 Readings from Last 3 Encounters:  09/25/23 98%  06/19/23 100%  04/27/23 97%      Physical Exam Vitals and nursing note reviewed.  Constitutional:      General: She is not in acute distress.    Appearance: Normal appearance. She is well-developed.  HENT:     Head: Normocephalic and atraumatic.  Eyes:     General: No scleral icterus.       Right eye: No discharge.        Left eye: No discharge.  Cardiovascular:     Rate and Rhythm: Normal rate and regular rhythm.     Heart sounds: No murmur heard. Pulmonary:     Effort: Pulmonary effort is normal. No respiratory distress.     Breath sounds: Normal breath sounds.  Musculoskeletal:  General: Normal range of motion.     Cervical back: Normal range of motion and neck supple.     Right lower leg: No edema.     Left lower leg: No edema.  Skin:    General: Skin is warm and dry.  Neurological:     Mental Status: She is alert and  oriented to person, place, and time.  Psychiatric:        Mood and Affect: Mood is anxious and depressed. Affect is tearful.        Behavior: Behavior normal.        Thought Content: Thought content normal. Thought content is not paranoid or delusional. Thought content does not include homicidal or suicidal ideation. Thought content does not include homicidal or suicidal plan.        Judgment: Judgment normal.      No results found for any visits on 09/25/23.  Last CBC Lab Results  Component Value Date   WBC 8.3 04/27/2023   HGB 13.7 04/27/2023   HCT 41.0 04/27/2023   MCV 98.3 04/27/2023   MCH 32.5 04/07/2018   RDW 13.6 04/27/2023   PLT 293.0 04/27/2023   Last metabolic panel Lab Results  Component Value Date   GLUCOSE 86 04/27/2023   NA 139 04/27/2023   K 4.5 04/27/2023   CL 104 04/27/2023   CO2 27 04/27/2023   BUN 15 04/27/2023   CREATININE 1.00 04/27/2023   GFR 65.61 04/27/2023   CALCIUM 9.6 04/27/2023   PROT 7.0 04/27/2023   ALBUMIN 4.2 04/27/2023   LABGLOB 2.9 03/06/2022   AGRATIO 1.4 03/06/2022   BILITOT 0.3 04/27/2023   ALKPHOS 56 04/27/2023   AST 13 04/27/2023   ALT 9 04/27/2023   ANIONGAP 10 04/07/2018   Last lipids Lab Results  Component Value Date   CHOL 174 04/27/2023   HDL 53.40 04/27/2023   LDLCALC 105 (H) 04/27/2023   LDLDIRECT 143.7 11/12/2013   TRIG 78.0 04/27/2023   CHOLHDL 3 04/27/2023   Last hemoglobin A1c Lab Results  Component Value Date   HGBA1C 6.3 04/27/2023   Last thyroid functions Lab Results  Component Value Date   TSH 0.94 08/08/2022   T4TOTAL 10.1 04/09/2018   Last vitamin D Lab Results  Component Value Date   VD25OH 33.73 04/27/2023    The 10-year ASCVD risk score (Arnett DK, et al., 2019) is: 11.2%    Assessment & Plan:   Problem List Items Addressed This Visit   None Visit Diagnoses     Tenosynovitis of both hands    -  Primary   Relevant Orders   Ambulatory referral to Hand Surgery   Depression with  anxiety         Assessment and Plan    De Quervain's Tenosynovitis   She experiences chronic right hand pain, severe enough to cause nocturnal awakenings. Previous therapy and x-ray guided injection were ineffective. She is scheduled to see Dr. Larna Daughters, a hand specialist, on December 10th, 2024. We discussed surgical intervention if conservative measures fail, including risks such as infection, nerve damage, and incomplete symptom relief, and benefits like significant pain relief and improved function. Alternatives include continued conservative management with splints and pain management. We will refer her to a hand surgeon, write a note for three months off work, and advise to ice the wrist.  Work-related Stress   She reports significant stress due to work conditions, including long hours, short staffing, and lack of management support, leading to agitation, elevated  blood pressure, and insomnia. We are considering FMLA and possibly disability retirement, emphasizing the importance of addressing both physical and mental health aspects. We recommended time off work to reduce stress and prevent further health deterioration and discussed the potential benefits of counseling. We will write a note for three months off work, discuss the possibility of FMLA, and recommend seeing a counselor for mental health support.  Perimenopause   She has increased stress and difficulty handling problems since the onset of perimenopause, exacerbating symptoms such as mood swings, irritability, and sleep disturbances. We discussed hormone therapy if symptoms worsen, with potential benefits including mood stabilization and improved sleep, and risks like blood clots and breast cancer. We will monitor symptoms and consider hormone therapy if symptoms worsen.  General Health Maintenance   She desires to lose weight with a target of 175 lbs. We discussed the benefits of weight loss including reduced joint stress, improved  cardiovascular health, and overall well-being, recommending a balanced diet and regular exercise. We encourage weight loss through diet and exercise.  Follow-up   She will follow up with Dr. Larna Daughters on December 10th, 2024.        Return in about 3 months (around 12/24/2023), or if symptoms worsen or fail to improve.    Donato Schultz, DO

## 2023-10-02 ENCOUNTER — Encounter
Payer: Federal, State, Local not specified - PPO | Attending: Physical Medicine & Rehabilitation | Admitting: Physical Medicine & Rehabilitation

## 2023-10-02 ENCOUNTER — Encounter: Payer: Self-pay | Admitting: Physical Medicine & Rehabilitation

## 2023-10-02 VITALS — BP 113/84 | HR 79 | Ht 66.0 in | Wt 198.0 lb

## 2023-10-02 DIAGNOSIS — M25532 Pain in left wrist: Secondary | ICD-10-CM | POA: Insufficient documentation

## 2023-10-02 NOTE — Progress Notes (Signed)
Subjective:    Patient ID: Emily Maddox, female    DOB: 04/14/1971, 52 y.o.   MRN: [id number] 52 year old female with primary complaint of left wrist pain.  Pain is mainly around the base of the thumb and the radial aspect of the wrist.  No falls or trauma to the area. Onset was several months ago.  She does not recall any specific injury.  She works for the [employer], [employer] and does a lot of typing and paperwork.  She is right-hand dominant. She underwent ultrasound-guided first extensor compartment of the wrist injection under ultrasound guidance on 01/11/2023 she has short-term relief of a few days but really nothing long-term. She had EMG/NCV on 12/05/2022 showing no electrodiagnostic evidence of carpal tunnel syndrome.  She did have some slowing of ulnar nerve conduction across the elbow however she has not complained of any pain in the little finger or on the ulnar side of the wrist. HPI 52 year old female with history of left wrist de Quervain's tenosynovitis.  She has undergone ultrasound-guided left wrist injection.  This only provided about 2 days relief.  She went through hand therapy without improvements. Still having severe pain Left wrist.  At this pain is bothering her during certain movements throughout the day and wakes her up at night.  Wearing wrist brace at work Takes hydrocodone takes as needed, prescribed by PCP.  Patient states she has not been taking nonsteroidal anti-inflammatories.  Last BUN/creatinine in July 2024 were normal Pain Inventory Average Pain 7 Pain Right Now 5 My pain is sharp, burning, stabbing, tingling, and aching  In the last 24 hours, has pain interfered with the following? General activity 7 Relation with others 7 Enjoyment of life 7 What TIME of day is your pain at its worst? morning , daytime, and evening Sleep (in general) Poor  Pain is worse with: unsure Pain improves with:  . Relief from Meds: 0  Family  History  Problem Relation Age of Onset   Cancer Mother 74       breast   Hypertension Mother    Hyperlipidemia Mother    Diabetes Mother    Breast cancer Mother 76   Cancer Father 43       colon   Hypertension Father    Hyperlipidemia Father    Heart disease Father        a fib   Diabetes Father    Diabetes Sister    Breast cancer Other    Colon cancer Other    Social History   Socioeconomic History   Marital status: Single    Spouse name: Not on file   Number of children: Not on file   Years of education: Not on file   Highest education level: Bachelor's degree (e.g., BA, AB, BS)  Occupational History    Employer: [employer]   Occupation: irs  Tobacco Use   Smoking status: Every Day    Current packs/day: 1.00    Average packs/day: 1 pack/day for 19.0 years (19.0 ttl pk-yrs)    Types: Cigarettes   Smokeless tobacco: Never   Tobacco comments:    couldn't tolerate chantix, cut down to 0.5 pack since 11/2013  Vaping Use   Vaping status: Never Used  Substance and Sexual Activity   Alcohol use: No   Drug use: No   Sexual activity: Yes    Partners: Female  Other Topics Concern   Not on file  Social History Narrative   Lives alone.  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 in business administration   Exercise--no     R handed   Caffeine: 3 c of coffee a day   Social Determinants of Health   Financial Resource Strain: Low Risk  (03/31/2021)   Overall Financial Resource Strain (CARDIA)    Difficulty of Paying Living Expenses: Not hard at all  Food Insecurity: No Food Insecurity (03/31/2021)   Hunger Vital Sign    Worried About Running Out of Food in the Last Year: Never true    Ran Out of Food in the Last Year: Never true  Transportation Needs: No Transportation Needs (03/31/2021)   PRAPARE - Administrator, Civil Service (Medical): No    Lack of Transportation (Non-Medical): No  Physical Activity: Sufficiently Active (03/31/2021)   Exercise Vital Sign    Days of  Exercise per Week: 3 days    Minutes of Exercise per Session: 50 min  Stress: Stress Concern Present (03/31/2021)   [employer] of Occupational Health - Occupational Stress Questionnaire    Feeling of Stress : To some extent  Social Connections: Moderately Isolated (03/31/2021)   Social Connection and Isolation Panel [NHANES]    Frequency of Communication with Friends and Family: More than three times a week    Frequency of Social Gatherings with Friends and Family: More than three times a week    Attends Religious Services: More than 4 times per year    Active Member of Clubs or Organizations: Not on file    Attends Banker Meetings: Never    Marital Status: Never married   Past Surgical History:  Procedure Laterality Date   BREAST EXCISIONAL BIOPSY Left 2003   Past Surgical History:  Procedure Laterality Date   BREAST EXCISIONAL BIOPSY Left 2003   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   Diabetes mellitus    Hyperlipidemia    Tachycardia    BP 113/84   Pulse 79   Ht 5\' 6"  (1.676 m)   Wt 198 lb (89.8 kg)   SpO2 96%   BMI 31.96 kg/m   Opioid Risk Score:   Fall Risk Score:  `1  Depression screen [facility] 2/9     10/02/2023    2:49 PM 09/25/2023    4:11 PM 02/16/2023   12:40 PM 01/11/2023    3:22 PM 12/05/2022    2:56 PM 11/20/2022   10:54 AM 10/20/2022    1:38 PM  Depression screen PHQ 2/9  Decreased Interest 0 3 0 0 0 0 1  Down, Depressed, Hopeless 0 3 0 0 0 1 1  PHQ - 2 Score 0 6 0 0 0 1 2  Altered sleeping  3    3 3   Tired, decreased energy  0    3 3  Change in appetite      0 0  Feeling bad or failure about yourself   2    0 1  Trouble concentrating  3    0 3  Moving slowly or fidgety/restless  2    0 0  Suicidal thoughts  0    0 0  PHQ-9 Score  16    7 12   Difficult doing work/chores  Very difficult    Very difficult Very difficult      Review of Systems   Musculoskeletal:        Left hand pain   All other  systems reviewed and are negative.     Objective:   Physical Exam  Tenderness to palpation over the lateral aspect of the left wrist. Positive Finkelstein's maneuver Negative Tinel's at the wrist.  Sensation intact to light touch bilateral C6-C7-C8 dermatomal distribution. Motor strength is 5/5 bilateral grip There is no evidence of synovitis in the left wrist. No evidence of trigger finger in the left hand.  No carpal metacarpal deformities      Assessment & Plan:   1.  De Quervain's tenosynovitis not responsive to conservative care.  Will make referral to hand surgery either Dr. Merlyn Lot or Dr. Mina Marble.  I will see the patient back on a as needed basis. Patient will follow-up with PCP

## 2023-10-11 ENCOUNTER — Other Ambulatory Visit: Payer: Self-pay | Admitting: Family Medicine

## 2023-10-11 ENCOUNTER — Telehealth: Payer: Self-pay | Admitting: Family Medicine

## 2023-10-11 MED ORDER — HYDROCODONE-ACETAMINOPHEN 10-325 MG PO TABS: 1.0000 | ORAL_TABLET | Freq: Four times a day (QID) | ORAL | 0 refills | Status: DC | PRN

## 2023-10-11 NOTE — Telephone Encounter (Signed)
Pt dropped off documents to be filled out by provider [facility]) - pt stated during her last visit with provider (09-25-2023) pt mentioned about paperwork and that provider was aware. Pt would like to be called when document ready to  pick up at [facility] [phone]. Document put at front office tray under providers name.

## 2023-10-11 NOTE — Telephone Encounter (Signed)
Requesting: NORCO Contract: 04/27/23 UDS: 04/27/23 Last OV: 09/25/23 Next OV: 11/06/23 Last Refill: 09/17/23, #60--0 RF Database:   Please advise

## 2023-10-11 NOTE — Telephone Encounter (Signed)
Pt came in office stating is needing a refill on her meds for HYDROcodone-acetaminophen (NORCO) 10-325 MG tablet sent to [facility] [address] [place] - [id number], [place] [address] Phone: [phone]  Fax: [phone] . Pt stated wanted to make sure she request it before the holidays. Please advise.

## 2023-10-30 NOTE — Telephone Encounter (Signed)
 In the office note you wrote patient out of work for 3 months via work note. Per your note We will write a note for three months off work, discuss the possibility of FMLA, and recommend seeing a counselor for mental health support.  Would you like a virtual visit to complete?

## 2023-10-30 NOTE — Telephone Encounter (Signed)
    Copied from CRM [id number]. Topic: General - Other >> Oct 30, 2023 10:59 AM Annabella DEL wrote: Reason for CRM: FMLA request called on Thursday to follow up but haven't heard back.   Patient called to follow up on Thursday phone call regarding FMLA paperwork. Patient has been pending response since 10/11/23. Please follow up with patient.

## 2023-10-30 NOTE — Telephone Encounter (Signed)
 Please schedule patient for virtual to make sure we have completed FMLA correctly. Thank you

## 2023-10-31 NOTE — Telephone Encounter (Signed)
 Pt already sch for f/u 1/14.

## 2023-11-01 NOTE — Telephone Encounter (Signed)
 Copied from CRM [id number]. Topic: General - Other >> Nov 01, 2023 10:16 AM Denese Killings wrote: Reason for CRM: Patient stated that she will check to see if paperwork is filed out correctly when she come in for appt on 11/06/2023

## 2023-11-05 ENCOUNTER — Other Ambulatory Visit (INDEPENDENT_AMBULATORY_CARE_PROVIDER_SITE_OTHER): Payer: Self-pay

## 2023-11-05 ENCOUNTER — Ambulatory Visit: Payer: Federal, State, Local not specified - PPO | Admitting: Orthopedic Surgery

## 2023-11-05 DIAGNOSIS — M25532 Pain in left wrist: Secondary | ICD-10-CM | POA: Diagnosis not present

## 2023-11-05 DIAGNOSIS — M654 Radial styloid tenosynovitis [de Quervain]: Secondary | ICD-10-CM | POA: Diagnosis not present

## 2023-11-05 NOTE — Progress Notes (Signed)
 Emily Maddox - 53 y.o. female MRN [id number]  Date of birth: [birth year]  Office Visit Note: Visit Date: 11/05/2023 PCP: Antonio Cyndee Jamee JONELLE, DO Referred by: Antonio Cyndee Jamee R, *  Subjective: No chief complaint on file.  HPI: Emily Maddox is a pleasant 53 y.o. female who presents today for evaluation of ongoing left wrist radial sided pain that is been present now for over 8 months.  She did undergo an injection earlier this year to the first extensor compartment which she states did help.  She works on a computer frequently which she thinks may have been contributory symptoms, has since come back on this which is helping her as well.  Denies any numbness or tingling.  She is diabetic, well-controlled recent A1c of 6.3.  Pertinent ROS were reviewed with the patient and found to be negative unless otherwise specified above in HPI.   Visit Reason: left hand/wrist  Duration of symptoms: 8+months Hand dominance: right Occupation: [employer] Diabetic: Yes /6.3 Smoking: Yes Heart/Lung History: none Blood Thinners:  none  Prior Testing/EMG: EMG 12/05/22 Injections (Date): July-Sept? 2024 Treatments: injection Prior Surgery: none  Assessment & Plan: Visit Diagnoses:  1. Pain in left wrist     Plan: Clinically, she is demonstrating signs symptoms consistent with de Quervain's tenosynovitis of the left wrist.  X-rays were reviewed today which do show some moderate thumb CMC arthritis as well.  From a treatment standpoint, we did discuss the conservative treatments available to her which would include bracing, activity modification, anti-inflammatory medication, injection and potential therapy.  We did discuss that there could be a surgical intervention of a first extensor compartment release which would be performed for symptoms refractory to conservative care.  At this juncture, given that her symptoms continue to improve with conservative modalities, we will continue  along this pathway.  She was fitted for a Comfort Cool brace today which is less bulky than her previous one, I feel that she can utilize this more frequently as more practical for use.  Should her symptoms persist, we could also consider cortisone injection to be repeated in the future.  She expressed understanding, will return as needed.  Follow-up: No follow-ups on file.   Meds & Orders: No orders of the defined types were placed in this encounter.   Orders Placed This Encounter  Procedures   XR Wrist Complete Left     Procedures: No procedures performed      Clinical History: No specialty comments available.  She reports that she has been smoking cigarettes. She has a 19 pack-year smoking history. She has never used smokeless tobacco.  Recent Labs    11/20/22 0951 04/27/23 0921 11/06/23 0937  HGBA1C 6.1 6.3 6.7*    Objective:   Vital Signs: There were no vitals taken for this visit.  Physical Exam  Gen: Well-appearing, in no acute distress; non-toxic CV: Regular Rate. Well-perfused. Warm.  Resp: Breathing unlabored on room air; no wheezing. Psych: Fluid speech in conversation; appropriate affect; normal thought process  Ortho Exam General: Patient is well appearing and in no distress. Cervical spine mobility is full in all directions:  Skin and Muscle: No skin changes are apparent to upper extremities.  Muscle bulk and contour normal, no signs of atrophy.     Range of Motion and Palpation Tests: Mobility is full about the elbows with flexion and extension.  Forearm supination and pronation are 85/85 bilaterally.  Wrist flexion/extension is 75/65 right side,  wrist flexion/extension limited secondary to pain left side, approximately 45/45.  Digital flexion and extension are full.    No cords or nodules are palpated.  No triggering is observed.   Finklestein test is positive left side, moderate pain with associated swelling and tenderness.    Neurologic, Vascular,  Motor: Sensation is intact to light touch in the median/radial/ulnar distributions.   Fingers pink and well perfused.  Capillary refill is brisk.     Imaging: X-rays of the left wrist were completed today  Past Medical/Family/Surgical/Social History: Medications & Allergies reviewed per EMR, new medications updated. 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   At risk for central sleep apnea 03/06/2022   Uncontrolled type 2 diabetes mellitus with hyperglycemia (HCC) 05/31/2020   Educated about COVID-19 virus infection 03/23/2020   Influenza 11/05/2018   Hyperglycemia 08/29/2018   Essential hypertension 08/29/2018   Myalgia 05/10/2018   DM (diabetes mellitus) type II uncontrolled, periph vascular disorder 05/10/2018   Hyperlipidemia associated with type 2 diabetes mellitus (HCC) 05/10/2018   B12 deficiency 05/10/2018   Chronic pain syndrome 05/10/2018   High risk medication use 05/10/2018   Snoring 03/26/2018   Nocturia more than twice per night 03/26/2018   Insomnia 01/28/2018   Preventative health care 03/06/2017   Fibromyalgia 07/26/2015   Bulging lumbar disc 05/22/2013   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   Hiatal hernia 08/22/2010   Fibroids 09/03/2009   Diabetes mellitus type II, controlled (HCC) 11/27/2008   Hyperlipidemia 11/27/2008   TOBACCO ABUSE 11/25/2008   Lumbar degenerative disc disease 11/25/2008   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   Diabetes mellitus    Hyperlipidemia    Tachycardia    Family History  Problem Relation Age of Onset   Cancer Mother 4       breast   Hypertension Mother    Hyperlipidemia Mother    Diabetes  Mother    Breast cancer Mother 58   Cancer Father 78       colon   Hypertension Father    Hyperlipidemia Father    Heart disease Father        a fib   Diabetes Father    Diabetes Sister    Breast cancer Other    Colon cancer Other    Past Surgical History:  Procedure Laterality Date   BREAST EXCISIONAL BIOPSY Left 2003   Social History   Occupational History    Employer: [employer]   Occupation: [employer]  Tobacco Use   Smoking status: Every Day    Current packs/day: 1.00    Average packs/day: 1 pack/day for 19.0 years (19.0 ttl pk-yrs)    Types: Cigarettes   Smokeless tobacco: Never   Tobacco comments:    couldn't tolerate chantix, cut down to 0.5 pack since 11/2013  Vaping Use   Vaping status: Never Used  Substance and Sexual Activity   Alcohol use: No   Drug use: No   Sexual activity: Yes    Partners: Female    Jaquille Kau Estela) Treyton Slimp, M.D. [facility]

## 2023-11-06 ENCOUNTER — Encounter: Payer: Self-pay | Admitting: Family Medicine

## 2023-11-06 ENCOUNTER — Ambulatory Visit: Payer: Federal, State, Local not specified - PPO | Admitting: Family Medicine

## 2023-11-06 VITALS — BP 110/80 | HR 111 | Temp 98.6°F | Resp 18 | Ht 66.0 in | Wt 201.4 lb

## 2023-11-06 DIAGNOSIS — E559 Vitamin D deficiency, unspecified: Secondary | ICD-10-CM

## 2023-11-06 DIAGNOSIS — E1165 Type 2 diabetes mellitus with hyperglycemia: Secondary | ICD-10-CM

## 2023-11-06 DIAGNOSIS — Z1211 Encounter for screening for malignant neoplasm of colon: Secondary | ICD-10-CM

## 2023-11-06 DIAGNOSIS — M65942 Unspecified synovitis and tenosynovitis, left hand: Secondary | ICD-10-CM

## 2023-11-06 DIAGNOSIS — G894 Chronic pain syndrome: Secondary | ICD-10-CM | POA: Diagnosis not present

## 2023-11-06 DIAGNOSIS — Z23 Encounter for immunization: Secondary | ICD-10-CM | POA: Diagnosis not present

## 2023-11-06 DIAGNOSIS — E1169 Type 2 diabetes mellitus with other specified complication: Secondary | ICD-10-CM | POA: Diagnosis not present

## 2023-11-06 DIAGNOSIS — E785 Hyperlipidemia, unspecified: Secondary | ICD-10-CM | POA: Diagnosis not present

## 2023-11-06 DIAGNOSIS — M797 Fibromyalgia: Secondary | ICD-10-CM | POA: Diagnosis not present

## 2023-11-06 DIAGNOSIS — M65941 Unspecified synovitis and tenosynovitis, right hand: Secondary | ICD-10-CM | POA: Diagnosis not present

## 2023-11-06 DIAGNOSIS — E538 Deficiency of other specified B group vitamins: Secondary | ICD-10-CM

## 2023-11-06 DIAGNOSIS — I10 Essential (primary) hypertension: Secondary | ICD-10-CM

## 2023-11-06 LAB — CBC WITH DIFFERENTIAL/PLATELET
Basophils Absolute: 0.1 10*3/uL (ref 0.0–0.1)
Basophils Relative: 0.7 % (ref 0.0–3.0)
Eosinophils Absolute: 0.2 10*3/uL (ref 0.0–0.7)
Eosinophils Relative: 2.1 % (ref 0.0–5.0)
HCT: 42 % (ref 36.0–46.0)
Hemoglobin: 13.8 g/dL (ref 12.0–15.0)
Lymphocytes Relative: 27.4 % (ref 12.0–46.0)
Lymphs Abs: 2.5 10*3/uL (ref 0.7–4.0)
MCHC: 32.9 g/dL (ref 30.0–36.0)
MCV: 99.2 fL (ref 78.0–100.0)
Monocytes Absolute: 0.5 10*3/uL (ref 0.1–1.0)
Monocytes Relative: 5 % (ref 3.0–12.0)
Neutro Abs: 5.8 10*3/uL (ref 1.4–7.7)
Neutrophils Relative %: 64.8 % (ref 43.0–77.0)
Platelets: 290 10*3/uL (ref 150.0–400.0)
RBC: 4.24 Mil/uL (ref 3.87–5.11)
RDW: 13.6 % (ref 11.5–15.5)
WBC: 9 10*3/uL (ref 4.0–10.5)

## 2023-11-06 LAB — COMPREHENSIVE METABOLIC PANEL
ALT: 9 U/L (ref 0–35)
AST: 14 U/L (ref 0–37)
Albumin: 4.2 g/dL (ref 3.5–5.2)
Alkaline Phosphatase: 64 U/L (ref 39–117)
BUN: 14 mg/dL (ref 6–23)
CO2: 28 meq/L (ref 19–32)
Calcium: 9.1 mg/dL (ref 8.4–10.5)
Chloride: 105 meq/L (ref 96–112)
Creatinine, Ser: 0.94 mg/dL (ref 0.40–1.20)
GFR: 70.41 mL/min (ref >60.00)
Glucose, Bld: 94 mg/dL (ref 70–99)
Potassium: 4.7 meq/L (ref 3.5–5.1)
Sodium: 141 meq/L (ref 135–145)
Total Bilirubin: 0.3 mg/dL (ref 0.2–1.2)
Total Protein: 6.8 g/dL (ref 6.0–8.3)

## 2023-11-06 LAB — LIPID PANEL
Cholesterol: 172 mg/dL (ref 0–200)
HDL: 59.9 mg/dL (ref >39.00)
LDL Cholesterol: 95 mg/dL (ref 0–99)
NonHDL: 112.08
Total CHOL/HDL Ratio: 3
Triglycerides: 84 mg/dL (ref 0.0–149.0)
VLDL: 16.8 mg/dL (ref 0.0–40.0)

## 2023-11-06 LAB — MICROALBUMIN / CREATININE URINE RATIO
Creatinine,U: 148.1 mg/dL
Microalb Creat Ratio: 0.5 mg/g (ref 0.0–30.0)
Microalb, Ur: <0.7 mg/dL (ref 0.0–1.9)

## 2023-11-06 LAB — VITAMIN D 25 HYDROXY (VIT D DEFICIENCY, FRACTURES): VITD: 21.28 ng/mL — ABNORMAL LOW (ref 30.00–100.00)

## 2023-11-06 LAB — HEMOGLOBIN A1C: Hgb A1c MFr Bld: 6.7 % — ABNORMAL HIGH (ref 4.6–6.5)

## 2023-11-06 LAB — VITAMIN B12: Vitamin B-12: 313 pg/mL (ref 211–911)

## 2023-11-06 MED ORDER — HYDROCODONE-ACETAMINOPHEN 10-325 MG PO TABS: 1.0000 | ORAL_TABLET | Freq: Four times a day (QID) | ORAL | 0 refills | Status: DC | PRN

## 2023-11-06 NOTE — Assessment & Plan Note (Signed)
 Encourage heart healthy diet such as MIND or DASH diet, increase exercise, avoid trans fats, simple carbohydrates and processed foods, consider a krill or fish or flaxseed oil cap daily.

## 2023-11-06 NOTE — Assessment & Plan Note (Signed)
 Well controlled, no changes to meds. Encouraged heart healthy diet such as the DASH diet and exercise as tolerated.

## 2023-11-06 NOTE — Patient Instructions (Signed)
Chronic Pain, Adult Chronic pain is a type of pain that lasts or keeps coming back for at least 3-6 months. You may have headaches, pain in the abdomen, or pain in other areas of the body. Chronic pain may be related to an illness, injury, or a health condition. Sometimes, the cause of chronic pain is not known. Chronic pain can make it hard for you to do daily activities. If it is not treated, chronic pain can lead to anxiety and depression. Treatment depends on the cause of your pain and how severe it is. You may need to work with a pain specialist to come up with a treatment plan. Many people benefit from two or more types of treatment to control their pain. Follow these instructions at home: Treatment plan Follow your treatment plan as told by your health care provider. This may include: Gentle, regular exercise. Eating a healthy diet that includes foods such as vegetables, fruits, fish, and lean meats. Mental health therapy (cognitive or behavioral therapy) that changes the way you think or act in response to the pain. This may help improve how you feel. Doing physical therapy exercises to improve movement and strength. Meditation, yoga, acupuncture, or massage therapy. Using the oils from plants in your environment or on your skin (aromatherapy). Other treatments may include: Over-the-counter or prescription medicines. Color, light, or sound therapy. Local electrical stimulation. The electrical pulses help to relieve pain by temporarily stopping the nerve impulses that cause you to feel pain. Injections. These deliver numbing or pain-relieving medicines into the spine or the area of pain.  Medicines Take over-the-counter and prescription medicines only as told by your health care provider. Ask your health care provider if the medicine prescribed to you: Requires you to avoid driving or using machinery. Can cause constipation. You may need to take these actions to prevent or treat  constipation: Drink enough fluid to keep your urine pale yellow. Take over-the-counter or prescription medicines. Eat foods that are high in fiber, such as beans, whole grains, and fresh fruits and vegetables. Limit foods that are high in fat and processed sugars, such as fried or sweet foods. Lifestyle  Ask your health care provider whether you should keep a pain diary. Your health care provider will tell you what information to write in the diary. This may include: When you have pain. What the pain feels like. How medicines and other behaviors or treatments help to reduce the pain. Consider talking with a mental health care provider about how to help manage chronic pain. Consider joining a chronic pain support group. Try to control or lower your stress levels. Talk with your health care provider about ways to do this. General instructions Learn as much as you can about how to manage your chronic pain. Ask your health care provider if an intensive pain rehabilitation program or a chronic pain specialist would be helpful. Check your pain level as told by your health care provider. Ask your health care provider if you should use a pain scale. Contact a health care provider if: Your pain is not controlled with treatment. You have new pain. You have side effects from pain medicine. You feel weak or you have trouble doing your normal activities. You have trouble sleeping or you develop confusion. You lose feeling or have numbness in your body. You lose control of your bowels or bladder. Get help right away if: Your pain suddenly gets much worse. You develop chest pain. You have trouble breathing or shortness of  breath. You faint, or another person sees you faint. These symptoms may be an emergency. Get help right away. Call 911. Do not wait to see if the symptoms will go away. Do not drive yourself to the hospital. Also, get help right away if: You have thoughts about hurting yourself  or others. Take one of these steps if you feel like you may hurt yourself or others, or have thoughts about taking your own life: Go to your nearest emergency room. Call 911. Call the National Suicide Prevention Lifeline at 613-055-8912 or 988. This is open 24 hours a day. Text the Crisis Text Line at 323-193-1954. This information is not intended to replace advice given to you by your health care provider. Make sure you discuss any questions you have with your health care provider. Document Revised: 05/31/2022 Document Reviewed: 05/03/2022 Elsevier Patient Education  2024 [employer].

## 2023-11-06 NOTE — Progress Notes (Signed)
 Established Patient Office Visit  Subjective   Patient ID: Emily Maddox, female    DOB: Nov 04, 1970  Age: 53 y.o. MRN: [id number]  Chief Complaint  Patient presents with   FMLA    HPI Discussed the use of AI scribe software for clinical note transcription with the patient, who gave verbal consent to proceed.  History of Present Illness   The patient, with a history of arthritis, presented with concerns about her hand. She reported that her hand feels significantly better since she stopped working, suggesting that the inflammation has decreased due to rest. She recently had an x-ray, which showed some arthritis. She was given a new brace for her hand, which provides better thumb support and has improved her comfort. She also reported that her pain, which was previously severe enough to wake her up at night, has decreased significantly.  The patient also reported feeling exhausted and tired, but noted that the past two days have been the best she has had since leaving her job. She also mentioned that she has started exercising again, which she found beneficial.  The patient also reported having an eye infection a month ago, for which she is due for a follow-up exam. She also mentioned that she is due for a mammogram and a Pap smear. She has not been sexually active for approximately thirteen years.  The patient also reported having a fast heart rate, which she believes is situational. She mentioned that her blood pressure is generally low, but can increase when she is agitated. She also reported having received a flu shot at a local [facility] in November.  The patient also mentioned that she is overdue for a tetanus shot and a colonoscopy. She usually has a colonoscopy every five years, but it has been seven years since her last one. She also mentioned that she is due for a pneumonia shot.      Patient Active Problem List   Diagnosis Date Noted   Excessive daytime sleepiness 03/06/2022    Impaired fasting glucose 03/06/2022   Gasping for breath 03/06/2022   Non-restorative sleep 03/06/2022   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   At risk for central sleep apnea 03/06/2022   Uncontrolled type 2 diabetes mellitus with hyperglycemia (HCC) 05/31/2020   Educated about COVID-19 virus infection 03/23/2020   Influenza 11/05/2018   Hyperglycemia 08/29/2018   Essential hypertension 08/29/2018   Myalgia 05/10/2018   DM (diabetes mellitus) type II uncontrolled, periph vascular disorder 05/10/2018   Hyperlipidemia associated with type 2 diabetes mellitus (HCC) 05/10/2018   B12 deficiency 05/10/2018   Chronic pain syndrome 05/10/2018   High risk medication use 05/10/2018   Snoring 03/26/2018   Nocturia more than twice per night 03/26/2018   Insomnia 01/28/2018   Preventative health care 03/06/2017   Fibromyalgia 07/26/2015   Bulging lumbar disc 05/22/2013   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   Hiatal hernia 08/22/2010   Fibroids 09/03/2009   Diabetes mellitus type II, controlled (HCC) 11/27/2008   Hyperlipidemia 11/27/2008   TOBACCO ABUSE 11/25/2008   Lumbar degenerative disc disease 11/25/2008   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   Diabetes mellitus    Hyperlipidemia    Tachycardia    Past Surgical History:  Procedure Laterality Date   BREAST EXCISIONAL BIOPSY Left 2003   Social History   Tobacco Use   Smoking status: Every Day    Current packs/day:  1.00    Average packs/day: 1 pack/day for 19.0 years (19.0 ttl pk-yrs)    Types: Cigarettes   Smokeless tobacco: Never   Tobacco comments:    couldn't tolerate chantix, cut down to 0.5 pack since 11/2013  Vaping Use   Vaping status: Never Used  Substance Use Topics   Alcohol use: No   Drug use: No   Social History   Socioeconomic History   Marital status: Single    Spouse name:  Not on file   Number of children: Not on file   Years of education: Not on file   Highest education level: Bachelor's degree (e.g., BA, AB, BS)  Occupational History    Employer: [employer]   Occupation: [employer]  Tobacco Use   Smoking status: Every Day    Current packs/day: 1.00    Average packs/day: 1 pack/day for 19.0 years (19.0 ttl pk-yrs)    Types: Cigarettes   Smokeless tobacco: Never   Tobacco comments:    couldn't tolerate chantix, cut down to 0.5 pack since 11/2013  Vaping Use   Vaping status: Never Used  Substance and Sexual Activity   Alcohol use: No   Drug use: No   Sexual activity: Yes    Partners: Female  Other Topics Concern   Not on file  Social History Narrative   Lives alone.     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 in business administration   Exercise--no     R handed   Caffeine: 3 c of coffee a day   Social Drivers of Corporate Investment Banker Strain: Low Risk  (03/31/2021)   Overall Financial Resource Strain (CARDIA)    Difficulty of Paying Living Expenses: Not hard at all  Food Insecurity: No Food Insecurity (03/31/2021)   Hunger Vital Sign    Worried About Running Out of Food in the Last Year: Never true    Ran Out of Food in the Last Year: Never true  Transportation Needs: No Transportation Needs (03/31/2021)   PRAPARE - Administrator, Civil Service (Medical): No    Lack of Transportation (Non-Medical): No  Physical Activity: Sufficiently Active (03/31/2021)   Exercise Vital Sign    Days of Exercise per Week: 3 days    Minutes of Exercise per Session: 50 min  Stress: Stress Concern Present (03/31/2021)   [employer] of Occupational Health - Occupational Stress Questionnaire    Feeling of Stress : To some extent  Social Connections: Moderately Isolated (03/31/2021)   Social Connection and Isolation Panel [NHANES]    Frequency of Communication with Friends and Family: More than three times a week    Frequency of Social Gatherings with Friends and  Family: More than three times a week    Attends Religious Services: More than 4 times per year    Active Member of [employer] or Organizations: Not on file    Attends Banker Meetings: Never    Marital Status: Never married  Intimate Partner Violence: Not At Risk (03/31/2021)   Humiliation, Afraid, Rape, and Kick questionnaire    Fear of Current or Ex-Partner: No    Emotionally Abused: No    Physically Abused: No    Sexually Abused: No   Family Status  Relation Name Status   Mother  Deceased at age 81       breast CA   Father  Deceased at age 81       lawn mower accident   Sister  Alive  Brother  Alive   MGM  Deceased   MGF  Deceased   PGM  Deceased   PGF  Deceased   Other  (Not Specified)   Other  (Not Specified)  No partnership data on file   Family History  Problem Relation Age of Onset   Cancer Mother 54       breast   Hypertension Mother    Hyperlipidemia Mother    Diabetes Mother    Breast cancer Mother 41   Cancer Father 78       colon   Hypertension Father    Hyperlipidemia Father    Heart disease Father        a fib   Diabetes Father    Diabetes Sister    Breast cancer Other    Colon cancer Other    Allergies  Allergen Reactions   Metoprolol  Other (See Comments)    exhaustion   Iodinated Contrast Media    Latex    Shellfish Allergy    Ultram  [Tramadol ] Palpitations      Review of Systems  Constitutional:  Positive for malaise/fatigue. Negative for chills and fever.  HENT:  Negative for congestion and hearing loss.   Eyes:  Negative for discharge.  Respiratory:  Negative for cough, sputum production and shortness of breath.   Cardiovascular:  Negative for chest pain, palpitations and leg swelling.  Gastrointestinal:  Negative for abdominal pain, blood in stool, constipation, diarrhea, heartburn, nausea and vomiting.  Genitourinary:  Negative for dysuria, frequency, hematuria and urgency.  Musculoskeletal:  Positive for back pain and  myalgias. Negative for falls.  Skin:  Negative for rash.  Neurological:  Negative for dizziness, sensory change, loss of consciousness, weakness and headaches.  Endo/Heme/Allergies:  Negative for environmental allergies. Does not bruise/bleed easily.  Psychiatric/Behavioral:  Negative for depression and suicidal ideas. The patient is not nervous/anxious and does not have insomnia.       Objective:     BP 110/80 (BP Location: Left Arm, Patient Position: Sitting, Cuff Size: Large)   Pulse (!) 111   Temp 98.6 F (37 C) (Oral)   Resp 18   Ht 5' 6 (1.676 m)   Wt 201 lb 6.4 oz (91.4 kg)   SpO2 99%   BMI 32.51 kg/m  BP Readings from Last 3 Encounters:  11/06/23 110/80  10/02/23 113/84  09/25/23 118/88   Wt Readings from Last 3 Encounters:  11/06/23 201 lb 6.4 oz (91.4 kg)  10/02/23 198 lb (89.8 kg)  09/25/23 200 lb 9.6 oz (91 kg)   SpO2 Readings from Last 3 Encounters:  11/06/23 99%  10/02/23 96%  09/25/23 98%      Physical Exam Vitals and nursing note reviewed.  Constitutional:      General: She is not in acute distress.    Appearance: Normal appearance. She is well-developed.  HENT:     Head: Normocephalic and atraumatic.  Eyes:     General: No scleral icterus.       Right eye: No discharge.        Left eye: No discharge.  Cardiovascular:     Rate and Rhythm: Normal rate and regular rhythm.     Heart sounds: No murmur heard. Pulmonary:     Effort: Pulmonary effort is normal. No respiratory distress.     Breath sounds: Normal breath sounds.  Musculoskeletal:        General: Normal range of motion.     Cervical back: Normal range of motion and  neck supple.     Right lower leg: No edema.     Left lower leg: No edema.  Skin:    General: Skin is warm and dry.  Neurological:     Mental Status: She is alert and oriented to person, place, and time.  Psychiatric:        Mood and Affect: Mood normal.        Behavior: Behavior normal.        Thought Content:  Thought content normal.        Judgment: Judgment normal.      No results found for any visits on 11/06/23.  Last CBC Lab Results  Component Value Date   WBC 8.3 04/27/2023   HGB 13.7 04/27/2023   HCT 41.0 04/27/2023   MCV 98.3 04/27/2023   MCH 32.5 04/07/2018   RDW 13.6 04/27/2023   PLT 293.0 04/27/2023   Last metabolic panel Lab Results  Component Value Date   GLUCOSE 86 04/27/2023   NA 139 04/27/2023   K 4.5 04/27/2023   CL 104 04/27/2023   CO2 27 04/27/2023   BUN 15 04/27/2023   CREATININE 1.00 04/27/2023   GFR 65.61 04/27/2023   CALCIUM  9.6 04/27/2023   PROT 7.0 04/27/2023   ALBUMIN 4.2 04/27/2023   LABGLOB 2.9 03/06/2022   AGRATIO 1.4 03/06/2022   BILITOT 0.3 04/27/2023   ALKPHOS 56 04/27/2023   AST 13 04/27/2023   ALT 9 04/27/2023   ANIONGAP 10 04/07/2018   Last lipids Lab Results  Component Value Date   CHOL 174 04/27/2023   HDL 53.40 04/27/2023   LDLCALC 105 (H) 04/27/2023   LDLDIRECT 143.7 11/12/2013   TRIG 78.0 04/27/2023   CHOLHDL 3 04/27/2023   Last hemoglobin A1c Lab Results  Component Value Date   HGBA1C 6.3 04/27/2023   Last thyroid  functions Lab Results  Component Value Date   TSH 0.94 08/08/2022   T4TOTAL 10.1 04/09/2018   Last vitamin D  Lab Results  Component Value Date   VD25OH 33.73 04/27/2023   Last vitamin B12 and Folate Lab Results  Component Value Date   VITAMINB12 278 04/27/2023   FOLATE 9.7 05/31/2010      The 10-year ASCVD risk score (Arnett DK, et al., 2019) is: 8.6%    Assessment & Plan:   Problem List Items Addressed This Visit       Unprioritized   Hyperlipidemia associated with type 2 diabetes mellitus (HCC)   Relevant Orders   Lipid panel   CBC with Differential/Platelet   Comprehensive metabolic panel   Fibromyalgia   Relevant Medications   HYDROcodone -acetaminophen  (NORCO) 10-325 MG tablet   Chronic pain syndrome   Relevant Medications   HYDROcodone -acetaminophen  (NORCO) 10-325 MG tablet    B12 deficiency   Relevant Orders   Vitamin B12   Hyperlipidemia   Encourage heart healthy diet such as MIND or DASH diet, increase exercise, avoid trans fats, simple carbohydrates and processed foods, consider a krill or fish or flaxseed oil cap daily.        Essential hypertension   Well controlled, no changes to meds. Encouraged heart healthy diet such as the DASH diet and exercise as tolerated.        Other Visit Diagnoses       Tenosynovitis of both hands    -  Primary   Relevant Medications   HYDROcodone -acetaminophen  (NORCO) 10-325 MG tablet     Colon cancer screening       Relevant Orders   Ambulatory  referral to Gastroenterology     Need for tetanus booster       Relevant Orders   Tdap vaccine greater than or equal to 7yo IM (Completed)     Need for pneumococcal 20-valent conjugate vaccination       Relevant Orders   Pneumococcal conjugate vaccine 20-valent (Prevnar 20) (Completed)     Hypertension, unspecified type         Type 2 diabetes mellitus with hyperglycemia, without long-term current use of insulin (HCC)       Relevant Orders   Comprehensive metabolic panel   Hemoglobin A1c   Microalbumin / creatinine urine ratio     Vitamin D  deficiency       Relevant Orders   VITAMIN D  25 Hydroxy (Vit-D Deficiency, Fractures)     Assessment and Plan    Hand Arthritis Chronic hand arthritis recently exacerbated but symptoms have significantly improved with rest and a new brace. A previous x-ray confirmed mild arthritis. An orthopedic specialist advised against another injection due to current improvement. Non-surgical management is preferred. Continue using the new brace for thumb support and avoid activities that exacerbate symptoms. Follow up with the orthopedic specialist as needed.  Knee Sprain A recent fall at home resulted in a knee sprain with no severe injury reported. Conservative management includes rest, ice, and elevation. Consider physical therapy if  symptoms persist. Rest and avoid activities that strain the knee, apply ice, and elevate the knee as needed.  Tachycardia Intermittent tachycardia with a heart rate of 111 bpm was noted during the visit. There is a family history of tachycardia and situational hypertension. No current medication for tachycardia due to low blood pressure. Monitor heart rate and blood pressure, and consider a cardiology referral if symptoms persist or worsen.  Diabetes Mellitus Blood sugar control is good with occasional elevations after consuming sugary foods. Emphasized the importance of maintaining a balanced diet and regular exercise to manage blood sugar levels. Continue monitoring blood sugar levels and maintain a balanced diet and regular exercise.  General Health Maintenance Several routine health maintenance screenings and vaccinations are overdue. Due for mammogram, Pap smear, tetanus, and pneumonia vaccinations. A flu shot was received in November 2024. Discussed the importance of these vaccinations and screenings for overall health maintenance. Administer tetanus and pneumonia vaccines, order a mammogram, schedule a Pap smear, and recommend a shingles vaccine in 1-2 weeks. Order routine blood work including vitamins and minerals. Encourage regular exercise and a healthy lifestyle.  Follow-up Follow up with the primary care provider for routine check-ups and schedule follow-up appointments for any new or worsening symptoms.        Return in about 3 months (around 02/04/2024), or if symptoms worsen or fail to improve.    Gorman Safi R Lowne Chase, DO

## 2023-11-07 ENCOUNTER — Encounter: Payer: Self-pay | Admitting: Family Medicine

## 2023-11-08 NOTE — Telephone Encounter (Signed)
Printed and placed in your office

## 2023-11-11 ENCOUNTER — Encounter: Payer: Self-pay | Admitting: Family Medicine

## 2023-11-12 ENCOUNTER — Other Ambulatory Visit: Payer: Self-pay

## 2023-11-12 MED ORDER — VITAMIN D (ERGOCALCIFEROL) 1.25 MG (50000 UNIT) PO CAPS: 50000.0000 [IU] | ORAL_CAPSULE | ORAL | 1 refills | Status: DC

## 2023-11-13 ENCOUNTER — Telehealth: Payer: Self-pay | Admitting: Family Medicine

## 2023-11-13 NOTE — Telephone Encounter (Signed)
Copied from CRM [id number]. Topic: General - Other >> Nov 13, 2023  3:17 PM Larwance Sachs wrote: Reason for CRM: Patient asking for a status update on paperwork being completed, please return call to patient at [phone]

## 2023-11-13 NOTE — Telephone Encounter (Signed)
See mychart message from 1/15 please

## 2023-11-16 NOTE — Telephone Encounter (Signed)
Patient is wanting her forms sent over Email address [email or web address]

## 2023-12-05 ENCOUNTER — Other Ambulatory Visit: Payer: Self-pay | Admitting: Family Medicine

## 2023-12-05 DIAGNOSIS — G894 Chronic pain syndrome: Secondary | ICD-10-CM

## 2023-12-05 DIAGNOSIS — M797 Fibromyalgia: Secondary | ICD-10-CM

## 2023-12-05 DIAGNOSIS — M65942 Unspecified synovitis and tenosynovitis, left hand: Secondary | ICD-10-CM

## 2023-12-05 NOTE — Telephone Encounter (Signed)
Last Fill: 11/06/23 60 tabs/0 refills  Last OV: 11/06/23 Next OV: None Scheduled  Routing to provider for review/authorization.

## 2023-12-05 NOTE — Telephone Encounter (Unsigned)
Copied from CRM [id number]. Topic: Clinical - Medication Refill >> Dec 05, 2023  4:02 PM Maxwell Marion wrote: Most Recent Primary Care Visit:  Provider: [name] R  Department: [facility]  Visit Type: OFFICE VISIT  Date: 11/06/2023  Medication: HYDROcodone-acetaminophen (NORCO) 10-325 MG tablet  Has the patient contacted their pharmacy? Pt has to contact the doctor for a refill (Agent: If no, request that the patient contact the pharmacy for the refill. If patient does not wish to contact the pharmacy document the reason why and proceed with request.) (Agent: If yes, when and what did the pharmacy advise?)  Is this the correct pharmacy for this prescription? Yes If no, delete pharmacy and type the correct one.  This is the patient's preferred pharmacy:  [facility] [address], [place] - [address] [place] [address] [place] [address] Phone: [phone] Fax: [phone]  [facility] [address] [place] [address] Phone: [phone] Fax: [phone]   Has the prescription been filled recently? No  Is the patient out of the medication? No  Has the patient been seen for an appointment in the last year OR does the patient have an upcoming appointment? Yes  Can we respond through MyChart? Yes  Agent: Please be advised that Rx refills may take up to 3 business days. We ask that you follow-up with your pharmacy.

## 2023-12-06 MED ORDER — HYDROCODONE-ACETAMINOPHEN 10-325 MG PO TABS
1.0000 | ORAL_TABLET | Freq: Four times a day (QID) | ORAL | 0 refills | Status: DC | PRN
Start: 2023-12-06 — End: 2024-01-02

## 2023-12-06 NOTE — Telephone Encounter (Signed)
Requesting: hydrocodone 10-325mg   Contract: 04/27/23 UDS: 04/27/23 Last Visit: 11/06/23 Next Visit: None Last Refill: 11/06/23 #60 and 0RF   Please Advise

## 2023-12-20 ENCOUNTER — Telehealth: Payer: Self-pay | Admitting: Family Medicine

## 2023-12-20 NOTE — Telephone Encounter (Signed)
 Copied from CRM [id number]. Topic: Medical Record Request - Records Request >> Dec 20, 2023  4:29 PM Eunice Blase wrote: Reason for CRM: Pt called request date when first became pt. Please call pt at [phone].

## 2023-12-21 NOTE — Telephone Encounter (Signed)
 Pt made aware

## 2024-01-02 ENCOUNTER — Other Ambulatory Visit: Payer: Self-pay | Admitting: Family Medicine

## 2024-01-02 DIAGNOSIS — M797 Fibromyalgia: Secondary | ICD-10-CM

## 2024-01-02 DIAGNOSIS — M65942 Unspecified synovitis and tenosynovitis, left hand: Secondary | ICD-10-CM

## 2024-01-02 DIAGNOSIS — G894 Chronic pain syndrome: Secondary | ICD-10-CM

## 2024-01-02 MED ORDER — HYDROCODONE-ACETAMINOPHEN 10-325 MG PO TABS
1.0000 | ORAL_TABLET | Freq: Four times a day (QID) | ORAL | 0 refills | Status: DC | PRN
Start: 2024-01-02 — End: 2024-01-30

## 2024-01-02 NOTE — Telephone Encounter (Signed)
 Copied from CRM [id number]. Topic: Clinical - Medication Refill >> Jan 02, 2024  8:49 AM Aletta Edouard wrote: Most Recent Primary Care Visit:  Provider: [name] R  Department: [facility]  Visit Type: OFFICE VISIT  Date: 11/06/2023  Medication: HYDROcodone-acetaminophen (NORCO) 10-325 MG tablet  Has the patient contacted their pharmacy? Yes (Agent: If no, request that the patient contact the pharmacy for the refill. If patient does not wish to contact the pharmacy document the reason why and proceed with request.) (Agent: If yes, when and what did the pharmacy advise?)  Is this the correct pharmacy for this prescription? Yes If no, delete pharmacy and type the correct one.  This is the patient's preferred pharmacy:  [facility] [address] [place] - [address] [place] [address] [place] [address] Phone: [phone] Fax: [phone]   Has the prescription been filled recently? No  Is the patient out of the medication? Yes  Has the patient been seen for an appointment in the last year OR does the patient have an upcoming appointment? Yes  Can we respond through MyChart? No  Agent: Please be advised that Rx refills may take up to 3 business days. We ask that you follow-up with your pharmacy.         Patient also would like a call back regarding her test results as well

## 2024-01-02 NOTE — Telephone Encounter (Signed)
 Requesting: Norco 10-325 Contract: 04/27/2023 UDS: 05/17/2023 Last Visit: 11/06/2023 Next Visit: N/A Last Refill: 12/06/2023  Please Advise

## 2024-01-08 ENCOUNTER — Telehealth: Payer: Self-pay

## 2024-01-08 NOTE — Telephone Encounter (Signed)
 Received records request from [facility] [employer] [facility] [employer] [facility] [employer] for patient records from 10/23/2020 to present. Request faxed to medical records.

## 2024-01-14 ENCOUNTER — Telehealth: Payer: Self-pay | Admitting: Physical Medicine & Rehabilitation

## 2024-01-14 ENCOUNTER — Telehealth: Payer: Self-pay | Admitting: Family Medicine

## 2024-01-14 NOTE — Telephone Encounter (Signed)
 Copied from CRM [id number]. Topic: Medical Record Request - Other >> Jan 14, 2024  1:39 PM Deaijah H wrote: Reason for CRM: [employer] called in stating she sent a disability form physicians statement response and would like to know if it was received. Reached out to CAL was advised nursed left for the day and provider is out for the week. Would appreciate if someone could give call to Callback number [phone] to confirm

## 2024-01-14 NOTE — Telephone Encounter (Signed)
 [place] [employer]- federal [employer] called and stated disability form physician statement was faxed on the 14th and needs a call to verify we received it

## 2024-01-15 NOTE — Telephone Encounter (Signed)
 Returned call. Forms have been received. Made Sarah aware that Laury Axon is out of the office until next week.

## 2024-01-25 ENCOUNTER — Ambulatory Visit (INDEPENDENT_AMBULATORY_CARE_PROVIDER_SITE_OTHER): Admitting: Family Medicine

## 2024-01-25 VITALS — Temp 97.8°F | Resp 18 | Ht 66.0 in | Wt 202.4 lb

## 2024-01-25 DIAGNOSIS — I10 Essential (primary) hypertension: Secondary | ICD-10-CM

## 2024-01-25 DIAGNOSIS — E1165 Type 2 diabetes mellitus with hyperglycemia: Secondary | ICD-10-CM | POA: Diagnosis not present

## 2024-01-25 DIAGNOSIS — E785 Hyperlipidemia, unspecified: Secondary | ICD-10-CM

## 2024-01-25 DIAGNOSIS — E538 Deficiency of other specified B group vitamins: Secondary | ICD-10-CM

## 2024-01-25 DIAGNOSIS — E559 Vitamin D deficiency, unspecified: Secondary | ICD-10-CM | POA: Diagnosis not present

## 2024-01-25 DIAGNOSIS — E1169 Type 2 diabetes mellitus with other specified complication: Secondary | ICD-10-CM

## 2024-01-25 DIAGNOSIS — G894 Chronic pain syndrome: Secondary | ICD-10-CM | POA: Diagnosis not present

## 2024-01-25 DIAGNOSIS — R110 Nausea: Secondary | ICD-10-CM

## 2024-01-25 DIAGNOSIS — R0683 Snoring: Secondary | ICD-10-CM

## 2024-01-25 DIAGNOSIS — M797 Fibromyalgia: Secondary | ICD-10-CM

## 2024-01-25 DIAGNOSIS — G4700 Insomnia, unspecified: Secondary | ICD-10-CM

## 2024-01-25 LAB — CBC WITH DIFFERENTIAL/PLATELET
Basophils Absolute: 0.1 10*3/uL (ref 0.0–0.1)
Basophils Relative: 0.8 % (ref 0.0–3.0)
Eosinophils Absolute: 0.1 10*3/uL (ref 0.0–0.7)
Eosinophils Relative: 1.6 % (ref 0.0–5.0)
HCT: 42.3 % (ref 36.0–46.0)
Hemoglobin: 14.2 g/dL (ref 12.0–15.0)
Lymphocytes Relative: 32.1 % (ref 12.0–46.0)
Lymphs Abs: 2.4 10*3/uL (ref 0.7–4.0)
MCHC: 33.5 g/dL (ref 30.0–36.0)
MCV: 99.1 fl (ref 78.0–100.0)
Monocytes Absolute: 0.5 10*3/uL (ref 0.1–1.0)
Monocytes Relative: 6.3 % (ref 3.0–12.0)
Neutro Abs: 4.5 10*3/uL (ref 1.4–7.7)
Neutrophils Relative %: 59.2 % (ref 43.0–77.0)
Platelets: 288 10*3/uL (ref 150.0–400.0)
RBC: 4.27 Mil/uL (ref 3.87–5.11)
RDW: 13.3 % (ref 11.5–15.5)
WBC: 7.5 10*3/uL (ref 4.0–10.5)

## 2024-01-25 LAB — COMPREHENSIVE METABOLIC PANEL WITH GFR
ALT: 9 U/L (ref 0–35)
AST: 15 U/L (ref 0–37)
Albumin: 4.6 g/dL (ref 3.5–5.2)
Alkaline Phosphatase: 69 U/L (ref 39–117)
BUN: 12 mg/dL (ref 6–23)
CO2: 27 meq/L (ref 19–32)
Calcium: 9.5 mg/dL (ref 8.4–10.5)
Chloride: 104 meq/L (ref 96–112)
Creatinine, Ser: 0.97 mg/dL (ref 0.40–1.20)
GFR: 67.7 mL/min (ref >60.00)
Glucose, Bld: 79 mg/dL (ref 70–99)
Potassium: 4.5 meq/L (ref 3.5–5.1)
Sodium: 141 meq/L (ref 135–145)
Total Bilirubin: 0.3 mg/dL (ref 0.2–1.2)
Total Protein: 7.4 g/dL (ref 6.0–8.3)

## 2024-01-25 LAB — LIPID PANEL
Cholesterol: 177 mg/dL (ref 0–200)
HDL: 57.4 mg/dL (ref >39.00)
LDL Cholesterol: 105 mg/dL — ABNORMAL HIGH (ref 0–99)
NonHDL: 119.8
Total CHOL/HDL Ratio: 3
Triglycerides: 76 mg/dL (ref 0.0–149.0)
VLDL: 15.2 mg/dL (ref 0.0–40.0)

## 2024-01-25 LAB — HEMOGLOBIN A1C: Hgb A1c MFr Bld: 6.5 % (ref 4.6–6.5)

## 2024-01-25 LAB — MICROALBUMIN / CREATININE URINE RATIO
Creatinine,U: 176.2 mg/dL
Microalb Creat Ratio: UNDETERMINED mg/g (ref 0.0–30.0)
Microalb, Ur: <0.7 mg/dL

## 2024-01-25 LAB — VITAMIN D 25 HYDROXY (VIT D DEFICIENCY, FRACTURES): VITD: 38.33 ng/mL (ref 30.00–100.00)

## 2024-01-25 LAB — VITAMIN B12: Vitamin B-12: 331 pg/mL (ref 211–911)

## 2024-01-25 MED ORDER — ONDANSETRON HCL 8 MG PO TABS: 8.0000 mg | ORAL_TABLET | Freq: Three times a day (TID) | ORAL | 0 refills | Status: DC | PRN

## 2024-01-25 MED ORDER — TIRZEPATIDE 2.5 MG/0.5ML ~~LOC~~ SOAJ: 2.5000 mg | SUBCUTANEOUS | 0 refills | Status: DC

## 2024-01-25 NOTE — Progress Notes (Signed)
 Established Patient Office Visit  Subjective   Patient ID: Emily Maddox, female    DOB: [birth year]  Age: 53 y.o. MRN: [id number]  Chief Complaint  Patient presents with   retirement    Concerns/ questions: 1. fatigue, pain. 2. Pt would like to restart the Mounjaro     HPI Discussed the use of AI scribe software for clinical note transcription with the patient, who gave verbal consent to proceed.  History of Present Illness Emily Maddox is a 53 year old female with fibromyalgia and chronic pain who presents for disability paperwork and management of her symptoms.  She has been experiencing chronic pain primarily due to fibromyalgia for over 25 years, with the pain being particularly severe in the mornings. This affects her ability to maintain a regular schedule. She has undergone various treatments including pain management, physical therapy, orthopedic consultations, and behavioral health interventions. Despite these efforts, she continues to experience significant pain, especially in her neck and shoulders. She describes a cycle of feeling decent for a week or two, followed by a return of symptoms. Chronic pain and fatigue have impacted her ability to work in a fast-paced environment, leading to exhaustion and agitation.  She has gained weight since 2020, which she attributes to perimenopause and her inability to exercise due to exhaustion. She has a history of significant weight loss, having lost 80 pounds and maintained it for over 20 years, but has struggled with weight gain since starting full-time work. She is concerned about her current weight and its impact on her health. She previously considered retrying Mounjaro  for its potential benefits on addiction but  stopped due to gastrointestinal side effects.  She experiences frequent nocturnal awakenings with a full bladder, occurring at least four times a week. She underwent a sleep study two years ago, which did not indicate sleep apnea, but her nephew has noted occasional snoring. She has gained weight since the study, raising concerns about potential changes in her sleep patterns.  She smokes cigarettes, which may contribute to her overall health issues, including potential sleep apnea. She reports waking up exhausted and consuming large amounts of coffee and cigarettes to stay functional.  She is in the process of retiring on disability due to her medical conditions and is concerned about maintaining health insurance coverage. She is currently taking vitamin B12 and vitamin D  supplements but not a multivitamin.   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   At risk for central sleep apnea 03/06/2022   Type 2 diabetes mellitus with hyperglycemia, without long-term current  use of insulin (HCC) 05/31/2020   Educated about COVID-19 virus infection 03/23/2020   Influenza 11/05/2018   Hyperglycemia 08/29/2018   Essential hypertension 08/29/2018   Myalgia 05/10/2018   DM (diabetes mellitus) type II uncontrolled, periph vascular disorder 05/10/2018   Hyperlipidemia associated with type 2 diabetes mellitus (HCC) 05/10/2018   B12 deficiency 05/10/2018   Chronic pain syndrome 05/10/2018   High risk medication use 05/10/2018   Snoring 03/26/2018   Nocturia more than twice per night 03/26/2018   Insomnia 01/28/2018   Preventative health care 03/06/2017   Fibromyalgia 07/26/2015   Bulging  lumbar disc 05/22/2013   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   Hiatal hernia 08/22/2010   Fibroids 09/03/2009   Diabetes mellitus type II, controlled (HCC) 11/27/2008   Hyperlipidemia 11/27/2008   TOBACCO ABUSE 11/25/2008   Lumbar degenerative disc disease 11/25/2008   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   Diabetes mellitus    Hyperlipidemia    Tachycardia    Past Surgical History:  Procedure Laterality Date   BREAST EXCISIONAL BIOPSY Left 2003   Social History   Tobacco Use   Smoking status: Every Day    Current packs/day: 1.00    Average packs/day: 1 pack/day for 19.0 years (19.0 ttl pk-yrs)    Types: Cigarettes   Smokeless tobacco: Never   Tobacco comments:    couldn't tolerate chantix, cut down to 0.5 pack since 11/2013  Vaping Use   Vaping status: Never Used  Substance Use Topics   Alcohol use: No   Drug use: No   Social History   Socioeconomic History   Marital status: Single    Spouse name: Not on file   Number of children: Not on file   Years of education: Not on file   Highest education level: Bachelor's degree (e.g., BA, AB, BS)  Occupational History    Employer: [employer]   Occupation: [employer]  Tobacco Use   Smoking status: Every Day    Current packs/day: 1.00    Average packs/day: 1 pack/day for 19.0 years (19.0 ttl pk-yrs)    Types: Cigarettes   Smokeless tobacco: Never   Tobacco comments:    couldn't tolerate chantix, cut down to 0.5 pack since 11/2013  Vaping Use   Vaping status: Never Used  Substance and Sexual Activity   Alcohol use: No   Drug use: No   Sexual activity: Yes    Partners: Female  Other Topics Concern   Not on file  Social History Narrative   Lives alone.     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 in business administration   Exercise--no     R handed   Caffeine: 3 c of coffee a day   Social Drivers of Corporate investment banker Strain:  Low Risk  (03/31/2021)   Overall Financial Resource Strain (CARDIA)    Difficulty of Paying Living Expenses: Not hard at all  Food Insecurity: No Food Insecurity (03/31/2021)   Hunger Vital Sign    Worried About Running Out of Food in the Last Year: Never true    Ran Out of Food in the Last Year: Never true  Transportation Needs: No Transportation Needs (03/31/2021)   PRAPARE - Administrator, Civil Service (Medical): No    Lack of Transportation (Non-Medical): No  Physical Activity: Sufficiently Active (03/31/2021)   Exercise Vital Sign    Days of Exercise per Week: 3  days    Minutes of Exercise per Session: 50 min  Stress: Stress Concern Present (03/31/2021)   [employer] of Occupational Health - Occupational Stress Questionnaire    Feeling of Stress : To some extent  Social Connections: Moderately Isolated (03/31/2021)   Social Connection and Isolation Panel [NHANES]    Frequency of Communication with Friends and Family: More than three times a week    Frequency of Social Gatherings with Friends and Family: More than three times a week    Attends Religious Services: More than 4 times per year    Active Member of [employer] or Organizations: Not on file    Attends Banker Meetings: Never    Marital Status: Never married  Intimate Partner Violence: Not At Risk (03/31/2021)   Humiliation, Afraid, Rape, and Kick questionnaire    Fear of Current or Ex-Partner: No    Emotionally Abused: No    Physically Abused: No    Sexually Abused: No   Family Status  Relation Name Status   Mother  Deceased at age 54       breast CA   Father  Deceased at age 37       lawn mower accident   Sister  Alive   Brother  Alive   MGM  Deceased   MGF  Deceased   PGM  Deceased   PGF  Deceased   Other  (Not Specified)   Other  (Not Specified)  No partnership data on file   Family History  Problem Relation Age of Onset   Cancer Mother 66       breast   Hypertension Mother     Hyperlipidemia Mother    Diabetes Mother    Breast cancer Mother 66   Cancer Father 38       colon   Hypertension Father    Hyperlipidemia Father    Heart disease Father        a fib   Diabetes Father    Diabetes Sister    Breast cancer Other    Colon cancer Other    Allergies  Allergen Reactions   Metoprolol  Other (See Comments)    exhaustion   Iodinated Contrast Media    Latex    Shellfish Allergy    Ultram  [Tramadol ] Palpitations      Review of Systems  Constitutional:  Negative for fever and malaise/fatigue.  HENT:  Negative for congestion.   Eyes:  Negative for blurred vision.  Respiratory:  Negative for cough and shortness of breath.   Cardiovascular:  Negative for chest pain, palpitations and leg swelling.  Gastrointestinal:  Negative for vomiting.  Musculoskeletal:  Negative for back pain.  Skin:  Negative for rash.  Neurological:  Negative for loss of consciousness and headaches.      Objective:     Temp 97.8 F (36.6 C) (Temporal)   Resp 18   Ht 5\' 6"  (1.676 m)   Wt 202 lb 6.4 oz (91.8 kg)   SpO2 96%   BMI 32.67 kg/m  BP Readings from Last 3 Encounters:  11/06/23 110/80  10/02/23 113/84  09/25/23 118/88   Wt Readings from Last 3 Encounters:  01/25/24 202 lb 6.4 oz (91.8 kg)  11/06/23 201 lb 6.4 oz (91.4 kg)  10/02/23 198 lb (89.8 kg)   SpO2 Readings from Last 3 Encounters:  01/25/24 96%  11/06/23 99%  10/02/23 96%      Physical Exam Vitals and nursing note reviewed.  Constitutional:  General: She is not in acute distress.    Appearance: Normal appearance. She is well-developed.  HENT:     Head: Normocephalic and atraumatic.  Eyes:     General: No scleral icterus.       Right eye: No discharge.        Left eye: No discharge.  Cardiovascular:     Rate and Rhythm: Normal rate and regular rhythm.     Heart sounds: No murmur heard. Pulmonary:     Effort: Pulmonary effort is normal. No respiratory distress.     Breath sounds:  Normal breath sounds.  Musculoskeletal:        General: Normal range of motion.     Cervical back: Normal range of motion and neck supple.     Right lower leg: No edema.     Left lower leg: No edema.  Skin:    General: Skin is warm and dry.  Neurological:     Mental Status: She is alert and oriented to person, place, and time.  Psychiatric:        Mood and Affect: Mood normal.        Behavior: Behavior normal.        Thought Content: Thought content normal.        Judgment: Judgment normal.      Results for orders placed or performed in visit on 01/25/24  Lipid panel  Result Value Ref Range   Cholesterol 177 0 - 200 mg/dL   Triglycerides 16.1 0.0 - 149.0 mg/dL   HDL 09.60 >45.40 mg/dL   VLDL 98.1 0.0 - 19.1 mg/dL   LDL Cholesterol 478 (H) 0 - 99 mg/dL   Total CHOL/HDL Ratio 3    NonHDL 119.80   CBC with Differential/Platelet  Result Value Ref Range   WBC 7.5 4.0 - 10.5 K/uL   RBC 4.27 3.87 - 5.11 Mil/uL   Hemoglobin 14.2 12.0 - 15.0 g/dL   HCT 29.5 62.1 - 30.8 %   MCV 99.1 78.0 - 100.0 fl   MCHC 33.5 30.0 - 36.0 g/dL   RDW 65.7 84.6 - 96.2 %   Platelets 288.0 150.0 - 400.0 K/uL   Neutrophils Relative % 59.2 43.0 - 77.0 %   Lymphocytes Relative 32.1 12.0 - 46.0 %   Monocytes Relative 6.3 3.0 - 12.0 %   Eosinophils Relative 1.6 0.0 - 5.0 %   Basophils Relative 0.8 0.0 - 3.0 %   Neutro Abs 4.5 1.4 - 7.7 K/uL   Lymphs Abs 2.4 0.7 - 4.0 K/uL   Monocytes Absolute 0.5 0.1 - 1.0 K/uL   Eosinophils Absolute 0.1 0.0 - 0.7 K/uL   Basophils Absolute 0.1 0.0 - 0.1 K/uL  Comprehensive metabolic panel with GFR  Result Value Ref Range   Sodium 141 135 - 145 mEq/L   Potassium 4.5 3.5 - 5.1 mEq/L   Chloride 104 96 - 112 mEq/L   CO2 27 19 - 32 mEq/L   Glucose, Bld 79 70 - 99 mg/dL   BUN 12 6 - 23 mg/dL   Creatinine, Ser 9.52 0.40 - 1.20 mg/dL   Total Bilirubin 0.3 0.2 - 1.2 mg/dL   Alkaline Phosphatase 69 39 - 117 U/L   AST 15 0 - 37 U/L   ALT 9 0 - 35 U/L   Total Protein  7.4 6.0 - 8.3 g/dL   Albumin 4.6 3.5 - 5.2 g/dL   GFR 84.13 >24.40 mL/min   Calcium  9.5 8.4 - 10.5 mg/dL  Hemoglobin N0U  Result Value Ref Range   Hgb A1c MFr Bld 6.5 4.6 - 6.5 %  Microalbumin / creatinine urine ratio  Result Value Ref Range   Microalb, Ur <0.7 mg/dL   Creatinine,U 161.0 mg/dL   Microalb Creat Ratio Unable to calculate 0.0 - 30.0 mg/g  Vitamin B12  Result Value Ref Range   Vitamin B-12 331 211 - 911 pg/mL  VITAMIN D  25 Hydroxy (Vit-D Deficiency, Fractures)  Result Value Ref Range   VITD 38.33 30.00 - 100.00 ng/mL  Thyroid  Panel With TSH  Result Value Ref Range   T3 Uptake 29 22 - 35 %   T4, Total 8.2 5.1 - 11.9 mcg/dL   Free Thyroxine Index 2.4 1.4 - 3.8   TSH 0.83 mIU/L  Epstein-Barr virus VCA antibody panel  Result Value Ref Range   EBV VCA IgM <36.00 U/mL   EBV VCA IgG >750.00 (H) U/mL   EBV NA IgG 379.00 (H) U/mL   Interpretation      Last CBC Lab Results  Component Value Date   WBC 7.5 01/25/2024   HGB 14.2 01/25/2024   HCT 42.3 01/25/2024   MCV 99.1 01/25/2024   MCH 32.5 04/07/2018   RDW 13.3 01/25/2024   PLT 288.0 01/25/2024   Last metabolic panel Lab Results  Component Value Date   GLUCOSE 79 01/25/2024   NA 141 01/25/2024   K 4.5 01/25/2024   CL 104 01/25/2024   CO2 27 01/25/2024   BUN 12 01/25/2024   CREATININE 0.97 01/25/2024   GFR 67.70 01/25/2024   CALCIUM  9.5 01/25/2024   PROT 7.4 01/25/2024   ALBUMIN 4.6 01/25/2024   LABGLOB 2.9 03/06/2022   AGRATIO 1.4 03/06/2022   BILITOT 0.3 01/25/2024   ALKPHOS 69 01/25/2024   AST 15 01/25/2024   ALT 9 01/25/2024   ANIONGAP 10 04/07/2018   Last lipids Lab Results  Component Value Date   CHOL 177 01/25/2024   HDL 57.40 01/25/2024   LDLCALC 105 (H) 01/25/2024   LDLDIRECT 143.7 11/12/2013   TRIG 76.0 01/25/2024   CHOLHDL 3 01/25/2024   Last hemoglobin A1c Lab Results  Component Value Date   HGBA1C 6.5 01/25/2024   Last thyroid  functions Lab Results  Component Value Date    TSH 0.83 01/25/2024   T4TOTAL 8.2 01/25/2024   Last vitamin D  Lab Results  Component Value Date   VD25OH 38.33 01/25/2024   Last vitamin B12 and Folate Lab Results  Component Value Date   VITAMINB12 331 01/25/2024   FOLATE 9.7 05/31/2010      The 10-year ASCVD risk score (Arnett DK, et al., 2019) is: 8%    Assessment & Plan:   Problem List Items Addressed This Visit       Unprioritized   Snoring   Relevant Orders   Ambulatory referral to Neurology   Insomnia   Relevant Orders   Ambulatory referral to Neurology   Epstein-Barr virus VCA antibody panel (Completed)   Type 2 diabetes mellitus with hyperglycemia, without long-term current use of insulin (HCC) - Primary   Relevant Medications   tirzepatide  (MOUNJARO ) 2.5 MG/0.5ML Pen   Other Relevant Orders   Lipid panel (Completed)   Comprehensive metabolic panel with GFR (Completed)   Hemoglobin A1c (Completed)   Microalbumin / creatinine urine ratio (Completed)   Hyperlipidemia associated with type 2 diabetes mellitus (HCC)   Relevant Medications   tirzepatide  (MOUNJARO ) 2.5 MG/0.5ML Pen   Other Relevant Orders   Lipid panel (Completed)   Hemoglobin A1c (Completed)  Microalbumin / creatinine urine ratio (Completed)   Fibromyalgia   Relevant Orders   Thyroid  Panel With TSH (Completed)   Epstein-Barr virus VCA antibody panel (Completed)   Chronic pain syndrome   B12 deficiency   Relevant Orders   Vitamin B12 (Completed)   Essential hypertension   Relevant Orders   Lipid panel (Completed)   CBC with Differential/Platelet (Completed)   Comprehensive metabolic panel with GFR (Completed)   Hemoglobin A1c (Completed)   Microalbumin / creatinine urine ratio (Completed)   Vitamin D  deficiency   Relevant Orders   VITAMIN D  25 Hydroxy (Vit-D Deficiency, Fractures) (Completed)   Nausea   Relevant Medications   ondansetron  (ZOFRAN ) 8 MG tablet   Morbid obesity (HCC)   Relevant Medications   tirzepatide   (MOUNJARO ) 2.5 MG/0.5ML Pen  Assessment and Plan Assessment & Plan Fibromyalgia   She has experienced chronic widespread musculoskeletal pain, fatigue, and tenderness for over 25 years, significantly affecting her ability to maintain a full-time work schedule. Despite previous pain management, physical therapy, orthopedic consultations, and behavioral health support, there has been no significant improvement. Continue pain management and physical therapy. Consider behavioral health support for psychological symptoms. Complete disability paperwork for fibromyalgia.  Obesity   Her weight now exceeds her normal weight of 170 lbs, with difficulty losing weight due to exhaustion and inability to exercise. She previously lost 80 lbs and maintained it for over 20 years. Recent weight gain is attributed to exhaustion and possibly perimenopause. Prescribe Mounjaro  for weight management, despite previous gastrointestinal side effects, and provide anti-nausea medication to manage these. Encourage a gradual increase in physical activity as tolerated.  Possible Sleep Apnea   She reports nocturia, which may indicate sleep apnea. A sleep study two years ago was negative, but recent weight gain could exacerbate or cause sleep apnea. Discussed the relationship between sleep apnea and nocturia, suggesting a repeat sleep study is warranted. Refer to [facility] for a repeat sleep study.  Smoking Cessation   Cigarette smoking may contribute to her exhaustion and potential sleep apnea. She expresses interest in quitting and mentions Mounjaro  may help with addiction. Encourage smoking cessation and discuss the potential benefits of Mounjaro  in aiding smoking cessation.  General Health Maintenance   She is not currently taking a multivitamin and has not scheduled a colonoscopy. She takes vitamin B12 and D supplements. Recommend starting a multivitamin and scheduling a colonoscopy. Order lab work.    Return in about  3 months (around 04/25/2024).    Larin Weissberg R Lowne Chase, DO

## 2024-01-26 ENCOUNTER — Encounter: Payer: Self-pay | Admitting: Family Medicine

## 2024-01-26 DIAGNOSIS — R110 Nausea: Secondary | ICD-10-CM | POA: Insufficient documentation

## 2024-01-26 DIAGNOSIS — E559 Vitamin D deficiency, unspecified: Secondary | ICD-10-CM | POA: Insufficient documentation

## 2024-01-26 LAB — THYROID PANEL WITH TSH
Free Thyroxine Index: 2.4 (ref 1.4–3.8)
T3 Uptake: 29 % (ref 22–35)
T4, Total: 8.2 ug/dL (ref 5.1–11.9)
TSH: 0.83 m[IU]/L

## 2024-01-26 LAB — EPSTEIN-BARR VIRUS VCA ANTIBODY PANEL
EBV NA IgG: 379 U/mL — ABNORMAL HIGH
EBV VCA IgG: >750 U/mL — ABNORMAL HIGH
EBV VCA IgM: <36 U/mL

## 2024-01-26 NOTE — Patient Instructions (Signed)
Chronic Pain, Adult Chronic pain is a type of pain that lasts or keeps coming back for at least 3-6 months. You may have headaches, pain in the abdomen, or pain in other areas of the body. Chronic pain may be related to an illness, injury, or a health condition. Sometimes, the cause of chronic pain is not known. Chronic pain can make it hard for you to do daily activities. If it is not treated, chronic pain can lead to anxiety and depression. Treatment depends on the cause of your pain and how severe it is. You may need to work with a pain specialist to come up with a treatment plan. Many people benefit from two or more types of treatment to control their pain. Follow these instructions at home: Treatment plan Follow your treatment plan as told by your health care provider. This may include: Gentle, regular exercise. Eating a healthy diet that includes foods such as vegetables, fruits, fish, and lean meats. Mental health therapy (cognitive or behavioral therapy) that changes the way you think or act in response to the pain. This may help improve how you feel. Doing physical therapy exercises to improve movement and strength. Meditation, yoga, acupuncture, or massage therapy. Using the oils from plants in your environment or on your skin (aromatherapy). Other treatments may include: Over-the-counter or prescription medicines. Color, light, or sound therapy. Local electrical stimulation. The electrical pulses help to relieve pain by temporarily stopping the nerve impulses that cause you to feel pain. Injections. These deliver numbing or pain-relieving medicines into the spine or the area of pain.  Medicines Take over-the-counter and prescription medicines only as told by your health care provider. Ask your health care provider if the medicine prescribed to you: Requires you to avoid driving or using machinery. Can cause constipation. You may need to take these actions to prevent or treat  constipation: Drink enough fluid to keep your urine pale yellow. Take over-the-counter or prescription medicines. Eat foods that are high in fiber, such as beans, whole grains, and fresh fruits and vegetables. Limit foods that are high in fat and processed sugars, such as fried or sweet foods. Lifestyle  Ask your health care provider whether you should keep a pain diary. Your health care provider will tell you what information to write in the diary. This may include: When you have pain. What the pain feels like. How medicines and other behaviors or treatments help to reduce the pain. Consider talking with a mental health care provider about how to help manage chronic pain. Consider joining a chronic pain support group. Try to control or lower your stress levels. Talk with your health care provider about ways to do this. General instructions Learn as much as you can about how to manage your chronic pain. Ask your health care provider if an intensive pain rehabilitation program or a chronic pain specialist would be helpful. Check your pain level as told by your health care provider. Ask your health care provider if you should use a pain scale. Contact a health care provider if: Your pain is not controlled with treatment. You have new pain. You have side effects from pain medicine. You feel weak or you have trouble doing your normal activities. You have trouble sleeping or you develop confusion. You lose feeling or have numbness in your body. You lose control of your bowels or bladder. Get help right away if: Your pain suddenly gets much worse. You develop chest pain. You have trouble breathing or shortness of  breath. You faint, or another person sees you faint. These symptoms may be an emergency. Get help right away. Call 911. Do not wait to see if the symptoms will go away. Do not drive yourself to the hospital. Also, get help right away if: You have thoughts about hurting yourself  or others. Take one of these steps if you feel like you may hurt yourself or others, or have thoughts about taking your own life: Go to your nearest emergency room. Call 911. Call the National Suicide Prevention Lifeline at 613-055-8912 or 988. This is open 24 hours a day. Text the Crisis Text Line at 323-193-1954. This information is not intended to replace advice given to you by your health care provider. Make sure you discuss any questions you have with your health care provider. Document Revised: 05/31/2022 Document Reviewed: 05/03/2022 Elsevier Patient Education  2024 [employer].

## 2024-01-28 ENCOUNTER — Other Ambulatory Visit (HOSPITAL_COMMUNITY): Payer: Self-pay

## 2024-01-28 ENCOUNTER — Telehealth: Payer: Self-pay | Admitting: Pharmacy Technician

## 2024-01-28 NOTE — Telephone Encounter (Signed)
 Pharmacy Patient Advocate Encounter  Received notification from [facility] that Prior Authorization for Mounjaro 2.5MG /0.5ML auto-injectors has been APPROVED from 01/28/24 to 01/27/25. Ran test claim, Copay is $35.00. This test claim was processed through [facility]- copay amounts may vary at other pharmacies due to pharmacy/plan contracts, or as the patient moves through the different stages of their insurance plan.   PA #/Case ID/Reference #: [id number]

## 2024-01-28 NOTE — Telephone Encounter (Signed)
 Pharmacy Patient Advocate Encounter   Received notification from CoverMyMeds that prior authorization for Mounjaro 2.5MG /0.5ML auto-injectors  is required/requested.   Insurance verification completed.   The patient is insured through [facility] .   Per test claim: [id number]  SUBMITTED AND PENDING

## 2024-01-30 ENCOUNTER — Other Ambulatory Visit: Payer: Self-pay | Admitting: Family Medicine

## 2024-01-30 DIAGNOSIS — M797 Fibromyalgia: Secondary | ICD-10-CM

## 2024-01-30 DIAGNOSIS — M65941 Unspecified synovitis and tenosynovitis, right hand: Secondary | ICD-10-CM

## 2024-01-30 DIAGNOSIS — G894 Chronic pain syndrome: Secondary | ICD-10-CM

## 2024-01-30 MED ORDER — HYDROCODONE-ACETAMINOPHEN 10-325 MG PO TABS
1.0000 | ORAL_TABLET | Freq: Four times a day (QID) | ORAL | 0 refills | Status: DC | PRN
Start: 2024-01-30 — End: 2024-02-27

## 2024-01-30 NOTE — Telephone Encounter (Signed)
 Requesting: Norco 10-325 mg Contract: 04/27/2023 UDS: 04/27/2023 Last Visit: 01/25/2024 Next Visit: N/A Last Refill: 01/02/2024  Please Advise

## 2024-02-24 ENCOUNTER — Other Ambulatory Visit: Payer: Self-pay | Admitting: Family Medicine

## 2024-02-24 DIAGNOSIS — E1165 Type 2 diabetes mellitus with hyperglycemia: Secondary | ICD-10-CM

## 2024-02-27 ENCOUNTER — Other Ambulatory Visit: Payer: Self-pay | Admitting: Family Medicine

## 2024-02-27 ENCOUNTER — Other Ambulatory Visit: Payer: Self-pay

## 2024-02-27 ENCOUNTER — Telehealth: Payer: Self-pay

## 2024-02-27 DIAGNOSIS — E1165 Type 2 diabetes mellitus with hyperglycemia: Secondary | ICD-10-CM

## 2024-02-27 DIAGNOSIS — G894 Chronic pain syndrome: Secondary | ICD-10-CM

## 2024-02-27 DIAGNOSIS — M65942 Unspecified synovitis and tenosynovitis, left hand: Secondary | ICD-10-CM

## 2024-02-27 DIAGNOSIS — M797 Fibromyalgia: Secondary | ICD-10-CM

## 2024-02-27 MED ORDER — TIRZEPATIDE 2.5 MG/0.5ML ~~LOC~~ SOAJ: 2.5000 mg | SUBCUTANEOUS | 0 refills | Status: DC

## 2024-02-27 NOTE — Telephone Encounter (Signed)
 Requesting: hydrocodone  10-325mg  Contract: 04/27/23 UDS:  04/27/23 Last Visit: 01/25/24 Next Visit: None Last Refill: 01/30/24 #60 and 0RF   Please Advise

## 2024-02-27 NOTE — Telephone Encounter (Signed)
 Rx sent. Pt made aware.

## 2024-02-27 NOTE — Telephone Encounter (Signed)
 Copied from CRM [id number]. Topic: Clinical - Medication Refill >> Feb 27, 2024  9:05 AM [place] E wrote: Medication: HYDROcodone -acetaminophen  (NORCO) 10-325 MG tablet  Has the patient contacted their pharmacy? No (Agent: If no, request that the patient contact the pharmacy for the refill. If patient does not wish to contact the pharmacy document the reason why and proceed with request.) (Agent: If yes, when and what did the pharmacy advise?)  This is the patient's preferred pharmacy:  [id number] - [place], [place] - [id number] [place] [address] Phone: [phone] Fax: [phone]   Is this the correct pharmacy for this prescription? Yes If no, delete pharmacy and type the correct one.   Has the prescription been filled recently? Yes  Is the patient out of the medication? No, one day left.  Has the patient been seen for an appointment in the last year OR does the patient have an upcoming appointment? Yes  Can we respond through MyChart? Yes  Agent: Please be advised that Rx refills may take up to 3 business days. We ask that you follow-up with your pharmacy.

## 2024-02-27 NOTE — Telephone Encounter (Signed)
 Copied from CRM [id number]. Topic: Clinical - Prescription Issue >> Feb 27, 2024  9:07 AM [place] E wrote: Reason for CRM: Patient called in regarding her tirzepatide  (MOUNJARO ) 2.5 MG/0.5ML Pen. She received a message saying this medication was denied and not sure as to why. Callback number for patient is [phone] to discuss.

## 2024-02-29 MED ORDER — HYDROCODONE-ACETAMINOPHEN 10-325 MG PO TABS: 1.0000 | ORAL_TABLET | Freq: Four times a day (QID) | ORAL | 0 refills | Status: DC | PRN

## 2024-03-18 ENCOUNTER — Encounter: Payer: Self-pay | Admitting: Family Medicine

## 2024-03-18 ENCOUNTER — Ambulatory Visit: Admitting: Family Medicine

## 2024-03-18 VITALS — BP 128/96 | HR 88 | Temp 98.1°F | Resp 18 | Ht 66.0 in | Wt 203.6 lb

## 2024-03-18 DIAGNOSIS — E1165 Type 2 diabetes mellitus with hyperglycemia: Secondary | ICD-10-CM | POA: Diagnosis not present

## 2024-03-18 DIAGNOSIS — I10 Essential (primary) hypertension: Secondary | ICD-10-CM

## 2024-03-18 DIAGNOSIS — Z79899 Other long term (current) drug therapy: Secondary | ICD-10-CM | POA: Diagnosis not present

## 2024-03-18 DIAGNOSIS — G894 Chronic pain syndrome: Secondary | ICD-10-CM

## 2024-03-18 DIAGNOSIS — M797 Fibromyalgia: Secondary | ICD-10-CM | POA: Diagnosis not present

## 2024-03-18 DIAGNOSIS — E559 Vitamin D deficiency, unspecified: Secondary | ICD-10-CM

## 2024-03-18 DIAGNOSIS — R0683 Snoring: Secondary | ICD-10-CM

## 2024-03-18 DIAGNOSIS — E538 Deficiency of other specified B group vitamins: Secondary | ICD-10-CM

## 2024-03-18 DIAGNOSIS — E1169 Type 2 diabetes mellitus with other specified complication: Secondary | ICD-10-CM

## 2024-03-18 DIAGNOSIS — E785 Hyperlipidemia, unspecified: Secondary | ICD-10-CM

## 2024-03-18 NOTE — Assessment & Plan Note (Signed)
 Update contract and uds Database reviewed

## 2024-03-18 NOTE — Assessment & Plan Note (Signed)
 Well controlled, no changes to meds. Encouraged heart healthy diet such as the DASH diet and exercise as tolerated.

## 2024-03-18 NOTE — Assessment & Plan Note (Signed)
 With chronic pain

## 2024-03-18 NOTE — Patient Instructions (Signed)
Myofascial Pain Syndrome and Fibromyalgia Myofascial pain syndrome and fibromyalgia are both pain disorders. You may feel this pain mainly in your muscles. Myofascial pain syndrome: Always has tender points in the muscles that will cause pain when pressed (trigger points). The pain may come and go. Usually affects your neck, upper back, and shoulder areas. The pain often moves into your arms and hands. Fibromyalgia: Has muscle pains and tenderness that come and go. Is often associated with tiredness (fatigue) and sleep problems. Has trigger points. Tends to be long-lasting (chronic), but is not life-threatening. Fibromyalgia and myofascial pain syndrome are not the same. However, they often occur together. If you have both conditions, each can make the other worse. Both are common and can cause enough pain and fatigue to make day-to-day activities difficult. Both can be hard to diagnose because their symptoms are common in many other conditions. What are the causes? The exact causes of these conditions are not known. What increases the risk? You are more likely to develop either of these conditions if: You have a family history of the condition. You are female. You have certain triggers, such as: Spine disorders. An injury (trauma) or other physical stressors. Being under a lot of stress. Medical conditions such as osteoarthritis, rheumatoid arthritis, or lupus. What are the signs or symptoms? Fibromyalgia The main symptom of fibromyalgia is widespread pain and tenderness in your muscles. Pain is sometimes described as stabbing, shooting, or burning. You may also have: Tingling or numbness. Sleep problems and fatigue. Problems with attention and concentration (fibro fog). Other symptoms may include: Bowel and bladder problems. Headaches. Vision problems. Sensitivity to odors and noises. Depression or mood changes. Painful menstrual periods (dysmenorrhea). Dry skin or eyes. These  symptoms can vary over time. Myofascial pain syndrome Symptoms of myofascial pain syndrome include: Tight, ropy bands of muscle. Uncomfortable sensations in muscle areas. These may include aching, cramping, burning, numbness, tingling, and weakness. Difficulty moving certain parts of the body freely (poor range of motion). How is this diagnosed? This condition may be diagnosed by your symptoms and medical history. You will also have a physical exam. In general: Fibromyalgia is diagnosed if you have pain, fatigue, and other symptoms for more than 3 months, and symptoms cannot be explained by another condition. Myofascial pain syndrome is diagnosed if you have trigger points in your muscles, and those trigger points are tender and cause pain elsewhere in your body (referred pain). How is this treated? Treatment for these conditions depends on the type that you have. For fibromyalgia, a healthy lifestyle is the most important treatment including aerobic and strength exercises. Different types of medicines are used to help treat pain and include: NSAIDs. Medicines for treating depression. Medicines that help control seizures. Medicines that relax the muscles. Treatment for myofascial pain syndrome includes: Pain medicines, such as NSAIDs. Cooling and stretching of muscles. Massage therapy with myofascial release technique. Trigger point injections. Treating these conditions often requires a team of health care providers. These may include: Your primary care provider. A physical therapist. Complementary health care providers, such as massage therapists or acupuncturists. A psychiatrist for cognitive behavioral therapy. Follow these instructions at home: Medicines Take over-the-counter and prescription medicines only as told by your health care provider. Ask your health care provider if the medicine prescribed to you: Requires you to avoid driving or using machinery. Can cause constipation.  You may need to take these actions to prevent or treat constipation: Drink enough fluid to keep your urine pale   yellow. Take over-the-counter or prescription medicines. Eat foods that are high in fiber, such as beans, whole grains, and fresh fruits and vegetables. Limit foods that are high in fat and processed sugars, such as fried or sweet foods. Lifestyle  Do exercises as told by your health care provider or physical therapist. Practice relaxation techniques to control your stress. You may want to try: Biofeedback. Visual imagery. Hypnosis. Muscle relaxation. Yoga. Meditation. Maintain a healthy lifestyle. This includes eating a healthy diet and getting enough sleep. Do not use any products that contain nicotine or tobacco. These products include cigarettes, chewing tobacco, and vaping devices, such as e-cigarettes. If you need help quitting, ask your health care provider. General instructions Talk to your health care provider about complementary treatments, such as acupuncture or massage. Do not do activities that stress or strain your muscles. This includes repetitive motions and heavy lifting. Keep all follow-up visits. This is important. Where to find support Consider joining a support group with others who are diagnosed with this condition. National Fibromyalgia Association: fmaware.org Where to find more information U.S. Pain Foundation: uspainfoundation.org Contact a health care provider if: You have new symptoms. Your symptoms get worse or your pain is severe. You have side effects from your medicines. You have trouble sleeping. Your condition is causing depression or anxiety. Get help right away if: You have thoughts of hurting yourself or others. Get help right away if you feel like you may hurt yourself or others, or have thoughts about taking your own life. Go to your nearest emergency room or: Call 911. Call the National Suicide Prevention Lifeline at 1-800-273-8255  or 988. This is open 24 hours a day. Text the Crisis Text Line at 741741. This information is not intended to replace advice given to you by your health care provider. Make sure you discuss any questions you have with your health care provider. Document Revised: 07/17/2022 Document Reviewed: 09/09/2021 Elsevier Patient Education  2024 Elsevier Inc.  

## 2024-03-18 NOTE — Progress Notes (Signed)
 Established Patient Office Visit  Subjective   Patient ID: Emily Maddox, female    DOB: Nov 26, 1970  Age: 53 y.o. MRN: [id number]  No chief complaint on file.   HPI Discussed the use of AI scribe software for clinical note transcription with the patient, who gave verbal consent to proceed.  History of Present Illness Emily Maddox is a 53 year old female who presents for a follow-up regarding her FMLA paperwork and sleep issues.  She was stung by a wasp twice, resulting in significant pain and itching. The pain was severe on Friday and Saturday but has since subsided, leaving only itching.  She discusses her FMLA paperwork, noting that her previous FMLA period ended in April of last year. She took a three-month period off for her hand, which has since improved significantly. She describes the pain as having been severe, causing her to scream, but now reports no pain. She sometimes experiences flare-ups that require her to take one to two days off work. She estimates needing FMLA for 10 to 12 days per month, lasting one to two days per episode. Some days are better than others at work.  She mentions sleep disturbances, including loud snoring as reported by her sister, and difficulty waking up to an alarm clock. She recalls receiving a letter from a sleep study but is unsure of the details. She lives alone, which makes it difficult to self-monitor her sleep issues.   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   At risk for central sleep apnea 03/06/2022   Type 2 diabetes mellitus with hyperglycemia, without long-term current use of insulin (HCC) 05/31/2020   Educated  about COVID-19 virus infection 03/23/2020   Influenza 11/05/2018   Hyperglycemia 08/29/2018   Essential hypertension 08/29/2018   Myalgia 05/10/2018   DM (diabetes mellitus) type II uncontrolled, periph vascular disorder 05/10/2018   Hyperlipidemia associated with type 2 diabetes mellitus (HCC) 05/10/2018   B12 deficiency 05/10/2018   Chronic pain syndrome 05/10/2018   High risk medication use 05/10/2018   Snoring 03/26/2018   Nocturia more than twice per night 03/26/2018   Insomnia 01/28/2018   Preventative health care 03/06/2017   Fibromyalgia 07/26/2015   Bulging lumbar disc 05/22/2013   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   Hiatal hernia 08/22/2010   Fibroids 09/03/2009   Diabetes mellitus type II, controlled (HCC) 11/27/2008   Hyperlipidemia 11/27/2008   TOBACCO ABUSE 11/25/2008   Lumbar degenerative disc disease 11/25/2008   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   Diabetes mellitus    Hyperlipidemia    Tachycardia    Past Surgical History:  Procedure Laterality Date   BREAST EXCISIONAL BIOPSY Left 2003   Social History   Tobacco Use   Smoking status: Every Day    Current packs/day: 1.00    Average packs/day: 1 pack/day for 19.0 years (19.0 ttl pk-yrs)    Types: Cigarettes   Smokeless tobacco: Never   Tobacco comments:    couldn't tolerate chantix, cut down to 0.5 pack since 11/2013  Vaping Use   Vaping status: Never Used  Substance Use Topics   Alcohol use:  No   Drug use: No   Social History   Socioeconomic History   Marital status: Single    Spouse name: Not on file   Number of children: Not on file   Years of education: Not on file   Highest education level: Bachelor's degree (e.g., BA, AB, BS)  Occupational History    Employer: [employer]   Occupation: [employer]  Tobacco Use   Smoking status: Every Day    Current packs/day: 1.00    Average packs/day: 1 pack/day for  19.0 years (19.0 ttl pk-yrs)    Types: Cigarettes   Smokeless tobacco: Never   Tobacco comments:    couldn't tolerate chantix, cut down to 0.5 pack since 11/2013  Vaping Use   Vaping status: Never Used  Substance and Sexual Activity   Alcohol use: No   Drug use: No   Sexual activity: Yes    Partners: Female  Other Topics Concern   Not on file  Social History Narrative   Lives alone.     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 in business administration   Exercise--no     R handed   Caffeine: 3 c of coffee a day   Social Drivers of Corporate investment banker Strain: Low Risk  (03/31/2021)   Overall Financial Resource Strain (CARDIA)    Difficulty of Paying Living Expenses: Not hard at all  Food Insecurity: No Food Insecurity (03/31/2021)   Hunger Vital Sign    Worried About Running Out of Food in the Last Year: Never true    Ran Out of Food in the Last Year: Never true  Transportation Needs: No Transportation Needs (03/31/2021)   PRAPARE - Administrator, Civil Service (Medical): No    Lack of Transportation (Non-Medical): No  Physical Activity: Sufficiently Active (03/31/2021)   Exercise Vital Sign    Days of Exercise per Week: 3 days    Minutes of Exercise per Session: 50 min  Stress: Stress Concern Present (03/31/2021)   [employer] of Occupational Health - Occupational Stress Questionnaire    Feeling of Stress : To some extent  Social Connections: Moderately Isolated (03/31/2021)   Social Connection and Isolation Panel [NHANES]    Frequency of Communication with Friends and Family: More than three times a week    Frequency of Social Gatherings with Friends and Family: More than three times a week    Attends Religious Services: More than 4 times per year    Active Member of [employer] or Organizations: Not on file    Attends Banker Meetings: Never    Marital Status: Never married  Intimate Partner Violence: Not At Risk (03/31/2021)   Humiliation, Afraid,  Rape, and Kick questionnaire    Fear of Current or Ex-Partner: No    Emotionally Abused: No    Physically Abused: No    Sexually Abused: No   Family Status  Relation Name Status   Mother  Deceased at age 9       breast CA   Father  Deceased at age 41       lawn mower accident   Sister  Alive   Brother  Alive   MGM  Deceased   MGF  Deceased   PGM  Deceased   PGF  Deceased   Other  (Not Specified)   Other  (Not Specified)  No partnership data on file   Family History  Problem Relation Age of Onset   Cancer Mother 47  breast   Hypertension Mother    Hyperlipidemia Mother    Diabetes Mother    Breast cancer Mother 64   Cancer Father 91       colon   Hypertension Father    Hyperlipidemia Father    Heart disease Father        a fib   Diabetes Father    Diabetes Sister    Breast cancer Other    Colon cancer Other    Allergies  Allergen Reactions   Metoprolol  Other (See Comments)    exhaustion   Iodinated Contrast Media    Latex    Shellfish Allergy    Ultram  [Tramadol ] Palpitations      Review of Systems  Constitutional:  Negative for fever and malaise/fatigue.  HENT:  Negative for congestion.   Eyes:  Negative for blurred vision.  Respiratory:  Negative for shortness of breath.   Cardiovascular:  Negative for chest pain, palpitations and leg swelling.  Gastrointestinal:  Negative for abdominal pain, blood in stool and nausea.  Genitourinary:  Negative for dysuria and frequency.  Musculoskeletal:  Negative for falls.  Skin:  Negative for rash.  Neurological:  Negative for dizziness, loss of consciousness and headaches.  Endo/Heme/Allergies:  Negative for environmental allergies.  Psychiatric/Behavioral:  Negative for depression. The patient is not nervous/anxious.       Objective:     BP (!) 128/96   Pulse 88   Temp 98.1 F (36.7 C)   Resp 18   Ht 5\' 6"  (1.676 m)   Wt 203 lb 9.6 oz (92.4 kg)   SpO2 98%   BMI 32.86 kg/m  BP Readings from  Last 3 Encounters:  03/18/24 (!) 128/96  11/06/23 110/80  10/02/23 113/84   Wt Readings from Last 3 Encounters:  03/18/24 203 lb 9.6 oz (92.4 kg)  01/25/24 202 lb 6.4 oz (91.8 kg)  11/06/23 201 lb 6.4 oz (91.4 kg)   SpO2 Readings from Last 3 Encounters:  03/18/24 98%  01/25/24 96%  11/06/23 99%      Physical Exam Vitals and nursing note reviewed.  Constitutional:      General: She is not in acute distress.    Appearance: Normal appearance. She is well-developed.  HENT:     Head: Normocephalic and atraumatic.     Right Ear: Tympanic membrane, ear canal and external ear normal. There is no impacted cerumen.     Left Ear: Tympanic membrane, ear canal and external ear normal. There is no impacted cerumen.     Nose: Nose normal.     Mouth/Throat:     Mouth: Mucous membranes are moist.     Pharynx: Oropharynx is clear. No oropharyngeal exudate or posterior oropharyngeal erythema.  Eyes:     General: No scleral icterus.       Right eye: No discharge.        Left eye: No discharge.     Conjunctiva/sclera: Conjunctivae normal.     Pupils: Pupils are equal, round, and reactive to light.  Neck:     Thyroid : No thyromegaly or thyroid  tenderness.     Vascular: No JVD.  Cardiovascular:     Rate and Rhythm: Normal rate and regular rhythm.     Heart sounds: Normal heart sounds. No murmur heard. Pulmonary:     Effort: Pulmonary effort is normal. No respiratory distress.     Breath sounds: Normal breath sounds.  Abdominal:     General: Bowel sounds are normal. There is no distension.  Palpations: Abdomen is soft. There is no mass.     Tenderness: There is no abdominal tenderness. There is no guarding or rebound.  Genitourinary:    Vagina: Normal.  Musculoskeletal:        General: Normal range of motion.     Cervical back: Normal range of motion and neck supple.     Right lower leg: No edema.     Left lower leg: No edema.  Lymphadenopathy:     Cervical: No cervical  adenopathy.  Skin:    General: Skin is warm and dry.     Findings: No erythema or rash.  Neurological:     Mental Status: She is alert and oriented to person, place, and time.     Cranial Nerves: No cranial nerve deficit.     Deep Tendon Reflexes: Reflexes are normal and symmetric.  Psychiatric:        Mood and Affect: Mood normal.        Behavior: Behavior normal.        Thought Content: Thought content normal.        Judgment: Judgment normal.      No results found for any visits on 03/18/24.  Last CBC Lab Results  Component Value Date   WBC 7.5 01/25/2024   HGB 14.2 01/25/2024   HCT 42.3 01/25/2024   MCV 99.1 01/25/2024   MCH 32.5 04/07/2018   RDW 13.3 01/25/2024   PLT 288.0 01/25/2024   Last metabolic panel Lab Results  Component Value Date   GLUCOSE 79 01/25/2024   NA 141 01/25/2024   K 4.5 01/25/2024   CL 104 01/25/2024   CO2 27 01/25/2024   BUN 12 01/25/2024   CREATININE 0.97 01/25/2024   GFR 67.70 01/25/2024   CALCIUM  9.5 01/25/2024   PROT 7.4 01/25/2024   ALBUMIN 4.6 01/25/2024   LABGLOB 2.9 03/06/2022   AGRATIO 1.4 03/06/2022   BILITOT 0.3 01/25/2024   ALKPHOS 69 01/25/2024   AST 15 01/25/2024   ALT 9 01/25/2024   ANIONGAP 10 04/07/2018   Last lipids Lab Results  Component Value Date   CHOL 177 01/25/2024   HDL 57.40 01/25/2024   LDLCALC 105 (H) 01/25/2024   LDLDIRECT 143.7 11/12/2013   TRIG 76.0 01/25/2024   CHOLHDL 3 01/25/2024   Last hemoglobin A1c Lab Results  Component Value Date   HGBA1C 6.5 01/25/2024   Last thyroid  functions Lab Results  Component Value Date   TSH 0.83 01/25/2024   T4TOTAL 8.2 01/25/2024   Last vitamin D  Lab Results  Component Value Date   VD25OH 38.33 01/25/2024   Last vitamin B12 and Folate Lab Results  Component Value Date   VITAMINB12 331 01/25/2024   FOLATE 9.7 05/31/2010      The 10-year ASCVD risk score (Arnett DK, et al., 2019) is: 14.1%    Assessment & Plan:   Problem List Items  Addressed This Visit       Unprioritized   Snoring   High risk medication use - Primary   Relevant Orders   Drug Monitoring Panel [phone] , Urine   Type 2 diabetes mellitus with hyperglycemia, without long-term current use of insulin (HCC)   B12 deficiency   Vitamin D  deficiency   Hyperlipidemia associated with type 2 diabetes mellitus (HCC)   Check albs  F/u endo      Fibromyalgia   With chronic pain        Essential hypertension   Well controlled, no changes to meds. Encouraged  heart healthy diet such as the DASH diet and exercise as tolerated.        Chronic pain syndrome   Update contract and uds Database reviewed      Assessment and Plan Assessment & Plan Wasp sting   Wasp sting on the head initially caused pain, now subsided, leaving only itching. Symptoms have improved since Friday. Apply Cortisone cream to the affected area to alleviate itching.    Return in about 3 months (around 06/18/2024).    Kenyanna Grzesiak R Lowne Chase, DO

## 2024-03-18 NOTE — Assessment & Plan Note (Signed)
 Check albs  F/u endo

## 2024-03-20 LAB — DM TEMPLATE

## 2024-03-20 LAB — DRUG MONITORING PANEL 376104, URINE

## 2024-03-24 ENCOUNTER — Telehealth: Payer: Self-pay | Admitting: *Deleted

## 2024-03-24 NOTE — Telephone Encounter (Signed)
 UDS Leaked in transit.  Per Dr. Jalene Mayor ok to recollect at next visit.  Pt notified and next ov scheduled 06/19/24

## 2024-03-26 ENCOUNTER — Other Ambulatory Visit: Payer: Self-pay | Admitting: Family Medicine

## 2024-03-26 DIAGNOSIS — M797 Fibromyalgia: Secondary | ICD-10-CM

## 2024-03-26 DIAGNOSIS — G894 Chronic pain syndrome: Secondary | ICD-10-CM

## 2024-03-26 DIAGNOSIS — M65941 Unspecified synovitis and tenosynovitis, right hand: Secondary | ICD-10-CM

## 2024-03-26 MED ORDER — ROSUVASTATIN CALCIUM 40 MG PO TABS: 40.0000 mg | ORAL_TABLET | Freq: Every day | ORAL | 0 refills | Status: DC

## 2024-03-26 MED ORDER — HYDROCODONE-ACETAMINOPHEN 10-325 MG PO TABS: 1.0000 | ORAL_TABLET | Freq: Four times a day (QID) | ORAL | 0 refills | Status: DC | PRN

## 2024-03-26 NOTE — Telephone Encounter (Signed)
 Last Fill: Rosuvastatin : Unk, Historical Provider      Norco: 02/29/24 60 tabs/0 RF  Last OV: 03/18/24 Next OV: 06/19/24  Routing to provider for review/authorization.   Copied from CRM [id number]. Topic: Clinical - Medication Refill >> Mar 26, 2024  8:27 AM Dorisann Garre T wrote: Medication: rosuvastatin  (CRESTOR ) 40 MG tablet       HYDROcodone -acetaminophen  (NORCO) 10-325 MG tablet [[id number]] Has the patient contacted their pharmacy? Yes (Agent: If no, request that the patient contact the pharmacy for the refill. If patient does not wish to contact the pharmacy document the reason why and proceed with request.) (Agent: If yes, when and what did the pharmacy advise?)  This is the patient's preferred pharmacy:  [id number] - [place], [place] - [address] [place] [address] Phone: [phone] Fax: [phone]  Is this the correct pharmacy for this prescription? Yes If no, delete pharmacy and type the correct one.   Has the prescription been filled recently? Yes  Is the patient out of the medication? Yes  Has the patient been seen for an appointment in the last year OR does the patient have an upcoming appointment? Yes  Can we respond through MyChart? Yes  Agent: Please be advised that Rx refills may take up to 3 business days. We ask that you follow-up with your pharmacy.

## 2024-03-26 NOTE — Telephone Encounter (Signed)
 Requesting: hydrocodone  10-325mg   Contract:03/18/24? UDS:03/18/24 Last Visit: 03/18/24 Next Visit:06/19/24 Last Refill: 02/29/24 #60 and 0RF   Please Advise

## 2024-04-07 ENCOUNTER — Telehealth: Payer: Self-pay

## 2024-04-07 MED ORDER — FREESTYLE LIBRE 3 PLUS SENSOR MISC: 3 refills | Status: DC

## 2024-04-07 NOTE — Telephone Encounter (Signed)
 Copied from CRM [id number]. Topic: General - Other >> Apr 07, 2024  9:08 AM Emily Maddox wrote: Reason for CRM: Patient is requesting a device that she can wear on her arm to continuously monitor her blood sugar. Please call to [phone] to confirm request

## 2024-04-08 ENCOUNTER — Other Ambulatory Visit (HOSPITAL_COMMUNITY): Payer: Self-pay

## 2024-04-08 ENCOUNTER — Telehealth: Payer: Self-pay

## 2024-04-08 NOTE — Telephone Encounter (Signed)
 Pharmacy Patient Advocate Encounter   Received notification from CoverMyMeds that prior authorization for Free Style Libre 3 plus sensor is required/requested.   Insurance verification completed.   The patient is insured through [employer] .   Per test claim: The current 30 day co-pay is, $74.99.  No PA needed at this time. This test claim was processed through [facility]- copay amounts may vary at other pharmacies due to pharmacy/plan contracts, or as the patient moves through the different stages of their insurance plan.

## 2024-04-22 ENCOUNTER — Encounter: Payer: Self-pay | Admitting: Family Medicine

## 2024-04-22 DIAGNOSIS — M797 Fibromyalgia: Secondary | ICD-10-CM

## 2024-04-22 DIAGNOSIS — M65942 Unspecified synovitis and tenosynovitis, left hand: Secondary | ICD-10-CM

## 2024-04-22 DIAGNOSIS — G894 Chronic pain syndrome: Secondary | ICD-10-CM

## 2024-04-23 ENCOUNTER — Telehealth: Payer: Self-pay

## 2024-04-23 NOTE — Telephone Encounter (Signed)
 Requesting: NORCO Contract: 03/18/24 UDS: 03/18/24  Last OV: 03/18/24 Next OV: 06/19/2024 Last Refill: 03/26/2024, #60--0 RF Database:   Please advise

## 2024-04-23 NOTE — Telephone Encounter (Signed)
 Pt called for a f/u visit d/t new sleep issues. Pt stated she is exhausted most of the time and is up most nights 5-8 times. Was able to schedule Pt for visit in Oct and Pt requested to be added to wait list.

## 2024-04-23 NOTE — Telephone Encounter (Signed)
 Pt called wanting to schedule a follow up visit with Dr. Chalice - we received a referral previously but it is not needed as she is still considered an active patient (last seen in 2023). Please contact patient at [phone] to schedule.

## 2024-04-24 MED ORDER — HYDROCODONE-ACETAMINOPHEN 10-325 MG PO TABS: 1.0000 | ORAL_TABLET | Freq: Four times a day (QID) | ORAL | 0 refills | Status: DC | PRN

## 2024-04-29 ENCOUNTER — Other Ambulatory Visit: Payer: Self-pay | Admitting: Family Medicine

## 2024-04-30 NOTE — Telephone Encounter (Signed)
 See below. No PA required.

## 2024-04-30 NOTE — Telephone Encounter (Signed)
 Copied from CRM [id number]. Topic: Clinical - Medication Prior Auth >> Apr 30, 2024  4:33 PM Emily Maddox wrote: Reason for CRM: Patient called in stating she needed a pre approval for the Continuous Glucose Sensor (FREESTYLE LIBRE 3 PLUS SENSOR) MISC [[id number]] through her insurance.

## 2024-05-01 NOTE — Telephone Encounter (Signed)
 Copied from CRM [id number]. Topic: General - Other >> Apr 30, 2024  4:32 PM Chiquita SQUIBB wrote: Reason for CRM: Patient is calling in regarding her paperwork she sent through [facility], patient stated that the doctor can mirror the previous paperwork it is just expired. Please advise patient on an update.

## 2024-05-13 ENCOUNTER — Other Ambulatory Visit: Payer: Self-pay | Admitting: Family Medicine

## 2024-05-13 DIAGNOSIS — E1165 Type 2 diabetes mellitus with hyperglycemia: Secondary | ICD-10-CM

## 2024-05-14 ENCOUNTER — Encounter: Payer: Self-pay | Admitting: Neurology

## 2024-05-14 ENCOUNTER — Ambulatory Visit: Admitting: Neurology

## 2024-05-23 ENCOUNTER — Other Ambulatory Visit: Payer: Self-pay | Admitting: Family Medicine

## 2024-05-23 DIAGNOSIS — M797 Fibromyalgia: Secondary | ICD-10-CM

## 2024-05-23 DIAGNOSIS — G894 Chronic pain syndrome: Secondary | ICD-10-CM

## 2024-05-23 DIAGNOSIS — M65941 Unspecified synovitis and tenosynovitis, right hand: Secondary | ICD-10-CM

## 2024-05-23 NOTE — Telephone Encounter (Signed)
 Copied from CRM [id number]. Topic: Clinical - Medication Refill >> May 23, 2024  2:55 PM Aisha D wrote: Medication: HYDROcodone -acetaminophen  (NORCO) 10-325 MG tablet  Has the patient contacted their pharmacy? No (Agent: If no, request that the patient contact the pharmacy for the refill. If patient does not wish to contact the pharmacy document the reason why and proceed with request.) (Agent: If yes, when and what did the pharmacy advise?)  This is the patient's preferred pharmacy:  [facility] - [place], [place] - [address] [place] [address] Phone: [phone] Fax: [phone]  Is this the correct pharmacy for this prescription? Yes If no, delete pharmacy and type the correct one.   Has the prescription been filled recently? Yes  Is the patient out of the medication? No  Has the patient been seen for an appointment in the last year OR does the patient have an upcoming appointment? Yes  Can we respond through MyChart? Yes  Agent: Please be advised that Rx refills may take up to 3 business days. We ask that you follow-up with your pharmacy.

## 2024-05-26 MED ORDER — HYDROCODONE-ACETAMINOPHEN 10-325 MG PO TABS: 1.0000 | ORAL_TABLET | Freq: Four times a day (QID) | ORAL | 0 refills | Status: DC | PRN

## 2024-05-26 NOTE — Telephone Encounter (Signed)
 Requesting: hydrocodone /apap 10/325mg  Contract: Yes UDS: 03/18/24 Last Visit: 03/18/2024 Next Visit: 06/19/2024 Last Refill: 04/24/2024  Please Advise

## 2024-05-29 ENCOUNTER — Encounter: Payer: Self-pay | Admitting: Neurology

## 2024-05-29 ENCOUNTER — Ambulatory Visit: Admitting: Neurology

## 2024-05-29 VITALS — BP 116/86 | HR 84 | Ht 67.0 in | Wt 196.0 lb

## 2024-05-29 DIAGNOSIS — G4719 Other hypersomnia: Secondary | ICD-10-CM

## 2024-05-29 DIAGNOSIS — G894 Chronic pain syndrome: Secondary | ICD-10-CM | POA: Diagnosis not present

## 2024-05-29 DIAGNOSIS — M797 Fibromyalgia: Secondary | ICD-10-CM | POA: Diagnosis not present

## 2024-05-29 DIAGNOSIS — M791 Myalgia, unspecified site: Secondary | ICD-10-CM

## 2024-05-29 DIAGNOSIS — Z8249 Family history of ischemic heart disease and other diseases of the circulatory system: Secondary | ICD-10-CM | POA: Diagnosis not present

## 2024-05-29 MED ORDER — ALPRAZOLAM 0.5 MG PO TABS: 0.5000 mg | ORAL_TABLET | Freq: Every evening | ORAL | 0 refills | Status: DC | PRN
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
## 2024-05-30 ENCOUNTER — Ambulatory Visit: Payer: Self-pay | Admitting: Neurology

## 2024-06-04 ENCOUNTER — Telehealth: Payer: Self-pay

## 2024-06-04 NOTE — Telephone Encounter (Signed)
 Copied from CRM [id number]. Topic: General - Other >> Jun 04, 2024  8:54 AM Rosina BIRCH wrote: Reason for CRM: MD Rosalynn [place] from the federal government called stating he need clarification on a FMLA form for the patient in regards to her being out of work. It is normally five days of being out of work and the doctor just has to circle that part. MD Rosalynn stated it has been faxed twice on the 7th and 11th CB [phone]

## 2024-06-06 NOTE — Telephone Encounter (Signed)
 Form received

## 2024-06-06 NOTE — Telephone Encounter (Signed)
 Form faxed back.

## 2024-06-09 ENCOUNTER — Other Ambulatory Visit: Payer: Self-pay | Admitting: Family

## 2024-06-09 DIAGNOSIS — E1165 Type 2 diabetes mellitus with hyperglycemia: Secondary | ICD-10-CM

## 2024-06-10 LAB — NARCOLEPSY EVALUATION
DQA1*01:02: NEGATIVE
DQB1*06:02: NEGATIVE

## 2024-06-10 LAB — CK TOTAL AND CKMB (NOT AT ARMC)
CK-MB Index: 1.1 ng/mL (ref 0.0–5.3)
Total CK: 111 U/L (ref 32–182)

## 2024-06-19 ENCOUNTER — Encounter: Payer: Self-pay | Admitting: Family Medicine

## 2024-06-19 ENCOUNTER — Ambulatory Visit: Admitting: Family Medicine

## 2024-06-19 VITALS — BP 112/88 | HR 80 | Temp 98.7°F | Resp 18 | Ht 67.0 in | Wt 192.8 lb

## 2024-06-19 DIAGNOSIS — M65942 Unspecified synovitis and tenosynovitis, left hand: Secondary | ICD-10-CM

## 2024-06-19 DIAGNOSIS — E559 Vitamin D deficiency, unspecified: Secondary | ICD-10-CM

## 2024-06-19 DIAGNOSIS — E785 Hyperlipidemia, unspecified: Secondary | ICD-10-CM | POA: Diagnosis not present

## 2024-06-19 DIAGNOSIS — G894 Chronic pain syndrome: Secondary | ICD-10-CM

## 2024-06-19 DIAGNOSIS — E1165 Type 2 diabetes mellitus with hyperglycemia: Secondary | ICD-10-CM

## 2024-06-19 DIAGNOSIS — Z79899 Other long term (current) drug therapy: Secondary | ICD-10-CM

## 2024-06-19 DIAGNOSIS — Z7985 Long-term (current) use of injectable non-insulin antidiabetic drugs: Secondary | ICD-10-CM

## 2024-06-19 DIAGNOSIS — E1169 Type 2 diabetes mellitus with other specified complication: Secondary | ICD-10-CM

## 2024-06-19 DIAGNOSIS — M65941 Unspecified synovitis and tenosynovitis, right hand: Secondary | ICD-10-CM

## 2024-06-19 DIAGNOSIS — E538 Deficiency of other specified B group vitamins: Secondary | ICD-10-CM | POA: Diagnosis not present

## 2024-06-19 DIAGNOSIS — I10 Essential (primary) hypertension: Secondary | ICD-10-CM | POA: Diagnosis not present

## 2024-06-19 DIAGNOSIS — K5901 Slow transit constipation: Secondary | ICD-10-CM

## 2024-06-19 DIAGNOSIS — M797 Fibromyalgia: Secondary | ICD-10-CM

## 2024-06-19 LAB — COMPREHENSIVE METABOLIC PANEL WITH GFR
ALT: 7 U/L (ref 0–35)
AST: 13 U/L (ref 0–37)
Albumin: 4.4 g/dL (ref 3.5–5.2)
Alkaline Phosphatase: 72 U/L (ref 39–117)
BUN: 9 mg/dL (ref 6–23)
CO2: 29 meq/L (ref 19–32)
Calcium: 9.2 mg/dL (ref 8.4–10.5)
Chloride: 102 meq/L (ref 96–112)
Creatinine, Ser: 0.98 mg/dL (ref 0.40–1.20)
GFR: 66.68 mL/min (ref >60.00)
Glucose, Bld: 83 mg/dL (ref 70–99)
Potassium: 4.5 meq/L (ref 3.5–5.1)
Sodium: 140 meq/L (ref 135–145)
Total Bilirubin: 0.3 mg/dL (ref 0.2–1.2)
Total Protein: 7.4 g/dL (ref 6.0–8.3)

## 2024-06-19 LAB — CBC WITH DIFFERENTIAL/PLATELET
Basophils Absolute: 0.1 K/uL (ref 0.0–0.1)
Basophils Relative: 0.9 % (ref 0.0–3.0)
Eosinophils Absolute: 0.2 K/uL (ref 0.0–0.7)
Eosinophils Relative: 2.1 % (ref 0.0–5.0)
HCT: 42.5 % (ref 36.0–46.0)
Hemoglobin: 14 g/dL (ref 12.0–15.0)
Lymphocytes Relative: 30.5 % (ref 12.0–46.0)
Lymphs Abs: 2.3 K/uL (ref 0.7–4.0)
MCHC: 33.1 g/dL (ref 30.0–36.0)
MCV: 97.1 fl (ref 78.0–100.0)
Monocytes Absolute: 0.4 K/uL (ref 0.1–1.0)
Monocytes Relative: 5.7 % (ref 3.0–12.0)
Neutro Abs: 4.5 K/uL (ref 1.4–7.7)
Neutrophils Relative %: 60.8 % (ref 43.0–77.0)
Platelets: 308 K/uL (ref 150.0–400.0)
RBC: 4.37 Mil/uL (ref 3.87–5.11)
RDW: 13.5 % (ref 11.5–15.5)
WBC: 7.4 K/uL (ref 4.0–10.5)

## 2024-06-19 LAB — LIPID PANEL
Cholesterol: 170 mg/dL (ref 0–200)
HDL: 53.8 mg/dL (ref >39.00)
LDL Cholesterol: 100 mg/dL — ABNORMAL HIGH (ref 0–99)
NonHDL: 116.35
Total CHOL/HDL Ratio: 3
Triglycerides: 84 mg/dL (ref 0.0–149.0)
VLDL: 16.8 mg/dL (ref 0.0–40.0)

## 2024-06-19 LAB — VITAMIN B12: Vitamin B-12: 284 pg/mL (ref 211–911)

## 2024-06-19 LAB — MICROALBUMIN / CREATININE URINE RATIO
Creatinine,U: 196 mg/dL
Microalb Creat Ratio: 4.2 mg/g (ref 0.0–30.0)
Microalb, Ur: 0.8 mg/dL (ref 0.0–1.9)

## 2024-06-19 LAB — HEMOGLOBIN A1C: Hgb A1c MFr Bld: 6.5 % (ref 4.6–6.5)

## 2024-06-19 LAB — VITAMIN D 25 HYDROXY (VIT D DEFICIENCY, FRACTURES): VITD: 38.43 ng/mL (ref 30.00–100.00)

## 2024-06-19 MED ORDER — TIRZEPATIDE 5 MG/0.5ML ~~LOC~~ SOAJ: 5.0000 mg | SUBCUTANEOUS | 2 refills | Status: DC

## 2024-06-19 MED ORDER — LINACLOTIDE 290 MCG PO CAPS: 290.0000 ug | ORAL_CAPSULE | Freq: Every day | ORAL | Status: AC

## 2024-06-19 MED ORDER — HYDROCODONE-ACETAMINOPHEN 10-325 MG PO TABS: 1.0000 | ORAL_TABLET | Freq: Four times a day (QID) | ORAL | 0 refills | Status: DC | PRN

## 2024-06-19 NOTE — Assessment & Plan Note (Signed)
 Encourage heart healthy diet such as MIND or DASH diet, increase exercise, avoid trans fats, simple carbohydrates and processed foods, consider a krill or fish or flaxseed oil cap daily.

## 2024-06-19 NOTE — Assessment & Plan Note (Signed)
 Well controlled, no changes to meds. Encouraged heart healthy diet such as the DASH diet and exercise as tolerated.

## 2024-06-19 NOTE — Progress Notes (Signed)
 Subjective:    Patient ID: Emily Maddox, female    DOB: [birth year], 53 y.o.   MRN: [id number]  Chief Complaint  Patient presents with   Hyperlipidemia   Diabetes   Follow-up   F/ HPI Patient is in today for f/u .  Discussed the use of AI scribe software for clinical note transcription with the patient, who gave verbal consent to proceed.  History of Present Illness Emily Maddox is a 53 year old female who presents for medication refills and follow-up on weight management and gastrointestinal issues.  She is currently using Mounjaro  2.5 mg for weight management and experiences a sensation in her neck for the first two days post-injection, which then subsides. She has noticed weight loss and attributes it to the medication, which has also reduced her junk food intake. She requires a refill of Mounjaro  by September 12th.  She has a history of gastrointestinal issues and underwent a colonoscopy last month, during which she experienced vomiting. Pathology results were followed up, and she was prescribed Linzess  290 mcg for daily bowel movements.  She is undergoing evaluation for sleep disturbances and is awaiting an at-home sleep study to assess for narcolepsy. She has been tested for narcolepsy and is waiting for further instructions.  Her current medications include hydrocodone , rosuvastatin , and vitamin D . She has enough vitamin D  and hydrocodone  for now but will need a refill soon. She is concerned about her A1c levels, expecting them to be between 6.7 and 6.9, and notes that she is not on insulin.  She has a history of menopause, which she believes has affected her weight and eating habits. She is overdue for a mammogram and plans to schedule it.  She experiences hot flashes and forgetfulness, which she attributes to aging.    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   Diabetes mellitus     Hyperlipidemia    Tachycardia     Past Surgical History:  Procedure Laterality Date   BREAST EXCISIONAL BIOPSY Left 2003    Family History  Problem Relation Age of Onset   Cancer Mother 23       breast   Hypertension Mother    Hyperlipidemia Mother    Diabetes Mother    Breast cancer Mother 65   Cancer Father 47       colon   Hypertension Father    Hyperlipidemia Father    Heart disease Father        a fib   Diabetes Father    Diabetes Sister    Breast cancer Other    Colon cancer Other     Social History   Socioeconomic History   Marital status: Single    Spouse name: Not on file   Number of children: Not on file   Years of education: Not on file   Highest education level: Bachelor's degree (e.g., BA, AB, BS)  Occupational History    Employer: [employer]   Occupation: [employer]  Tobacco Use   Smoking status: Every Day    Current packs/day: 1.00    Average packs/day: 1 pack/day for 19.0 years (19.0 ttl pk-yrs)    Types: Cigarettes   Smokeless tobacco: Never   Tobacco comments:    couldn't tolerate chantix, cut down to 0.5 pack since 11/2013  Vaping Use   Vaping status: Never Used  Substance and Sexual Activity   Alcohol use: No   Drug  use: No   Sexual activity: Yes    Partners: Female  Other Topics Concern   Not on file  Social History Narrative   Lives alone.     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 in business administration   Exercise--no     R handed   Caffeine: 3 c of coffee a day   Social Drivers of Corporate investment banker Strain: Low Risk  (03/31/2021)   Overall Financial Resource Strain (CARDIA)    Difficulty of Paying Living Expenses: Not hard at all  Food Insecurity: No Food Insecurity (03/31/2021)   Hunger Vital Sign    Worried About Running Out of Food in the Last Year: Never true    Ran Out of Food in the Last Year: Never true  Transportation Needs: No Transportation Needs (03/31/2021)   PRAPARE - Administrator, Civil Service (Medical):  No    Lack of Transportation (Non-Medical): No  Physical Activity: Sufficiently Active (03/31/2021)   Exercise Vital Sign    Days of Exercise per Week: 3 days    Minutes of Exercise per Session: 50 min  Stress: Stress Concern Present (03/31/2021)   [employer] of Occupational Health - Occupational Stress Questionnaire    Feeling of Stress : To some extent  Social Connections: Moderately Isolated (03/31/2021)   Social Connection and Isolation Panel    Frequency of Communication with Friends and Family: More than three times a week    Frequency of Social Gatherings with Friends and Family: More than three times a week    Attends Religious Services: More than 4 times per year    Active Member of [employer] or Organizations: Not on file    Attends Banker Meetings: Never    Marital Status: Never married  Intimate Partner Violence: Not At Risk (03/31/2021)   Humiliation, Afraid, Rape, and Kick questionnaire    Fear of Current or Ex-Partner: No    Emotionally Abused: No    Physically Abused: No    Sexually Abused: No    Outpatient Medications Prior to Visit  Medication Sig Dispense Refill   ALPRAZolam  (XANAX ) 0.5 MG tablet Take 1 tablet (0.5 mg total) by mouth at bedtime as needed for anxiety or sleep (for sleep test ,HST). 2 tablet 0   Blood Glucose Monitoring Suppl (ONETOUCH VERIO FLEX SYSTEM) w/Device KIT 1 each by Does not apply route daily. Dx code: E11.9 1 kit 0   Continuous Glucose Sensor (FREESTYLE LIBRE 3 PLUS SENSOR) MISC Change sensor every 15 days. 2 each 3   Cyanocobalamin  (B-12) 1000 MCG CAPS      glucose blood (ONETOUCH VERIO) test strip USE 1 STRIP TO CHECK GLUCOSE ONCE DAILY 100 each 1   Multiple Vitamin (MULTIVITAMIN) tablet Take 1 tablet by mouth daily.     ONETOUCH DELICA LANCETS 33G MISC Check sugar every day.  Dx Code: E11.9 100 each 1   Probiotic Product (PROBIOTIC DAILY PO) Take 1 capsule by mouth daily.     rosuvastatin  (CRESTOR ) 40 MG tablet Take 1 tablet  (40 mg total) by mouth at bedtime. 90 tablet 0   VITAMIN D  PO Take 2,000 Units by mouth daily.     Vitamin D , Ergocalciferol , (DRISDOL ) 1.25 MG (50000 UNIT) CAPS capsule Take 1 capsule by mouth once a week 12 capsule 0   HYDROcodone -acetaminophen  (NORCO) 10-325 MG tablet Take 1 tablet by mouth every 6 (six) hours as needed. 60 tablet 0   MOUNJARO  2.5 MG/0.5ML Pen INJECT 2.5  MG SUBCUTANEOUSLY ONCE A WEEK 4 mL 0   Fezolinetant (VEOZAH) 45 MG TABS Take 1 tablet (45 mg total) by mouth daily. (Patient not taking: Reported on 06/19/2024) 30 tablet    ondansetron  (ZOFRAN ) 8 MG tablet Take 1 tablet (8 mg total) by mouth every 8 (eight) hours as needed for nausea or vomiting. (Patient not taking: Reported on 06/19/2024) 20 tablet 0   No facility-administered medications prior to visit.    Allergies  Allergen Reactions   Metoprolol  Other (See Comments)    exhaustion   Iodinated Contrast Media    Latex    Shellfish Allergy    Ultram  [Tramadol ] Palpitations    Review of Systems  Constitutional:  Negative for fever and malaise/fatigue.  HENT:  Negative for congestion.   Eyes:  Negative for blurred vision.  Respiratory:  Negative for cough and shortness of breath.   Cardiovascular:  Negative for chest pain, palpitations and leg swelling.  Gastrointestinal:  Negative for vomiting.  Musculoskeletal:  Negative for back pain.  Skin:  Negative for rash.  Neurological:  Negative for loss of consciousness and headaches.       Objective:    Physical Exam  BP 112/88 (BP Location: Left Arm, Patient Position: Sitting)   Pulse 80   Temp 98.7 F (37.1 C) (Oral)   Resp 18   Ht 5' 7 (1.702 m)   Wt 192 lb 12.8 oz (87.5 kg)   SpO2 100%   BMI 30.20 kg/m  Wt Readings from Last 3 Encounters:  06/19/24 192 lb 12.8 oz (87.5 kg)  05/29/24 196 lb (88.9 kg)  03/18/24 203 lb 9.6 oz (92.4 kg)    Diabetic Foot Exam - Simple   No data filed    Lab Results  Component Value Date   WBC 7.5 01/25/2024    HGB 14.2 01/25/2024   HCT 42.3 01/25/2024   PLT 288.0 01/25/2024   GLUCOSE 79 01/25/2024   CHOL 177 01/25/2024   TRIG 76.0 01/25/2024   HDL 57.40 01/25/2024   LDLDIRECT 143.7 11/12/2013   LDLCALC 105 (H) 01/25/2024   ALT 9 01/25/2024   AST 15 01/25/2024   NA 141 01/25/2024   K 4.5 01/25/2024   CL 104 01/25/2024   CREATININE 0.97 01/25/2024   BUN 12 01/25/2024   CO2 27 01/25/2024   TSH 0.83 01/25/2024   HGBA1C 6.5 01/25/2024   MICROALBUR <0.7 01/25/2024    Lab Results  Component Value Date   TSH 0.83 01/25/2024   Lab Results  Component Value Date   WBC 7.5 01/25/2024   HGB 14.2 01/25/2024   HCT 42.3 01/25/2024   MCV 99.1 01/25/2024   PLT 288.0 01/25/2024   Lab Results  Component Value Date   NA 141 01/25/2024   K 4.5 01/25/2024   CO2 27 01/25/2024   GLUCOSE 79 01/25/2024   BUN 12 01/25/2024   CREATININE 0.97 01/25/2024   BILITOT 0.3 01/25/2024   ALKPHOS 69 01/25/2024   AST 15 01/25/2024   ALT 9 01/25/2024   PROT 7.4 01/25/2024   ALBUMIN 4.6 01/25/2024   CALCIUM  9.5 01/25/2024   ANIONGAP 10 04/07/2018   GFR 67.70 01/25/2024   Lab Results  Component Value Date   CHOL 177 01/25/2024   Lab Results  Component Value Date   HDL 57.40 01/25/2024   Lab Results  Component Value Date   LDLCALC 105 (H) 01/25/2024   Lab Results  Component Value Date   TRIG 76.0 01/25/2024   Lab Results  Component Value Date   CHOLHDL 3 01/25/2024   Lab Results  Component Value Date   HGBA1C 6.5 01/25/2024       Assessment & Plan:  Type 2 diabetes mellitus with hyperglycemia, without long-term current use of insulin (HCC) -     Comprehensive metabolic panel with GFR -     Hemoglobin A1c -     Microalbumin / creatinine urine ratio  High risk medication use  Hyperlipidemia associated with type 2 diabetes mellitus (HCC) -     Lipid panel -     CBC with Differential/Platelet -     Tirzepatide ; Inject 5 mg into the skin once a week.  Dispense: 6 mL; Refill:  2  Essential hypertension Assessment & Plan: Well controlled, no changes to meds. Encouraged heart healthy diet such as the DASH diet and exercise as tolerated.    Orders: -     CBC with Differential/Platelet -     Comprehensive metabolic panel with GFR -     Tirzepatide ; Inject 5 mg into the skin once a week.  Dispense: 6 mL; Refill: 2  B12 deficiency -     Vitamin B12  Vitamin D  deficiency -     VITAMIN D  25 Hydroxy (Vit-D Deficiency, Fractures)  Morbid obesity (HCC) -     Vitamin B12 -     Tirzepatide ; Inject 5 mg into the skin once a week.  Dispense: 6 mL; Refill: 2  Tenosynovitis of both hands -     HYDROcodone -Acetaminophen ; Take 1 tablet by mouth every 6 (six) hours as needed.  Dispense: 60 tablet; Refill: 0  Fibromyalgia -     HYDROcodone -Acetaminophen ; Take 1 tablet by mouth every 6 (six) hours as needed.  Dispense: 60 tablet; Refill: 0  Chronic pain syndrome -     HYDROcodone -Acetaminophen ; Take 1 tablet by mouth every 6 (six) hours as needed.  Dispense: 60 tablet; Refill: 0  Slow transit constipation -     linaCLOtide ; Take 1 capsule (290 mcg total) by mouth daily before breakfast.  Assessment and Plan Assessment & Plan Type 2 diabetes mellitus   Type 2 diabetes mellitus is managed with Mounjaro , which effectively reduces appetite and junk food intake. Her current A1c is expected to be between 6.7 and 6.9. She experiences a neck sensation after Mounjaro  injection, an uncommon side effect. Preauthorization for a Libre device was not obtained due to non-insulin dependency. Increase Mounjaro  dose to 5 mg weekly and discuss preauthorization for the Hostetter device with insurance.  Morbid obesity   Morbid obesity is present with recent weight loss, aided by Mounjaro 's appetite suppression. The current weight goal is 180 pounds. Increase Mounjaro  dose to 5 mg weekly to support further weight loss.  Chronic pain syndrome   Chronic pain syndrome is managed with hydrocodone ,  last filled on August 4th. Continue hydrocodone  as needed for pain management.  Essential hypertension   Essential hypertension is well-controlled with blood pressure within the normal range.  Slow transit constipation   Slow transit constipation is managed with Linzess  290 mcg, which provides smooth bowel movements without cramps. Continue Linzess  290 mcg as prescribed.  General Health Maintenance   She is due for a mammogram and plans to schedule it at the breast center. She is considering the shingles vaccine and plans for a flu shot in September or October.    Nuria Phebus R Lowne Chase, DO

## 2024-06-23 ENCOUNTER — Ambulatory Visit: Payer: Self-pay | Admitting: Family Medicine

## 2024-06-27 ENCOUNTER — Other Ambulatory Visit: Payer: Self-pay | Admitting: Family Medicine

## 2024-06-27 DIAGNOSIS — E1169 Type 2 diabetes mellitus with other specified complication: Secondary | ICD-10-CM

## 2024-06-27 DIAGNOSIS — E1165 Type 2 diabetes mellitus with hyperglycemia: Secondary | ICD-10-CM

## 2024-06-27 DIAGNOSIS — I10 Essential (primary) hypertension: Secondary | ICD-10-CM

## 2024-06-27 MED ORDER — TIRZEPATIDE 7.5 MG/0.5ML ~~LOC~~ SOAJ: 7.5000 mg | SUBCUTANEOUS | 2 refills | Status: DC

## 2024-06-27 NOTE — Telephone Encounter (Signed)
 Pt requesting an increase to 7.5mg . Please advise

## 2024-06-27 NOTE — Telephone Encounter (Signed)
 Copied from CRM [id number]. Topic: Clinical - Medication Refill >> Jun 27, 2024  9:27 AM Pinkey ORN wrote: Medication: tirzepatide  (MOUNJARO ) 5 MG/0.5ML Pen  Has the patient contacted their pharmacy? Yes (Agent: If no, request that the patient contact the pharmacy for the refill. If patient does not wish to contact the pharmacy document the reason why and proceed with request.) (Agent: If yes, when and what did the pharmacy advise?)  This is the patient's preferred pharmacy:  [id number] - [place], [place] - [address] [place] [address] Phone: [phone] Fax: [phone]  Is this the correct pharmacy for this prescription? Yes If no, delete pharmacy and type the correct one.   Has the prescription been filled recently? No  Is the patient out of the medication? Yes  Has the patient been seen for an appointment in the last year OR does the patient have an upcoming appointment? Yes  Can we respond through MyChart? Yes  Agent: Please be advised that Rx refills may take up to 3 business days. We ask that you follow-up with your pharmacy. >> Jun 27, 2024  9:28 AM Pinkey ORN wrote: Patient states she's needing an increase in the dosage.

## 2024-07-10 ENCOUNTER — Other Ambulatory Visit: Payer: Self-pay | Admitting: Family Medicine

## 2024-07-10 ENCOUNTER — Telehealth: Payer: Self-pay

## 2024-07-10 DIAGNOSIS — I10 Essential (primary) hypertension: Secondary | ICD-10-CM

## 2024-07-10 DIAGNOSIS — E1169 Type 2 diabetes mellitus with other specified complication: Secondary | ICD-10-CM

## 2024-07-10 MED ORDER — TIRZEPATIDE 5 MG/0.5ML ~~LOC~~ SOAJ: 5.0000 mg | SUBCUTANEOUS | 2 refills | Status: AC

## 2024-07-10 NOTE — Telephone Encounter (Signed)
 Copied from CRM [id number]. Topic: Clinical - Prescription Issue >> Jul 10, 2024 11:12 AM Pinkey ORN wrote: Reason for CRM: tirzepatide  (MOUNJARO ) 7.5 MG/0.5ML Pen >> Jul 10, 2024 11:13 AM Pinkey ORN wrote: Patient states she picked up her tirzepatide  (MOUNJARO ) 7.5 MG/0.5ML Pen on yesterday and it's the wrong dosage, it's supposed to be 5 MG not 7.5 MG

## 2024-07-16 ENCOUNTER — Other Ambulatory Visit: Payer: Self-pay | Admitting: Family Medicine

## 2024-07-16 DIAGNOSIS — G894 Chronic pain syndrome: Secondary | ICD-10-CM

## 2024-07-16 DIAGNOSIS — M797 Fibromyalgia: Secondary | ICD-10-CM

## 2024-07-16 DIAGNOSIS — M65941 Unspecified synovitis and tenosynovitis, right hand: Secondary | ICD-10-CM

## 2024-07-16 NOTE — Telephone Encounter (Unsigned)
 Copied from CRM #[id number]. Topic: Clinical - Medication Refill >> Jul 16, 2024  2:22 PM Henretta I wrote: Medication: HYDROcodone -acetaminophen  (NORCO) 10-325 MG tablet  Has the patient contacted their pharmacy? No (Agent: If no, request that the patient contact the pharmacy for the refill. If patient does not wish to contact the pharmacy document the reason why and proceed with request.) (Agent: If yes, when and what did the pharmacy advise?)  This is the patient's preferred pharmacy:  [id number] - [place], [place] - [address] [place] [address] Phone: [phone] Fax: [phone]   Is this the correct pharmacy for this prescription? Yes If no, delete pharmacy and type the correct one.   Has the prescription been filled recently? No  Is the patient out of the medication? No, has 2 days left   Has the patient been seen for an appointment in the last year OR does the patient have an upcoming appointment? Yes  Can we respond through MyChart? Yes  Agent: Please be advised that Rx refills may take up to 3 business days. We ask that you follow-up with your pharmacy.

## 2024-07-16 NOTE — Telephone Encounter (Signed)
 Requesting: hydrocodone  10-325mg  Contract:03/18/24 UDS: 03/18/24 Last Visit:  06/19/24 Next Visit: None Last Refill: 06/19/24 #60 and 0RF   Please Advise

## 2024-07-17 MED ORDER — HYDROCODONE-ACETAMINOPHEN 10-325 MG PO TABS: 1.0000 | ORAL_TABLET | Freq: Four times a day (QID) | ORAL | 0 refills | Status: DC | PRN

## 2024-07-18 ENCOUNTER — Telehealth: Payer: Self-pay

## 2024-07-18 NOTE — Telephone Encounter (Signed)
 Copied from CRM #[id number]. Topic: Clinical - Prescription Issue >> Jul 18, 2024 12:54 PM Arm[place] J wrote: Reason for CRM: [employer] will not cover her tirzepatide  (MOUNJARO ) 5 MG/0.5ML Pen since her previous refill was already picked up from the pharmacy before the discrepancy of the incorrect dose was noticed.  Then patient is needing us  to call her insurance company to clarify why she is getting another refill sent in so soon. If not, she will have to pay out of pocket. Patient has been without her tirzepatide  (MOUNJARO ) 5 MG/0.5ML Pen for a week now.

## 2024-07-25 NOTE — Telephone Encounter (Signed)
>>   Jul 25, 2024  3:05 PM Pinkey ORN wrote: Patient is seeking updates on this matter, states she's called 3 times this far and she hasn't been on her medication at all since encountering this issue. Patient is afraid that by the time she gets it the 0.5 MG is going to be too strong. Any updates? Please follow up with patient.

## 2024-07-28 ENCOUNTER — Ambulatory Visit: Admitting: Neurology

## 2024-07-29 ENCOUNTER — Ambulatory Visit
Admission: EM | Admit: 2024-07-29 | Discharge: 2024-07-29 | Disposition: A | Discharge status: Home / Self Care | Payer: Worker's Compensation | Attending: Family Medicine | Admitting: Family Medicine

## 2024-07-29 DIAGNOSIS — S46911A Strain of unspecified muscle, fascia and tendon at shoulder and upper arm level, right arm, initial encounter: Secondary | ICD-10-CM | POA: Diagnosis not present

## 2024-07-29 DIAGNOSIS — W19XXXA Unspecified fall, initial encounter: Secondary | ICD-10-CM | POA: Diagnosis not present

## 2024-07-29 DIAGNOSIS — S39012A Strain of muscle, fascia and tendon of lower back, initial encounter: Secondary | ICD-10-CM

## 2024-07-29 MED ORDER — CYCLOBENZAPRINE HCL 5 MG PO TABS
5.0000 mg | ORAL_TABLET | Freq: Every evening | ORAL | 0 refills | Status: DC | PRN
Start: 2024-07-29 — End: 2024-08-01

## 2024-07-29 MED ORDER — PREDNISONE 20 MG PO TABS: ORAL_TABLET | ORAL | 0 refills | Status: DC

## 2024-07-29 NOTE — Telephone Encounter (Signed)
 No samples

## 2024-07-29 NOTE — ED Triage Notes (Signed)
 Pt reports she tried to used her hands to move in the chair and when she put pressure on the chair arms,  the arms of the chair  fell on the floor and she twisted her back and injured the right shoulder today at work, around 0915 am. Hydrocodone  gives no relief.

## 2024-07-29 NOTE — Telephone Encounter (Signed)
 Called pt's insurance and they advised they're unable to do anything due to patient picking up the 7.5mg . Please advise

## 2024-07-29 NOTE — ED Provider Notes (Signed)
 [facility]  Note:  This document was prepared using Conservation officer, historic buildings and may include unintentional dictation errors.  MRN: [id number] DOB: 1970/10/29  Subjective:   Emily Maddox is a 53 y.o. female presenting for suffering a right shoulder and low back injury today while at work.  Patient reports that she slipped out of her chair and ended up twisting her back, absorbing impact onto her shoulder.  Has since had pain of her low back and shoulder.  Has a history of lumbar degenerative disc disease, bulging lumbar disc.  No history of back surgeries or shoulder injuries, shoulder surgeries.  No current facility-administered medications for this encounter.  Current Outpatient Medications:    ALPRAZolam  (XANAX ) 0.5 MG tablet, Take 1 tablet (0.5 mg total) by mouth at bedtime as needed for anxiety or sleep (for sleep test ,HST)., Disp: 2 tablet, Rfl: 0   Blood Glucose Monitoring Suppl (ONETOUCH VERIO FLEX SYSTEM) w/Device KIT, 1 each by Does not apply route daily. Dx code: E11.9, Disp: 1 kit, Rfl: 0   Continuous Glucose Sensor (FREESTYLE LIBRE 3 PLUS SENSOR) MISC, Change sensor every 15 days., Disp: 2 each, Rfl: 3   Cyanocobalamin  (B-12) 1000 MCG CAPS, , Disp: , Rfl:    Fezolinetant (VEOZAH) 45 MG TABS, Take 1 tablet (45 mg total) by mouth daily. (Patient not taking: No sig reported), Disp: 30 tablet, Rfl:    glucose blood (ONETOUCH VERIO) test strip, USE 1 STRIP TO CHECK GLUCOSE ONCE DAILY, Disp: 100 each, Rfl: 1   HYDROcodone -acetaminophen  (NORCO) 10-325 MG tablet, Take 1 tablet by mouth every 6 (six) hours as needed., Disp: 60 tablet, Rfl: 0   linaclotide  (LINZESS ) 290 MCG CAPS capsule, Take 1 capsule (290 mcg total) by mouth daily before breakfast., Disp: , Rfl:    Multiple Vitamin (MULTIVITAMIN) tablet, Take 1 tablet by mouth daily., Disp: , Rfl:    ondansetron  (ZOFRAN ) 8 MG tablet, Take 1 tablet (8 mg total) by mouth every 8 (eight) hours as needed  for nausea or vomiting. (Patient not taking: No sig reported), Disp: 20 tablet, Rfl: 0   ONETOUCH DELICA LANCETS 33G MISC, Check sugar every day.  Dx Code: E11.9, Disp: 100 each, Rfl: 1   Probiotic Product (PROBIOTIC DAILY PO), Take 1 capsule by mouth daily., Disp: , Rfl:    rosuvastatin  (CRESTOR ) 40 MG tablet, Take 1 tablet (40 mg total) by mouth at bedtime., Disp: 90 tablet, Rfl: 0   tirzepatide  (MOUNJARO ) 5 MG/0.5ML Pen, Inject 5 mg into the skin once a week., Disp: 6 mL, Rfl: 2   tirzepatide  (MOUNJARO ) 5 MG/0.5ML Pen, Inject 5 mg into the skin once a week., Disp: 6 mL, Rfl: 2   VITAMIN D  PO, Take 2,000 Units by mouth daily., Disp: , Rfl:    Vitamin D , Ergocalciferol , (DRISDOL ) 1.25 MG (50000 UNIT) CAPS capsule, Take 1 capsule by mouth once a week, Disp: 12 capsule, Rfl: 0   Allergies  Allergen Reactions   Metoprolol  Other (See Comments)    exhaustion   Iodinated Contrast Media    Latex    Shellfish Allergy    Ultram  [Tramadol ] Palpitations    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   Diabetes mellitus    Hyperlipidemia    Tachycardia      Past Surgical History:  Procedure Laterality Date   BREAST EXCISIONAL BIOPSY Left 2003    Family History  Problem Relation Age of Onset   Cancer Mother 28       breast   Hypertension Mother    Hyperlipidemia Mother    Diabetes Mother    Breast cancer Mother 59   Cancer Father 50       colon   Hypertension Father    Hyperlipidemia Father    Heart disease Father        a fib   Diabetes Father    Diabetes Sister    Breast cancer Other    Colon cancer Other     Social History   Tobacco Use   Smoking status: Every Day    Current packs/day: 1.00    Average packs/day: 1 pack/day for 19.0 years (19.0 ttl pk-yrs)    Types: Cigarettes   Smokeless tobacco: Never   Tobacco comments:    couldn't tolerate chantix, cut down to 0.5 pack since 11/2013   Vaping Use   Vaping status: Never Used  Substance Use Topics   Alcohol use: No   Drug use: No    ROS   Objective:   Vitals: BP (!) 142/98 (BP Location: Right Arm)   Pulse 86   Temp 97.9 F (36.6 C) (Oral)   Resp 18   SpO2 96%   Physical Exam Constitutional:      General: She is not in acute distress.    Appearance: Normal appearance. She is well-developed. She is not ill-appearing, toxic-appearing or diaphoretic.  HENT:     Head: Normocephalic and atraumatic.     Nose: Nose normal.     Mouth/Throat:     Mouth: Mucous membranes are moist.  Eyes:     General: No scleral icterus.       Right eye: No discharge.        Left eye: No discharge.     Extraocular Movements: Extraocular movements intact.  Cardiovascular:     Rate and Rhythm: Normal rate.  Pulmonary:     Effort: Pulmonary effort is normal.  Musculoskeletal:     Right shoulder: Tenderness and bony tenderness present. No swelling, deformity, effusion, laceration or crepitus. Normal range of motion. Normal strength.     Lumbar back: Spasms and tenderness present. No swelling, edema, deformity, signs of trauma, lacerations or bony tenderness. Decreased range of motion. Positive right straight leg raise test. Negative left straight leg raise test. No scoliosis.  Skin:    General: Skin is warm and dry.  Neurological:     General: No focal deficit present.     Mental Status: She is alert and oriented to person, place, and time.  Psychiatric:        Mood and Affect: Mood normal.        Behavior: Behavior normal.     Assessment and Plan :   PDMP not reviewed this encounter.  1. Lumbar strain, initial encounter   2. Right shoulder strain, initial encounter   3. Accidental fall, initial encounter    Given the underlying bulging disc, history of degenerative disc disease and general chronic conditions recommended prednisone for strong anti-inflammatory properties to help with her lumbar strain pain and right  shoulder strain pain.  Use cyclobenzaprine  as a muscle relaxant.  Follow-up with her orthopedist ASAP.  Deferred imaging given low suspicion for fracture, dislocation.  Counseled patient on potential for adverse effects with medications prescribed/recommended today, ER and return-to-clinic precautions discussed, patient verbalized understanding.    Christopher Savannah, [place] 07/29/24 [id number]

## 2024-07-30 ENCOUNTER — Ambulatory Visit: Payer: Self-pay

## 2024-07-30 NOTE — Telephone Encounter (Signed)
 FYI Only or Action Required?: FYI only for provider.  Patient was last seen in primary care on 06/19/2024 by Antonio Meth, Jamee SAUNDERS, DO.  Called Nurse Triage reporting Fall.  Symptoms began yesterday.  Interventions attempted: Prescription medications: Muscle relaxer and steroids .  Symptoms are: unchanged.  Triage Disposition: See PCP Within 2 Weeks  Patient/caregiver understands and will follow disposition?: Yes    Copied from CRM [id number]. Topic: Clinical - Red Word Triage >> Jul 30, 2024  2:27 PM Franky GRADE wrote: Red Word that prompted transfer to Nurse Triage: Patient suffered a fall while at work yesterday and is experiencing right side back and shoulder pain. Reason for Disposition  [1] Fall AND [2] went to emergency department for evaluation or treatment    Patient seen in UC for symptoms.  Answer Assessment - Initial Assessment Questions Seen in UC for symptoms. Taking steroids and muscle relaxer for symptoms.   1. MECHANISM: How did the injury happen?     Fall at work states she did not hit floor 2. ONSET: When did the injury happen? (e.g., minutes, hours ago)      Yesterday  3. APPEARANCE of INJURY: What does the injury look like?      Area has some mild swelling  4. SEVERITY: Can you move the shoulder normally?      Yes  5. SIZE: For cuts, bruises, or swelling, ask: How large is it? (e.g., inches or centimeters;  entire joint)      Denies  6. PAIN: Is there pain? If Yes, ask: How bad is the pain?  (Scale 0-10; or none, mild, moderate, severe)     6/10  7. TETANUS: For any breaks in the skin, ask: When was your last tetanus booster?     Denies  8. OTHER SYMPTOMS: Do you have any other symptoms? (e.g., loss of sensation)     Legs feel weak ( she states that is ongoing from the past)  Protocols used: Falls and [facility]

## 2024-07-30 NOTE — Telephone Encounter (Signed)
 Appt scheduled

## 2024-08-01 ENCOUNTER — Ambulatory Visit (INDEPENDENT_AMBULATORY_CARE_PROVIDER_SITE_OTHER): Payer: Self-pay | Admitting: Family Medicine

## 2024-08-01 ENCOUNTER — Encounter: Payer: Self-pay | Admitting: Family Medicine

## 2024-08-01 VITALS — BP 100/90 | HR 86 | Temp 98.4°F | Resp 18 | Ht 67.0 in | Wt 195.4 lb

## 2024-08-01 DIAGNOSIS — M5441 Lumbago with sciatica, right side: Secondary | ICD-10-CM

## 2024-08-01 DIAGNOSIS — Z23 Encounter for immunization: Secondary | ICD-10-CM

## 2024-08-01 MED ORDER — PREDNISONE 10 MG PO TABS: ORAL_TABLET | ORAL | 0 refills | Status: DC

## 2024-08-01 MED ORDER — CYCLOBENZAPRINE HCL 10 MG PO TABS: 10.0000 mg | ORAL_TABLET | Freq: Three times a day (TID) | ORAL | 0 refills | Status: DC | PRN

## 2024-08-01 NOTE — Progress Notes (Signed)
 Subjective:    Patient ID: Emily Maddox, female    DOB: 03-Jul-1971, 53 y.o.   MRN: [id number]  Chief Complaint  Patient presents with   Fall    HPI Patient is in today for f/u fall.  Discussed the use of AI scribe software for clinical note transcription with the patient, who gave verbal consent to proceed.  History of Present Illness Emily Maddox is a 53 year old female who presents with right-sided back pain and leg weakness after a fall.  She experienced right-sided back pain and leg weakness following an incident at work. Her legs felt weak and like rubber, making it difficult to walk from her car to her desk. While attempting to lift herself using her upper body strength, she twisted her back, resulting in pain. Although she did not fall, she recalls twisting her back during the incident.  She visited urgent care on July 29, 2024, where she was prescribed Flexeril  and prednisone. She has been taking these medications for two days but reports that the right-sided pain persists and is severe, radiating down her leg. The medication does not seem to be providing relief.  She has been out of her regular medication for a month due to insurance issues and has only been taking a 7.5 mg dose. She is concerned about resuming her medication after a month-long gap and plans to gradually increase her dose as tolerated.  Her blood sugar levels have been stable so far, despite taking prednisone.    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   Diabetes mellitus    Hyperlipidemia    Tachycardia     Past Surgical History:  Procedure Laterality Date   BREAST EXCISIONAL BIOPSY Left 2003    Family History  Problem Relation Age of Onset   Cancer Mother 84       breast   Hypertension Mother    Hyperlipidemia Mother    Diabetes Mother    Breast cancer Mother 105   Cancer Father 67       colon    Hypertension Father    Hyperlipidemia Father    Heart disease Father        a fib   Diabetes Father    Diabetes Sister    Breast cancer Other    Colon cancer Other     Social History   Socioeconomic History   Marital status: Single    Spouse name: Not on file   Number of children: Not on file   Years of education: Not on file   Highest education level: Bachelor's degree (e.g., BA, AB, BS)  Occupational History    Employer: [employer]   Occupation: [employer]  Tobacco Use   Smoking status: Every Day    Current packs/day: 1.00    Average packs/day: 1 pack/day for 19.0 years (19.0 ttl pk-yrs)    Types: Cigarettes   Smokeless tobacco: Never   Tobacco comments:    couldn't tolerate chantix, cut down to 0.5 pack since 11/2013  Vaping Use   Vaping status: Never Used  Substance and Sexual Activity   Alcohol use: No   Drug use: No   Sexual activity: Yes    Partners: Female  Other Topics Concern   Not on file  Social History Narrative   Lives alone.     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 in business administration   Exercise--no  R handed   Caffeine: 3 c of coffee a day   Social Drivers of Corporate investment banker Strain: Low Risk  (03/31/2021)   Overall Financial Resource Strain (CARDIA)    Difficulty of Paying Living Expenses: Not hard at all  Food Insecurity: No Food Insecurity (03/31/2021)   Hunger Vital Sign    Worried About Running Out of Food in the Last Year: Never true    Ran Out of Food in the Last Year: Never true  Transportation Needs: No Transportation Needs (03/31/2021)   PRAPARE - Administrator, Civil Service (Medical): No    Lack of Transportation (Non-Medical): No  Physical Activity: Sufficiently Active (03/31/2021)   Exercise Vital Sign    Days of Exercise per Week: 3 days    Minutes of Exercise per Session: 50 min  Stress: Stress Concern Present (03/31/2021)   [employer] of Occupational Health - Occupational Stress Questionnaire    Feeling of  Stress : To some extent  Social Connections: Moderately Isolated (03/31/2021)   Social Connection and Isolation Panel    Frequency of Communication with Friends and Family: More than three times a week    Frequency of Social Gatherings with Friends and Family: More than three times a week    Attends Religious Services: More than 4 times per year    Active Member of [employer] or Organizations: Not on file    Attends Banker Meetings: Never    Marital Status: Never married  Intimate Partner Violence: Not At Risk (03/31/2021)   Humiliation, Afraid, Rape, and Kick questionnaire    Fear of Current or Ex-Partner: No    Emotionally Abused: No    Physically Abused: No    Sexually Abused: No    Outpatient Medications Prior to Visit  Medication Sig Dispense Refill   ALPRAZolam  (XANAX ) 0.5 MG tablet Take 1 tablet (0.5 mg total) by mouth at bedtime as needed for anxiety or sleep (for sleep test ,HST). 2 tablet 0   Blood Glucose Monitoring Suppl (ONETOUCH VERIO FLEX SYSTEM) w/Device KIT 1 each by Does not apply route daily. Dx code: E11.9 1 kit 0   Continuous Glucose Sensor (FREESTYLE LIBRE 3 PLUS SENSOR) MISC Change sensor every 15 days. 2 each 3   Cyanocobalamin  (B-12) 1000 MCG CAPS      glucose blood (ONETOUCH VERIO) test strip USE 1 STRIP TO CHECK GLUCOSE ONCE DAILY 100 each 1   HYDROcodone -acetaminophen  (NORCO) 10-325 MG tablet Take 1 tablet by mouth every 6 (six) hours as needed. 60 tablet 0   linaclotide  (LINZESS ) 290 MCG CAPS capsule Take 1 capsule (290 mcg total) by mouth daily before breakfast.     Multiple Vitamin (MULTIVITAMIN) tablet Take 1 tablet by mouth daily.     ONETOUCH DELICA LANCETS 33G MISC Check sugar every day.  Dx Code: E11.9 100 each 1   Probiotic Product (PROBIOTIC DAILY PO) Take 1 capsule by mouth daily.     rosuvastatin  (CRESTOR ) 40 MG tablet Take 1 tablet (40 mg total) by mouth at bedtime. 90 tablet 0   tirzepatide  (MOUNJARO ) 5 MG/0.5ML Pen Inject 5 mg into the  skin once a week. 6 mL 2   tirzepatide  (MOUNJARO ) 5 MG/0.5ML Pen Inject 5 mg into the skin once a week. 6 mL 2   VITAMIN D  PO Take 2,000 Units by mouth daily.     Vitamin D , Ergocalciferol , (DRISDOL ) 1.25 MG (50000 UNIT) CAPS capsule Take 1 capsule by mouth once a week 12  capsule 0   cyclobenzaprine  (FLEXERIL ) 5 MG tablet Take 1 tablet (5 mg total) by mouth at bedtime as needed. 30 tablet 0   predniSONE (DELTASONE) 20 MG tablet Take 2 tablets daily with breakfast. 10 tablet 0   ondansetron  (ZOFRAN ) 8 MG tablet Take 1 tablet (8 mg total) by mouth every 8 (eight) hours as needed for nausea or vomiting. (Patient not taking: Reported on 08/01/2024) 20 tablet 0   Fezolinetant (VEOZAH) 45 MG TABS Take 1 tablet (45 mg total) by mouth daily. (Patient not taking: Reported on 08/01/2024) 30 tablet    No facility-administered medications prior to visit.    Allergies  Allergen Reactions   Metoprolol  Other (See Comments)    exhaustion   Iodinated Contrast Media    Latex    Shellfish Allergy    Ultram  [Tramadol ] Palpitations    Review of Systems  Constitutional:  Negative for chills, fever and malaise/fatigue.  HENT:  Negative for congestion and hearing loss.   Eyes:  Negative for discharge.  Respiratory:  Negative for cough, sputum production and shortness of breath.   Cardiovascular:  Negative for chest pain, palpitations and leg swelling.  Gastrointestinal:  Negative for abdominal pain, blood in stool, constipation, diarrhea, heartburn, nausea and vomiting.  Genitourinary:  Negative for dysuria, frequency, hematuria and urgency.  Musculoskeletal:  Negative for back pain, falls and myalgias.  Skin:  Negative for rash.  Neurological:  Negative for dizziness, sensory change, loss of consciousness, weakness and headaches.  Endo/Heme/Allergies:  Negative for environmental allergies. Does not bruise/bleed easily.  Psychiatric/Behavioral:  Negative for depression and suicidal ideas. The patient is  not nervous/anxious and does not have insomnia.        Objective:    Physical Exam Vitals and nursing note reviewed.  Constitutional:      General: She is not in acute distress.    Appearance: Normal appearance. She is well-developed.  HENT:     Head: Normocephalic and atraumatic.  Eyes:     General: No scleral icterus.       Right eye: No discharge.        Left eye: No discharge.  Cardiovascular:     Rate and Rhythm: Normal rate and regular rhythm.     Heart sounds: No murmur heard. Pulmonary:     Effort: Pulmonary effort is normal. No respiratory distress.     Breath sounds: Normal breath sounds.  Musculoskeletal:        General: Normal range of motion.     Cervical back: Normal range of motion and neck supple.     Right lower leg: No edema.     Left lower leg: No edema.  Skin:    General: Skin is warm and dry.  Neurological:     Mental Status: She is alert and oriented to person, place, and time.  Psychiatric:        Mood and Affect: Mood normal.        Behavior: Behavior normal.        Thought Content: Thought content normal.        Judgment: Judgment normal.     BP (!) 100/90 (BP Location: Left Arm, Patient Position: Sitting, Cuff Size: Large)   Pulse 86   Temp 98.4 F (36.9 C) (Oral)   Resp 18   Ht 5' 7 (1.702 m)   Wt 195 lb 6.4 oz (88.6 kg)   SpO2 99%   BMI 30.60 kg/m  Wt Readings from Last 3 Encounters:  08/01/24  195 lb 6.4 oz (88.6 kg)  06/19/24 192 lb 12.8 oz (87.5 kg)  05/29/24 196 lb (88.9 kg)    Diabetic Foot Exam - Simple   No data filed    Lab Results  Component Value Date   WBC 7.4 06/19/2024   HGB 14.0 06/19/2024   HCT 42.5 06/19/2024   PLT 308.0 06/19/2024   GLUCOSE 83 06/19/2024   CHOL 170 06/19/2024   TRIG 84.0 06/19/2024   HDL 53.80 06/19/2024   LDLDIRECT 143.7 11/12/2013   LDLCALC 100 (H) 06/19/2024   ALT 7 06/19/2024   AST 13 06/19/2024   NA 140 06/19/2024   K 4.5 06/19/2024   CL 102 06/19/2024   CREATININE 0.98  06/19/2024   BUN 9 06/19/2024   CO2 29 06/19/2024   TSH 0.83 01/25/2024   HGBA1C 6.5 06/19/2024   MICROALBUR 0.8 06/19/2024    Lab Results  Component Value Date   TSH 0.83 01/25/2024   Lab Results  Component Value Date   WBC 7.4 06/19/2024   HGB 14.0 06/19/2024   HCT 42.5 06/19/2024   MCV 97.1 06/19/2024   PLT 308.0 06/19/2024   Lab Results  Component Value Date   NA 140 06/19/2024   K 4.5 06/19/2024   CO2 29 06/19/2024   GLUCOSE 83 06/19/2024   BUN 9 06/19/2024   CREATININE 0.98 06/19/2024   BILITOT 0.3 06/19/2024   ALKPHOS 72 06/19/2024   AST 13 06/19/2024   ALT 7 06/19/2024   PROT 7.4 06/19/2024   ALBUMIN 4.4 06/19/2024   CALCIUM  9.2 06/19/2024   ANIONGAP 10 04/07/2018   GFR 66.68 06/19/2024   Lab Results  Component Value Date   CHOL 170 06/19/2024   Lab Results  Component Value Date   HDL 53.80 06/19/2024   Lab Results  Component Value Date   LDLCALC 100 (H) 06/19/2024   Lab Results  Component Value Date   TRIG 84.0 06/19/2024   Lab Results  Component Value Date   CHOLHDL 3 06/19/2024   Lab Results  Component Value Date   HGBA1C 6.5 06/19/2024       Assessment & Plan:  Acute right-sided low back pain with right-sided sciatica -     Cyclobenzaprine  HCl; Take 1 tablet (10 mg total) by mouth 3 (three) times daily as needed for muscle spasms.  Dispense: 30 tablet; Refill: 0 -     predniSONE; TAKE 3 TABLETS PO QD FOR 3 DAYS THEN TAKE 2 TABLETS PO QD FOR 3 DAYS THEN TAKE 1 TABLET PO QD FOR 3 DAYS THEN TAKE 1/2 TAB PO QD FOR 3 DAYS  Dispense: 20 tablet; Refill: 0  Need for influenza vaccination -     Flu vaccine trivalent PF, 6mos and older(Flulaval,Afluria,Fluarix,Fluzone)  Assessment and Plan Assessment & Plan Right-sided lumbar radiculopathy with back pain   Exacerbation occurred after a work-related incident involving back twisting. She reports leg weakness, numbness, and pain radiating down the right leg. Current Flexeril  and prednisone  regimen provides minimal relief. Increase Flexeril  to 10 mg at night or when at home, avoiding driving post-administration. Extend prednisone course after current supply. Monitor blood sugar levels due to potential increase from prednisone. Consider insulin if blood sugar exceeds 300 mg/dL.  Type 2 diabetes mellitus   Diabetes is well-managed, but prednisone use may cause hyperglycemia. Blood sugar levels have been stable, but there is a risk of elevation above 300 mg/dL. Monitor blood sugar closely while on prednisone. Consider insulin if blood sugar exceeds 300 mg/dL.  Corley Kohls R Lowne Chase, DO

## 2024-08-01 NOTE — Telephone Encounter (Signed)
 Pt has appt this afternoon

## 2024-08-06 ENCOUNTER — Other Ambulatory Visit: Payer: Self-pay | Admitting: Family Medicine

## 2024-08-06 DIAGNOSIS — Z1231 Encounter for screening mammogram for malignant neoplasm of breast: Secondary | ICD-10-CM

## 2024-08-07 ENCOUNTER — Ambulatory Visit

## 2024-08-11 ENCOUNTER — Ambulatory Visit
Admission: RE | Admit: 2024-08-11 | Discharge: 2024-08-11 | Disposition: A | Discharge status: Home / Self Care | Source: Ambulatory Visit | Attending: Family Medicine | Admitting: Family Medicine

## 2024-08-11 DIAGNOSIS — Z1231 Encounter for screening mammogram for malignant neoplasm of breast: Secondary | ICD-10-CM

## 2024-08-15 ENCOUNTER — Other Ambulatory Visit: Payer: Self-pay | Admitting: Family Medicine

## 2024-08-15 DIAGNOSIS — G894 Chronic pain syndrome: Secondary | ICD-10-CM

## 2024-08-15 DIAGNOSIS — M65941 Unspecified synovitis and tenosynovitis, right hand: Secondary | ICD-10-CM

## 2024-08-15 DIAGNOSIS — M797 Fibromyalgia: Secondary | ICD-10-CM

## 2024-08-15 MED ORDER — HYDROCODONE-ACETAMINOPHEN 10-325 MG PO TABS: 1.0000 | ORAL_TABLET | Freq: Four times a day (QID) | ORAL | 0 refills | Status: DC | PRN

## 2024-08-15 NOTE — Telephone Encounter (Signed)
 Requesting: hydrocodone  10-325mg  Contract: 03/18/24 UDS: 03/18/24 Last Visit: 08/01/24 Next Visit: None Last Refill: 07/17/24 #60 and 0RF   Please Advise

## 2024-08-15 NOTE — Telephone Encounter (Unsigned)
 Copied from CRM [id number]. Topic: Clinical - Medication Refill >> Aug 15, 2024 10:38 AM Franky GRADE wrote: Medication: HYDROcodone -acetaminophen  (NORCO) 10-325 MG tablet [[id number]]  Has the patient contacted their pharmacy? No (Agent: If no, request that the patient contact the pharmacy for the refill. If patient does not wish to contact the pharmacy document the reason why and proceed with request.) (Agent: If yes, when and what did the pharmacy advise?)  This is the patient's preferred pharmacy:  [id number] - [place], [place] - [address] [place] [address] Phone: [phone] Fax: [phone]   Is this the correct pharmacy for this prescription? Yes If no, delete pharmacy and type the correct one.   Has the prescription been filled recently? No  Is the patient out of the medication? No, she has enough to hold her over until Sunday.   Has the patient been seen for an appointment in the last year OR does the patient have an upcoming appointment? Yes  Can we respond through MyChart? Yes  Agent: Please be advised that Rx refills may take up to 3 business days. We ask that you follow-up with your pharmacy.

## 2024-08-25 ENCOUNTER — Encounter: Payer: Self-pay | Admitting: Radiology

## 2024-09-09 ENCOUNTER — Ambulatory Visit: Admitting: Neurology

## 2024-09-09 LAB — OPHTHALMOLOGY REPORT-SCANNED

## 2024-09-17 ENCOUNTER — Ambulatory Visit: Payer: Self-pay

## 2024-09-17 ENCOUNTER — Other Ambulatory Visit: Payer: Self-pay | Admitting: Family Medicine

## 2024-09-17 DIAGNOSIS — M65941 Unspecified synovitis and tenosynovitis, right hand: Secondary | ICD-10-CM

## 2024-09-17 DIAGNOSIS — G894 Chronic pain syndrome: Secondary | ICD-10-CM

## 2024-09-17 DIAGNOSIS — M797 Fibromyalgia: Secondary | ICD-10-CM

## 2024-09-17 MED ORDER — HYDROCODONE-ACETAMINOPHEN 10-325 MG PO TABS: 1.0000 | ORAL_TABLET | Freq: Four times a day (QID) | ORAL | 0 refills | Status: AC | PRN

## 2024-09-17 NOTE — Telephone Encounter (Signed)
 Appt scheduled

## 2024-09-17 NOTE — Telephone Encounter (Signed)
 FYI Only or Action Required?: FYI only for provider: appointment scheduled on 09/23/24.  Patient was last seen in primary care on 08/01/2024 by Antonio Meth, Jamee SAUNDERS, DO.  Called Nurse Triage reporting Pain.  Symptoms began several years ago.  Interventions attempted: Prescription medications: HYDROcodone -acetaminophen  (NORCO) 10-325 MG tablet.  Symptoms are: stable.  Triage Disposition: See PCP Within 2 Weeks  Patient/caregiver understands and will follow disposition?: Yes Reason for Disposition  Back pain is a chronic symptom (recurrent or ongoing AND present > 4 weeks)  Answer Assessment - Initial Assessment Questions Patient feels like the HYDROcodone -acetaminophen  (NORCO) 10-325 MG tablet is not working as well as it used to. Patient states its not new, but not getting better. Used to see pain management, described it as prison and would rather not.   1. ONSET: When did the pain begin? (e.g., minutes, hours, days)     On going  2. LOCATION: Where does it hurt? (upper, mid or lower back)     Back, feet  3. SEVERITY: How bad is the pain?  (e.g., Scale 1-10; mild, moderate, or severe)      7/10  4. PATTERN: Is the pain constant? (e.g., yes, no; constant, intermittent)      Comes and goes   5. RADIATION: Does the pain shoot into your legs or somewhere else?     Radiates all over the place  6. MEDICINES: What have you taken so far for the pain? (e.g., nothing, acetaminophen , NSAIDS)     HYDROcodone -acetaminophen  (NORCO) 10-325 MG tablet  7. NEUROLOGIC SYMPTOMS: Do you have any weakness, numbness, or problems with bowel/bladder control?     Partially incontinent, but think its a female issue from what she's been told  8. OTHER SYMPTOMS: Do you have any other symptoms? (e.g., fever, abdomen pain, burning with urination, blood in urine)       Weakness of legs, arms, fatigue, exhaustion  Protocols used: Back Pain-A-AH  Copied from CRM #[id number]. Topic:  Clinical - Red Word Triage >> Sep 17, 2024 10:06 AM Robinson DEL wrote: [facility] that prompted transfer to Nurse Triage: Chronic pain issues that's affecting daily activities, more personal and provider aware

## 2024-09-17 NOTE — Telephone Encounter (Signed)
 Requesting: hydrocodone  10-325mg   Contract: 05/18/23 UDS: 03/18/24 Last Visit: 08/01/24 Next Visit: 09/23/24 Last Refill: 08/15/24 #60 and 0RF   Please Advise

## 2024-09-17 NOTE — Telephone Encounter (Unsigned)
 Copied from CRM [id number]. Topic: Clinical - Medication Refill >> Sep 17, 2024 10:03 AM Robinson H wrote: Medication: HYDROcodone -acetaminophen  (NORCO) 10-325 MG tablet  Has the patient contacted their pharmacy? No, controlled substance (Agent: If no, request that the patient contact the pharmacy for the refill. If patient does not wish to contact the pharmacy document the reason why and proceed with request.) (Agent: If yes, when and what did the pharmacy advise?)  This is the patient's preferred pharmacy:  [id number] - [place], [place] - [address] [place] [address] Phone: [phone] Fax: [phone]    Is this the correct pharmacy for this prescription? Yes If no, delete pharmacy and type the correct one.   Has the prescription been filled recently? No  Is the patient out of the medication? Yes  Has the patient been seen for an appointment in the last year OR does the patient have an upcoming appointment? Yes  Can we respond through MyChart? Yes  Agent: Please be advised that Rx refills may take up to 3 business days. We ask that you follow-up with your pharmacy.

## 2024-09-23 ENCOUNTER — Ambulatory Visit: Admitting: Family Medicine

## 2024-09-23 ENCOUNTER — Encounter: Payer: Self-pay | Admitting: Family Medicine

## 2024-09-23 VITALS — BP 128/86 | HR 92 | Temp 98.4°F | Resp 12 | Ht 67.0 in | Wt 195.8 lb

## 2024-09-23 DIAGNOSIS — G4709 Other insomnia: Secondary | ICD-10-CM | POA: Diagnosis not present

## 2024-09-23 DIAGNOSIS — G894 Chronic pain syndrome: Secondary | ICD-10-CM | POA: Diagnosis not present

## 2024-09-23 DIAGNOSIS — E1169 Type 2 diabetes mellitus with other specified complication: Secondary | ICD-10-CM

## 2024-09-23 DIAGNOSIS — Z79899 Other long term (current) drug therapy: Secondary | ICD-10-CM | POA: Diagnosis not present

## 2024-09-23 DIAGNOSIS — E785 Hyperlipidemia, unspecified: Secondary | ICD-10-CM

## 2024-09-23 DIAGNOSIS — L659 Nonscarring hair loss, unspecified: Secondary | ICD-10-CM | POA: Insufficient documentation

## 2024-09-23 MED ORDER — ROSUVASTATIN CALCIUM 40 MG PO TABS: 40.0000 mg | ORAL_TABLET | Freq: Every day | ORAL | 0 refills | Status: AC

## 2024-09-23 NOTE — Progress Notes (Signed)
 Subjective:    Patient ID: Emily Maddox, female    DOB: 1971/02/25, 53 y.o.   MRN: [id number]  No chief complaint on file.   HPI Patient is in today for fmla and update pain.  Discussed the use of AI scribe software for clinical note transcription with the patient, who gave verbal consent to proceed.  History of Present Illness Emily Maddox is a 53 year old female who presents with hair loss and work-related stress.  She is experiencing significant hair loss, including a receding hairline and thinning at the back of her head, which she attributes to hereditary factors, as her mother had similar issues. She has not yet consulted a dermatologist and is considering a prosthetic hairpiece, which would involve shaving her remaining hair and adhering a wig-like piece to her scalp. She is concerned about the cost and maintenance of this option.  She is under significant work-related stress, impacting her mental health. She has intermittent FMLA leave but is worried about exhausting her paid time off, which would lead to leave without pay. 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 She feels exhausted and 'ready to crash out,' indicating high stress and fatigue.  She mentions a history of a fall in October, which may be contributing to right-sided pain. She is unsure if this is related to the fall or a flare-up of a pre-existing condition. She requests documentation of this concern as she feels it is not being adequately addressed by her employer.  She is currently taking ezetimibe  and rosuvastatin  for cholesterol management and requests refills for these medications. She is aware that some cholesterol medications can cause hair loss but does not believe this is the cause of her current hair issues.  She reports feeling exhausted and in a mental fog upon waking, which lasts for about an hour or two. No issues with her blood sugar levels, which she monitors  regularly.    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   Diabetes mellitus    Hyperlipidemia    Tachycardia     Past Surgical History:  Procedure Laterality Date   BREAST EXCISIONAL BIOPSY Left 2003    Family History  Problem Relation Age of Onset   Cancer Mother 59       breast   Hypertension Mother    Hyperlipidemia Mother    Diabetes Mother    Breast cancer Mother 4   Cancer Father 28       colon   Hypertension Father    Hyperlipidemia Father    Heart disease Father        a fib   Diabetes Father    Diabetes Sister    Breast cancer Other    Colon cancer Other     Social History   Socioeconomic History   Marital status: Single    Spouse name: Not on file   Number of children: Not on file   Years of education: Not on file   Highest education level: Bachelor's degree (e.g., BA, AB, BS)  Occupational History    Employer: [employer]   Occupation: irs  Tobacco Use   Smoking status: Every Day    Current packs/day: 1.00    Average packs/day: 1 pack/day for 19.0 years (19.0 ttl pk-yrs)    Types: Cigarettes   Smokeless tobacco: Never   Tobacco comments:    couldn't tolerate chantix,  cut down to 0.5 pack since 11/2013  Vaping Use   Vaping status: Never Used  Substance and Sexual Activity   Alcohol use: No   Drug use: No   Sexual activity: Yes    Partners: Female  Other Topics Concern   Not on file  Social History Narrative   Lives alone.     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 in business administration   Exercise--no     R handed   Caffeine: 3 c of coffee a day   Social Drivers of Corporate Investment Banker Strain: Low Risk  (03/31/2021)   Overall Financial Resource Strain (CARDIA)    Difficulty of Paying Living Expenses: Not hard at all  Food Insecurity: No Food Insecurity (03/31/2021)   Hunger Vital Sign    Worried About Running Out of Food in the Last Year: Never true     Ran Out of Food in the Last Year: Never true  Transportation Needs: No Transportation Needs (03/31/2021)   PRAPARE - Administrator, Civil Service (Medical): No    Lack of Transportation (Non-Medical): No  Physical Activity: Sufficiently Active (03/31/2021)   Exercise Vital Sign    Days of Exercise per Week: 3 days    Minutes of Exercise per Session: 50 min  Stress: Stress Concern Present (03/31/2021)   [employer] of Occupational Health - Occupational Stress Questionnaire    Feeling of Stress : To some extent  Social Connections: Moderately Isolated (03/31/2021)   Social Connection and Isolation Panel    Frequency of Communication with Friends and Family: More than three times a week    Frequency of Social Gatherings with Friends and Family: More than three times a week    Attends Religious Services: More than 4 times per year    Active Member of [employer] or Organizations: Not on file    Attends Banker Meetings: Never    Marital Status: Never married  Intimate Partner Violence: Not At Risk (03/31/2021)   Humiliation, Afraid, Rape, and Kick questionnaire    Fear of Current or Ex-Partner: No    Emotionally Abused: No    Physically Abused: No    Sexually Abused: No    Outpatient Medications Prior to Visit  Medication Sig Dispense Refill   Blood Glucose Monitoring Suppl (ONETOUCH VERIO FLEX SYSTEM) w/Device KIT 1 each by Does not apply route daily. Dx code: E11.9 1 kit 0   glucose blood (ONETOUCH VERIO) test strip USE 1 STRIP TO CHECK GLUCOSE ONCE DAILY 100 each 1   HYDROcodone -acetaminophen  (NORCO) 10-325 MG tablet Take 1 tablet by mouth every 6 (six) hours as needed. 60 tablet 0   linaclotide  (LINZESS ) 290 MCG CAPS capsule Take 1 capsule (290 mcg total) by mouth daily before breakfast.     Multiple Vitamin (MULTIVITAMIN) tablet Take 1 tablet by mouth daily.     ONETOUCH DELICA LANCETS 33G MISC Check sugar every day.  Dx Code: E11.9 100 each 1   tirzepatide   (MOUNJARO ) 5 MG/0.5ML Pen Inject 5 mg into the skin once a week. 6 mL 2   Vitamin D , Ergocalciferol , (DRISDOL ) 1.25 MG (50000 UNIT) CAPS capsule Take 1 capsule by mouth once a week 12 capsule 0   rosuvastatin  (CRESTOR ) 40 MG tablet Take 1 tablet (40 mg total) by mouth at bedtime. 90 tablet 0   VITAMIN D  PO Take 2,000 Units by mouth daily. (Patient not taking: Reported on 09/23/2024)     ALPRAZolam  (XANAX ) 0.5 MG tablet  Take 1 tablet (0.5 mg total) by mouth at bedtime as needed for anxiety or sleep (for sleep test ,HST). 2 tablet 0   Continuous Glucose Sensor (FREESTYLE LIBRE 3 PLUS SENSOR) MISC Change sensor every 15 days. 2 each 3   Cyanocobalamin  (B-12) 1000 MCG CAPS      cyclobenzaprine  (FLEXERIL ) 10 MG tablet Take 1 tablet (10 mg total) by mouth 3 (three) times daily as needed for muscle spasms. 30 tablet 0   ondansetron  (ZOFRAN ) 8 MG tablet Take 1 tablet (8 mg total) by mouth every 8 (eight) hours as needed for nausea or vomiting. (Patient not taking: Reported on 08/01/2024) 20 tablet 0   predniSONE  (DELTASONE ) 10 MG tablet TAKE 3 TABLETS PO QD FOR 3 DAYS THEN TAKE 2 TABLETS PO QD FOR 3 DAYS THEN TAKE 1 TABLET PO QD FOR 3 DAYS THEN TAKE 1/2 TAB PO QD FOR 3 DAYS 20 tablet 0   Probiotic Product (PROBIOTIC DAILY PO) Take 1 capsule by mouth daily.     tirzepatide  (MOUNJARO ) 5 MG/0.5ML Pen Inject 5 mg into the skin once a week. 6 mL 2   No facility-administered medications prior to visit.    Allergies  Allergen Reactions   Metoprolol  Other (See Comments)    exhaustion   Iodinated Contrast Media    Latex    Shellfish Allergy    Ultram  [Tramadol ] Palpitations    Review of Systems  Constitutional:  Negative for chills, fever and malaise/fatigue.  HENT:  Negative for congestion and hearing loss.   Eyes:  Negative for discharge.  Respiratory:  Negative for cough, sputum production and shortness of breath.   Cardiovascular:  Negative for chest pain, palpitations and leg swelling.   Gastrointestinal:  Negative for abdominal pain, blood in stool, constipation, diarrhea, heartburn, nausea and vomiting.  Genitourinary:  Negative for dysuria, frequency, hematuria and urgency.  Musculoskeletal:  Negative for back pain, falls and myalgias.  Skin:  Negative for rash.  Neurological:  Negative for dizziness, sensory change, loss of consciousness, weakness and headaches.  Endo/Heme/Allergies:  Negative for environmental allergies. Does not bruise/bleed easily.  Psychiatric/Behavioral:  Negative for depression and suicidal ideas. The patient is not nervous/anxious and does not have insomnia.        Objective:    Physical Exam Vitals and nursing note reviewed.  Constitutional:      General: She is not in acute distress.    Appearance: Normal appearance. She is well-developed.  HENT:     Head: Normocephalic and atraumatic. Hair is abnormal.     Comments: Balding on sides and top of head Eyes:     General: No scleral icterus.       Right eye: No discharge.        Left eye: No discharge.  Cardiovascular:     Rate and Rhythm: Normal rate and regular rhythm.     Heart sounds: No murmur heard. Pulmonary:     Effort: Pulmonary effort is normal. No respiratory distress.     Breath sounds: Normal breath sounds.  Musculoskeletal:        General: Normal range of motion.     Cervical back: Normal range of motion and neck supple.     Right lower leg: No edema.     Left lower leg: No edema.  Skin:    General: Skin is warm and dry.  Neurological:     Mental Status: She is alert and oriented to person, place, and time.  Psychiatric:  Mood and Affect: Mood normal.        Behavior: Behavior normal.        Thought Content: Thought content normal.        Judgment: Judgment normal.     BP 128/86 (BP Location: Left Arm, Patient Position: Sitting, Cuff Size: Normal)   Pulse 92   Temp 98.4 F (36.9 C) (Oral)   Resp 12   Ht 5' 7 (1.702 m)   Wt 195 lb 12.8 oz (88.8 kg)    SpO2 97%   BMI 30.67 kg/m  Wt Readings from Last 3 Encounters:  09/23/24 195 lb 12.8 oz (88.8 kg)  08/01/24 195 lb 6.4 oz (88.6 kg)  06/19/24 192 lb 12.8 oz (87.5 kg)    Diabetic Foot Exam - Simple   No data filed    Lab Results  Component Value Date   WBC 7.4 06/19/2024   HGB 14.0 06/19/2024   HCT 42.5 06/19/2024   PLT 308.0 06/19/2024   GLUCOSE 83 06/19/2024   CHOL 170 06/19/2024   TRIG 84.0 06/19/2024   HDL 53.80 06/19/2024   LDLDIRECT 143.7 11/12/2013   LDLCALC 100 (H) 06/19/2024   ALT 7 06/19/2024   AST 13 06/19/2024   NA 140 06/19/2024   K 4.5 06/19/2024   CL 102 06/19/2024   CREATININE 0.98 06/19/2024   BUN 9 06/19/2024   CO2 29 06/19/2024   TSH 0.83 01/25/2024   HGBA1C 6.5 06/19/2024   MICROALBUR 0.8 06/19/2024    Lab Results  Component Value Date   TSH 0.83 01/25/2024   Lab Results  Component Value Date   WBC 7.4 06/19/2024   HGB 14.0 06/19/2024   HCT 42.5 06/19/2024   MCV 97.1 06/19/2024   PLT 308.0 06/19/2024   Lab Results  Component Value Date   NA 140 06/19/2024   K 4.5 06/19/2024   CO2 29 06/19/2024   GLUCOSE 83 06/19/2024   BUN 9 06/19/2024   CREATININE 0.98 06/19/2024   BILITOT 0.3 06/19/2024   ALKPHOS 72 06/19/2024   AST 13 06/19/2024   ALT 7 06/19/2024   PROT 7.4 06/19/2024   ALBUMIN 4.4 06/19/2024   CALCIUM  9.2 06/19/2024   ANIONGAP 10 04/07/2018   GFR 66.68 06/19/2024   Lab Results  Component Value Date   CHOL 170 06/19/2024   Lab Results  Component Value Date   HDL 53.80 06/19/2024   Lab Results  Component Value Date   LDLCALC 100 (H) 06/19/2024   Lab Results  Component Value Date   TRIG 84.0 06/19/2024   Lab Results  Component Value Date   CHOLHDL 3 06/19/2024   Lab Results  Component Value Date   HGBA1C 6.5 06/19/2024       Assessment & Plan:  Chronic pain syndrome -     PRESCRIBED DRUGS,MEDMATCH(R) -     DRUG MONITORING, PANEL 8 WITH CONFIRMATION, URINE  Other insomnia -     PRESCRIBED  DRUGS,MEDMATCH(R) -     DRUG MONITORING, PANEL 8 WITH CONFIRMATION, URINE  High risk medication use -     PRESCRIBED DRUGS,MEDMATCH(R) -     DRUG MONITORING, PANEL 8 WITH CONFIRMATION, URINE  Hyperlipidemia associated with type 2 diabetes mellitus (HCC) -     Rosuvastatin  Calcium ; Take 1 tablet (40 mg total) by mouth at bedtime.  Dispense: 90 tablet; Refill: 0  Alopecia  Assessment and Plan Assessment & Plan Chronic pain syndrome   Chronic pain is primarily on the right side, possibly related to a  fall in October or a flare-up of an existing condition. Pain management is ongoing.  Type 2 diabetes mellitus with hyperlipidemia   Type 2 diabetes mellitus with hyperlipidemia is managed with atorvastatin . Blood sugars are well-controlled. No need for labs until February. Refilled atorvastatin  prescription.  Alopecia   She has a receding hairline and hair loss. Considering prosthetic hair options. No current dermatological evaluation. Discussed potential insurance coverage for prosthetic hair if diagnosed with alopecia.  Insomnia   Chronic insomnia with difficulty waking up and feeling foggy in the morning.    Leith Hedlund R Lowne Chase, DO

## 2024-09-26 LAB — PRESCRIBED DRUGS,MEDMATCH(R)

## 2024-09-26 LAB — DRUG MONITORING, PANEL 8 WITH CONFIRMATION, URINE
6 Acetylmorphine: NEGATIVE ng/mL (ref <10)
Alcohol Metabolites: NEGATIVE ng/mL (ref <500)
Amphetamines: NEGATIVE ng/mL (ref <500)
Benzodiazepines: NEGATIVE ng/mL (ref <100)
Buprenorphine, Urine: NEGATIVE ng/mL (ref <5)
Cocaine Metabolite: NEGATIVE ng/mL (ref <150)
Codeine: NEGATIVE ng/mL (ref <50)
Creatinine: 94.9 mg/dL (ref >=20.0)
Hydrocodone: 391 ng/mL — ABNORMAL HIGH (ref <50)
Hydromorphone: 261 ng/mL — ABNORMAL HIGH (ref <50)
MDMA: NEGATIVE ng/mL (ref <500)
Marijuana Metabolite: NEGATIVE ng/mL (ref <20)
Morphine: NEGATIVE ng/mL (ref <50)
Norhydrocodone: 767 ng/mL — ABNORMAL HIGH (ref <50)
Opiates: POSITIVE ng/mL — AB (ref <100)
Oxidant: NEGATIVE ug/mL (ref <200)
Oxycodone: NEGATIVE ng/mL (ref <100)
pH: 7.3 (ref 4.5–9.0)

## 2024-09-26 LAB — DM TEMPLATE

## 2024-09-28 ENCOUNTER — Other Ambulatory Visit: Payer: Self-pay | Admitting: Family Medicine

## 2024-10-02 ENCOUNTER — Encounter: Payer: Self-pay | Admitting: Family Medicine

## 2024-10-02 NOTE — Telephone Encounter (Signed)
Okay to complete?

## 2024-10-06 NOTE — Telephone Encounter (Signed)
 Form printed placed in folder

## 2024-10-10 ENCOUNTER — Other Ambulatory Visit: Payer: Self-pay | Admitting: Family Medicine

## 2024-10-10 DIAGNOSIS — M797 Fibromyalgia: Secondary | ICD-10-CM

## 2024-10-10 DIAGNOSIS — M65942 Unspecified synovitis and tenosynovitis, left hand: Secondary | ICD-10-CM

## 2024-10-10 DIAGNOSIS — G894 Chronic pain syndrome: Secondary | ICD-10-CM

## 2024-10-10 MED ORDER — HYDROCODONE-ACETAMINOPHEN 10-325 MG PO TABS: 1.0000 | ORAL_TABLET | Freq: Four times a day (QID) | ORAL | 0 refills | Status: DC | PRN

## 2024-10-10 NOTE — Telephone Encounter (Signed)
 Requesting: hydrocodone   Contract:04/09/24 UDS:09/23/24 Last Visit: 09/23/24 Next Visit: None Last Refill: 09/17/24 #60 and 0RF  Please Advise

## 2024-10-10 NOTE — Telephone Encounter (Signed)
 Copied from CRM #[id number]. Topic: Clinical - Medication Refill >> Oct 10, 2024  9:53 AM Emylou G wrote: Medication: HYDROcodone -acetaminophen  (NORCO) 10-325 MG tablet  Has the patient contacted their pharmacy? No (Agent: If no, request that the patient contact the pharmacy for the refill. If patient does not wish to contact the pharmacy document the reason why and proceed with request.) (Agent: If yes, when and what did the pharmacy advise?)  This is the patient's preferred pharmacy:  [id number] - [place], [place] - [address] [place] [address] Phone: [phone] Fax: [phone]   Is this the correct pharmacy for this prescription? Yes If no, delete pharmacy and type the correct one.   Has the prescription been filled recently? No  Is the patient out of the medication? No  Has the patient been seen for an appointment in the last year OR does the patient have an upcoming appointment? Yes  Can we respond through MyChart? Yes  Agent: Please be advised that Rx refills may take up to 3 business days. We ask that you follow-up with your pharmacy.

## 2024-10-29 ENCOUNTER — Other Ambulatory Visit: Payer: Self-pay

## 2024-10-29 ENCOUNTER — Ambulatory Visit: Admitting: Surgical

## 2024-10-29 ENCOUNTER — Encounter: Payer: Self-pay | Admitting: Surgical

## 2024-10-29 DIAGNOSIS — M545 Low back pain, unspecified: Secondary | ICD-10-CM | POA: Diagnosis not present

## 2024-10-29 DIAGNOSIS — M5416 Radiculopathy, lumbar region: Secondary | ICD-10-CM

## 2024-10-29 MED ORDER — DIAZEPAM 5 MG PO TABS: 5.0000 mg | ORAL_TABLET | Freq: Once | ORAL | 0 refills | Status: AC

## 2024-10-29 NOTE — Addendum Note (Signed)
 Addended by: SHIRLY HERLENE CROME on: 10/29/2024 01:44 PM   Modules accepted: Orders

## 2024-10-29 NOTE — Progress Notes (Signed)
 "  Office Visit Note   Patient: Emily Maddox           Date of Birth: [birth year]           MRN: [id number] Visit Date: 10/29/2024 Requested by: Antonio Meth, [place], [place] [address] [place],  [place] [address] PCP: Antonio Meth, Jamee SAUNDERS, DO  Subjective: Chief Complaint  Patient presents with   low back pain    OTJI 07/29/2024-work comp has been denied thus far.     HPI: Emily Maddox is a 54 y.o. female who presents to the office reporting low back pain and bilateral leg radicular pain.  Patient states that she has history of degenerative disc disease with prior MRI from about 10 years ago.  She had chronic back pain and occasional radicular leg pain that was made worse since injury sustained on the job on 07/29/2024.  She was sitting in a chair and due to her leg weakness, had to push up with her arms.  The right chair arm broke and she fell to the right side and had to catch herself and stabilize herself and she felt a torquing pain in her low back on the right side.  Since then she has noticed increased right sided low back pain and radicular leg pain that primarily affects the anterior and posterior aspects of her right thigh but does extend down as low as the lateral aspect of the right foot at times.  She has similar distribution of pain in the left leg but it is not as severe.  Back pain bothers her more than her leg pain.  She has had prior physical therapy for her low back as recently as 2024 that did not really provide much relief.  She has also had prior lumbar spine ESI's most recently around 2015 that did not provide relief of her symptoms and gave her some uncomfortable side effects such as heart palpitations and increased menstrual flow that lasted for about 45 days according to her.  She denies any new incontinence but she does have chronic urinary incontinence over the last 2 to 3 years that she attributes to uterine fibroid that puts pressure on her bladder.  She  has to wear adult diapers due to severe urge incontinence.  No fecal incontinence.  She also has anesthesia of the right medial groin but no left sided numbness.  This has been chronic for her for about 1 to 2 years.  She does have history of 1 pack/day smoking of cigarettes for about 30 years.  She has history of diabetes that is overall controlled with A1c around 6.2.  She also says she has history of fibromyalgia..                ROS: All systems reviewed are negative as they relate to the chief complaint within the history of present illness.  Patient denies fevers or chills.  Assessment & Plan: Visit Diagnoses:  1. Acute bilateral low back pain, unspecified whether sciatica present     Plan: Impression is 54 year old female who presents for evaluation of chronic low back pain that has been made worse since injury on the job on 07/29/2024.  She has increased back pain and right leg radicular pain with subjective weakness but no gross weakness demonstrated on exam today fortunately.  Radiographs of lumbar spine demonstrate AP and lateral views of the lumbar spine with no significant spinal listhesis or fracture.  There is some  mild to moderate degenerative changes of multiple facet joints.  No significant loss of disc space at any level.  Plan at this time is MRI of lumbar spine to evaluate worsening bilateral leg radiculopathy right greater than left as well as her chronic back pain.  Follow-up after MRI to review results.  Follow-Up Instructions: Return for Review CT/MRI scan.   Orders:  Orders Placed This Encounter  Procedures   DG Lumbar Spine 2-3 Views   No orders of the defined types were placed in this encounter.     Procedures: No procedures performed   Clinical Data: No additional findings.  Objective: Vital Signs: There were no vitals taken for this visit.  Physical Exam:  Constitutional: Patient appears well-developed HEENT:  Head: Normocephalic Eyes:EOM are  normal Neck: Normal range of motion Cardiovascular: Normal rate Pulmonary/chest: Effort normal Neurologic: Patient is alert Skin: Skin is warm Psychiatric: Patient has normal mood and affect  Ortho Exam: Ortho exam demonstrates intact hip flexion, quadricep, hamstring, dorsiflexion, plantarflexion, EHL strength rated 5/5 bilaterally.  2+ patellar tendon reflexes bilaterally.  No clonus noted bilaterally.  Positive straight leg raise on the right reproducing thigh pain.  Negative straight leg raise on left.  No pain with hip range of motion.  No tenderness over the SI joints bilaterally.  Does have some mild to moderate tenderness throughout the axial lumbar spine and right sided paraspinal musculature.  Palpable DP pulse of bilateral lower extremities.  Specialty Comments:  No specialty comments available.  Imaging: No results found.   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   At risk for central sleep apnea 03/06/2022   Type 2 diabetes mellitus with hyperglycemia, without long-term current use of insulin (HCC) 05/31/2020   Educated about COVID-19 virus infection 03/23/2020   Influenza 11/05/2018   Hyperglycemia 08/29/2018   Essential hypertension 08/29/2018   Myalgia 05/10/2018   DM (diabetes mellitus) type II uncontrolled, periph vascular disorder 05/10/2018   Hyperlipidemia associated with type 2 diabetes mellitus (HCC) 05/10/2018   B12 deficiency 05/10/2018   Chronic pain syndrome 05/10/2018   High risk medication use 05/10/2018   Snoring 03/26/2018   Nocturia more than twice per night 03/26/2018   Insomnia 01/28/2018    Preventative health care 03/06/2017   Fibromyalgia 07/26/2015   Bulging lumbar disc 05/22/2013   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   Hiatal hernia 08/22/2010   Fibroids 09/03/2009   Diabetes mellitus type II, controlled (HCC) 11/27/2008   Hyperlipidemia 11/27/2008   TOBACCO ABUSE 11/25/2008   Lumbar degenerative disc disease 11/25/2008   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   Diabetes mellitus    Hyperlipidemia    Tachycardia     Family History  Problem Relation Age of Onset   Cancer Mother 62       breast   Hypertension Mother    Hyperlipidemia Mother    Diabetes Mother    Breast cancer Mother 56   Cancer Father 35       colon   Hypertension Father    Hyperlipidemia Father    Heart disease Father  a fib   Diabetes Father    Diabetes Sister    Breast cancer Other    Colon cancer Other     Past Surgical History:  Procedure Laterality Date   BREAST EXCISIONAL BIOPSY Left 2003   Social History   Occupational History    Employer: [employer]   Occupation: irs  Tobacco Use   Smoking status: Every Day    Current packs/day: 1.00    Average packs/day: 1 pack/day for 19.0 years (19.0 ttl pk-yrs)    Types: Cigarettes   Smokeless tobacco: Never   Tobacco comments:    couldn't tolerate chantix, cut down to 0.5 pack since 11/2013  Vaping Use   Vaping status: Never Used  Substance and Sexual Activity   Alcohol use: No   Drug use: No   Sexual activity: Yes    Partners: Female        "

## 2024-10-29 NOTE — Addendum Note (Signed)
 Addended by: TRINDA DEANE HERO on: 10/29/2024 01:04 PM   Modules accepted: Orders

## 2024-11-03 ENCOUNTER — Telehealth: Payer: Self-pay | Admitting: Surgical

## 2024-11-03 NOTE — Telephone Encounter (Signed)
 Pt request disc of xrays done on 10/29/24 at the [place] office.

## 2024-11-04 NOTE — Telephone Encounter (Signed)
 Done

## 2024-11-09 ENCOUNTER — Ambulatory Visit (HOSPITAL_COMMUNITY)
Admission: RE | Admit: 2024-11-09 | Discharge: 2024-11-09 | Disposition: A | Discharge status: Home / Self Care | Source: Ambulatory Visit | Attending: Surgical | Admitting: Surgical

## 2024-11-09 DIAGNOSIS — M5416 Radiculopathy, lumbar region: Secondary | ICD-10-CM | POA: Insufficient documentation

## 2024-11-09 DIAGNOSIS — M545 Low back pain, unspecified: Secondary | ICD-10-CM | POA: Insufficient documentation

## 2024-11-10 ENCOUNTER — Ambulatory Visit: Payer: Self-pay | Admitting: Family Medicine

## 2024-11-10 ENCOUNTER — Telehealth: Payer: Self-pay | Admitting: Family Medicine

## 2024-11-10 ENCOUNTER — Other Ambulatory Visit: Payer: Self-pay | Admitting: Family Medicine

## 2024-11-10 DIAGNOSIS — M65942 Unspecified synovitis and tenosynovitis, left hand: Secondary | ICD-10-CM

## 2024-11-10 DIAGNOSIS — G894 Chronic pain syndrome: Secondary | ICD-10-CM

## 2024-11-10 DIAGNOSIS — M797 Fibromyalgia: Secondary | ICD-10-CM

## 2024-11-10 MED ORDER — HYDROCODONE-ACETAMINOPHEN 10-325 MG PO TABS: 1.0000 | ORAL_TABLET | Freq: Four times a day (QID) | ORAL | 0 refills | Status: AC | PRN

## 2024-11-10 NOTE — Telephone Encounter (Signed)
 How long of an extension would you like to give?

## 2024-11-10 NOTE — Telephone Encounter (Signed)
 Pt just had one in December

## 2024-11-10 NOTE — Telephone Encounter (Signed)
 Copied from CRM #[id number]. Topic: Medical Record Request - Records Request >> Nov 10, 2024  9:24 AM Wyona P wrote: Reason for CRM: PT called in stating she needs note for work.   FMLA end Feb 9th and needs paperwork for extension. Says provider is aware.   Please follow up with Pt

## 2024-11-10 NOTE — Telephone Encounter (Signed)
 FYI Only or Action Required?: Action required by provider: update on patient condition.  Patient was last seen in primary care on 09/23/2024 by Antonio Meth, Jamee SAUNDERS, DO.  Called Nurse Triage reporting Results.  Symptoms began several days ago.  Interventions attempted: OTC medications: pain med.  Symptoms are: stable.  Triage Disposition: Call PCP Within 24 Hours  Patient/caregiver understands and will follow disposition?: Yes     Reason for Triage: Accident in october. pt already had problems previously and the fall in october is making things worse   wants refill on Hydrocodone .  Reason for Disposition  Caller requesting lab results  (Exception: Routine or non-urgent lab result.)  Answer Assessment - Initial Assessment Questions 1. REASON FOR CALL or QUESTION: What is your reason for calling today? or How can I best     Wants dicuss results of MRI, opinion if incident in October is making this work. Has ortho appt coming up.  Needs FMLA MD note. And refill of hydrocodone .  2. CALLER: Document the source of call. (e.g., laboratory staff, caregiver or patient).     patient  Protocols used: PCP Call - No Triage-A-AH

## 2024-11-18 ENCOUNTER — Encounter: Payer: Self-pay | Admitting: Family Medicine

## 2024-11-18 ENCOUNTER — Ambulatory Visit: Admitting: Family Medicine

## 2024-11-18 VITALS — BP 118/82 | HR 98 | Ht 67.0 in | Wt 191.6 lb

## 2024-11-18 DIAGNOSIS — E559 Vitamin D deficiency, unspecified: Secondary | ICD-10-CM

## 2024-11-18 DIAGNOSIS — G4709 Other insomnia: Secondary | ICD-10-CM

## 2024-11-18 DIAGNOSIS — E1169 Type 2 diabetes mellitus with other specified complication: Secondary | ICD-10-CM

## 2024-11-18 DIAGNOSIS — E538 Deficiency of other specified B group vitamins: Secondary | ICD-10-CM

## 2024-11-18 DIAGNOSIS — I10 Essential (primary) hypertension: Secondary | ICD-10-CM | POA: Diagnosis not present

## 2024-11-18 DIAGNOSIS — M797 Fibromyalgia: Secondary | ICD-10-CM | POA: Diagnosis not present

## 2024-11-18 DIAGNOSIS — M51369 Other intervertebral disc degeneration, lumbar region without mention of lumbar back pain or lower extremity pain: Secondary | ICD-10-CM | POA: Diagnosis not present

## 2024-11-18 DIAGNOSIS — E785 Hyperlipidemia, unspecified: Secondary | ICD-10-CM | POA: Diagnosis not present

## 2024-11-18 DIAGNOSIS — E1165 Type 2 diabetes mellitus with hyperglycemia: Secondary | ICD-10-CM

## 2024-11-18 DIAGNOSIS — G894 Chronic pain syndrome: Secondary | ICD-10-CM

## 2024-11-18 LAB — CBC WITH DIFFERENTIAL/PLATELET
Basophils Absolute: 0.1 10*3/uL (ref 0.0–0.1)
Basophils Relative: 1 % (ref 0.0–3.0)
Eosinophils Absolute: 0.1 10*3/uL (ref 0.0–0.7)
Eosinophils Relative: 1.3 % (ref 0.0–5.0)
HCT: 40.6 % (ref 36.0–46.0)
Hemoglobin: 13.8 g/dL (ref 12.0–15.0)
Lymphocytes Relative: 27.6 % (ref 12.0–46.0)
Lymphs Abs: 2.2 10*3/uL (ref 0.7–4.0)
MCHC: 33.9 g/dL (ref 30.0–36.0)
MCV: 96 fl (ref 78.0–100.0)
Monocytes Absolute: 0.3 10*3/uL (ref 0.1–1.0)
Monocytes Relative: 4.2 % (ref 3.0–12.0)
Neutro Abs: 5.3 10*3/uL (ref 1.4–7.7)
Neutrophils Relative %: 65.9 % (ref 43.0–77.0)
Platelets: 271 10*3/uL (ref 150.0–400.0)
RBC: 4.23 Mil/uL (ref 3.87–5.11)
RDW: 13.3 % (ref 11.5–15.5)
WBC: 8 10*3/uL (ref 4.0–10.5)

## 2024-11-18 LAB — COMPREHENSIVE METABOLIC PANEL WITH GFR
ALT: 7 U/L (ref 3–35)
AST: 14 U/L (ref 5–37)
Albumin: 4.2 g/dL (ref 3.5–5.2)
Alkaline Phosphatase: 66 U/L (ref 39–117)
BUN: 9 mg/dL (ref 6–23)
CO2: 27 meq/L (ref 19–32)
Calcium: 9.3 mg/dL (ref 8.4–10.5)
Chloride: 104 meq/L (ref 96–112)
Creatinine, Ser: 0.89 mg/dL (ref 0.40–1.20)
GFR: 74.63 mL/min
Glucose, Bld: 85 mg/dL (ref 70–99)
Potassium: 4.5 meq/L (ref 3.5–5.1)
Sodium: 140 meq/L (ref 135–145)
Total Bilirubin: 0.3 mg/dL (ref 0.2–1.2)
Total Protein: 7.1 g/dL (ref 6.0–8.3)

## 2024-11-18 LAB — LIPID PANEL
Cholesterol: 163 mg/dL (ref 28–200)
HDL: 53.9 mg/dL
LDL Cholesterol: 94 mg/dL (ref 10–99)
NonHDL: 108.99
Total CHOL/HDL Ratio: 3
Triglycerides: 75 mg/dL (ref 10.0–149.0)
VLDL: 15 mg/dL (ref 0.0–40.0)

## 2024-11-18 LAB — MICROALBUMIN / CREATININE URINE RATIO
Creatinine,U: 119.8 mg/dL
Microalb Creat Ratio: UNDETERMINED mg/g (ref 0.0–30.0)
Microalb, Ur: <0.7 mg/dL

## 2024-11-18 LAB — HEMOGLOBIN A1C: Hgb A1c MFr Bld: 6.5 % (ref 4.6–6.5)
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
## 2024-11-18 NOTE — Assessment & Plan Note (Signed)
 Uds/ contract utd

## 2024-11-18 NOTE — Patient Instructions (Signed)
Myofascial Pain Syndrome and Fibromyalgia Myofascial pain syndrome and fibromyalgia are both pain disorders. You may feel this pain mainly in your muscles. Myofascial pain syndrome: Always has tender points in the muscles that will cause pain when pressed (trigger points). The pain may come and go. Usually affects your neck, upper back, and shoulder areas. The pain often moves into your arms and hands. Fibromyalgia: Has muscle pains and tenderness that come and go. Is often associated with tiredness (fatigue) and sleep problems. Has trigger points. Tends to be long-lasting (chronic), but is not life-threatening. Fibromyalgia and myofascial pain syndrome are not the same. However, they often occur together. If you have both conditions, each can make the other worse. Both are common and can cause enough pain and fatigue to make day-to-day activities difficult. Both can be hard to diagnose because their symptoms are common in many other conditions. What are the causes? The exact causes of these conditions are not known. What increases the risk? You are more likely to develop either of these conditions if: You have a family history of the condition. You are female. You have certain triggers, such as: Spine disorders. An injury (trauma) or other physical stressors. Being under a lot of stress. Medical conditions such as osteoarthritis, rheumatoid arthritis, or lupus. What are the signs or symptoms? Fibromyalgia The main symptom of fibromyalgia is widespread pain and tenderness in your muscles. Pain is sometimes described as stabbing, shooting, or burning. You may also have: Tingling or numbness. Sleep problems and fatigue. Problems with attention and concentration (fibro fog). Other symptoms may include: Bowel and bladder problems. Headaches. Vision problems. Sensitivity to odors and noises. Depression or mood changes. Painful menstrual periods (dysmenorrhea). Dry skin or eyes. These  symptoms can vary over time. Myofascial pain syndrome Symptoms of myofascial pain syndrome include: Tight, ropy bands of muscle. Uncomfortable sensations in muscle areas. These may include aching, cramping, burning, numbness, tingling, and weakness. Difficulty moving certain parts of the body freely (poor range of motion). How is this diagnosed? This condition may be diagnosed by your symptoms and medical history. You will also have a physical exam. In general: Fibromyalgia is diagnosed if you have pain, fatigue, and other symptoms for more than 3 months, and symptoms cannot be explained by another condition. Myofascial pain syndrome is diagnosed if you have trigger points in your muscles, and those trigger points are tender and cause pain elsewhere in your body (referred pain). How is this treated? Treatment for these conditions depends on the type that you have. For fibromyalgia, a healthy lifestyle is the most important treatment including aerobic and strength exercises. Different types of medicines are used to help treat pain and include: NSAIDs. Medicines for treating depression. Medicines that help control seizures. Medicines that relax the muscles. Treatment for myofascial pain syndrome includes: Pain medicines, such as NSAIDs. Cooling and stretching of muscles. Massage therapy with myofascial release technique. Trigger point injections. Treating these conditions often requires a team of health care providers. These may include: Your primary care provider. A physical therapist. Complementary health care providers, such as massage therapists or acupuncturists. A psychiatrist for cognitive behavioral therapy. Follow these instructions at home: Medicines Take over-the-counter and prescription medicines only as told by your health care provider. Ask your health care provider if the medicine prescribed to you: Requires you to avoid driving or using machinery. Can cause constipation.  You may need to take these actions to prevent or treat constipation: Drink enough fluid to keep your urine pale   yellow. Take over-the-counter or prescription medicines. Eat foods that are high in fiber, such as beans, whole grains, and fresh fruits and vegetables. Limit foods that are high in fat and processed sugars, such as fried or sweet foods. Lifestyle  Do exercises as told by your health care provider or physical therapist. Practice relaxation techniques to control your stress. You may want to try: Biofeedback. Visual imagery. Hypnosis. Muscle relaxation. Yoga. Meditation. Maintain a healthy lifestyle. This includes eating a healthy diet and getting enough sleep. Do not use any products that contain nicotine or tobacco. These products include cigarettes, chewing tobacco, and vaping devices, such as e-cigarettes. If you need help quitting, ask your health care provider. General instructions Talk to your health care provider about complementary treatments, such as acupuncture or massage. Do not do activities that stress or strain your muscles. This includes repetitive motions and heavy lifting. Keep all follow-up visits. This is important. Where to find support Consider joining a support group with others who are diagnosed with this condition. National Fibromyalgia Association: fmaware.org Where to find more information U.S. Pain Foundation: uspainfoundation.org Contact a health care provider if: You have new symptoms. Your symptoms get worse or your pain is severe. You have side effects from your medicines. You have trouble sleeping. Your condition is causing depression or anxiety. Get help right away if: You have thoughts of hurting yourself or others. Get help right away if you feel like you may hurt yourself or others, or have thoughts about taking your own life. Go to your nearest emergency room or: Call 911. Call the National Suicide Prevention Lifeline at 1-800-273-8255  or 988. This is open 24 hours a day. Text the Crisis Text Line at 741741. This information is not intended to replace advice given to you by your health care provider. Make sure you discuss any questions you have with your health care provider. Document Revised: 07/17/2022 Document Reviewed: 09/09/2021 Elsevier Patient Education  2024 Elsevier Inc.  

## 2024-11-18 NOTE — Assessment & Plan Note (Signed)
 Well controlled, no changes to meds. Encouraged heart healthy diet such as the DASH diet and exercise as tolerated.

## 2024-11-18 NOTE — Assessment & Plan Note (Signed)
 hgba1c acceptable, minimize simple carbs. Increase exercise as tolerated. Continue current meds Lab Results  Component Value Date   HGBA1C 6.5 06/19/2024

## 2024-11-19 ENCOUNTER — Ambulatory Visit: Payer: Self-pay | Admitting: Family Medicine

## 2024-11-19 ENCOUNTER — Ambulatory Visit: Payer: Self-pay | Admitting: Surgical

## 2024-11-19 ENCOUNTER — Other Ambulatory Visit: Payer: Self-pay

## 2024-11-19 DIAGNOSIS — M545 Low back pain, unspecified: Secondary | ICD-10-CM

## 2024-11-19 NOTE — Progress Notes (Signed)
 Emily Maddox, I called [name].  Could not get a hold of her.  Left voicemail message detailing she has right sided disc bulge which does correlate with her symptoms.  Can we set her up for lumbar spine ESI with Dr. Eldonna?  I told her to give us  a callback or send me a MyChart message if she has any other concerns or questions.

## 2024-11-28 ENCOUNTER — Telehealth: Payer: Self-pay | Admitting: Family Medicine

## 2024-11-28 NOTE — Telephone Encounter (Signed)
 Copied from CRM #[id number]. Topic: Appointments - Scheduling Inquiry for Clinic >> Nov 28, 2024  3:21 PM Montie POUR wrote: Reason for CRM:  Ms. Wolfson needs a work excuse to be off work for 12/01/24 through 12/15/24 because [employer] will expire on 12/01/24 and it will take a long time to get it approved. She said her doctor knows what she has been through with the paperwork. Please call her at [phone] to discuss. I did speak to the clinic and was told to sent a CRM. Thanks for helping.
